# Patient Record
Sex: Female | Born: 1955 | Race: White | Hispanic: No | Marital: Married | Smoking: Never smoker
Health system: Southern US, Community
[De-identification: ages and names within clinical notes are randomized; demographics above are authoritative.]

## PROBLEM LIST (undated history)

## (undated) DIAGNOSIS — E049 Nontoxic goiter, unspecified: Secondary | ICD-10-CM

## (undated) DIAGNOSIS — K439 Ventral hernia without obstruction or gangrene: Secondary | ICD-10-CM

## (undated) DIAGNOSIS — F419 Anxiety disorder, unspecified: Secondary | ICD-10-CM

## (undated) DIAGNOSIS — M1990 Unspecified osteoarthritis, unspecified site: Secondary | ICD-10-CM

## (undated) DIAGNOSIS — I10 Essential (primary) hypertension: Secondary | ICD-10-CM

## (undated) DIAGNOSIS — C55 Malignant neoplasm of uterus, part unspecified: Secondary | ICD-10-CM

## (undated) DIAGNOSIS — J42 Unspecified chronic bronchitis: Secondary | ICD-10-CM

## (undated) DIAGNOSIS — J189 Pneumonia, unspecified organism: Secondary | ICD-10-CM

## (undated) DIAGNOSIS — Z8739 Personal history of other diseases of the musculoskeletal system and connective tissue: Secondary | ICD-10-CM

## (undated) DIAGNOSIS — Z87442 Personal history of urinary calculi: Secondary | ICD-10-CM

## (undated) DIAGNOSIS — Z862 Personal history of diseases of the blood and blood-forming organs and certain disorders involving the immune mechanism: Secondary | ICD-10-CM

## (undated) DIAGNOSIS — E119 Type 2 diabetes mellitus without complications: Secondary | ICD-10-CM

## (undated) DIAGNOSIS — J45909 Unspecified asthma, uncomplicated: Secondary | ICD-10-CM

## (undated) DIAGNOSIS — E039 Hypothyroidism, unspecified: Secondary | ICD-10-CM

## (undated) DIAGNOSIS — E785 Hyperlipidemia, unspecified: Secondary | ICD-10-CM

## (undated) DIAGNOSIS — I2699 Other pulmonary embolism without acute cor pulmonale: Secondary | ICD-10-CM

## (undated) HISTORY — DX: Anxiety disorder, unspecified: F41.9

## (undated) HISTORY — DX: Hyperlipidemia, unspecified: E78.5

## (undated) HISTORY — PX: TOTAL ABDOMINAL HYSTERECTOMY: SHX209

## (undated) HISTORY — PX: TONSILLECTOMY: SUR1361

## (undated) HISTORY — DX: Essential (primary) hypertension: I10

## (undated) HISTORY — PX: DILATION AND CURETTAGE OF UTERUS: SHX78

---

## 1998-04-02 DIAGNOSIS — C55 Malignant neoplasm of uterus, part unspecified: Secondary | ICD-10-CM

## 1998-04-02 HISTORY — DX: Malignant neoplasm of uterus, part unspecified: C55

## 1998-04-28 ENCOUNTER — Encounter: Admission: RE | Admit: 1998-04-28 | Discharge: 1998-04-28 | Payer: Self-pay | Admitting: Sports Medicine

## 1998-04-28 ENCOUNTER — Ambulatory Visit (HOSPITAL_COMMUNITY): Admission: RE | Admit: 1998-04-28 | Discharge: 1998-04-28 | Payer: Self-pay | Admitting: Sports Medicine

## 1998-05-03 ENCOUNTER — Encounter: Admission: RE | Admit: 1998-05-03 | Discharge: 1998-05-03 | Payer: Self-pay | Admitting: Family Medicine

## 1998-06-06 ENCOUNTER — Encounter: Admission: RE | Admit: 1998-06-06 | Discharge: 1998-09-04 | Payer: Self-pay | Admitting: Sports Medicine

## 1998-07-14 ENCOUNTER — Encounter: Admission: RE | Admit: 1998-07-14 | Discharge: 1998-07-14 | Payer: Self-pay | Admitting: Sports Medicine

## 1998-10-13 ENCOUNTER — Encounter: Admission: RE | Admit: 1998-10-13 | Discharge: 1998-10-13 | Payer: Self-pay | Admitting: Sports Medicine

## 1998-12-08 ENCOUNTER — Encounter: Admission: RE | Admit: 1998-12-08 | Discharge: 1998-12-08 | Payer: Self-pay | Admitting: Sports Medicine

## 2000-01-04 ENCOUNTER — Encounter: Admission: RE | Admit: 2000-01-04 | Discharge: 2000-01-04 | Payer: Self-pay | Admitting: Family Medicine

## 2000-03-02 ENCOUNTER — Encounter: Payer: Self-pay | Admitting: Emergency Medicine

## 2000-03-02 ENCOUNTER — Emergency Department (HOSPITAL_COMMUNITY): Admission: EM | Admit: 2000-03-02 | Discharge: 2000-03-02 | Payer: Self-pay | Admitting: Emergency Medicine

## 2000-06-05 ENCOUNTER — Encounter: Admission: RE | Admit: 2000-06-05 | Discharge: 2000-06-05 | Payer: Self-pay | Admitting: Family Medicine

## 2000-06-07 ENCOUNTER — Encounter: Admission: RE | Admit: 2000-06-07 | Discharge: 2000-06-07 | Payer: Self-pay | Admitting: Family Medicine

## 2000-06-12 ENCOUNTER — Emergency Department (HOSPITAL_COMMUNITY): Admission: EM | Admit: 2000-06-12 | Discharge: 2000-06-12 | Payer: Self-pay | Admitting: Emergency Medicine

## 2000-06-12 ENCOUNTER — Encounter: Payer: Self-pay | Admitting: Emergency Medicine

## 2000-06-14 ENCOUNTER — Encounter: Admission: RE | Admit: 2000-06-14 | Discharge: 2000-06-14 | Payer: Self-pay | Admitting: Family Medicine

## 2000-06-27 ENCOUNTER — Encounter: Admission: RE | Admit: 2000-06-27 | Discharge: 2000-06-27 | Payer: Self-pay | Admitting: Sports Medicine

## 2000-07-09 ENCOUNTER — Encounter: Admission: RE | Admit: 2000-07-09 | Discharge: 2000-07-09 | Payer: Self-pay | Admitting: Family Medicine

## 2000-07-16 ENCOUNTER — Encounter: Admission: RE | Admit: 2000-07-16 | Discharge: 2000-07-16 | Payer: Self-pay | Admitting: Family Medicine

## 2000-07-22 ENCOUNTER — Encounter: Admission: RE | Admit: 2000-07-22 | Discharge: 2000-07-22 | Payer: Self-pay | Admitting: Family Medicine

## 2000-07-31 ENCOUNTER — Encounter (INDEPENDENT_AMBULATORY_CARE_PROVIDER_SITE_OTHER): Payer: Self-pay | Admitting: *Deleted

## 2000-08-01 ENCOUNTER — Encounter: Admission: RE | Admit: 2000-08-01 | Discharge: 2000-08-01 | Payer: Self-pay | Admitting: Sports Medicine

## 2000-08-22 ENCOUNTER — Encounter: Payer: Self-pay | Admitting: Sports Medicine

## 2000-08-22 ENCOUNTER — Encounter: Admission: RE | Admit: 2000-08-22 | Discharge: 2000-08-22 | Payer: Self-pay | Admitting: Sports Medicine

## 2000-09-12 ENCOUNTER — Encounter: Admission: RE | Admit: 2000-09-12 | Discharge: 2000-09-12 | Payer: Self-pay | Admitting: Sports Medicine

## 2000-10-10 ENCOUNTER — Encounter: Admission: RE | Admit: 2000-10-10 | Discharge: 2000-10-10 | Payer: Self-pay | Admitting: Family Medicine

## 2000-11-27 ENCOUNTER — Encounter: Admission: RE | Admit: 2000-11-27 | Discharge: 2000-11-27 | Payer: Self-pay | Admitting: *Deleted

## 2000-12-05 ENCOUNTER — Encounter: Admission: RE | Admit: 2000-12-05 | Discharge: 2000-12-05 | Payer: Self-pay | Admitting: Sports Medicine

## 2001-03-28 ENCOUNTER — Encounter: Admission: RE | Admit: 2001-03-28 | Discharge: 2001-03-28 | Payer: Self-pay | Admitting: Family Medicine

## 2001-05-23 ENCOUNTER — Encounter: Admission: RE | Admit: 2001-05-23 | Discharge: 2001-05-23 | Payer: Self-pay | Admitting: Sports Medicine

## 2001-07-03 ENCOUNTER — Encounter: Admission: RE | Admit: 2001-07-03 | Discharge: 2001-07-03 | Payer: Self-pay | Admitting: Family Medicine

## 2001-07-04 ENCOUNTER — Encounter: Admission: RE | Admit: 2001-07-04 | Discharge: 2001-07-04 | Payer: Self-pay | Admitting: Family Medicine

## 2002-03-11 ENCOUNTER — Encounter: Admission: RE | Admit: 2002-03-11 | Discharge: 2002-03-11 | Payer: Self-pay | Admitting: Family Medicine

## 2002-10-16 ENCOUNTER — Encounter: Admission: RE | Admit: 2002-10-16 | Discharge: 2002-10-16 | Payer: Self-pay | Admitting: Family Medicine

## 2002-11-19 ENCOUNTER — Encounter: Admission: RE | Admit: 2002-11-19 | Discharge: 2002-11-19 | Payer: Self-pay | Admitting: Family Medicine

## 2002-12-03 ENCOUNTER — Encounter: Admission: RE | Admit: 2002-12-03 | Discharge: 2002-12-03 | Payer: Self-pay | Admitting: Sports Medicine

## 2003-01-14 ENCOUNTER — Encounter: Admission: RE | Admit: 2003-01-14 | Discharge: 2003-01-14 | Payer: Self-pay | Admitting: Sports Medicine

## 2003-02-18 ENCOUNTER — Encounter: Admission: RE | Admit: 2003-02-18 | Discharge: 2003-02-18 | Payer: Self-pay | Admitting: Sports Medicine

## 2003-03-18 ENCOUNTER — Encounter: Admission: RE | Admit: 2003-03-18 | Discharge: 2003-03-18 | Payer: Self-pay | Admitting: Sports Medicine

## 2003-04-15 ENCOUNTER — Encounter: Admission: RE | Admit: 2003-04-15 | Discharge: 2003-04-15 | Payer: Self-pay | Admitting: Sports Medicine

## 2003-09-10 ENCOUNTER — Encounter: Admission: RE | Admit: 2003-09-10 | Discharge: 2003-09-10 | Payer: Self-pay | Admitting: Sports Medicine

## 2003-09-30 ENCOUNTER — Encounter: Admission: RE | Admit: 2003-09-30 | Discharge: 2003-09-30 | Payer: Self-pay | Admitting: Sports Medicine

## 2003-11-04 ENCOUNTER — Encounter: Admission: RE | Admit: 2003-11-04 | Discharge: 2003-11-04 | Payer: Self-pay | Admitting: Sports Medicine

## 2004-01-06 ENCOUNTER — Ambulatory Visit: Payer: Self-pay | Admitting: Sports Medicine

## 2004-04-06 ENCOUNTER — Emergency Department (HOSPITAL_COMMUNITY): Admission: EM | Admit: 2004-04-06 | Discharge: 2004-04-06 | Payer: Self-pay | Admitting: Emergency Medicine

## 2004-04-13 ENCOUNTER — Ambulatory Visit: Payer: Self-pay | Admitting: Sports Medicine

## 2004-05-04 ENCOUNTER — Ambulatory Visit: Payer: Self-pay | Admitting: Sports Medicine

## 2005-02-15 ENCOUNTER — Ambulatory Visit: Payer: Self-pay | Admitting: Sports Medicine

## 2005-03-01 ENCOUNTER — Ambulatory Visit: Payer: Self-pay | Admitting: Sports Medicine

## 2005-06-27 ENCOUNTER — Emergency Department (HOSPITAL_COMMUNITY): Admission: EM | Admit: 2005-06-27 | Discharge: 2005-06-28 | Payer: Self-pay | Admitting: Emergency Medicine

## 2005-06-29 ENCOUNTER — Ambulatory Visit: Payer: Self-pay | Admitting: Family Medicine

## 2005-07-11 ENCOUNTER — Encounter (INDEPENDENT_AMBULATORY_CARE_PROVIDER_SITE_OTHER): Payer: Self-pay | Admitting: *Deleted

## 2006-01-17 ENCOUNTER — Encounter (INDEPENDENT_AMBULATORY_CARE_PROVIDER_SITE_OTHER): Payer: Self-pay | Admitting: *Deleted

## 2006-04-05 ENCOUNTER — Encounter (INDEPENDENT_AMBULATORY_CARE_PROVIDER_SITE_OTHER): Payer: Self-pay | Admitting: *Deleted

## 2006-04-05 DIAGNOSIS — M545 Low back pain, unspecified: Secondary | ICD-10-CM | POA: Insufficient documentation

## 2006-04-05 DIAGNOSIS — K219 Gastro-esophageal reflux disease without esophagitis: Secondary | ICD-10-CM | POA: Insufficient documentation

## 2006-04-11 ENCOUNTER — Ambulatory Visit: Payer: Self-pay | Admitting: Sports Medicine

## 2006-04-11 LAB — CONVERTED CEMR LAB
Albumin: 4.7 g/dL (ref 3.5–5.2)
CO2: 24 meq/L (ref 19–32)
Cholesterol: 163 mg/dL (ref 0–200)
Glucose, Bld: 224 mg/dL — ABNORMAL HIGH (ref 70–99)
Potassium: 4.3 meq/L (ref 3.5–5.3)
Sodium: 139 meq/L (ref 135–145)
Total Protein: 7.7 g/dL (ref 6.0–8.3)
Triglycerides: 195 mg/dL — ABNORMAL HIGH (ref <150)

## 2006-05-30 DIAGNOSIS — E049 Nontoxic goiter, unspecified: Secondary | ICD-10-CM | POA: Insufficient documentation

## 2006-05-30 DIAGNOSIS — I10 Essential (primary) hypertension: Secondary | ICD-10-CM

## 2006-05-30 DIAGNOSIS — E118 Type 2 diabetes mellitus with unspecified complications: Secondary | ICD-10-CM | POA: Insufficient documentation

## 2006-05-30 DIAGNOSIS — F329 Major depressive disorder, single episode, unspecified: Secondary | ICD-10-CM | POA: Insufficient documentation

## 2006-05-30 DIAGNOSIS — E1169 Type 2 diabetes mellitus with other specified complication: Secondary | ICD-10-CM | POA: Insufficient documentation

## 2006-05-30 DIAGNOSIS — F3289 Other specified depressive episodes: Secondary | ICD-10-CM | POA: Insufficient documentation

## 2006-05-30 DIAGNOSIS — E785 Hyperlipidemia, unspecified: Secondary | ICD-10-CM | POA: Insufficient documentation

## 2006-05-30 DIAGNOSIS — F411 Generalized anxiety disorder: Secondary | ICD-10-CM

## 2006-05-31 ENCOUNTER — Encounter (INDEPENDENT_AMBULATORY_CARE_PROVIDER_SITE_OTHER): Payer: Self-pay | Admitting: *Deleted

## 2006-06-01 ENCOUNTER — Emergency Department (HOSPITAL_COMMUNITY): Admission: EM | Admit: 2006-06-01 | Discharge: 2006-06-01 | Payer: Self-pay | Admitting: Family Medicine

## 2006-06-04 ENCOUNTER — Ambulatory Visit: Payer: Self-pay | Admitting: Family Medicine

## 2006-06-04 ENCOUNTER — Telehealth (INDEPENDENT_AMBULATORY_CARE_PROVIDER_SITE_OTHER): Payer: Self-pay | Admitting: *Deleted

## 2006-07-11 ENCOUNTER — Ambulatory Visit: Payer: Self-pay | Admitting: Sports Medicine

## 2006-08-22 ENCOUNTER — Ambulatory Visit: Payer: Self-pay | Admitting: Sports Medicine

## 2006-08-22 LAB — CONVERTED CEMR LAB: Hgb A1c MFr Bld: 8.7 %

## 2007-05-12 ENCOUNTER — Encounter: Payer: Self-pay | Admitting: *Deleted

## 2007-05-23 ENCOUNTER — Emergency Department (HOSPITAL_COMMUNITY): Admission: EM | Admit: 2007-05-23 | Discharge: 2007-05-23 | Payer: Self-pay | Admitting: Emergency Medicine

## 2007-05-29 ENCOUNTER — Ambulatory Visit: Payer: Self-pay | Admitting: Sports Medicine

## 2007-05-30 ENCOUNTER — Telehealth (INDEPENDENT_AMBULATORY_CARE_PROVIDER_SITE_OTHER): Payer: Self-pay | Admitting: *Deleted

## 2007-06-26 ENCOUNTER — Ambulatory Visit: Payer: Self-pay | Admitting: Sports Medicine

## 2007-06-26 LAB — CONVERTED CEMR LAB
ALT: 25 U/L (ref 0–35)
AST: 27 U/L (ref 0–37)
Albumin: 4.7 g/dL (ref 3.5–5.2)
Alkaline Phosphatase: 55 U/L (ref 39–117)
BUN: 13 mg/dL (ref 6–23)
CO2: 23 meq/L (ref 19–32)
Calcium: 9.3 mg/dL (ref 8.4–10.5)
Chloride: 98 meq/L (ref 96–112)
Cholesterol: 141 mg/dL (ref 0–200)
Creatinine, Ser: 0.91 mg/dL (ref 0.40–1.20)
Glucose, Bld: 231 mg/dL — ABNORMAL HIGH (ref 70–99)
HDL: 46 mg/dL (ref >39)
Hgb A1c MFr Bld: 9.2 %
LDL Cholesterol: 58 mg/dL (ref 0–99)
Potassium: 4.4 meq/L (ref 3.5–5.3)
Sodium: 137 meq/L (ref 135–145)
TSH: 0.894 u[IU]/mL (ref 0.350–5.50)
Total Bilirubin: 0.7 mg/dL (ref 0.3–1.2)
Total CHOL/HDL Ratio: 3.1
Total Protein: 7.3 g/dL (ref 6.0–8.3)
Triglycerides: 183 mg/dL — ABNORMAL HIGH (ref <150)
VLDL: 37 mg/dL (ref 0–40)

## 2007-08-12 ENCOUNTER — Encounter: Payer: Self-pay | Admitting: *Deleted

## 2007-10-06 ENCOUNTER — Telehealth: Payer: Self-pay | Admitting: *Deleted

## 2007-10-06 ENCOUNTER — Ambulatory Visit: Payer: Self-pay | Admitting: Family Medicine

## 2007-10-16 ENCOUNTER — Ambulatory Visit: Payer: Self-pay | Admitting: Sports Medicine

## 2007-10-16 LAB — CONVERTED CEMR LAB
Bilirubin Urine: NEGATIVE
Hgb A1c MFr Bld: 8.6 %
Urobilinogen, UA: 0.2
pH: 7

## 2007-10-17 ENCOUNTER — Telehealth: Payer: Self-pay | Admitting: *Deleted

## 2007-12-11 ENCOUNTER — Ambulatory Visit: Payer: Self-pay | Admitting: Sports Medicine

## 2008-03-09 ENCOUNTER — Ambulatory Visit: Payer: Self-pay | Admitting: Family Medicine

## 2008-06-10 ENCOUNTER — Ambulatory Visit: Payer: Self-pay | Admitting: Family Medicine

## 2008-06-10 LAB — CONVERTED CEMR LAB
Bilirubin Urine: NEGATIVE
Blood in Urine, dipstick: NEGATIVE
Glucose, Urine, Semiquant: NEGATIVE
Protein, U semiquant: NEGATIVE
Specific Gravity, Urine: 1.02
WBC, UA: >20 cells/hpf
pH: 5.5

## 2008-06-30 ENCOUNTER — Encounter: Payer: Self-pay | Admitting: Family Medicine

## 2008-07-16 ENCOUNTER — Ambulatory Visit: Payer: Self-pay | Admitting: Family Medicine

## 2008-07-16 ENCOUNTER — Encounter: Payer: Self-pay | Admitting: Family Medicine

## 2008-07-16 LAB — CONVERTED CEMR LAB
BUN: 23 mg/dL (ref 6–23)
Chloride: 100 meq/L (ref 96–112)
Creatinine, Ser: 1.12 mg/dL (ref 0.40–1.20)
Glucose, Bld: 67 mg/dL — ABNORMAL LOW (ref 70–99)
LDL Cholesterol: 90 mg/dL (ref 0–99)
TSH: 0.919 microintl units/mL (ref 0.350–4.500)
Triglycerides: 118 mg/dL (ref <150)

## 2008-07-20 ENCOUNTER — Encounter: Payer: Self-pay | Admitting: Family Medicine

## 2008-07-23 ENCOUNTER — Encounter: Payer: Self-pay | Admitting: Family Medicine

## 2008-07-23 ENCOUNTER — Ambulatory Visit: Payer: Self-pay | Admitting: Family Medicine

## 2008-07-23 ENCOUNTER — Telehealth: Payer: Self-pay | Admitting: *Deleted

## 2008-07-23 LAB — CONVERTED CEMR LAB
Bilirubin Urine: NEGATIVE
Glucose, Urine, Semiquant: NEGATIVE
Ketones, urine, test strip: NEGATIVE
Specific Gravity, Urine: 1.015
pH: 5.5

## 2008-08-06 ENCOUNTER — Encounter: Payer: Self-pay | Admitting: Family Medicine

## 2008-08-20 ENCOUNTER — Encounter: Payer: Self-pay | Admitting: Family Medicine

## 2008-10-01 ENCOUNTER — Ambulatory Visit: Payer: Self-pay | Admitting: Family Medicine

## 2008-10-01 ENCOUNTER — Telehealth: Payer: Self-pay | Admitting: Family Medicine

## 2008-10-02 ENCOUNTER — Ambulatory Visit: Payer: Self-pay | Admitting: Family Medicine

## 2008-10-02 ENCOUNTER — Inpatient Hospital Stay (HOSPITAL_COMMUNITY): Admission: EM | Admit: 2008-10-02 | Discharge: 2008-10-08 | Payer: Self-pay | Admitting: Emergency Medicine

## 2008-10-02 ENCOUNTER — Encounter: Payer: Self-pay | Admitting: Family Medicine

## 2008-10-02 DIAGNOSIS — I2782 Chronic pulmonary embolism: Secondary | ICD-10-CM | POA: Insufficient documentation

## 2008-10-08 ENCOUNTER — Encounter: Payer: Self-pay | Admitting: Family Medicine

## 2008-10-09 ENCOUNTER — Telehealth: Payer: Self-pay | Admitting: Sports Medicine

## 2008-10-10 ENCOUNTER — Emergency Department (HOSPITAL_COMMUNITY): Admission: EM | Admit: 2008-10-10 | Discharge: 2008-10-10 | Payer: Self-pay | Admitting: Emergency Medicine

## 2008-10-11 ENCOUNTER — Ambulatory Visit: Payer: Self-pay | Admitting: Family Medicine

## 2008-10-11 LAB — CONVERTED CEMR LAB

## 2008-10-14 ENCOUNTER — Ambulatory Visit: Payer: Self-pay | Admitting: Family Medicine

## 2008-10-14 ENCOUNTER — Telehealth: Payer: Self-pay | Admitting: Family Medicine

## 2008-10-14 ENCOUNTER — Encounter: Payer: Self-pay | Admitting: Family Medicine

## 2008-10-14 LAB — CONVERTED CEMR LAB
CO2: 25 meq/L (ref 19–32)
Chloride: 103 meq/L (ref 96–112)
INR: 3.1
Sodium: 139 meq/L (ref 135–145)

## 2008-10-18 ENCOUNTER — Ambulatory Visit: Payer: Self-pay | Admitting: Family Medicine

## 2008-10-25 ENCOUNTER — Ambulatory Visit: Payer: Self-pay | Admitting: Family Medicine

## 2008-10-25 LAB — CONVERTED CEMR LAB: INR: 2.6

## 2008-11-08 ENCOUNTER — Ambulatory Visit: Payer: Self-pay | Admitting: Family Medicine

## 2008-11-08 LAB — CONVERTED CEMR LAB: INR: 2.3

## 2008-11-15 ENCOUNTER — Encounter: Payer: Self-pay | Admitting: Family Medicine

## 2008-11-15 ENCOUNTER — Ambulatory Visit: Payer: Self-pay | Admitting: Family Medicine

## 2008-11-15 LAB — CONVERTED CEMR LAB: TSH: 0.043 microintl units/mL — ABNORMAL LOW (ref 0.350–4.500)

## 2008-11-19 ENCOUNTER — Encounter: Admission: RE | Admit: 2008-11-19 | Discharge: 2008-11-19 | Payer: Self-pay | Admitting: Family Medicine

## 2008-11-19 ENCOUNTER — Encounter: Payer: Self-pay | Admitting: Family Medicine

## 2008-11-19 ENCOUNTER — Telehealth: Payer: Self-pay | Admitting: Family Medicine

## 2008-11-22 ENCOUNTER — Telehealth: Payer: Self-pay | Admitting: Family Medicine

## 2008-11-23 ENCOUNTER — Telehealth (INDEPENDENT_AMBULATORY_CARE_PROVIDER_SITE_OTHER): Payer: Self-pay | Admitting: *Deleted

## 2008-11-26 ENCOUNTER — Encounter: Payer: Self-pay | Admitting: Family Medicine

## 2008-11-26 ENCOUNTER — Telehealth: Payer: Self-pay | Admitting: Family Medicine

## 2008-11-26 ENCOUNTER — Ambulatory Visit: Payer: Self-pay | Admitting: Family Medicine

## 2008-11-26 LAB — CONVERTED CEMR LAB
Free T4: 0.9 ng/dL (ref 0.80–1.80)
T3, Free: 2.7 pg/mL (ref 2.3–4.2)

## 2008-11-29 ENCOUNTER — Telehealth: Payer: Self-pay | Admitting: Family Medicine

## 2008-11-29 ENCOUNTER — Telehealth: Payer: Self-pay | Admitting: *Deleted

## 2008-12-07 ENCOUNTER — Encounter: Payer: Self-pay | Admitting: Family Medicine

## 2008-12-07 ENCOUNTER — Encounter (INDEPENDENT_AMBULATORY_CARE_PROVIDER_SITE_OTHER): Payer: Self-pay | Admitting: Interventional Radiology

## 2008-12-07 ENCOUNTER — Encounter: Admission: RE | Admit: 2008-12-07 | Discharge: 2008-12-07 | Payer: Self-pay | Admitting: Family Medicine

## 2008-12-07 ENCOUNTER — Other Ambulatory Visit: Admission: RE | Admit: 2008-12-07 | Discharge: 2008-12-07 | Payer: Self-pay | Admitting: Interventional Radiology

## 2008-12-10 ENCOUNTER — Ambulatory Visit: Payer: Self-pay | Admitting: Family Medicine

## 2008-12-13 ENCOUNTER — Encounter (HOSPITAL_COMMUNITY): Admission: RE | Admit: 2008-12-13 | Discharge: 2009-02-04 | Payer: Self-pay | Admitting: Family Medicine

## 2008-12-20 ENCOUNTER — Encounter: Payer: Self-pay | Admitting: Family Medicine

## 2008-12-24 ENCOUNTER — Encounter: Payer: Self-pay | Admitting: Family Medicine

## 2008-12-24 ENCOUNTER — Ambulatory Visit: Payer: Self-pay | Admitting: Family Medicine

## 2008-12-24 LAB — CONVERTED CEMR LAB
AST: 20 units/L (ref 0–37)
Albumin: 4.7 g/dL (ref 3.5–5.2)
Alkaline Phosphatase: 56 units/L (ref 39–117)
BUN: 16 mg/dL (ref 6–23)
Calcium: 9.7 mg/dL (ref 8.4–10.5)
Chloride: 102 meq/L (ref 96–112)
Creatinine, Ser: 1.11 mg/dL (ref 0.40–1.20)
Glucose, Bld: 102 mg/dL — ABNORMAL HIGH (ref 70–99)
Potassium: 4.4 meq/L (ref 3.5–5.3)

## 2008-12-25 ENCOUNTER — Encounter: Payer: Self-pay | Admitting: Family Medicine

## 2009-01-07 ENCOUNTER — Encounter: Payer: Self-pay | Admitting: Family Medicine

## 2009-01-07 ENCOUNTER — Encounter: Admission: RE | Admit: 2009-01-07 | Discharge: 2009-01-07 | Payer: Self-pay | Admitting: Family Medicine

## 2009-01-14 ENCOUNTER — Ambulatory Visit: Payer: Self-pay | Admitting: Family Medicine

## 2009-01-14 LAB — CONVERTED CEMR LAB: INR: 2.2

## 2009-02-04 ENCOUNTER — Ambulatory Visit: Payer: Self-pay | Admitting: Family Medicine

## 2009-02-04 LAB — CONVERTED CEMR LAB: INR: 2.1

## 2009-03-04 ENCOUNTER — Ambulatory Visit: Payer: Self-pay | Admitting: Family Medicine

## 2009-03-04 LAB — CONVERTED CEMR LAB: INR: 2.6

## 2009-03-15 ENCOUNTER — Encounter: Payer: Self-pay | Admitting: Family Medicine

## 2009-03-15 ENCOUNTER — Ambulatory Visit: Payer: Self-pay | Admitting: Family Medicine

## 2009-03-17 ENCOUNTER — Telehealth: Payer: Self-pay | Admitting: Family Medicine

## 2009-03-18 ENCOUNTER — Encounter: Payer: Self-pay | Admitting: Family Medicine

## 2009-03-24 ENCOUNTER — Ambulatory Visit: Payer: Self-pay | Admitting: Family Medicine

## 2009-03-31 ENCOUNTER — Ambulatory Visit: Payer: Self-pay

## 2009-04-14 ENCOUNTER — Ambulatory Visit: Payer: Self-pay | Admitting: Family Medicine

## 2009-04-14 DIAGNOSIS — M25569 Pain in unspecified knee: Secondary | ICD-10-CM | POA: Insufficient documentation

## 2009-04-14 LAB — CONVERTED CEMR LAB
BUN: 14 mg/dL (ref 6–23)
Chloride: 101 meq/L (ref 96–112)
Creatinine, Ser: 1.02 mg/dL (ref 0.40–1.20)
Glucose, Bld: 152 mg/dL — ABNORMAL HIGH (ref 70–99)
INR: 2
Potassium: 4.3 meq/L (ref 3.5–5.3)

## 2009-04-18 ENCOUNTER — Encounter: Payer: Self-pay | Admitting: Family Medicine

## 2009-04-28 ENCOUNTER — Ambulatory Visit: Payer: Self-pay | Admitting: Family Medicine

## 2009-04-28 LAB — CONVERTED CEMR LAB: INR: 2.7

## 2009-05-19 ENCOUNTER — Ambulatory Visit: Payer: Self-pay | Admitting: Family Medicine

## 2009-05-19 DIAGNOSIS — C55 Malignant neoplasm of uterus, part unspecified: Secondary | ICD-10-CM

## 2009-05-19 LAB — CONVERTED CEMR LAB
Glucose, Urine, Semiquant: NEGATIVE
Protein, U semiquant: NEGATIVE
Specific Gravity, Urine: 1.02
Urobilinogen, UA: 0.2
Whiff Test: NEGATIVE
pH: 5.5

## 2009-05-23 ENCOUNTER — Encounter: Payer: Self-pay | Admitting: Family Medicine

## 2009-05-23 ENCOUNTER — Telehealth: Payer: Self-pay | Admitting: Family Medicine

## 2009-06-16 ENCOUNTER — Ambulatory Visit: Payer: Self-pay | Admitting: Family Medicine

## 2009-06-16 ENCOUNTER — Encounter: Payer: Self-pay | Admitting: Family Medicine

## 2009-07-14 ENCOUNTER — Ambulatory Visit: Payer: Self-pay | Admitting: Family Medicine

## 2009-08-01 ENCOUNTER — Telehealth: Payer: Self-pay | Admitting: Family Medicine

## 2009-08-11 ENCOUNTER — Ambulatory Visit: Payer: Self-pay | Admitting: Family Medicine

## 2009-08-11 LAB — CONVERTED CEMR LAB: INR: 2.7

## 2009-08-25 ENCOUNTER — Ambulatory Visit: Payer: Self-pay | Admitting: Family Medicine

## 2009-08-25 ENCOUNTER — Encounter: Payer: Self-pay | Admitting: Family Medicine

## 2009-08-25 ENCOUNTER — Telehealth: Payer: Self-pay | Admitting: Family Medicine

## 2009-08-25 DIAGNOSIS — R809 Proteinuria, unspecified: Secondary | ICD-10-CM | POA: Insufficient documentation

## 2009-08-25 LAB — CONVERTED CEMR LAB
ALT: 15 units/L (ref 0–35)
Albumin: 4.5 g/dL (ref 3.5–5.2)
Alkaline Phosphatase: 51 units/L (ref 39–117)
CO2: 25 meq/L (ref 19–32)
MCHC: 32.8 g/dL (ref 30.0–36.0)
MCV: 88.8 fL (ref 78.0–100.0)
Nitrite: POSITIVE
Platelets: 234 10*3/uL (ref 150–400)
Potassium: 4.2 meq/L (ref 3.5–5.3)
Protein, U semiquant: >=300
Sodium: 137 meq/L (ref 135–145)
Specific Gravity, Urine: 1.02
Total Bilirubin: 0.5 mg/dL (ref 0.3–1.2)
Total Protein: 7.2 g/dL (ref 6.0–8.3)
WBC Urine, dipstick: NEGATIVE
WBC: 7.3 10*3/uL (ref 4.0–10.5)

## 2009-08-26 ENCOUNTER — Telehealth: Payer: Self-pay | Admitting: *Deleted

## 2009-08-26 ENCOUNTER — Encounter: Payer: Self-pay | Admitting: Family Medicine

## 2009-08-26 ENCOUNTER — Encounter: Admission: RE | Admit: 2009-08-26 | Discharge: 2009-08-26 | Payer: Self-pay | Admitting: Family Medicine

## 2009-08-28 ENCOUNTER — Telehealth: Payer: Self-pay | Admitting: Family Medicine

## 2009-09-22 ENCOUNTER — Ambulatory Visit: Payer: Self-pay | Admitting: Family Medicine

## 2009-09-22 ENCOUNTER — Encounter: Payer: Self-pay | Admitting: Family Medicine

## 2009-09-22 LAB — CONVERTED CEMR LAB
Nitrite: NEGATIVE
Protein, U semiquant: NEGATIVE

## 2009-10-14 ENCOUNTER — Ambulatory Visit: Payer: Self-pay | Admitting: Family Medicine

## 2009-10-14 ENCOUNTER — Encounter: Payer: Self-pay | Admitting: Family Medicine

## 2009-10-14 LAB — CONVERTED CEMR LAB
Glucose, Urine, Semiquant: 250
Specific Gravity, Urine: 1.02
pH: 5

## 2009-10-26 ENCOUNTER — Encounter: Payer: Self-pay | Admitting: Family Medicine

## 2009-10-26 ENCOUNTER — Telehealth: Payer: Self-pay | Admitting: Family Medicine

## 2009-10-26 ENCOUNTER — Ambulatory Visit: Payer: Self-pay | Admitting: Family Medicine

## 2009-10-26 DIAGNOSIS — D649 Anemia, unspecified: Secondary | ICD-10-CM

## 2009-10-31 ENCOUNTER — Ambulatory Visit (HOSPITAL_COMMUNITY): Admission: RE | Admit: 2009-10-31 | Discharge: 2009-10-31 | Payer: Self-pay | Admitting: Family Medicine

## 2009-10-31 ENCOUNTER — Telehealth: Payer: Self-pay | Admitting: Family Medicine

## 2009-11-03 ENCOUNTER — Ambulatory Visit: Payer: Self-pay | Admitting: Family Medicine

## 2009-11-03 DIAGNOSIS — R319 Hematuria, unspecified: Secondary | ICD-10-CM

## 2009-11-03 LAB — CONVERTED CEMR LAB
Bilirubin Urine: NEGATIVE
Hgb A1c MFr Bld: 5.8 %
Protein, U semiquant: 100
Urobilinogen, UA: 0.2
WBC, UA: >20 cells/hpf

## 2009-11-08 ENCOUNTER — Encounter: Payer: Self-pay | Admitting: Family Medicine

## 2009-11-10 ENCOUNTER — Encounter: Payer: Self-pay | Admitting: Family Medicine

## 2009-11-10 ENCOUNTER — Ambulatory Visit: Payer: Self-pay | Admitting: Family Medicine

## 2009-11-10 LAB — CONVERTED CEMR LAB
Cholesterol: 134 mg/dL (ref 0–200)
LDL Cholesterol: 65 mg/dL (ref 0–99)
VLDL: 27 mg/dL (ref 0–40)

## 2009-11-24 ENCOUNTER — Ambulatory Visit: Payer: Self-pay | Admitting: Family Medicine

## 2009-11-24 LAB — CONVERTED CEMR LAB: INR: 2.6

## 2009-12-08 ENCOUNTER — Ambulatory Visit: Payer: Self-pay | Admitting: Family Medicine

## 2009-12-08 LAB — CONVERTED CEMR LAB: INR: 2.4

## 2009-12-23 ENCOUNTER — Ambulatory Visit: Payer: Self-pay | Admitting: Family Medicine

## 2009-12-28 ENCOUNTER — Ambulatory Visit: Payer: Self-pay | Admitting: Obstetrics and Gynecology

## 2009-12-28 ENCOUNTER — Other Ambulatory Visit: Admission: RE | Admit: 2009-12-28 | Discharge: 2009-12-28 | Payer: Self-pay | Admitting: Obstetrics and Gynecology

## 2010-01-03 ENCOUNTER — Encounter: Payer: Self-pay | Admitting: Family Medicine

## 2010-01-03 DIAGNOSIS — J45909 Unspecified asthma, uncomplicated: Secondary | ICD-10-CM | POA: Insufficient documentation

## 2010-01-13 ENCOUNTER — Ambulatory Visit: Payer: Self-pay | Admitting: Family Medicine

## 2010-01-16 ENCOUNTER — Telehealth: Payer: Self-pay | Admitting: Family Medicine

## 2010-01-17 ENCOUNTER — Telehealth: Payer: Self-pay | Admitting: Family Medicine

## 2010-01-17 ENCOUNTER — Ambulatory Visit: Payer: Self-pay | Admitting: Family Medicine

## 2010-01-17 LAB — CONVERTED CEMR LAB
Bilirubin Urine: NEGATIVE
Nitrite: NEGATIVE
Urobilinogen, UA: 0.2
Whiff Test: NEGATIVE

## 2010-01-18 ENCOUNTER — Encounter: Payer: Self-pay | Admitting: Family Medicine

## 2010-01-20 ENCOUNTER — Ambulatory Visit: Payer: Self-pay | Admitting: Family Medicine

## 2010-01-20 LAB — CONVERTED CEMR LAB: INR: 3.4

## 2010-01-21 ENCOUNTER — Emergency Department (HOSPITAL_COMMUNITY): Admission: EM | Admit: 2010-01-21 | Discharge: 2010-01-22 | Payer: Self-pay | Admitting: Emergency Medicine

## 2010-01-26 ENCOUNTER — Ambulatory Visit: Admission: RE | Admit: 2010-01-26 | Discharge: 2010-01-26 | Payer: Self-pay | Admitting: Gynecologic Oncology

## 2010-01-26 ENCOUNTER — Ambulatory Visit: Payer: Self-pay | Admitting: Family Medicine

## 2010-01-26 LAB — CONVERTED CEMR LAB: INR: 2.8

## 2010-01-30 ENCOUNTER — Telehealth: Payer: Self-pay | Admitting: *Deleted

## 2010-02-14 ENCOUNTER — Encounter: Payer: Self-pay | Admitting: Family Medicine

## 2010-02-14 ENCOUNTER — Ambulatory Visit: Payer: Self-pay | Admitting: Family Medicine

## 2010-02-14 ENCOUNTER — Ambulatory Visit (HOSPITAL_COMMUNITY)
Admission: RE | Admit: 2010-02-14 | Discharge: 2010-02-14 | Payer: Self-pay | Source: Home / Self Care | Admitting: Gynecologic Oncology

## 2010-02-14 LAB — CONVERTED CEMR LAB: INR: 1

## 2010-02-15 ENCOUNTER — Encounter: Payer: Self-pay | Admitting: Family Medicine

## 2010-02-21 ENCOUNTER — Ambulatory Visit: Payer: Self-pay | Admitting: Family Medicine

## 2010-03-07 ENCOUNTER — Ambulatory Visit: Payer: Self-pay | Admitting: Family Medicine

## 2010-03-13 ENCOUNTER — Encounter: Payer: Self-pay | Admitting: Family Medicine

## 2010-03-22 ENCOUNTER — Ambulatory Visit
Admission: RE | Admit: 2010-03-22 | Discharge: 2010-03-22 | Payer: Self-pay | Source: Home / Self Care | Attending: Gynecologic Oncology | Admitting: Gynecologic Oncology

## 2010-03-28 ENCOUNTER — Ambulatory Visit: Payer: Self-pay | Admitting: Family Medicine

## 2010-04-02 HISTORY — PX: TOTAL THYROIDECTOMY: SHX2547

## 2010-04-27 ENCOUNTER — Ambulatory Visit
Admission: RE | Admit: 2010-04-27 | Discharge: 2010-04-27 | Payer: Self-pay | Source: Home / Self Care | Attending: Family Medicine | Admitting: Family Medicine

## 2010-04-27 DIAGNOSIS — N3000 Acute cystitis without hematuria: Secondary | ICD-10-CM | POA: Insufficient documentation

## 2010-04-27 LAB — CBC
HCT: 39.6 % (ref 36.0–46.0)
Hemoglobin: 12.7 g/dL (ref 12.0–15.0)
MCV: 88 fL (ref 78.0–100.0)
Platelets: 256 10*3/uL (ref 150–400)
RBC: 4.5 MIL/uL (ref 3.87–5.11)
WBC: 8.8 10*3/uL (ref 4.0–10.5)

## 2010-04-27 LAB — URINALYSIS, ROUTINE W REFLEX MICROSCOPIC
Protein, ur: NEGATIVE mg/dL
Specific Gravity, Urine: 1.016 (ref 1.005–1.030)
Urine Glucose, Fasting: NEGATIVE mg/dL

## 2010-04-27 LAB — DIFFERENTIAL
Eosinophils Absolute: 0.2 10*3/uL (ref 0.0–0.7)
Lymphocytes Relative: 35 % (ref 12–46)
Lymphs Abs: 3 10*3/uL (ref 0.7–4.0)
Monocytes Relative: 6 % (ref 3–12)
Neutro Abs: 5 10*3/uL (ref 1.7–7.7)
Neutrophils Relative %: 57 % (ref 43–77)

## 2010-04-27 LAB — URINE MICROSCOPIC-ADD ON

## 2010-04-27 LAB — COMPREHENSIVE METABOLIC PANEL
ALT: 62 U/L — ABNORMAL HIGH (ref 0–35)
BUN: 11 mg/dL (ref 6–23)
Calcium: 9.4 mg/dL (ref 8.4–10.5)
Glucose, Bld: 83 mg/dL (ref 70–99)
Sodium: 139 mEq/L (ref 135–145)
Total Protein: 6.9 g/dL (ref 6.0–8.3)

## 2010-04-27 LAB — SURGICAL PCR SCREEN
MRSA, PCR: NEGATIVE
Staphylococcus aureus: NEGATIVE

## 2010-04-28 LAB — URINE CULTURE: Culture  Setup Time: 201201261420

## 2010-05-04 ENCOUNTER — Other Ambulatory Visit: Payer: Self-pay
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
## 2010-05-04 NOTE — Miscellaneous (Signed)
Summary: glyburide 2.5  Clinical Lists Changes  Medications: Added new medication of GLYBURIDE 2.5 MG TABS (GLYBURIDE) Take 1 by mouth two times a day for diabetes - Signed Rx of GLYBURIDE 2.5 MG TABS (GLYBURIDE) Take 1 by mouth two times a day for diabetes;  #120 x 3;  Signed;  Entered by: Donna Snooks [place]aziland MD;  Authorized by: Leyton Brownlee [place]iland MD;  Method used: Electronically to [facility]  [phone], [place], [place], [place]  [address], Ph: [phone], Fax: [phone]    Prescriptions: GLYBURIDE 2.5 MG TABS (GLYBURIDE) Take 1 by mouth two times a day for diabetes  #120 x 3   Entered and Authorized by:   Analei Whinery [place] MD   Signed by:   Hang Ammon [place] MD on 06/16/2009   Method used:   Electronically to        [facility] (retail)       [address], [address]       [place], [place]  [address]       Ph: [phone]       Fax: [phone]   RxID:   [id number]

## 2010-05-04 NOTE — Miscellaneous (Signed)
Summary: uti? vag bleeding  Clinical Lists Changes burning & itching x 2 weeks.  hx of bladder infections. (here for labs) Has been taking tylenol for cramps spotting blood. last period years ago. still has uterus. last pap was normal per pt. work in with colpo clinic md this am..[facility] RN  May 19, 2009 9:15 AM

## 2010-05-04 NOTE — Miscellaneous (Signed)
Summary: enoxaparin rx  Clinical Lists Changes  Medications: Added new medication of LOVENOX 120 MG/0.8ML SOLN (ENOXAPARIN SODIUM) 120 mg subq two times a day for anticoagulation - Signed Rx of LOVENOX 120 MG/0.8ML SOLN (ENOXAPARIN SODIUM) 120 mg subq two times a day for anticoagulation;  #60 x 1;  Signed;  Entered by: Sarah Swaziland MD;  Authorized by: Sarah [place]iland MD;  Method used: Electronically to [facility]  [phone], [place], [place], [place]  [address], Ph: [phone], Fax: [phone]    Prescriptions: LOVENOX 120 MG/0.8ML SOLN (ENOXAPARIN SODIUM) 120 mg subq two times a day for anticoagulation  #60 x 1   Entered and Authorized by:   Sarah [place]and MD   Signed by:   Sarah Swaziland MD on 02/14/2010   Method used:   Electronically to        [facility]  [address] (retail)       [address], [address]       [place], [place]  [address]       Ph: [phone]       Fax: [phone]   RxID:   [id number]

## 2010-05-04 NOTE — Progress Notes (Signed)
Summary: triage  Phone Note Call from Patient Call back at Home Phone [phone]   Caller: Patient Summary of Call: pt passing clots & on blood thinner Initial call taken by: De Nurse,  October 26, 2009 3:04 PM  Follow-up for Phone Call        started bleeding when she started taking the macrobid. concerned about the clots. spoke with Dr. Jennette Kettle. she will come in now for INR Follow-up by: Golden Circle RN,  October 26, 2009 3:12 PM

## 2010-05-04 NOTE — Progress Notes (Signed)
Summary: wants to discuss diagnosis  Phone Note Call from Patient   Caller: Patient Call For: [phone] Summary of Call: Ms. Allison [name] just found out she has cancer.  She is quite distraught and need to see you this week. She is [facility] th[facility], but want to see you before then.  Please her know when to come in on your schedule this week.  Initial call taken by: Abundio Miu,  January 16, 2010 9:06 AM  Follow-up for Phone Call        Attempted to call pt.  No answer- left message for pt to call me. I don't have a clinic until Thursday - same day as she has cancer center appt.  I would be happy to talk on the phone about things.  If she would rather meet face to face, we could try to get her in on Wednesday while I am precepting.  If she does not call back while I am still her today, I can try to reach her tomorrow. Follow-up by: Sarah [place]and MD,  January 16, 2010 12:09 PM  Additional Follow-up for Phone Call Additional follow up Details #1::        Pt seen when here for INR Additional Follow-up by: Sarah [place] MD,  January 26, 2010 12:54 PM    New/Updated Medications: CYCLOBENZAPRINE HCL 5 MG TABS (CYCLOBENZAPRINE HCL) Take 1-2 three times a day for muscle spasm.  Will make you sleepy Prescriptions: CYCLOBENZAPRINE HCL 5 MG TABS (CYCLOBENZAPRINE HCL) Take 1-2 three times a day for muscle spasm.  Will make you sleepy  #90 x 1   Entered and Authorized by:   Sarah [place]and MD   Signed by:   Sarah Swaziland MD on 01/26/2010   Method used:   Electronically to        [facility] (retail)       [address]       [place], [place]  [address]       Ph: [phone]       Fax: [phone]   RxID:   [id number]

## 2010-05-04 NOTE — Miscellaneous (Signed)
Summary: asthma dx updated  Clinical Lists Changes  Problems: Removed problem of ASTHMA, UNSPECIFIED (ICD-493.90) Added new problem of ASTHMA, PERSISTENT (ICD-493.90)

## 2010-05-04 NOTE — Progress Notes (Signed)
 Summary: Emergency Line call  Phone Note Outgoing Call Call back at [facility] Phone [phone]   Summary of Call: Daughter of pt called emergency line re: pt, 21F recently DC'ed from hospital with bilateral PE's and on coumadin .  Pt bumped her head on towel rack in bathroom and has a small red spot on head.  No bleeding, no bruising.  No headache.  States she is acting normally, talking/conversive, ambulatory and moving all 4 equally.  No change in mental status.  No LOC, no fall.  I recommended that she check on her every hour for the next 4 hours and make sure she is still arousable, acting normally, and moving all 4 extremeties.  After the first 4 hours she can check on her again in the morning and make sure she is arousable, talking, and moving all 4.  Daughter agrees to do this.  Pt has a lab appt monday and an appt with Dr. Chandra on Thursday.  Daughter will bring pt to ER if unarousable or unable to talk or walk, weak on any sides, or headache or bruising/enlargement of red spot on head. Initial call taken by: Debby Petties MD,  October 09, 2008 8:50 PM

## 2010-05-04 NOTE — Progress Notes (Signed)
Summary: needs pelvic u/s - can't afford right now  ---- Converted from flag ---- ---- 05/23/2009 9:21 AM, Gladstone Pih wrote: Dr Jennette Kettle you saw this pt in colpo clinic and referred her for this Transvag U/S, Pt lost ins do to husband being layed off, and is in the middle loop hole, does not qualify for [facility] or Medicaid.  pt is looking into other options but unable to self pay at this time. advised Pt to call us when she is able to continue with testing.  Voiced understanding.Marland KitchenMarland KitchenEbbie Ridge ------------------------------       Additional Follow-up for Phone Call Additional follow up Details #2::    will notify her CP Dr Swaziland and they can do follow up US when she gets her insurance back  Denny Levy MD  May 23, 2009 9:44 AM

## 2010-05-04 NOTE — Miscellaneous (Signed)
Summary: ok to use generic?  Clinical Lists Changes [facility] [place] asked if ok to use generic pyridium [phone]. resnd with the generic spelled out.Golden Circle RN  January 18, 2010 9:44 AM  Medications: Changed medication from PYRIDIUM 100 MG TABS (PHENAZOPYRIDINE HCL) one tablet three times a day for painful urination to PYRIDIUM 100 MG TABS (PHENAZOPYRIDINE HCL) one tablet three times a day for painful urination - Signed Rx of PYRIDIUM 100 MG TABS (PHENAZOPYRIDINE HCL) one tablet three times a day for painful urination;  #20 x 0;  Signed;  Entered by: Frederik Standley [place] MD;  Authorized by: Dana Dorner [place] MD;  Method used: Electronically to [facility]  [phone], [place], [place], [place]  [address], Ph: [phone], Fax: [phone]    Prescriptions: PYRIDIUM 100 MG TABS (PHENAZOPYRIDINE HCL) one tablet three times a day for painful urination  #20 x 0   Entered and Authorized by:   Kanaya Gunnarson [place] MD   Signed by:   Raquel Sayres [place] MD on 01/18/2010   Method used:   [facility]ronically to        [facility]  [address] (retail)       [address], [address]       [place], [place]  [address]       Ph: [phone]       Fax: [phone]   RxID:   [id number]

## 2010-05-04 NOTE — Progress Notes (Signed)
Summary: triage  Phone Note Call from Patient Call back at Home Phone [phone]   Caller: Patient Summary of Call: thinks she has a UTI Initial call taken by: De Nurse,  January 17, 2010 8:47 AM  Follow-up for Phone Call        states she has been cramping since endometrial BX. now has pain & burning with urination. to come now for work in appt Follow-up by: Golden Circle RN,  January 17, 2010 8:51 AM

## 2010-05-04 NOTE — Miscellaneous (Signed)
Summary: referral for gyn  Clinical Lists Changes  Orders: Added new Referral order of Gynecologic Referral (Gyn) - Signed

## 2010-05-04 NOTE — Progress Notes (Signed)
Summary: Test Results and Pain Meds  Phone Note Call from Patient Call back at Home Phone [phone]   Summary of Call: Pt call for results of imaging she had done yesterday. She also states that she is having a significant amount of pain in her lower abdomen, back and left side and would like for some pian meds to be called into [facility]. Message to MD Initial call taken by: Garen Grams LPN,  Aug 26, 2009 10:23 AM  Follow-up for Phone Call        labs all normal. xray without evidence of stone. will send rx for ultram; however, if she is that bad off, may need CT scan to look for stone, because she shouldnt have that much pain just from UTI.  Follow-up by: Lequita Asal  MD,  Aug 26, 2009 10:51 AM  Additional Follow-up for Phone Call Additional follow up Details #1::        Patient informed of above, expressed understanding. Additional Follow-up by: Garen Grams LPN,  Aug 26, 2009 11:38 AM    New/Updated Medications: ULTRAM 50 MG TABS (TRAMADOL HCL) one tab by mouth q8 hours as needed for pain. Prescriptions: ULTRAM 50 MG TABS (TRAMADOL HCL) one tab by mouth q8 hours as needed for pain.  #20 x 0   Entered and Authorized by:   Lequita Asal  MD   Signed by:   Lequita Asal  MD on 08/26/2009   Method used:   [facility]        [facility] ([facility]ail)       [address]       [place], [place]  [address]       Ph: [phone] or [phone]       Fax: [phone]   RxID:   [id number]

## 2010-05-04 NOTE — Assessment & Plan Note (Signed)
Summary: bleeding-see notes/Bunkie/jordan   Vital Signs:  Patient profile:   55 year old female Height:      64.5 inches Weight:      272 pounds BMI:     46.13 Temp:     98.8 degrees F oral Pulse rate:   152 / minute BP sitting:   131 / 77  (left arm) Cuff size:   large  Vitals Entered By: Tessie Fass CMA (October 26, 2009 4:02 PM)  Serial Vital Signs/Assessments:  Time      Position  BP       Pulse  Resp  Temp     By                              85                    Doralee Albino MD  CC: see triage notes   Primary Care Provider:  Sarah [place] MD  CC:  see triage notes.  History of Present Illness: Patient is 35 and approximately 10 years post menopausal.  Today, heavy vaginal bleeding.  She was thought to have some vag bleeding last Feb but it was unclear if vag or urinary.  Recently treated with UTI. Had PE one year ago and on coumadin.  Today INR in normal range.   No fever or flank pain.  Does have some lower abd cramping.  Current Medications (verified): 1)  Advair Diskus 250-50 Mcg/dose Misc (Fluticasone-Salmeterol) .Marland Kitchen.. 1 Puff By Mouth  Twice A Day 2)  Albuterol 90 Mcg/act Aers (Albuterol) .... 2 Puffs Every 6 Hours As Needed 3)  Zyrtec 10 Mg Tabs (Cetirizine Hcl) .... Take 1 Tablet By Mouth Once A Day 4)  Singulair 10 Mg Tabs (Montelukast Sodium) .... Take One Tablet Daily 5)  Lisinopril-Hydrochlorothiazide 20-12.5 Mg Tabs (Lisinopril-Hydrochlorothiazide) .... Take 1 Tablet By Mouth Once A Day 6)  Verapamil Hcl Cr 120 Mg  Cp24 (Verapamil Hcl) .... Take One Tablet Daily 7)  Metformin Hcl 1000 Mg Tabs (Metformin Hcl) .Marland Kitchen.. 1 Tablet By Mouth Twice A Day 8)  Alprazolam 0.25 Mg  Tabs (Alprazolam) .Marland Kitchen.. 1 By Mouth Three Times A Day Prn 9)  Warfarin Sodium 5 Mg Tabs (Warfarin Sodium) .... Take As Directed 10)  Pravastatin Sodium 40 Mg Tabs (Pravastatin Sodium) .... Take 1 Tablet By Mouth At Bedtime 11)  Glyburide 2.5 Mg Tabs (Glyburide) .... Take 1 By Mouth Two Times A Day  For Diabetes 12)  Ultram 50 Mg Tabs (Tramadol Hcl) .... One Tab By Mouth Q8 Hours As Needed For Pain. 13)  Macrobid 100 Mg Caps (Nitrofurantoin Monohyd Macro) .... Take 1 Pill Every 12 Hours For 10 Days  Allergies (verified): 1)  ! Simvastatin  Physical Exam  General:  Well-developed,well-nourished,in no acute distress; alert,appropriate and cooperative throughout examination.  Once she calmed down and sat for a while pulse returned to normal Lungs:  Normal respiratory effort, chest expands symmetrically. Lungs are clear to auscultation, no crackles or wheezes. Heart:  Normal rate and regular rhythm. S1 and S2 normal without gallop, murmur, click, rub or other extra sounds. Abdomen:  Some lower abd tenderness Genitalia:  Blood in vag vault clearly coming from cervical os.  Informed consent obtained and endometrial biopsy attempted.  I worry that I was only in the cervical canal in that pipelle only inserted about 4 cm.  Specimen obtained and sent to lab.  Patient tolerated  procedure well.   Impression & Recommendations:  Problem # 1:  MENORRHAGIA, POSTMENOPAUSAL (ICD-627.1) I would pursue even if neg biopsy (I am uncertain if biopsy was sufficient.)  Hold coumadin till bleeding stops.  Check endometrial stripe.  Concern is for endometrial cancer.  Orders: Ultrasound (Ultrasound) [facility]- Est Level  3 (99213) Endometrial/Endocerv BX- [facility] (58100)  Problem # 2:  COUMADIN THERAPY (ICD-V58.61) therapeutic.  Hold coumadin till bleeding stops. Orders: INR/PT-[facility] (16109) [facility]- Est Level  3 (60454)  Complete Medication List: 1)  Advair Diskus 250-50 Mcg/dose Misc (Fluticasone-salmeterol) .Marland Kitchen.. 1 puff by mouth  twice a day 2)  Albuterol 90 Mcg/act Aers (Albuterol) .... 2 puffs every 6 hours as needed 3)  Zyrtec 10 Mg Tabs (Cetirizine hcl) .... Take 1 tablet by mouth once a day 4)  Singulair 10 Mg Tabs (Montelukast sodium) .... Take one tablet daily 5)  Lisinopril-hydrochlorothiazide 20-12.5 Mg  Tabs (Lisinopril-hydrochlorothiazide) .... Take 1 tablet by mouth once a day 6)  Verapamil Hcl Cr 120 Mg Cp24 (Verapamil hcl) .... Take one tablet daily 7)  Metformin Hcl 1000 Mg Tabs (Metformin hcl) .Marland Kitchen.. 1 tablet by mouth twice a day 8)  Alprazolam 0.25 Mg Tabs (Alprazolam) .Marland Kitchen.. 1 by mouth three times a day prn 9)  Warfarin Sodium 5 Mg Tabs (Warfarin sodium) .... Take as directed 10)  Pravastatin Sodium 40 Mg Tabs (Pravastatin sodium) .... Take 1 tablet by mouth at bedtime 11)  Glyburide 2.5 Mg Tabs (Glyburide) .... Take 1 by mouth two times a day for diabetes 12)  Ultram 50 Mg Tabs (Tramadol hcl) .... One tab by mouth q8 hours as needed for pain. 13)  Macrobid 100 Mg Caps (Nitrofurantoin monohyd macro) .... Take 1 pill every 12 hours for 10 days  Other Orders: Hemoglobin-[facility] (09811)  Laboratory Results   Blood Tests   Date/Time Received: October 26, 2009 4:03 PM  Date/Time Reported: October 26, 2009 4:54 PM    INR: 2.6   (Normal Range: 0.88-1.12   Therap INR: 2.0-3.5)  CBC   HGB:  13.6 g/dL   (Normal Range: 91.4-78.2 in Males, 12.0-15.0 in Females) Comments: capillary sample ...............test performed by......Marland KitchenBonnie A. [place], MLS (ASCP)cm       ANTICOAGULATION RECORD PREVIOUS REGIMEN & LAB RESULTS Anticoagulation Diagnosis:  PE 415.19 on  10/11/2008 Previous INR Goal Range:  2-3 on  10/11/2008 Previous INR:  2.1 on  10/14/2009 Previous Coumadin Dose(mg):  5MG  TABLET on  10/11/2008 Previous Regimen:  continue 7.5mg  Mon & Fri; 5mg  other days on  10/14/2009 Previous Coagulation Comments:  pt had cyst on back removed;  off Warfarin Fri 17th , Sat 18th, Sun 19th;  restarted on Mon 20th;  also on Keflex on  03/24/2009  NEW REGIMEN & LAB RESULTS Current INR: 2.6 Regimen: hold until furthur notice  Provider: [place] Repeat testing in: ----until furthur notice Other Comments: ...............test performed by......Marland KitchenBonnie A. [place], MLS (ASCP)cm   Dose has been reviewed  with patient or caretaker during this visit. Reviewed by: Dr. Leveda Anna  Anticoagulation Visit Questionnaire Coumadin dose missed/changed:  No Abnormal Bleeding Symptoms:  Yes    Bruising or bleeding from nose or gums, in urine or stool since the last visit:  having heavy vag bleeding and passing clots,  being seen by Dr. Leveda Anna - doing Endo Bx Any diet changes including alcohol intake, vegetables or greens since the last visit:  No Any illnesses or hospitalizations since the last visit:  No Any signs of clotting since the last visit (including chest discomfort,  dizziness, shortness of breath, arm tingling, slurred speech, swelling or redness in leg):  No  MEDICATIONS ADVAIR DISKUS 250-50 MCG/DOSE MISC (FLUTICASONE-SALMETEROL) 1 puff by mouth  twice a day ALBUTEROL 90 MCG/ACT AERS (ALBUTEROL) 2 puffs every 6 hours as needed ZYRTEC 10 MG TABS (CETIRIZINE HCL) Take 1 tablet by mouth once a day SINGULAIR 10 MG TABS (MONTELUKAST SODIUM) take one tablet daily LISINOPRIL-HYDROCHLOROTHIAZIDE 20-12.5 MG TABS (LISINOPRIL-HYDROCHLOROTHIAZIDE) Take 1 tablet by mouth once a day VERAPAMIL HCL CR 120 MG  CP24 (VERAPAMIL HCL) take one tablet daily METFORMIN HCL 1000 MG TABS (METFORMIN HCL) 1 tablet by mouth twice a day ALPRAZOLAM 0.25 MG  TABS (ALPRAZOLAM) 1 by mouth three times a day prn WARFARIN SODIUM 5 MG TABS (WARFARIN SODIUM) take as directed PRAVASTATIN SODIUM 40 MG TABS (PRAVASTATIN SODIUM) Take 1 tablet by mouth at bedtime GLYBURIDE 2.5 MG TABS (GLYBURIDE) Take 1 by mouth two times a day for diabetes ULTRAM 50 MG TABS (TRAMADOL HCL) one tab by mouth q8 hours as needed for pain. MACROBID 100 MG CAPS (NITROFURANTOIN MONOHYD MACRO) take 1 pill every 12 hours for 10 days

## 2010-05-04 NOTE — Letter (Signed)
Summary: Generic Letter  [facility]  [address]   [place], [place] [address]   Phone: [phone]  Fax: [phone]    04/18/2009  Allison [name] [address] [place], [place]  [address]  Dear [name], I have reviewed your labs from last week, and things look fine except your sugar was a bit high.  I know we cut back on your diabetes medicines, and I think that was fine, but we should keep an eye on this.  If you check your sugars at home, let us know if they are running high and we can adjust your medicines again.  Also, paying attention to nutrition and trying to exercise help a lot.  Your thyroid test was fine.  We will continue to follow this.  Let us know if you have any questions.  It was nice to see you last week.   Sincerely,   Ashar Lewinski [place] MD  Appended Document: Generic Letter mailed letter to pt

## 2010-05-04 NOTE — Miscellaneous (Signed)
Summary: unable to do surgery due to goiter compromising airway  Clinical Lists Changes Spoke with Dr. Laurette Schimke, gyn onc regarding Allison [name]' surgery.  They attempted her surgery this morning, but her airway was so compromised by her goiter that they were unable to procede.  They need to have the goiter removed before surgery will be safe.  They asked her to resume her coumadin and placed her on Megace 180 two times a day until surgery can be performed.  Dr. Nelly Rout felt it would be safe from an oncology standpoint to wait months on the hysterectomy if necessary.

## 2010-05-04 NOTE — Progress Notes (Signed)
Summary: phn msg  Phone Note Call from Patient Call back at Home Phone [phone]   Caller: Patient Summary of Call: wants to know when she is to stop taking Warferin - she is having hysterectomy on Nov 15th and wants to know when to stop it Initial call taken by: De Nurse,  January 30, 2010 10:42 AM  Follow-up for Phone Call        Would be fine to stop warfarin prior to surgery.  Bridging would a good idea, but I would defer to surgeons about when to start LMWH based on their assessment of bleeding risk.   Follow-up by: Sarah [place] MD,  January 31, 2010 9:37 AM  Additional Follow-up for Phone Call Additional follow up Details #1::        Called Dagmar Hait NP at [facility] and left the message. Additional Follow-up by: Dennison Nancy RN,  January 31, 2010 9:51 AM
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
## 2010-05-04 NOTE — Progress Notes (Signed)
Summary: discussed path results.  Phone Note Outgoing Call   Call placed by: Marifer Hurd Swaz[place]and MD,  October 31, 2009 11:38 AM Call placed to: Patient Summary of Call: Left message to call.  Initial call taken by: Keirra Zeimet [place] MD,  October 31, 2009 11:39 AM  Follow-up for Phone Call        PT returned call.  She is having minimal pink mucous when she wipes after urination, otherwise no bleeding.  Has ultrasound done this morning.  Legs feel achy and tired.   Plan is to restart warfarin tonight at 5 mg daily.  Pt has follow up with me THursday.  If develops bleeding again, will stop warfarin.  Will call if develops heavy bleeding. Follow-up by: Song Myre Sw[place]and MD,  October 31, 2009 12:15 PM

## 2010-05-04 NOTE — Assessment & Plan Note (Signed)
Summary: bood in urine & pain//Allison [name]   Primary Care Provider:  Sarah Swaziland MD  CC:  hematuria.  History of Present Illness: hematuria- started this a.m. dark blood. earlier this week, had L sided flank/back pain. no fever, chills, N/V. +urinary frequency, urgency, feeling of incomplete voiding, suprapubic pain.   Current Medications (verified): 1)  Advair Diskus 250-50 Mcg/dose Misc (Fluticasone-Salmeterol) .Marland Kitchen.. 1 Puff By Mouth  Twice A Day 2)  Albuterol 90 Mcg/act Aers (Albuterol) .... 2 Puffs Every 6 Hours As Needed 3)  Zyrtec 10 Mg Tabs (Cetirizine Hcl) .... Take 1 Tablet By Mouth Once A Day 4)  Singulair 10 Mg Tabs (Montelukast Sodium) .... Take One Tablet Daily 5)  Lisinopril-Hydrochlorothiazide 20-12.5 Mg Tabs (Lisinopril-Hydrochlorothiazide) .... Take 1 Tablet By Mouth Once A Day 6)  Verapamil Hcl Cr 120 Mg  Cp24 (Verapamil Hcl) .... Take One Tablet Daily 7)  Metformin Hcl 1000 Mg Tabs (Metformin Hcl) .Marland Kitchen.. 1 Tablet By Mouth Twice A Day 8)  Alprazolam 0.25 Mg  Tabs (Alprazolam) .Marland Kitchen.. 1 By Mouth Three Times A Day Prn 9)  Warfarin Sodium 5 Mg Tabs (Warfarin Sodium) .... Take As Directed 10)  Pravastatin Sodium 40 Mg Tabs (Pravastatin Sodium) .... Take 1 Tablet By Mouth At Bedtime 11)  Keflex 500 Mg Caps (Cephalexin) .... Take 1 Tab By Mouth Two Times A Day For 14 Days 12)  Glyburide 2.5 Mg Tabs (Glyburide) .... Take 1 By Mouth Two Times A Day For Diabetes  Allergies: 1)  ! Simvastatin  Physical Exam  General:  Vitals reviewed. Morbidly Obese. No distress Abdomen:  no CVA TTP. mild suprapubic TTP. no rebound or guarding.    Impression & Recommendations:  Problem # 1:  GROSS HEMATURIA (ICD-599.71) Assessment New  h/o microscopic hematuria in the past. could be 2/2 hemorrhagic cystitis vs. nephrolithiasis vs. more concening bladder or upper urinary system pathology. U/A with WBCs, RBCs, and bacteria with nitrite. will send for culture. treat with keflex x2 weeks. repeat U/A  in 4-6 weeks. INR not supratherapeutic (2.4 today). no changes. will check KUB for stone.   Her updated medication list for this problem includes:    Keflex 500 Mg Caps (Cephalexin) .Marland Kitchen... Take 1 tab by mouth two times a day for 7 days  Orders: [facility]- Est  Level 4 (18299) Radiology other (Radiology Other)  Problem # 2:  PROTEINURIA (ICD-791.0) Assessment: New would f/u after resolution of acute issue.  Other Orders: Urinalysis-[facility] (00000) Urine Culture-[facility] (37169-67893) CBC-[facility] (81017) Comp Met-[facility] (51025-85277) INR/PT-[facility] (82423)  Patient Instructions: 1)  Follow up with Dr. Swaziland in 4-6 weeks for repeat urinalysis 2)  Take [facility] for two weeks for bladder infection 3)  IF your symptoms get worse (fever, Nausea, vomiting, worsening back pain) you need to be seen sooner Prescriptions: KEFLEX 500 MG CAPS (CEPHALEXIN) Take 1 tab by mouth two times a day for 7 days  #14 x 0   Entered and Authorized by:   Lequita Asal  MD   Signed by:   Lequita Asal  MD on 08/25/2009   Method used:   Electronically to        [facility]  [address] (retail)       [address]       [place], [place]  [address]       Ph: [phone] or [phone]       Fax: [phone]   RxID:   [id number] KEFLEX 500 MG CAPS (CEPHALEXIN) Take 1 tab by mouth two times a day for  14 days  #28 x 0   Entered and Authorized by:   Lequita Asal  MD   Signed by:   Lequita Asal  MD on 08/25/2009   Method used:   [facility]        [facility] (retail)       [address]       [place], [place]  [address]       Ph: [phone] or [phone]       Fax: [phone]   RxID:   [id number]   Laboratory Results   Urine Tests  Date/Time Received: Aug 25, 2009 1:48 PM  Date/Time Reported: Aug 25, 2009 2:19 PM   Routine Urinalysis   Color: red Appearance: Cloudy Glucose: negative   (Normal Range: Negative) Bilirubin: moderate;   reflex ictotest = negative    (Normal Range: Negative) Ketone: trace (5)   (Normal Range: Negative) Spec. Gravity: 1.020   (Normal Range: 1.003-1.035) Blood: large   (Normal Range: Negative) pH: 5.5   (Normal Range: 5.0-8.0) Protein: >=300   (Normal Range: Negative) Urobilinogen: 1.0   (Normal Range: 0-1) Nitrite: positive   (Normal Range: Negative) Leukocyte Esterace: negative   (Normal Range: Negative)  Urine Microscopic WBC/HPF: 15-25 RBC/HPF: LOADED Bacteria/HPF: 3+ Epithelial/HPF: 1-5    Comments: difficult to see through RBC's ...............test performed by......Marland KitchenBonnie A. Swaziland, MLS (ASCP)cm   Blood Tests      INR: 2.4   (Normal Range: 0.88-1.12   Therap INR: 2.0-3.5)     ANTICOAGULATION RECORD PREVIOUS REGIMEN & LAB RESULTS Anticoagulation Diagnosis:  PE 415.19 on  10/11/2008 Previous INR Goal Range:  2-3 on  10/11/2008 Previous INR:  2.7 on  08/11/2009 Previous Coumadin Dose(mg):  5MG  TABLET on  10/11/2008 Previous Regimen:  continue same:  7.5 mg - M & F;  5 mg - other days on  08/11/2009 Previous Coagulation Comments:  pt had cyst on back removed;  off Warfarin Fri 17th , Sat 18th, Sun 19th;  restarted on Mon 20th;  also on Keflex on  03/24/2009  NEW REGIMEN & LAB RESULTS Current INR: 2.4 Regimen: continue same:  7.5 mg - Mon & Fri;   5 mg - other days  Provider: Lanier Prude Repeat testing in: 4 weeks Other Comments: ...............test performed by......Marland KitchenBonnie A. Swaziland, MLS (ASCP)cm   Dose has been reviewed with patient or caretaker during this visit. Reviewed by: Dr. Lanier Prude  Anticoagulation Visit Questionnaire Coumadin dose missed/changed:  No Abnormal Bleeding Symptoms:  Yes    Bruising or bleeding from nose or gums, in urine or stool since the last visit:  blood in urine, saw MD today Any diet changes including alcohol intake, vegetables or greens since the last visit:  No Any illnesses or hospitalizations since the last visit:  No Any signs of clotting since the last visit  (including chest discomfort, dizziness, shortness of breath, arm tingling, slurred speech, swelling or redness in leg):  No  MEDICATIONS ADVAIR DISKUS 250-50 MCG/DOSE MISC (FLUTICASONE-SALMETEROL) 1 puff by mouth  twice a day ALBUTEROL 90 MCG/ACT AERS (ALBUTEROL) 2 puffs every 6 hours as needed ZYRTEC 10 MG TABS (CETIRIZINE HCL) Take 1 tablet by mouth once a day SINGULAIR 10 MG TABS (MONTELUKAST SODIUM) take one tablet daily LISINOPRIL-HYDROCHLOROTHIAZIDE 20-12.5 MG TABS (LISINOPRIL-HYDROCHLOROTHIAZIDE) Take 1 tablet by mouth once a day VERAPAMIL HCL CR 120 MG  CP24 (VERAPAMIL HCL) take one tablet daily METFORMIN HCL 1000 MG TABS (METFORMIN HCL) 1 tablet by mouth twice a day ALPRAZOLAM 0.25 MG  TABS (ALPRAZOLAM) 1 by mouth three times a day prn WARFARIN SODIUM 5 MG TABS (WARFARIN SODIUM) take as directed PRAVASTATIN SODIUM 40 MG TABS (PRAVASTATIN SODIUM) Take 1 tablet by mouth at bedtime KEFLEX 500 MG CAPS (CEPHALEXIN) Take 1 tab by mouth two times a day for 7 days GLYBURIDE 2.5 MG TABS (GLYBURIDE) Take 1 by mouth two times a day for diabetes

## 2010-05-04 NOTE — Progress Notes (Signed)
Summary: pt cancelled eye appt  ---- Converted from flag ---- ---- 07/06/2009 2:51 PM, Gladstone Pih wrote: Pt cancelled apt and never resched.Marland KitchenMarland KitchenLisa  ---- 07/06/2009 11:57 AM, Niara Bunker Swaziland MD wrote: Could we check with Dr. Marlis Edelson office and see if we can get the report for her eye appt on 12/27/08? Thanks! SJ ------------------------------

## 2010-05-04 NOTE — Miscellaneous (Signed)
Summary: walk in  Clinical Lists Changes c/o req urination & pain. states she had this last month. same symptoms. 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

## 2010-05-04 NOTE — Assessment & Plan Note (Signed)
Summary: uti per pt/[place]/jordan   FLU SHOT GIVEN TODAY.Jimmy Footman, CMA  January 17, 2010 3:42 PM   Vital Signs:  Patient profile:   55 year old female Height:      64.5 inches Weight:      275 pounds BMI:     46.64 Temp:     99.0 degrees F oral Pulse rate:   112 / minute BP sitting:   124 / 84  (left arm) Cuff size:   large  Vitals Entered By: Jimmy Footman, CMA (January 17, 2010 9:40 AM) CC: uti sxs x1 week, burning & itching Is Patient Diabetic? Yes Did you bring your meter with you today? No Pain Assessment Patient in pain? yes      Type: burning   Primary Care Provider:  Sarah Swaziland MD  CC:  uti sxs x1 week and burning & itching.  History of Present Illness: 55 yo here for workin appt for dysuria  had endometrial biopsy 2 weeks ago and cramping after that, now with 1 week history of burning with urniation, urinary frequency above baseline, and some urinary hesitation.  no abdominal pain but continued pelvic cramping, no fever or flank pain.  She thinks she has a yeast infection- notes redness on labia, vaginal discharge.  Has been using monistat but no improvement.   Habits & Providers  Alcohol-Tobacco-Diet     Tobacco Status: never  Current Medications (verified): 1)  Advair Diskus 250-50 Mcg/dose Misc (Fluticasone-Salmeterol) .Marland Kitchen.. 1 Puff By Mouth  Twice A Day 2)  Albuterol 90 Mcg/act Aers (Albuterol) .... 2 Puffs Every 6 Hours As Needed 3)  Zyrtec 10 Mg Tabs (Cetirizine Hcl) .... Take 1 Tablet By Mouth Once A Day 4)  Lisinopril-Hydrochlorothiazide 20-12.5 Mg Tabs (Lisinopril-Hydrochlorothiazide) .... Take 1 Tablet By Mouth Once A Day 5)  Verapamil Hcl Cr 120 Mg  Cp24 (Verapamil Hcl) .... Take One Tablet Daily 6)  Metformin Hcl 1000 Mg Tabs (Metformin Hcl) .Marland Kitchen.. 1 Tablet By Mouth Twice A Day 7)  Alprazolam 0.25 Mg  Tabs (Alprazolam) .Marland Kitchen.. 1 By Mouth Three Times A Day Prn 8)  Warfarin Sodium 5 Mg Tabs (Warfarin Sodium) .... Take As Directed 9)  Pravastatin Sodium  40 Mg Tabs (Pravastatin Sodium) .... Take 1 Tablet By Mouth At Bedtime 10)  Glyburide 2.5 Mg Tabs (Glyburide) .... Take 1 By Mouth Two Times A Day For Diabetes 11)  Keflex 500 Mg Caps (Cephalexin) .... One Tablet Twice A Day For 7 Days 12)  Pyridium 100 Mg Tabs (Phenazopyridine Hcl) .... One Tablet Three Times A Day For Painful Urination  Allergies: 1)  ! Simvastatin 2)  ! Tramadol Hcl (Tramadol Hcl) PMH-FH-SH reviewed for relevance  Review of Systems      See HPI  Physical Exam  General:  Obese,in no acute distress; alert,appropriate and cooperative throughout examination Lungs:  Normal respiratory effort, chest expands symmetrically. Lungs are clear to auscultation, no crackles or wheezes. Heart:  Normal rate and regular rhythm. S1 and S2 normal without gallop, murmur, click, rub or other extra sounds. Abdomen:  Bowel sounds positive,abdomen soft and non-tender without masses, organomegaly or hernias noted.  no flank pa in Genitalia:  speculum  Exam:        External: normal female genitalia without lesions or masses.  erythematous labia, dry        Vagina: normal without lesions or masses        Cervix: normal without lesions or masses    Impression & Recommendations:  Problem #  1:  DYSURIA (ICD-788.1) history of u/a consistant with urinary tract infection.  Will start keflex and culture.  Patient on coumdain- last check was therpaeutic range, next check due i 3 days, no changes ot coumadine for short course abx.  No yeast on wet prep.  Discussed with patient if vaginal pain/irritation has been longstanding, may not be yeast but instead atrophic vaginitis.  Once she is complete with tx for uti and gets gyn-onc issues settled, may discuss with pcp for tx.  Her updated medication list for this problem includes:    Keflex 500 Mg Caps (Cephalexin) ..... One tablet twice a day for 7 days    Pyridium 100 Mg Tabs (Phenazopyridine hcl) ..... 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  Patient worried about undergoing hysterectomy with her chornic medical problems.  Gave reassurance of careful monitoring and that we will make plans to reduce risks with diabetes and coumadin therapy.  explained process of meeting with gyn-onc.  Patient has appointment with Dr. Laurette Schimke.    Orders: [facility]- Est Level  3 (29562)  Complete Medication List: 1)  Advair Diskus 250-50 Mcg/dose Misc (Fluticasone-salmeterol) .Marland Kitchen.. 1 puff by mouth  twice a day 2)  Albuterol 90 Mcg/act Aers (Albuterol) .... 2 puffs every 6 hours as needed 3)  Zyrtec 10 Mg Tabs (Cetirizine hcl) .... Take 1 tablet by mouth once a day 4)  Lisinopril-hydrochlorothiazide 20-12.5 Mg Tabs (Lisinopril-hydrochlorothiazide) .... Take 1 tablet by mouth once a day 5)  Verapamil Hcl Cr 120 Mg Cp24 (Verapamil hcl) .... Take one tablet daily 6)  Metformin Hcl 1000 Mg Tabs (Metformin hcl) .Marland Kitchen.. 1 tablet by mouth twice a day 7)  Alprazolam 0.25 Mg Tabs (Alprazolam) .Marland Kitchen.. 1 by mouth three times a day prn 8)  Warfarin Sodium 5 Mg Tabs (Warfarin sodium) .... Take as directed 9)  Pravastatin Sodium 40 Mg Tabs (Pravastatin sodium) .... Take 1 tablet by mouth at bedtime 10)  Glyburide 2.5 Mg Tabs (Glyburide) .... Take 1 by mouth two times a day for diabetes 11)  Keflex 500 Mg Caps (Cephalexin) .... One tablet twice a day for 7 days 12)  Pyridium 100 Mg Tabs (Phenazopyridine hcl) .... One tablet three times a day for painful urination  Other Orders: Flu Vaccine 87yrs + (13086) Admin 1st Vaccine (57846)  Patient Instructions: 1)  Medicine for UTI Keflex twice a day for 7 days 2)  Pyriudium for pain with urination 3)  drinks lots of water Prescriptions: PYRIDIUM 100 MG TABS (PHENAZOPYRIDINE HCL)  one tablet three times a day for painful urination  #20 x 0   Entered and Authorized by:   Delbert Harness MD   Signed by:   Delbert Harness MD on 01/18/2010   Method used:   Electronically to        [facility]  [address] (retail)       [address], [address]       [place], [place]  [address]       Ph: [phone]       Fax: [phone]   RxID:   [id number] KEFLEX 500 MG CAPS (CEPHALEXIN) one tablet twice a day for 7 days  #14 x 0   Entered and Authorized by:  Delbert Harness MD   Signed by:   Delbert Harness MD on 01/18/2010   Method used:   Electronically to        [facility]  [address] (retail)       [address]       [place], [place]  [address]       Ph: [phone]       Fax: [phone]   RxID:   [id number]    Orders Added: 1)  Urinalysis-[facility] [00000] 2)  Wet Prep- [facility] [87210] 3)  Flu Vaccine 39yrs + [90658] 4)  Admin 1st Vaccine [90471] 5)  Urine Culture-[facility] [[id number]] 6)  [facility]- Est Level  3 [34742]   Immunizations Administered:  Influenza Vaccine:    Vaccine Type: Fluvax 3+    Site: left deltoid    Mfr: GlaxoSmithKline    Dose: 0.5 ml    Route: IM    Given by: Jimmy Footman, CMA    Exp. Date: 09/27/2010    Lot #: [id number]    VIS given: 10/24/06 version given January 17, 2010.  Flu Vaccine Consent Questions:    Do you have a history of severe allergic reactions to this vaccine? no    Any prior history of allergic reactions to egg and/or gelatin? no    Do you have a sensitivity to the preservative Thimersol? no    Do you have a past history of Guillan-Barre Syndrome? no    Do you currently have an acute febrile illness? no    Have you ever had a severe reaction to latex? no    Vaccine information given and explained to patient? yes    Are you currently pregnant? no   Immunizations Administered:  Influenza Vaccine:    Vaccine Type: Fluvax 3+    Site: left deltoid    Mfr:  GlaxoSmithKline    Dose: 0.5 ml    Route: IM    Given by: Jimmy Footman, CMA    Exp. Date: 09/27/2010    Lot #: [id number]    VIS given: 10/24/06 version given January 17, 2010.  Flu Vaccine Consent Questions:    Do you have a history of severe allergic reactions to this vaccine? no    Any prior history of allergic reactions to egg and/or gelatin? no    Do you have a sensitivity to the preservative Thimersol? no    Do you have a past history of Guillan-Barre Syndrome? no    Do you currently have an acute febrile illness? no    Have you ever had a severe reaction to latex? no    Vaccine information given and explained to patient? yes    Are you currently pregnant? no  Laboratory Results   Urine Tests  Date/Time Received: January 17, 2010 9:52 AM  Date/Time Reported: January 17, 2010 11:07 AM   Routine Urinalysis   Color: yellow Appearance: Clear Glucose: negative   (Normal Range: Negative) Bilirubin: negative   (Normal Range: Negative) Ketone: negative   (Normal Range: Negative) Spec. Gravity: 1.020   (Normal Range: 1.003-1.035) Blood: small   (Normal Range: Negative) pH: 5.5   (Normal Range: 5.0-8.0) Protein: negative   (Normal Range: Negative) Urobilinogen: 0.2   (Normal Range: 0-1) Nitrite: negative   (Normal Range: Negative) Leukocyte Esterace: moderate   (Normal Range: Negative)  Urine Microscopic WBC/HPF: <20 RBC/HPF: occ Bacteria/HPF: trace Epithelial/HPF: 1-3 Other: 3+ amorphous    Comments: ...............test performed by......Marland KitchenBonnie A.  Swaziland, MLS (ASCP)cm  Date/Time Received: January 17, 2010 10:38 AM  Date/Time Reported: January 17, 2010 11:07 AM   Wet [place] Source: vag WBC/hpf: >20 Bacteria/hpf: 2+  Cocci Clue cells/hpf: none  Negative whiff Yeast/hpf: none Trichomonas/hpf: none Comments: ...............test performed by......Marland KitchenBonnie A. [place]aziland, MLS (ASCP)cm
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
## 2010-05-04 NOTE — Miscellaneous (Signed)
Summary: Consent Endometrial Bx  Consent Endometrial Bx   Imported By: Clydell Hakim 10/27/2009 16:38:22  _____________________________________________________________________  External Attachment:    Type:   Image     Comment:   External Document

## 2010-05-04 NOTE — Progress Notes (Signed)
  Phone Note Outgoing Call   Call placed by: Lequita Asal  MD,  Aug 28, 2009 3:23 PM Summary of Call: discussed urine culture results. patient with minimal improvement in symptoms. will d/c keflex and start macrobid.  Initial call taken by: Lequita Asal  MD,  Aug 28, 2009 3:25 PM    New/Updated Medications: MACROBID 100 MG CAPS (NITROFURANTOIN MONOHYD MACRO) one tab by mouth two times a day x7 days. Prescriptions: MACROBID 100 MG CAPS (NITROFURANTOIN MONOHYD MACRO) one tab by mouth two times a day x7 days.  #14 x 0   Entered and Authorized by:   Lequita Asal  MD   Signed by:   Lequita Asal  MD on 08/28/2009   Method used:   Electronically to        [facility]  [address] (retail)       [address]       [place], [place]  [address]       Ph: [phone] or [phone]       Fax: [phone]   RxID:   [id number]

## 2010-05-04 NOTE — Miscellaneous (Signed)
Summary: inr order  Clinical Lists Changes  Orders: Added new Test order of INR/PT-[facility] (91478) - Signed

## 2010-05-04 NOTE — Letter (Signed)
Summary: [facility]  [address]   [place], [place] [address]   Phone: [phone]  Fax: [phone]    05/23/2009  KRISTILYN COLTRANE [address] [place], [place]  [address]  Dear Ms. Lotts,   We cultured your urine and it did not grow anything out. Good news!        Sincerely,   Denny Levy MD  Appended Document: LABLetter mailed.

## 2010-05-04 NOTE — Progress Notes (Signed)
Summary: triage  Phone Note Call from Patient Call back at Home Phone [phone]   Caller: Patient Summary of Call: Pt has blood in her urine.  Can she be seen today? Initial call taken by: Clydell Hakim,  Aug 25, 2009 10:57 AM  Follow-up for Phone Call        started this am. also has low abd pain & low back pain x 1 wk. work in at 1:30 today. aware she will not be seeing her md. she has been drinking lots of water & taking OTC pain reliever Follow-up by: Golden Circle RN,  Aug 25, 2009 11:04 AM

## 2010-05-04 NOTE — Assessment & Plan Note (Signed)
Summary: uti, glycosuria  [facility] [place]  Vital Signs:  Patient profile:   55 year old female Height:      64.5 inches Weight:      274 pounds BMI:     46.47 Temp:     98.9 degrees F Pulse rate:   111 / minute BP sitting:   116 / 75  Vitals Entered By: Golden Circle RN (October 14, 2009 9:04 AM) CC: UTI   Primary Care Provider:  Sarah Swaziland MD  CC:  UTI.  History of Present Illness: Pt has been having a few chills and sweats lately. She has been urinating frequently the last 3 days. No dysuria, no burning, no smell or discharge but wants to be checked for a UTI. She is a diabetic. She says that she has not been feeling good all week so she has not tested her CBg's this week. Mentions that she has had a little back pain on the Rt.   Habits & Providers  Alcohol-Tobacco-Diet     Tobacco Status: never  Current Medications (verified): 1)  Advair Diskus 250-50 Mcg/dose Misc (Fluticasone-Salmeterol) .Marland Kitchen.. 1 Puff By Mouth  Twice A Day 2)  Albuterol 90 Mcg/act Aers (Albuterol) .... 2 Puffs Every 6 Hours As Needed 3)  Zyrtec 10 Mg Tabs (Cetirizine Hcl) .... Take 1 Tablet By Mouth Once A Day 4)  Singulair 10 Mg Tabs (Montelukast Sodium) .... Take One Tablet Daily 5)  Lisinopril-Hydrochlorothiazide 20-12.5 Mg Tabs (Lisinopril-Hydrochlorothiazide) .... Take 1 Tablet By Mouth Once A Day 6)  Verapamil Hcl Cr 120 Mg  Cp24 (Verapamil Hcl) .... Take One Tablet Daily 7)  Metformin Hcl 1000 Mg Tabs (Metformin Hcl) .Marland Kitchen.. 1 Tablet By Mouth Twice A Day 8)  Alprazolam 0.25 Mg  Tabs (Alprazolam) .Marland Kitchen.. 1 By Mouth Three Times A Day Prn 9)  Warfarin Sodium 5 Mg Tabs (Warfarin Sodium) .... Take As Directed 10)  Pravastatin Sodium 40 Mg Tabs (Pravastatin Sodium) .... Take 1 Tablet By Mouth At Bedtime 11)  Glyburide 2.5 Mg Tabs (Glyburide) .... Take 1 By Mouth Two Times A Day For Diabetes 12)  Ultram 50 Mg Tabs (Tramadol Hcl) .... One Tab By Mouth Q8 Hours As Needed For Pain. 13)  Macrobid 100 Mg Caps  (Nitrofurantoin Monohyd Macro) .... Take 1 Pill Every 12 Hours For 10 Days  Allergies (verified): 1)  ! Simvastatin  Review of Systems        vitals reviewed and pertinent negatives and positives seen in HPI   Physical Exam  General:  Well-developed,well-nourished,in no acute distress; alert,appropriate and cooperative throughout examination Abdomen:  Bowel sounds positive,abdomen soft and non-tender without masses, organomegaly or hernias noted. Msk:  no CVA tenderness   Impression & Recommendations:  Problem # 1:  UTI (ICD-599.0) Assessment New Pt was found to have a UTI. She had one recently and was treated with both Keflex and then Nitrofurantoin. She thinks the Macrobid is the one that finally worked. Told pt that she has glucose in her urine. Tested her glucose while in office. It was normal for having eaten shortly before.   Her updated medication list for this problem includes:    Macrobid 100 Mg Caps (Nitrofurantoin monohyd macro) .Marland Kitchen... Take 1 pill every 12 hours for 10 days  Orders: Urinalysis-[facility] (00000) Urine Culture-[facility] ([id number]) [facility]- Est Level  3 (42595)  Problem # 2:  GLYCOSURIA (ICD-791.5) Assessment: New Discussed with patient. Not sure why she is spilling glucose in her urine. Encouraged her to come back and  get it rechecked in about 3 weeks once her infection has cleared.   Orders: Glucose Cap-[facility] (14782) [facility]- Est Level  3 (95621)  Complete Medication List: 1)  Advair Diskus 250-50 Mcg/dose Misc (Fluticasone-salmeterol) .Marland Kitchen.. 1 puff by mouth  twice a day 2)  Albuterol 90 Mcg/act Aers (Albuterol) .... 2 puffs every 6 hours as needed 3)  Zyrtec 10 Mg Tabs (Cetirizine hcl) .... Take 1 tablet by mouth once a day 4)  Singulair 10 Mg Tabs (Montelukast sodium) .... Take one tablet daily 5)  Lisinopril-hydrochlorothiazide 20-12.5 Mg Tabs (Lisinopril-hydrochlorothiazide) .... Take 1 tablet by mouth once a day 6)  Verapamil Hcl Cr 120 Mg Cp24 (Verapamil hcl)  .... Take one tablet daily 7)  Metformin Hcl 1000 Mg Tabs (Metformin hcl) .Marland Kitchen.. 1 tablet by mouth twice a day 8)  Alprazolam 0.25 Mg Tabs (Alprazolam) .Marland Kitchen.. 1 by mouth three times a day prn 9)  Warfarin Sodium 5 Mg Tabs (Warfarin sodium) .... Take as directed 10)  Pravastatin Sodium 40 Mg Tabs (Pravastatin sodium) .... Take 1 tablet by mouth at bedtime 11)  Glyburide 2.5 Mg Tabs (Glyburide) .... Take 1 by mouth two times a day for diabetes 12)  Ultram 50 Mg Tabs (Tramadol hcl) .... One tab by mouth q8 hours as needed for pain. 13)  Macrobid 100 Mg Caps (Nitrofurantoin monohyd macro) .... Take 1 pill every 12 hours for 10 days Prescriptions: MACROBID 100 MG CAPS (NITROFURANTOIN MONOHYD MACRO) take 1 pill every 12 hours for 10 days  #20 x 1   Entered and Authorized by:   Jamie Brookes MD   Signed by:   Jamie Brookes MD on 10/14/2009   Method used:   Electronically to        [facility]  [address] (retail)       [address]       [place], [place]  [address]       Ph: [phone]       Fax: [phone]   RxID:   [id number]   Laboratory Results   Urine Tests  Date/Time Received: October 14, 2009 9:36 AM  Date/Time Reported: October 14, 2009 10:16 AM   Routine Urinalysis   Color: yellow Appearance: Clear Glucose: 250   (Normal Range: Negative) Bilirubin: negative   (Normal Range: Negative) Ketone: negative   (Normal Range: Negative) Spec. Gravity: 1.020   (Normal Range: 1.003-1.035) Blood: small   (Normal Range: Negative) pH: 5.0   (Normal Range: 5.0-8.0) Protein: trace   (Normal Range: Negative) Urobilinogen: 0.2   (Normal Range: 0-1) Nitrite: negative   (Normal Range: Negative) Leukocyte Esterace: moderate   (Normal Range: Negative)  Urine Microscopic WBC/HPF: 5-8 RBC/HPF: 0-2 Bacteria/HPF: trace Mucous/HPF: trace Epithelial/HPF: rare    Comments: urine sent for culture. 6cc spun for micro ...........test performed  by...........Marland KitchenTerese Door, CMA

## 2010-05-04 NOTE — Miscellaneous (Signed)
Summary: referral to [facility]  Clinical Lists Changes Referral for [facility] Surgery for evaluation of goiter. Sarah Swaziland MD  February 15, 2010 9:45 AM  Problems: Removed problem of DYSURIA (ICD-788.1) Removed problem of VAGINITIS, CANDIDAL (ICD-112.1) Removed problem of CERVICAL RADICULOPATHY, LEFT (ICD-723.4) Removed problem of SINUSITIS, CHRONIC, NOS (ICD-473.9) Removed problem of URINARY INCONTINENCE (ICD-788.30) Removed problem of DIASTASIS RECTI (ICD-728.84) Removed problem of ALLERGIC RHINITIS (ICD-477.9) Orders: Added new Referral order of Surgical Referral (Surgery) - Signed

## 2010-05-04 NOTE — Consult Note (Signed)
Summary: [name] MD  [name] MD   Imported By: Bradly Bienenstock 03/31/2010 12:13:57  _____________________________________________________________________  External Attachment:    Type:   Image     Comment:   External Document

## 2010-05-04 NOTE — Assessment & Plan Note (Signed)
Summary: UTI   Vital Signs:  Patient profile:   55 year old female Height:      64.5 inches Weight:      277 pounds Temp:     99.1 degrees F oral Pulse rate:   108 / minute BP sitting:   114 / 77  (left arm) Cuff size:   large  Vitals Entered By: Loralee Pacas CMA (April 27, 2010 3:31 PM) CC: abdominal pain Comments surgery cancelled due to pt having an UTI and fever   Primary Care Provider:  Feleshia Zundel [place] MD  CC:  abdominal pain.  History of Present Illness: I received a call from Dr.Rosenbaum because Ms. Shan' surgery was cancelled due to a fever and urinary signs wtih a concerning UA when she was checking in for surgery. She was told to restart her warfarin, and her surgery is being reschedlued.    Pt here because her surgery was cancelled due to fever.  Reports she is feeling anxious about surgeries anyway.  Reports pelvic pressure when she urinates since last night, no active dysuria.  + frequency.  No URI signs.  No nausea, vomiting.  +tactile fever last night?    She is wondering if it is ok to take to 2 of her Xanax at a time.  Pt feeling very anxious about upcoming surgeries, wants to have the endometrial cancer treated.     Allergies: 1)  ! Simvastatin 2)  ! Tramadol Hcl (Tramadol Hcl)  Review of Systems       see HPI  Physical Exam  General:  Obese,in no acute distress; alert,appropriate and cooperative throughout examination. Vitals noted   Impression & Recommendations:  Problem # 1:  ACUTE CYSTITIS (ICD-595.0)  Reviewed results from earlier today.  UA reveals neg nitrite and and small leuk esterase.  Will rx with cephalexin.  Return precautions reviewed.  Follow up prn Her updated medication list for this problem includes:    Keflex 500 Mg Caps (Cephalexin) ..... One tablet twice a day for 7 days    Pyridium 100 Mg Tabs (Phenazopyridine hcl) ..... One tablet three times a day for painful urination    Cephalexin 500 Mg Caps (Cephalexin) .Marland Kitchen... 1 by  mouth two times a day for 3 days.  Orders: [facility]- Est Level  2 (91478)  Problem # 2:  COUMADIN THERAPY (ICD-V58.61) Pt to restart warfarin.  We have attempted to bridge her in the past, and the lovenox cost several thousand dollars for her co-pay on her insurance plan.  So, pt is to restart her warfarin tonight at her previous dose and follow up 1 week for an INR check.  Pt will again need to hold her warfarin prior to surgery when it can be rescheduled.    Complete Medication List: 1)  Advair Diskus 250-50 Mcg/dose Misc (Fluticasone-salmeterol) .Marland Kitchen.. 1 puff by mouth  twice a day 2)  Albuterol 90 Mcg/act Aers (Albuterol) .... 2 puffs every 6 hours as needed 3)  Zyrtec 10 Mg Tabs (Cetirizine hcl) .... Take 1 tablet by mouth once a day 4)  Lisinopril-hydrochlorothiazide 20-12.5 Mg Tabs (Lisinopril-hydrochlorothiazide) .... Take 1 tablet by mouth once a day 5)  Verapamil Hcl Cr 120 Mg Cp24 (Verapamil hcl) .... Take one tablet daily 6)  Metformin Hcl 1000 Mg Tabs (Metformin hcl) .Marland Kitchen.. 1 tablet by mouth twice a day 7)  Warfarin Sodium 5 Mg Tabs (Warfarin sodium) .... Take as directed 8)  Pravastatin Sodium 40 Mg Tabs (Pravastatin sodium) .... Take 1 tablet by  mouth at bedtime 9)  Glyburide 2.5 Mg Tabs (Glyburide) .... Take 1 by mouth two times a day for diabetes 10)  Keflex 500 Mg Caps (Cephalexin) .... One tablet twice a day for 7 days 11)  Pyridium 100 Mg Tabs (Phenazopyridine hcl) .... One tablet three times a day for painful urination 12)  Cyclobenzaprine Hcl 5 Mg Tabs (Cyclobenzaprine hcl) .... Take 1-2 three times a day for muscle spasm.  will make you sleepy 13)  Lovenox 120 Mg/0.16ml Soln (Enoxaparin sodium) .Marland Kitchen.. 120 mg subq two times a day for anticoagulation 14)  Alprazolam 0.5 Mg Tabs (Alprazolam) .Marland Kitchen.. 1 by mouth three times a day as needed for anxiety 15)  Cephalexin 500 Mg Caps (Cephalexin) .Marland Kitchen.. 1 by mouth two times a day for 3 days. Prescriptions: CEPHALEXIN 500 MG CAPS (CEPHALEXIN) 1 by  mouth two times a day for 3 days.  #6 x 0   Entered and Authorized by:   Reeve Mallo [place] MD   Signed by:   Fermina Mishkin [place] MD on 04/27/2010   Method used:   Electronically to        [facility] [address] (retail)       [address], [facility] [address]       [place], [place]  [address]       Ph: [phone]       Fax: [phone]   RxID:   [id number] CEPHALEXIN 500 MG CAPS (CEPHALEXIN) 1 by mouth two times a day for 3 days.  #6 x 0   Entered and Authorized by:   Jashaun Penrose [place] MD   Signed by:   Quinton Voth [place] MD on 04/27/2010   Method used:   Print then Give to Patient   RxID:   [id number] ALPRAZOLAM 0.5 MG TABS (ALPRAZOLAM) 1 by mouth three times a day as needed for anxiety  #90 x 0   Entered and Authorized by:   Ligia Duguay [place] MD   Signed by:   Kirstie Larsen [place] MD on 04/27/2010   Method used:   Print then Give to Patient   RxID:   [id number]    Orders Added: 1)  [facility]- Est Level  2 [56433]

## 2010-05-08 ENCOUNTER — Other Ambulatory Visit: Payer: Self-pay | Admitting: Family Medicine

## 2010-05-08 ENCOUNTER — Encounter: Payer: Self-pay | Admitting: Family Medicine

## 2010-05-08 ENCOUNTER — Ambulatory Visit (INDEPENDENT_AMBULATORY_CARE_PROVIDER_SITE_OTHER): Payer: No Typology Code available for payment source | Admitting: Family Medicine

## 2010-05-08 DIAGNOSIS — R300 Dysuria: Secondary | ICD-10-CM

## 2010-05-08 DIAGNOSIS — R82998 Other abnormal findings in urine: Secondary | ICD-10-CM

## 2010-05-08 HISTORY — DX: Dysuria: R30.0

## 2010-05-08 LAB — CONVERTED CEMR LAB
Bilirubin Urine: NEGATIVE
Blood in Urine, dipstick: NEGATIVE
Glucose, Urine, Semiquant: 100
Ketones, urine, test strip: NEGATIVE
Nitrite: NEGATIVE
Protein, U semiquant: NEGATIVE
Specific Gravity, Urine: 1.01
Urobilinogen, UA: 0.2
pH: 6

## 2010-05-10 LAB — URINE CULTURE: Organism ID, Bacteria: NO GROWTH

## 2010-05-11 ENCOUNTER — Encounter: Payer: Self-pay | Admitting: Family Medicine

## 2010-05-15 ENCOUNTER — Telehealth: Payer: Self-pay | Admitting: Family Medicine

## 2010-05-15 NOTE — Telephone Encounter (Signed)
Allison [name] has the medication and will have it ready tomorrow.  Patient notified.

## 2010-05-17 ENCOUNTER — Encounter: Payer: Self-pay | Admitting: Family Medicine

## 2010-05-17 DIAGNOSIS — Z7901 Long term (current) use of anticoagulants: Secondary | ICD-10-CM | POA: Insufficient documentation

## 2010-05-17 DIAGNOSIS — I2699 Other pulmonary embolism without acute cor pulmonale: Secondary | ICD-10-CM

## 2010-05-18 NOTE — Assessment & Plan Note (Signed)
Summary: ?uti/bmc   Vital Signs:  Patient profile:   55 year old female Weight:      281 pounds Temp:     99 degrees F oral Pulse rate:   90 / minute BP sitting:   110 / 67  (right arm)  Vitals Entered By: Arlyss Repress CMA, (May 08, 2010 8:45 AM) CC: f/up dysuria Is Patient Diabetic? Yes Pain Assessment Patient in pain? no        Primary Care Provider:  Sarah [place] MD  CC:  f/up dysuria.  History of Present Illness: Awaiting for surgery first goiter and then total hysterectomy for uterine cancer.  Was on coumadin but now on Lovenox injections.  ENT surgery canceled last week because of a temp of 99, and abnormal UA, treated with Keflex for 3 days.  She does not feel that her urine is back to normal.  Urine felt hot and malodorus., with pain in stomach. She does not drink a lot of hydrating fluids.  Current Medications (verified): 1)  Advair Diskus 250-50 Mcg/dose Misc (Fluticasone-Salmeterol) .Marland Kitchen.. 1 Puff By Mouth  Twice A Day 2)  Albuterol 90 Mcg/act Aers (Albuterol) .... 2 Puffs Every 6 Hours As Needed 3)  Zyrtec 10 Mg Tabs (Cetirizine Hcl) .... Take 1 Tablet By Mouth Once A Day 4)  Lisinopril-Hydrochlorothiazide 20-12.5 Mg Tabs (Lisinopril-Hydrochlorothiazide) .... Take 1 Tablet By Mouth Once A Day 5)  Verapamil Hcl Cr 120 Mg  Cp24 (Verapamil Hcl) .... Take One Tablet Daily 6)  Metformin Hcl 1000 Mg Tabs (Metformin Hcl) .Marland Kitchen.. 1 Tablet By Mouth Twice A Day 7)  Pravastatin Sodium 40 Mg Tabs (Pravastatin Sodium) .... Take 1 Tablet By Mouth At Bedtime 8)  Glyburide 2.5 Mg Tabs (Glyburide) .... Take 1 By Mouth Two Times A Day For Diabetes 9)  Cyclobenzaprine Hcl 5 Mg Tabs (Cyclobenzaprine Hcl) .... Take 1-2 Three Times A Day For Muscle Spasm.  Will Make You Sleepy 10)  Lovenox 120 Mg/0.87ml Soln (Enoxaparin Sodium) .Marland Kitchen.. 120 Mg Subq Two Times A Day For Anticoagulation 11)  Alprazolam 0.5 Mg Tabs (Alprazolam) .Marland Kitchen.. 1 By Mouth Three Times A Day As Needed For Anxiety 12)   Cephalexin 500 Mg Caps (Cephalexin) .Marland Kitchen.. 1 By Mouth Three Times A Day For 7 Days.  Allergies: 1)  ! Simvastatin 2)  ! Tramadol Hcl (Tramadol Hcl)  Review of Systems  The patient denies anorexia, fever, weight loss, chest pain, peripheral edema, and abdominal pain.    Physical Exam  General:  Well-developed,well-nourished,in no acute distress; alert,appropriate and cooperative throughout examination Abdomen:  no CVA tenderness   Impression & Recommendations:  Problem # 1:  DYSURIA (ICD-788.1) Suspect asymptomatic bacturia without infection, surgery cancelend on 1/26 for a temp of 99 and bacteria in urine, the culture came back NO GROWTH; will go ahead and reculture urine today and would not be suprised if she has no growth again but will treat only because of upcoming surgery with Keflex at three times a day dosing for 7 days to achieve better drug levels. The following medications were removed from the medication list:    Keflex 500 Mg Caps (Cephalexin) ..... One tablet twice a day for 7 days    Pyridium 100 Mg Tabs (Phenazopyridine hcl) ..... One tablet three times a day for painful urination Her updated medication list for this problem includes:    Cephalexin 500 Mg Caps (Cephalexin) .Marland Kitchen... 1 by mouth three times a day for 7 days.  Orders: Urinalysis-[facility] (00000) Urine Culture-[facility] ([id number])  Complete Medication List: 1)  Advair Diskus 250-50 Mcg/dose Misc (Fluticasone-salmeterol) .Marland Kitchen.. 1 puff by mouth  twice a day 2)  Albuterol 90 Mcg/act Aers (Albuterol) .... 2 puffs every 6 hours as needed 3)  Zyrtec 10 Mg Tabs (Cetirizine hcl) .... Take 1 tablet by mouth once a day 4)  Lisinopril-hydrochlorothiazide 20-12.5 Mg Tabs (Lisinopril-hydrochlorothiazide) .... Take 1 tablet by mouth once a day 5)  Verapamil Hcl Cr 120 Mg Cp24 (Verapamil hcl) .... Take one tablet daily 6)  Metformin Hcl 1000 Mg Tabs (Metformin hcl) .Marland Kitchen.. 1 tablet by mouth twice a day 7)  Pravastatin Sodium 40 Mg Tabs  (Pravastatin sodium) .... Take 1 tablet by mouth at bedtime 8)  Glyburide 2.5 Mg Tabs (Glyburide) .... Take 1 by mouth two times a day for diabetes 9)  Cyclobenzaprine Hcl 5 Mg Tabs (Cyclobenzaprine hcl) .... Take 1-2 three times a day for muscle spasm.  will make you sleepy 10)  Lovenox 120 Mg/0.61ml Soln (Enoxaparin sodium) .Marland Kitchen.. 120 mg subq two times a day for anticoagulation 11)  Alprazolam 0.5 Mg Tabs (Alprazolam) .Marland Kitchen.. 1 by mouth three times a day as needed for anxiety 12)  Cephalexin 500 Mg Caps (Cephalexin) .Marland Kitchen.. 1 by mouth three times a day for 7 days.  Patient Instructions: 1)  Your last urine culture did not grow any organisms 2)  This time we could be dealing with bacturia as well and not infection, but will treat with a more frequent dosing scheldule and longer. 3)  I will call you with the culture report Prescriptions: CEPHALEXIN 500 MG CAPS (CEPHALEXIN) 1 by mouth three times a day for 7 days.  #21 x 0   Entered and Authorized by:   Luretha Murphy NP   Signed by:   Luretha Murphy NP on 05/08/2010   Method used:   Electronically to        [facility] (retail)       [address], [address]       [place], [place]  [address]       Ph: [phone]       Fax: [phone]   RxID:   [id number]    Orders Added: 1)  Urinalysis-[facility] [00000] 2)  Urine Culture-[facility] [[id number]]    Laboratory Results   Urine Tests  Date/Time Received: May 08, 2010 8:48 AM  Date/Time Reported: May 08, 2010 12:55 PM   Routine Urinalysis   Color: yellow Appearance: Clear Glucose: 100   (Normal Range: Negative) Bilirubin: negative   (Normal Range: Negative) Ketone: negative   (Normal Range: Negative) Spec. Gravity: 1.010   (Normal Range: 1.003-1.035) Blood: negative   (Normal Range: Negative) pH: 6.0   (Normal Range: 5.0-8.0) Protein: negative   (Normal Range: Negative) Urobilinogen: 0.2   (Normal Range: 0-1) Nitrite: negative   (Normal  Range: Negative) Leukocyte Esterace: moderate   (Normal Range: Negative)  Urine Microscopic WBC/HPF: 5-15 Bacteria/HPF: 3+ Epithelial/HPF: 1-5    Comments: urine sent for culture ...............test performed by......Marland KitchenBonnie A. Sw[place]il[place], MLS (ASCP)cm

## 2010-05-19 ENCOUNTER — Encounter: Payer: Self-pay | Admitting: Family Medicine

## 2010-05-22 ENCOUNTER — Encounter (HOSPITAL_COMMUNITY)
Admission: RE | Admit: 2010-05-22 | Discharge: 2010-05-22 | Disposition: A | Discharge status: Home / Self Care | Payer: No Typology Code available for payment source | Source: Ambulatory Visit | Attending: General Surgery | Admitting: General Surgery

## 2010-05-22 DIAGNOSIS — Z01812 Encounter for preprocedural laboratory examination: Secondary | ICD-10-CM | POA: Insufficient documentation

## 2010-05-22 LAB — CBC
MCHC: 32.5 g/dL (ref 30.0–36.0)
Platelets: 237 10*3/uL (ref 150–400)
RDW: 14.9 % (ref 11.5–15.5)
WBC: 6.9 10*3/uL (ref 4.0–10.5)

## 2010-05-22 LAB — COMPREHENSIVE METABOLIC PANEL
ALT: 37 U/L — ABNORMAL HIGH (ref 0–35)
Alkaline Phosphatase: 39 U/L (ref 39–117)
CO2: 25 mEq/L (ref 19–32)
GFR calc non Af Amer: 46 mL/min — ABNORMAL LOW (ref >60)
Glucose, Bld: 161 mg/dL — ABNORMAL HIGH (ref 70–99)
Potassium: 4.7 mEq/L (ref 3.5–5.1)
Sodium: 140 mEq/L (ref 135–145)
Total Bilirubin: 0.6 mg/dL (ref 0.3–1.2)

## 2010-05-22 LAB — PROTIME-INR: INR: 1.03 (ref 0.00–1.49)

## 2010-05-22 LAB — DIFFERENTIAL
Basophils Absolute: 0 10*3/uL (ref 0.0–0.1)
Eosinophils Absolute: 0.2 10*3/uL (ref 0.0–0.7)
Eosinophils Relative: 3 % (ref 0–5)

## 2010-05-24 NOTE — Miscellaneous (Signed)
  Clinical Lists Changes  Orders: Added new Test order of [facility]- Est Level  3 (99213) - Signed 

## 2010-05-26 ENCOUNTER — Other Ambulatory Visit: Payer: Self-pay | Admitting: General Surgery

## 2010-05-26 ENCOUNTER — Observation Stay (HOSPITAL_COMMUNITY)
Admission: RE | Admit: 2010-05-26 | Discharge: 2010-05-27 | Disposition: A | Discharge status: Home / Self Care | Payer: No Typology Code available for payment source | Source: Ambulatory Visit | Attending: General Surgery | Admitting: General Surgery

## 2010-05-26 DIAGNOSIS — Z86718 Personal history of other venous thrombosis and embolism: Secondary | ICD-10-CM | POA: Insufficient documentation

## 2010-05-26 DIAGNOSIS — I10 Essential (primary) hypertension: Secondary | ICD-10-CM | POA: Insufficient documentation

## 2010-05-26 DIAGNOSIS — Z7901 Long term (current) use of anticoagulants: Secondary | ICD-10-CM | POA: Insufficient documentation

## 2010-05-26 DIAGNOSIS — E042 Nontoxic multinodular goiter: Principal | ICD-10-CM | POA: Insufficient documentation

## 2010-05-26 DIAGNOSIS — C549 Malignant neoplasm of corpus uteri, unspecified: Secondary | ICD-10-CM | POA: Insufficient documentation

## 2010-05-26 DIAGNOSIS — Z01812 Encounter for preprocedural laboratory examination: Secondary | ICD-10-CM | POA: Insufficient documentation

## 2010-05-26 DIAGNOSIS — E119 Type 2 diabetes mellitus without complications: Secondary | ICD-10-CM | POA: Insufficient documentation

## 2010-05-26 LAB — GLUCOSE, CAPILLARY: Glucose-Capillary: 145 mg/dL — ABNORMAL HIGH (ref 70–99)

## 2010-05-27 LAB — GLUCOSE, CAPILLARY

## 2010-05-27 LAB — CALCIUM: Calcium: 8.6 mg/dL (ref 8.4–10.5)

## 2010-06-02 ENCOUNTER — Ambulatory Visit (INDEPENDENT_AMBULATORY_CARE_PROVIDER_SITE_OTHER): Payer: No Typology Code available for payment source | Admitting: Family Medicine

## 2010-06-02 VITALS — BP 128/86 | HR 125 | Temp 98.7°F | Wt 280.9 lb

## 2010-06-02 DIAGNOSIS — Z5181 Encounter for therapeutic drug level monitoring: Secondary | ICD-10-CM

## 2010-06-02 DIAGNOSIS — Z7901 Long term (current) use of anticoagulants: Secondary | ICD-10-CM

## 2010-06-02 NOTE — Progress Notes (Signed)
  Subjective:    Patient ID: Allison [name], female    DOB: 05-13-1955, 55 y.o.   MRN: [id number]  HPI 1. S/p thyroidectomy, hypothyroidism Pt. Had a thyroidectomy and is post op. She has no surgical complications. The wound is healing well with CDI steri strips in place. The patient had no malignancy on pathology. She is taking synthroid, she will have her TSH checked the next time she comes to the clinic in one month or so.  2. Endometrial cancer Planned for robotic TAH in April - she will need to be transitioned back to lovenox and off warfarin at that time.  3. Need for anticoagulation - Hx of PE Today she comes in on lovenox bridge. We will restart her regular coumadin dose and check an INR on Monday.   Review of Systems  Constitutional: Negative.   HENT: Negative.   Respiratory: Negative.   Gastrointestinal: Negative.        Objective:   Physical Exam        Assessment & Plan:  Pt. Is a 55 y/o c/f with hx of PE on coumadin currently lovenox bridge, endometrial cancer for surgery in April and s/p total thyroidectomy. See HPI.

## 2010-06-05 ENCOUNTER — Ambulatory Visit (INDEPENDENT_AMBULATORY_CARE_PROVIDER_SITE_OTHER): Payer: No Typology Code available for payment source | Admitting: *Deleted

## 2010-06-05 DIAGNOSIS — Z7901 Long term (current) use of anticoagulants: Secondary | ICD-10-CM

## 2010-06-05 DIAGNOSIS — I2699 Other pulmonary embolism without acute cor pulmonale: Secondary | ICD-10-CM

## 2010-06-05 NOTE — Op Note (Signed)
Allison [name], Allison [name]              ACCOUNT NO.:  [id number]  MEDICAL RECORD NO.:  [id number]           PATIENT TYPE:  I  LOCATION:  5149                         FACILITY:  [facility]  PHYSICIAN:  Adolph Pollack, M.D.DATE OF BIRTH:  28-Oct-1955  DATE OF PROCEDURE:  05/26/2010 DATE OF DISCHARGE:                              OPERATIVE REPORT   PREOPERATIVE DIAGNOSIS:  Multinodular thyroid goiter.  POSTOPERATIVE DIAGNOSIS:  Multinodular thyroid goiter.  PROCEDURE:  Total thyroidectomy.  SURGEON:  Adolph Pollack, MD  ASSISTANT:  Lennie Muckle, MD  ANESTHESIA:  General.  INDICATION:  Allison [name] is a 55 year old female who has a long history of a thyroid goiter.  She was discovered to have endometrial cancer in November 2011 and was scheduled for hysterectomy but because of the compression of the trachea, she could not be intubated.  She subsequently presented to me, and she does have compressive-type symptoms.  She now presents for total thyroidectomy, and we went over the procedure and the risks preoperatively.  TECHNIQUE:  She is brought to the operating room and was given an awake nasotracheal intubation by Dr. Kipp Brood, which she tolerated.  A roll was placed under her shoulders, and her neck was placed in slight extension.  The neck and upper chest were sterilely prepped and draped.  A transverse lower neck incision was made dividing the skin and subcutaneous tissue and the platysma muscle.  Subplatysmal flaps were then raised superiorly to the thyroid cartilage and inferiorly to the suprasternal notch.  The enveloping fascia between the strap muscles was divided in the midline.  I exposed the left lobe of the thyroid which was significantly enlarged and hypervascular.  There were two nodules superiorly.  Using careful blunt dissection, I began to mobilize left lobe and rolled it medially up into the wound.  Beginning at the superior pole and staying on the  thyroid gland, I divided the superior pole vessels between clips and ties.  I was able to mobilize the superior pole of the left lobe of the thyroid gland.  I marked the superior pole with a silk suture.  I then approached the inferior pole of the left lobe of the thyroid gland and staying on the thyroid divided small vessels between clips.  The inferior parathyroid gland was identified and preserved.  After this, I identified the recurrent laryngeal nerve and  traced it up into its insertion.  I then carried my plane of dissection anterior to this and divided the middle thyroidal vessels close to the thyroid gland between clips and using the harmonic scalpel.  Once I was up to the anterior trachea, I then dissected the left lobe of thyroid gland and isthmus freed from the trachea using electrocautery.  I then packed this side of the neck with gauze and approached the right lobe of thyroid gland.  I mobilized the strap muscles off the right lobe of thyroid gland using electrocautery and blunt dissection and was able to medialized part of the gland. Beginning superiorly, I identified the superior pole vessels close to the gland and divided between ties and clips, mobilizing the superior pole  of the gland.  I found what I thought was the superior parathyroid gland and preserved this.  I then went inferiorly and staying on the gland, I divided the inferior thyroidal vessels close to the gland mobilizing the inferior aspect of the right thyroid lobe.  I then identified the recurrent laryngeal nerve and traced it up into its insertion.  I carried my of plane of dissection anterior to this, in fact, left a little thyroidal tissue behind as it was fairly close to the nerve.  I removed all but approximately 1 g of tissue preserving the nerve.  I then dissected this free from the trachea using electrocautery.  The pyramidal lobe was dissected free using harmonic scalpel.  The multinodular thyroid  gland was then handed off the field and sent to pathology.  I inspected the right-sided area and noted some bleeding from the superior pole vessels which I controlled with 2-0 silk ties.  I inspected the right recurrent laryngeal nerve which was intact.  I then packed this with dry gauze.  I looked back in the left side and there was a little bit of oozing coming from the area near the recurrent laryngeal nerve, and I put some Surgicel on this followed by direct pressure.  I also found a little bit bleeding from one a branch that had been avulsed of the internal jugular vein on the left side and controlled this with a hemoclip.  I then packed a dry gauze into the left side of the neck and re-approached the right side of the neck which was not hemostatic.  Surgicel was placed in the right side of the neck.  Further inspection of the left- sided neck demonstrated hemostasis with a Surgicel was now adequate. Left recurrent laryngeal nerve was intact.  Next, I reapproximated the strap muscles with interrupted 3-0 Vicryl sutures.  The subcutaneous tissue was irrigated and was hemostatic.  The platysma was reapproximated with interrupted 3-0 Vicryl sutures.  The skin was closed with 4-0 Monocryl subcuticular stitch.  Steri-Strips and sterile dressings were applied.  She tolerated the procedure well without any apparent complications and was taken to recovery room in satisfactory condition.     Adolph Pollack, M.D.     Kari Baars  D:  05/26/2010  T:  05/27/2010  Job:  [id number]  cc:   Laurette Schimke, MD Sarah [place], MD  Electronically Signed by Avel Peace M.D. on 06/05/2010 09:56:54 AM

## 2010-06-12 ENCOUNTER — Other Ambulatory Visit: Payer: Self-pay | Admitting: Family Medicine

## 2010-06-12 ENCOUNTER — Ambulatory Visit: Payer: No Typology Code available for payment source | Admitting: Family Medicine

## 2010-06-12 ENCOUNTER — Ambulatory Visit: Payer: No Typology Code available for payment source

## 2010-06-12 ENCOUNTER — Telehealth: Payer: Self-pay | Admitting: Family Medicine

## 2010-06-12 VITALS — BP 157/86 | HR 126 | Temp 97.1°F

## 2010-06-12 DIAGNOSIS — Z79899 Other long term (current) drug therapy: Secondary | ICD-10-CM

## 2010-06-12 LAB — CONVERTED CEMR LAB
INR: 1.93 — ABNORMAL HIGH (ref <1.50)
Prothrombin Time: 21.7 s — ABNORMAL HIGH (ref 11.6–15.2)

## 2010-06-12 MED ORDER — GUAIFENESIN-CODEINE 100-10 MG/5ML PO SYRP: 5.0000 mL | ORAL_SOLUTION | Freq: Three times a day (TID) | ORAL | Status: AC | PRN

## 2010-06-12 NOTE — Progress Notes (Signed)
8 you female 2.5 weeks post op from thyroidectomy done with nasal intubation presents for INR check but also having some some nasal drainage and cough for 1 week.  Cough keeps her up at night, teeth hurt, throat is red.  Denies fevers, appetite is normal.  No sick contacts.  Has tried saline rinses without relief, Zyrtec without relief.  No OTC meds for this because she is worried about drug interactions with her warfarin.    Does note bruising to abdomen with shots.    PE: Obese. No distress.  Breathing comfortably. HEENT: TMs clear B without erythema/ bulging or retraction.  Canals patent B.  Conjunctiva clear without discharge or erythema.  OP clear with mild post cobblestoning. Steristrips in place over neck incision.  Teeth in fair repair. Lungs: CTAB without wheezes or rales. CV: Distant sounds, but not tachy as in initial vitals.  No M/G/R.   Abd: Obese.  B ecchymoses with induration but no warmth at injection sites.  A/P: Viral URI - Careful precautions given recent surgery, but pt without fever.  Symptomatic relief, avoiding decongestants given her increased BP.  Rx for guiafenisen/codeine given.  F/u based on her INR today.   Anticoagulation monitoring: INR pending today.  If therapeutic, will stop injections of lovonox.  Pt give 3 day supply of 60 mg injections today.  If she is currently therapeutic, she will hold those for bridging at her next surgery, which is April 17th.   Addendum: INR was 1.93.  Discussed with pt.  She will hold the lovenox injections and continue with her warfarin at her current dose.  She will call for a lab appt to have the INR rechecked in 2 days.

## 2010-06-12 NOTE — Telephone Encounter (Signed)
Needs to know lab results so she will know if she can stop the Lovenox.

## 2010-06-12 NOTE — Telephone Encounter (Signed)
Consulted Dr. Leveda Anna and he called patient and she stated that Dr. [place]il[place] had already called her.

## 2010-06-13 LAB — DIFFERENTIAL
Basophils Absolute: 0 10*3/uL (ref 0.0–0.1)
Basophils Relative: 0 % (ref 0–1)
Eosinophils Absolute: 0.1 10*3/uL (ref 0.0–0.7)
Eosinophils Relative: 2 % (ref 0–5)
Lymphocytes Relative: 28 % (ref 12–46)

## 2010-06-13 LAB — GLUCOSE, CAPILLARY: Glucose-Capillary: 125 mg/dL — ABNORMAL HIGH (ref 70–99)

## 2010-06-13 LAB — COMPREHENSIVE METABOLIC PANEL
AST: 22 U/L (ref 0–37)
Alkaline Phosphatase: 44 U/L (ref 39–117)
BUN: 15 mg/dL (ref 6–23)
CO2: 28 mEq/L (ref 19–32)
Chloride: 104 mEq/L (ref 96–112)
Creatinine, Ser: 1.19 mg/dL (ref 0.4–1.2)
GFR calc Af Amer: 57 mL/min — ABNORMAL LOW (ref >60)
GFR calc non Af Amer: 47 mL/min — ABNORMAL LOW (ref >60)
Potassium: 4.6 mEq/L (ref 3.5–5.1)
Total Bilirubin: 0.4 mg/dL (ref 0.3–1.2)

## 2010-06-13 LAB — PROTIME-INR
INR: 1.05 (ref 0.00–1.49)
Prothrombin Time: 29.6 seconds — ABNORMAL HIGH (ref 11.6–15.2)

## 2010-06-13 LAB — ABO/RH: ABO/RH(D): A NEG

## 2010-06-13 LAB — CBC
Hemoglobin: 12.4 g/dL (ref 12.0–15.0)
MCH: 29.9 pg (ref 26.0–34.0)
MCV: 89.1 fL (ref 78.0–100.0)
RBC: 4.15 MIL/uL (ref 3.87–5.11)

## 2010-06-13 LAB — TYPE AND SCREEN

## 2010-06-14 ENCOUNTER — Other Ambulatory Visit: Payer: No Typology Code available for payment source

## 2010-06-14 ENCOUNTER — Ambulatory Visit (INDEPENDENT_AMBULATORY_CARE_PROVIDER_SITE_OTHER): Payer: No Typology Code available for payment source | Admitting: *Deleted

## 2010-06-14 DIAGNOSIS — Z7901 Long term (current) use of anticoagulants: Secondary | ICD-10-CM

## 2010-06-14 DIAGNOSIS — I2699 Other pulmonary embolism without acute cor pulmonale: Secondary | ICD-10-CM

## 2010-06-14 LAB — BASIC METABOLIC PANEL
GFR calc non Af Amer: 42 mL/min — ABNORMAL LOW (ref >60)
Glucose, Bld: 79 mg/dL (ref 70–99)
Potassium: 4.1 mEq/L (ref 3.5–5.1)
Sodium: 135 mEq/L (ref 135–145)

## 2010-06-14 LAB — CBC
HCT: 37.8 % (ref 36.0–46.0)
Hemoglobin: 12.1 g/dL (ref 12.0–15.0)
MCHC: 32 g/dL (ref 30.0–36.0)
RBC: 4.17 MIL/uL (ref 3.87–5.11)

## 2010-06-14 LAB — URINALYSIS, ROUTINE W REFLEX MICROSCOPIC
Ketones, ur: NEGATIVE mg/dL
Nitrite: NEGATIVE
Protein, ur: NEGATIVE mg/dL
Urobilinogen, UA: 0.2 mg/dL (ref 0.0–1.0)

## 2010-06-14 LAB — URINE CULTURE: Culture: NO GROWTH

## 2010-06-14 LAB — DIFFERENTIAL
Basophils Relative: 0 % (ref 0–1)
Lymphocytes Relative: 31 % (ref 12–46)
Lymphs Abs: 2.1 10*3/uL (ref 0.7–4.0)
Monocytes Absolute: 0.6 10*3/uL (ref 0.1–1.0)
Monocytes Relative: 9 % (ref 3–12)
Neutro Abs: 3.9 10*3/uL (ref 1.7–7.7)

## 2010-06-14 LAB — POCT INR: INR: 2.5

## 2010-06-14 LAB — URINE MICROSCOPIC-ADD ON

## 2010-06-14 NOTE — Progress Notes (Signed)
Pt not on enoxaparin now.  INR at goal.

## 2010-06-15 LAB — POCT PREGNANCY, URINE: Preg Test, Ur: NEGATIVE

## 2010-06-17 LAB — GLUCOSE, CAPILLARY: Glucose-Capillary: 143 mg/dL — ABNORMAL HIGH (ref 70–99)

## 2010-06-19 NOTE — Discharge Summary (Signed)
  NAMEJUSTINA, Allison [name]              ACCOUNT NO.:  [id number]  MEDICAL RECORD NO.:  [id number]           PATIENT TYPE:  I  LOCATION:  5149                         FACILITY:  [facility]  PHYSICIAN:  Adolph Pollack, M.D.DATE OF BIRTH:  11/05/1955  DATE OF ADMISSION:  05/26/2010 DATE OF DISCHARGE:  05/27/2010                              DISCHARGE SUMMARY   FINAL DISCHARGE DIAGNOSIS:  Multinodular thyroid goiter.  SECONDARY DIAGNOSES: 1. Endometrial cancer. 2. Hypertension. 3. Diabetes mellitus. 4. Venous thromboembolism and chronic anticoagulation. 5. Asthma. 6. Anxiety.  PROCEDURES:  Total thyroidectomy, May 27, 2010.  REASON FOR ADMISSION:  [name] is a 55 year old female with a long history of a thyroid goiter.  She was planned on having a hysterectomy, but she was not able to be intubated because of compression of the trachea by the goiter and thus presents for thyroidectomy prior to having hysterectomy at a later date.  She does have compressive-type symptoms.  HOSPITAL COURSE:  She underwent a total thyroidectomy on May 26, 2010, which she tolerated well.  She was started on supplemental calcium and vitamin D, and her serum calcium the next day was 8.6.  She was swallowing well.  The wound was clean, voice was good, and she was able to be discharged.  DISPOSITION:  Discharged to home in satisfactory condition on May 27, 2010.  She will restart all of her medications including her Coumadin and have her prothrombin time checked in 4-5 days.  She will take Tylenol for pain.  She will stay on a full-liquid and soft diet and follow up with me in the office in 2-3 weeks.     Adolph Pollack, M.D.     Kari Baars  D:  06/10/2010  T:  06/11/2010  Job:  [id number]  cc:   Sarah [place], MD Laurette Schimke, MD  Electronically Signed by Avel Peace M.D. on 06/19/2010 09:17:25 AM

## 2010-06-28 ENCOUNTER — Encounter: Payer: Self-pay | Admitting: Family Medicine

## 2010-06-28 ENCOUNTER — Ambulatory Visit (INDEPENDENT_AMBULATORY_CARE_PROVIDER_SITE_OTHER): Payer: No Typology Code available for payment source | Admitting: *Deleted

## 2010-06-28 DIAGNOSIS — Z7901 Long term (current) use of anticoagulants: Secondary | ICD-10-CM

## 2010-06-28 DIAGNOSIS — I2699 Other pulmonary embolism without acute cor pulmonale: Secondary | ICD-10-CM

## 2010-06-28 LAB — POCT INR: INR: 3

## 2010-06-28 NOTE — Progress Notes (Signed)
Agree with plan.  Letter written with anticoagulation recs perioperatively and sent to admin team to mail to pt.

## 2010-07-06 ENCOUNTER — Ambulatory Visit: Payer: No Typology Code available for payment source | Attending: Gynecologic Oncology | Admitting: Gynecologic Oncology

## 2010-07-06 DIAGNOSIS — E785 Hyperlipidemia, unspecified: Secondary | ICD-10-CM | POA: Insufficient documentation

## 2010-07-06 DIAGNOSIS — J45909 Unspecified asthma, uncomplicated: Secondary | ICD-10-CM | POA: Insufficient documentation

## 2010-07-06 DIAGNOSIS — E119 Type 2 diabetes mellitus without complications: Secondary | ICD-10-CM | POA: Insufficient documentation

## 2010-07-06 DIAGNOSIS — I10 Essential (primary) hypertension: Secondary | ICD-10-CM | POA: Insufficient documentation

## 2010-07-06 DIAGNOSIS — Z86711 Personal history of pulmonary embolism: Secondary | ICD-10-CM | POA: Insufficient documentation

## 2010-07-06 DIAGNOSIS — C549 Malignant neoplasm of corpus uteri, unspecified: Secondary | ICD-10-CM | POA: Insufficient documentation

## 2010-07-06 DIAGNOSIS — Z7901 Long term (current) use of anticoagulants: Secondary | ICD-10-CM | POA: Insufficient documentation

## 2010-07-09 LAB — BASIC METABOLIC PANEL
BUN: 16 mg/dL (ref 6–23)
BUN: 21 mg/dL (ref 6–23)
BUN: 23 mg/dL (ref 6–23)
CO2: 28 mEq/L (ref 19–32)
CO2: 29 mEq/L (ref 19–32)
CO2: 29 mEq/L (ref 19–32)
CO2: 31 mEq/L (ref 19–32)
Calcium: 8.4 mg/dL (ref 8.4–10.5)
Calcium: 8.5 mg/dL (ref 8.4–10.5)
Chloride: 102 mEq/L (ref 96–112)
Chloride: 103 mEq/L (ref 96–112)
Chloride: 104 mEq/L (ref 96–112)
Chloride: 106 mEq/L (ref 96–112)
Creatinine, Ser: 1.11 mg/dL (ref 0.4–1.2)
Creatinine, Ser: 1.22 mg/dL — ABNORMAL HIGH (ref 0.4–1.2)
Creatinine, Ser: 1.23 mg/dL — ABNORMAL HIGH (ref 0.4–1.2)
GFR calc Af Amer: 50 mL/min — ABNORMAL LOW (ref >60)
GFR calc Af Amer: 55 mL/min — ABNORMAL LOW (ref >60)
GFR calc Af Amer: >60 mL/min (ref >60)
GFR calc non Af Amer: 45 mL/min — ABNORMAL LOW (ref >60)
GFR calc non Af Amer: 46 mL/min — ABNORMAL LOW (ref >60)
GFR calc non Af Amer: 52 mL/min — ABNORMAL LOW (ref >60)
Glucose, Bld: 124 mg/dL — ABNORMAL HIGH (ref 70–99)
Glucose, Bld: 53 mg/dL — ABNORMAL LOW (ref 70–99)
Glucose, Bld: 83 mg/dL (ref 70–99)
Potassium: 3.6 mEq/L (ref 3.5–5.1)
Potassium: 3.9 mEq/L (ref 3.5–5.1)
Sodium: 137 mEq/L (ref 135–145)
Sodium: 138 mEq/L (ref 135–145)
Sodium: 140 mEq/L (ref 135–145)

## 2010-07-09 LAB — CBC
HCT: 27.8 % — ABNORMAL LOW (ref 36.0–46.0)
HCT: 27.9 % — ABNORMAL LOW (ref 36.0–46.0)
HCT: 31.3 % — ABNORMAL LOW (ref 36.0–46.0)
Hemoglobin: 10.6 g/dL — ABNORMAL LOW (ref 12.0–15.0)
Hemoglobin: 9.5 g/dL — ABNORMAL LOW (ref 12.0–15.0)
Hemoglobin: 9.6 g/dL — ABNORMAL LOW (ref 12.0–15.0)
MCHC: 33.6 g/dL (ref 30.0–36.0)
MCHC: 33.8 g/dL (ref 30.0–36.0)
MCHC: 33.9 g/dL (ref 30.0–36.0)
MCV: 87.5 fL (ref 78.0–100.0)
MCV: 89 fL (ref 78.0–100.0)
MCV: 89 fL (ref 78.0–100.0)
MCV: 89.7 fL (ref 78.0–100.0)
MCV: 90.4 fL (ref 78.0–100.0)
Platelets: 194 10*3/uL (ref 150–400)
Platelets: 213 10*3/uL (ref 150–400)
Platelets: 233 10*3/uL (ref 150–400)
Platelets: 251 10*3/uL (ref 150–400)
RBC: 3.05 MIL/uL — ABNORMAL LOW (ref 3.87–5.11)
RBC: 3.11 MIL/uL — ABNORMAL LOW (ref 3.87–5.11)
RBC: 3.14 MIL/uL — ABNORMAL LOW (ref 3.87–5.11)
RDW: 12.5 % (ref 11.5–15.5)
RDW: 12.9 % (ref 11.5–15.5)
RDW: 13 % (ref 11.5–15.5)
RDW: 13.1 % (ref 11.5–15.5)
WBC: 4.9 10*3/uL (ref 4.0–10.5)
WBC: 5 10*3/uL (ref 4.0–10.5)
WBC: 5.5 10*3/uL (ref 4.0–10.5)
WBC: 9.9 10*3/uL (ref 4.0–10.5)

## 2010-07-09 LAB — DIFFERENTIAL
Basophils Absolute: 0 10*3/uL (ref 0.0–0.1)
Basophils Absolute: 0 10*3/uL (ref 0.0–0.1)
Eosinophils Relative: 1 % (ref 0–5)
Eosinophils Relative: 2 % (ref 0–5)
Lymphocytes Relative: 16 % (ref 12–46)
Lymphocytes Relative: 29 % (ref 12–46)
Monocytes Absolute: 0.6 10*3/uL (ref 0.1–1.0)
Monocytes Absolute: 0.6 10*3/uL (ref 0.1–1.0)
Monocytes Relative: 6 % (ref 3–12)
Monocytes Relative: 8 % (ref 3–12)
Neutro Abs: 3.9 10*3/uL (ref 1.7–7.7)
Neutro Abs: 7.6 10*3/uL (ref 1.7–7.7)

## 2010-07-09 LAB — PROTIME-INR
INR: 1.2 (ref 0.00–1.49)
INR: 1.7 — ABNORMAL HIGH (ref 0.00–1.49)
INR: 2.2 — ABNORMAL HIGH (ref 0.00–1.49)
INR: 2.8 — ABNORMAL HIGH (ref 0.00–1.49)
Prothrombin Time: 15.6 seconds — ABNORMAL HIGH (ref 11.6–15.2)
Prothrombin Time: 16.7 seconds — ABNORMAL HIGH (ref 11.6–15.2)
Prothrombin Time: 20.9 seconds — ABNORMAL HIGH (ref 11.6–15.2)
Prothrombin Time: 24.3 seconds — ABNORMAL HIGH (ref 11.6–15.2)
Prothrombin Time: 26.2 seconds — ABNORMAL HIGH (ref 11.6–15.2)
Prothrombin Time: 31.2 seconds — ABNORMAL HIGH (ref 11.6–15.2)

## 2010-07-09 LAB — POCT I-STAT 3, ART BLOOD GAS (G3+)
O2 Saturation: 97 %
Patient temperature: 98.2

## 2010-07-09 LAB — COMPREHENSIVE METABOLIC PANEL
AST: 23 U/L (ref 0–37)
Albumin: 3.8 g/dL (ref 3.5–5.2)
Alkaline Phosphatase: 59 U/L (ref 39–117)
Chloride: 101 mEq/L (ref 96–112)
GFR calc Af Amer: 55 mL/min — ABNORMAL LOW (ref >60)
Potassium: 4.2 mEq/L (ref 3.5–5.1)
Total Bilirubin: 1.1 mg/dL (ref 0.3–1.2)
Total Protein: 7.8 g/dL (ref 6.0–8.3)

## 2010-07-09 LAB — POCT CARDIAC MARKERS
CKMB, poc: <1 ng/mL — ABNORMAL LOW (ref 1.0–8.0)
Myoglobin, poc: 160 ng/mL (ref 12–200)
Myoglobin, poc: 79.8 ng/mL (ref 12–200)

## 2010-07-09 LAB — GLUCOSE, CAPILLARY
Glucose-Capillary: 107 mg/dL — ABNORMAL HIGH (ref 70–99)
Glucose-Capillary: 110 mg/dL — ABNORMAL HIGH (ref 70–99)
Glucose-Capillary: 116 mg/dL — ABNORMAL HIGH (ref 70–99)
Glucose-Capillary: 120 mg/dL — ABNORMAL HIGH (ref 70–99)
Glucose-Capillary: 121 mg/dL — ABNORMAL HIGH (ref 70–99)
Glucose-Capillary: 123 mg/dL — ABNORMAL HIGH (ref 70–99)
Glucose-Capillary: 135 mg/dL — ABNORMAL HIGH (ref 70–99)
Glucose-Capillary: 137 mg/dL — ABNORMAL HIGH (ref 70–99)
Glucose-Capillary: 158 mg/dL — ABNORMAL HIGH (ref 70–99)
Glucose-Capillary: 182 mg/dL — ABNORMAL HIGH (ref 70–99)
Glucose-Capillary: 61 mg/dL — ABNORMAL LOW (ref 70–99)
Glucose-Capillary: 66 mg/dL — ABNORMAL LOW (ref 70–99)
Glucose-Capillary: 71 mg/dL (ref 70–99)
Glucose-Capillary: 86 mg/dL (ref 70–99)
Glucose-Capillary: 92 mg/dL (ref 70–99)

## 2010-07-09 LAB — CULTURE, BLOOD (ROUTINE X 2)
Culture: NO GROWTH
Culture: NO GROWTH

## 2010-07-09 LAB — APTT: aPTT: 38 seconds — ABNORMAL HIGH (ref 24–37)

## 2010-07-11 ENCOUNTER — Other Ambulatory Visit: Payer: Self-pay | Admitting: Family Medicine

## 2010-07-11 NOTE — Telephone Encounter (Signed)
Called in rx

## 2010-07-11 NOTE — Consult Note (Signed)
Allison [name], Allison [name]              ACCOUNT NO.:  [id number]  MEDICAL RECORD NO.:  [id number]           PATIENT TYPE:  LOCATION:                                 FACILITY:  PHYSICIAN:  Allison Schimke, MD     DATE OF BIRTH:  [birth year]  DATE OF CONSULTATION:  07/06/2010 DATE OF DISCHARGE:                                CONSULTATION   Visit #[id number].  REASON FOR VISIT:  Preop for endometrial cancer staging.  HISTORY OF PRESENT ILLNESS:  This is a 55 year old gravida 2, para 2, last normal menstrual period 10 years ago who has had persistent intermittent vaginal bleeding for more than 10-1/2 years.  Her history is notable for pulmonary embolism that was diagnosed in July 2011 for which she is receiving warfarin.  In August 2011, she underwent evaluation of uterine bleeding.  Endometrial biopsies were consistent with a grade 1 endometrioid adenocarcinoma.  At the time of presentation, CT scan of chest was notable for a large 8-cm thyroid mass, without any evidence of tracheal compression.  She was taken to the operating room in March 2012, however, intubation was not possible because of the mass.  She was referred to Allison [name] and underwent total thyroidectomy on May 26, 2010, for multinodular goiter with some edematous changes.  Allison [name] was prescribed Provera after knowledge of definitive surgical staging for her uterine cancer would be delayed.  She, however, is not compliant with this dosing.  PAST MEDICAL HISTORY: 1. Pulmonary embolism in July 2010. 2. Insulin-dependent diabetes mellitus for 5 years. 3. Hypertension. 4. Hyperlipidemia. 5. Asthma. 6. Pneumonia.  PAST SURGICAL HISTORY:  Total thyroidectomy in February 2012, cesarean C- section, uterine curettage.  SOCIAL HISTORY:  The patient is married.  Denies tobacco or alcohol use.  MEDICATIONS: 1. Warfarin 5 mg daily. 2. Alprazolam 0.25 mg daily. 3. Pravastatin 40 mg daily. 4. Metformin 1000  mg daily. 5. Glyburide 5 mg daily. 6. Verapamil 120 mg daily. 7. Lisinopril with hydrochlorothiazide 20/12.5 mg daily. 8. Zyrtec 10 mg daily. 9. Albuterol and Advair p.r.n.  REVIEW OF SYSTEMS:  No nausea, vomiting, fever, chills.  No abdominal pain.  No difficulty swallowing.  No changes in her voice.  Reports some vaginal discharge.  Denies hematochezia or hematuria  FAMILY HISTORY:  No history of malignancies.  ALLERGIES:  No known drug allergies.  PHYSICAL EXAMINATION:  GENERAL:  Well-developed female, in no acute distress. VITAL SIGNS:  Weight 295 pounds, blood pressure 134/70, pulse 68. CHEST:  Clear to auscultation. HEART:  Regular rate and rhythm. NECK:  A 5-cm mass noted, healing well. LYMPH NODE SURVEY:  No cervical, supraclavicular, or inguinal adenopathy. BACK:  No CVA tenderness. ABDOMEN:  Morbidly obese.  Site of Lovenox injections visible and palpable with minimal tenderness. PELVIC:  Normal external genitalia, Bartholin, urethra, and Skene.  Some vaginal discharge.  Cervix small without any lesions.  No parametrial infiltration.  No cervical fullness. RECTAL:  Good anal sphincter tone without any masses.  IMPRESSION:  Grade 1 endometrial adenocarcinoma.  I appreciate the recommendations of Allison [name] regarding transition from Coumadin to Lovenox pre and postoperatively they are  much appreciated.  Uterine surgical staging has been rescheduled for April 17th, will be performed by Dr. Cleda [name].  The risks and benefits of the surgery were again restated to the patient, inclusive of infection, bleeding, damage to surrounding structures, prolonged hospitalization, reoperation.  All of her questions were answered to her satisfaction.  She is aware that it would be a robotic-assisted total abdominal hysterectomy, bilateral salpingo-oophorectomy with possible pelvic and periaortic lymph node dissection.     Allison Schimke, MD     WB/MEDQ  D:  07/06/2010   T:  07/07/2010  Job:  [id number]  cc:   Allison [name], M.D. [address], [address] [place] [address]  Allison [name], R.N. [phone] [address] [place], [place] [address]  Allison Swaziland, MD  Electronically Signed by Allison Schimke MD on 07/11/2010 07:00:59 AM

## 2010-07-11 NOTE — Telephone Encounter (Signed)
Refill request

## 2010-07-12 ENCOUNTER — Ambulatory Visit (INDEPENDENT_AMBULATORY_CARE_PROVIDER_SITE_OTHER): Payer: No Typology Code available for payment source | Admitting: *Deleted

## 2010-07-12 ENCOUNTER — Encounter: Payer: Self-pay | Admitting: Family Medicine

## 2010-07-12 DIAGNOSIS — Z7901 Long term (current) use of anticoagulants: Secondary | ICD-10-CM

## 2010-07-12 DIAGNOSIS — I2699 Other pulmonary embolism without acute cor pulmonale: Secondary | ICD-10-CM

## 2010-07-12 LAB — POCT INR: INR: 2.3

## 2010-07-12 NOTE — Progress Notes (Signed)
  Subjective:    Patient ID: Allison [name], female    DOB: 03-Jul-1955, 55 y.o.   MRN: [id number]  HPI    Review of Systems     Objective:   Physical Exam        Assessment & Plan:  Pt presented for INR check, and we discussed plan for anticoagulation after surgery.  See letter to pt for plan prior to surgery.  Pt has enough Lovenox at home to bridge before surgery.  She has discussed with Dr. Nelly Rout, her surgeon.  Allison [name] states that she can have a Lovenox shot post op day 1 before she leaves the hospital.  Review of her anticoagulation after her thyroidectomy shows that she became therapeutic on her Warfarin approx 10 days after resumption of Warfarin.  If her surgery goes well and the bleeding risk is minimal, then she will restart the warfarin the day of surgery (April 17) or the next night (April 18), starting at a dose of 7.5 mg. She will come to the [facility] on Friday, April 20 for an INR check.  I anticipate that we can then put her on her previous dose (7.5 mg M and Th and 5 mg other days) and continue her bridging with the Lovenox. She could then come to the [facility] early the following week for another INR check.   We dispensed enough Lovenox today to last for 6 days at 120 mg BID with an additional 80 mg.

## 2010-07-13 ENCOUNTER — Encounter (HOSPITAL_COMMUNITY): Payer: No Typology Code available for payment source

## 2010-07-13 ENCOUNTER — Other Ambulatory Visit: Payer: Self-pay | Admitting: Gynecologic Oncology

## 2010-07-13 ENCOUNTER — Ambulatory Visit (HOSPITAL_COMMUNITY)
Admission: RE | Admit: 2010-07-13 | Discharge: 2010-07-13 | Disposition: A | Discharge status: Home / Self Care | Payer: No Typology Code available for payment source | Source: Ambulatory Visit | Attending: Obstetrics & Gynecology | Admitting: Obstetrics & Gynecology

## 2010-07-13 ENCOUNTER — Other Ambulatory Visit: Payer: Self-pay | Admitting: Obstetrics & Gynecology

## 2010-07-13 DIAGNOSIS — C541 Malignant neoplasm of endometrium: Secondary | ICD-10-CM

## 2010-07-13 DIAGNOSIS — Z01818 Encounter for other preprocedural examination: Secondary | ICD-10-CM | POA: Insufficient documentation

## 2010-07-13 DIAGNOSIS — C549 Malignant neoplasm of corpus uteri, unspecified: Secondary | ICD-10-CM | POA: Insufficient documentation

## 2010-07-13 DIAGNOSIS — Z01812 Encounter for preprocedural laboratory examination: Secondary | ICD-10-CM | POA: Insufficient documentation

## 2010-07-13 DIAGNOSIS — I10 Essential (primary) hypertension: Secondary | ICD-10-CM | POA: Insufficient documentation

## 2010-07-13 DIAGNOSIS — E119 Type 2 diabetes mellitus without complications: Secondary | ICD-10-CM | POA: Insufficient documentation

## 2010-07-13 LAB — COMPREHENSIVE METABOLIC PANEL
Albumin: 3.8 g/dL (ref 3.5–5.2)
Alkaline Phosphatase: 47 U/L (ref 39–117)
BUN: 10 mg/dL (ref 6–23)
CO2: 26 mEq/L (ref 19–32)
Chloride: 104 mEq/L (ref 96–112)
Creatinine, Ser: 1.17 mg/dL (ref 0.4–1.2)
GFR calc non Af Amer: 48 mL/min — ABNORMAL LOW (ref >60)
Glucose, Bld: 163 mg/dL — ABNORMAL HIGH (ref 70–99)
Potassium: 4 mEq/L (ref 3.5–5.1)
Total Bilirubin: 0.6 mg/dL (ref 0.3–1.2)

## 2010-07-13 LAB — CBC
HCT: 37.6 % (ref 36.0–46.0)
Hemoglobin: 11.9 g/dL — ABNORMAL LOW (ref 12.0–15.0)
MCV: 90.2 fL (ref 78.0–100.0)
RDW: 14 % (ref 11.5–15.5)
WBC: 5.7 10*3/uL (ref 4.0–10.5)

## 2010-07-13 LAB — DIFFERENTIAL
Eosinophils Relative: 3 % (ref 0–5)
Lymphocytes Relative: 30 % (ref 12–46)
Lymphs Abs: 1.7 10*3/uL (ref 0.7–4.0)
Monocytes Absolute: 0.5 10*3/uL (ref 0.1–1.0)
Monocytes Relative: 8 % (ref 3–12)
Neutro Abs: 3.3 10*3/uL (ref 1.7–7.7)

## 2010-07-13 LAB — TYPE AND SCREEN
ABO/RH(D): A NEG
Antibody Screen: NEGATIVE

## 2010-07-16 ENCOUNTER — Other Ambulatory Visit: Payer: Self-pay | Admitting: Sports Medicine

## 2010-07-16 ENCOUNTER — Other Ambulatory Visit: Payer: Self-pay | Admitting: Family Medicine

## 2010-07-17 NOTE — Telephone Encounter (Signed)
Refill request

## 2010-07-18 ENCOUNTER — Other Ambulatory Visit: Payer: Self-pay | Admitting: Gynecologic Oncology

## 2010-07-18 ENCOUNTER — Inpatient Hospital Stay (HOSPITAL_COMMUNITY)
Admission: RE | Admit: 2010-07-18 | Discharge: 2010-07-25 | DRG: 741 | Disposition: A | Discharge status: Home / Self Care | Payer: No Typology Code available for payment source | Source: Ambulatory Visit | Attending: Obstetrics & Gynecology | Admitting: Obstetrics & Gynecology

## 2010-07-18 DIAGNOSIS — E1169 Type 2 diabetes mellitus with other specified complication: Secondary | ICD-10-CM | POA: Diagnosis present

## 2010-07-18 DIAGNOSIS — I10 Essential (primary) hypertension: Secondary | ICD-10-CM | POA: Diagnosis present

## 2010-07-18 DIAGNOSIS — C549 Malignant neoplasm of corpus uteri, unspecified: Principal | ICD-10-CM | POA: Diagnosis present

## 2010-07-18 LAB — PROTIME-INR: INR: 1.06 (ref 0.00–1.49)

## 2010-07-18 LAB — APTT: aPTT: 24 seconds (ref 24–37)

## 2010-07-18 LAB — GLUCOSE, CAPILLARY: Glucose-Capillary: 147 mg/dL — ABNORMAL HIGH (ref 70–99)

## 2010-07-19 LAB — BASIC METABOLIC PANEL
CO2: 26 mEq/L (ref 19–32)
GFR calc Af Amer: 59 mL/min — ABNORMAL LOW (ref >60)
GFR calc non Af Amer: 49 mL/min — ABNORMAL LOW (ref >60)
Glucose, Bld: 220 mg/dL — ABNORMAL HIGH (ref 70–99)
Potassium: 4.6 mEq/L (ref 3.5–5.1)
Sodium: 136 mEq/L (ref 135–145)

## 2010-07-19 LAB — GLUCOSE, CAPILLARY
Glucose-Capillary: 108 mg/dL — ABNORMAL HIGH (ref 70–99)
Glucose-Capillary: 148 mg/dL — ABNORMAL HIGH (ref 70–99)
Glucose-Capillary: 209 mg/dL — ABNORMAL HIGH (ref 70–99)
Glucose-Capillary: 222 mg/dL — ABNORMAL HIGH (ref 70–99)

## 2010-07-19 LAB — CBC
HCT: 30.4 % — ABNORMAL LOW (ref 36.0–46.0)
Hemoglobin: 9.8 g/dL — ABNORMAL LOW (ref 12.0–15.0)
WBC: 7.1 10*3/uL (ref 4.0–10.5)

## 2010-07-20 LAB — GLUCOSE, CAPILLARY
Glucose-Capillary: 117 mg/dL — ABNORMAL HIGH (ref 70–99)
Glucose-Capillary: 129 mg/dL — ABNORMAL HIGH (ref 70–99)
Glucose-Capillary: 156 mg/dL — ABNORMAL HIGH (ref 70–99)
Glucose-Capillary: 163 mg/dL — ABNORMAL HIGH (ref 70–99)

## 2010-07-21 ENCOUNTER — Ambulatory Visit: Payer: No Typology Code available for payment source

## 2010-07-21 ENCOUNTER — Telehealth: Payer: Self-pay | Admitting: Family Medicine

## 2010-07-21 DIAGNOSIS — E785 Hyperlipidemia, unspecified: Secondary | ICD-10-CM

## 2010-07-21 LAB — GLUCOSE, CAPILLARY
Glucose-Capillary: 109 mg/dL — ABNORMAL HIGH (ref 70–99)
Glucose-Capillary: 62 mg/dL — ABNORMAL LOW (ref 70–99)
Glucose-Capillary: 153 mg/dL — ABNORMAL HIGH (ref 70–99)
Glucose-Capillary: 156 mg/dL — ABNORMAL HIGH (ref 70–99)

## 2010-07-21 LAB — PROTIME-INR: Prothrombin Time: 15.4 seconds — ABNORMAL HIGH (ref 11.6–15.2)

## 2010-07-21 MED ORDER — PRAVASTATIN SODIUM 40 MG PO TABS: 40.0000 mg | ORAL_TABLET | Freq: Every day | ORAL | Status: DC

## 2010-07-21 NOTE — Telephone Encounter (Signed)
Daughter called back to say she had contacted pharmacy regarding moms pravastatin refill.  Would like to have called in to pharmacy before mom leave hospital.  Due to come home this weekend.

## 2010-07-21 NOTE — Op Note (Signed)
Allison [name], Allison [name]              ACCOUNT NO.:  [id number]  MEDICAL RECORD NO.:  [id number]           PATIENT TYPE:  I  LOCATION:  1523                         FACILITY:  [facility]  PHYSICIAN:  Damara Klunder A. Duard Brady, MD    DATE OF BIRTH:  [birth year]  DATE OF PROCEDURE:  07/18/2010 DATE OF DISCHARGE:                              OPERATIVE REPORT   PREOPERATIVE DIAGNOSIS:  Grade 1 endometrial carcinoma.  POSTOPERATIVE DIAGNOSIS:  Grade 1 endometrial carcinoma.  PROCEDURE:  Diagnostic laparoscopy, exploratory laparotomy, TAH-BSO.  SURGEONS:  Favio Moder A. Duard Brady, MD, and Roseanna Rainbow, MD  ASSISTANT:  Telford Nab, RN  ANESTHESIA:  General.  ANESTHESIOLOGIST:  Jill Side, MD  ESTIMATED BLOOD LOSS:  50 mL.  URINE OUTPUT:  250 mL.  IV FLUIDS:  2000 mL.  SPECIMEN:  Pelvic washings, cervix, uterus, bilateral tubes and ovaries sent to Pathology.  COMPLICATIONS:  None.  OPERATIVE FINDINGS:  Significant intra-abdominal adiposity and difficulty visualizing the uterus.  Limited Trendelenburg capability secondary to increased peak inspiratory pressures and low tidal volumes due to the same.  Frozen section revealed a grade 1 endometrioid adenocarcinoma with at most 50% myometrial invasion noted, it could just be adenomyosis.  The patient was seen in the preop holding area and identified as herself.  Informed consent was signed on the chart.  Risks and benefits of the procedure were discussed with the patient.  Her questions were elicited and answered to her satisfaction and she wished to proceed. Postoperative thromboprophylaxis was discussed with the patient as per her primary care physician with regard to starting Coumadin and Lovenox due to her history of pulmonary embolism.  The patient was taken to the operating room, placed in supine position. Her arms were tucked to her side with appropriate precautions and sleds. Shoulder blocks were then placed after the  general anesthesia was induced.  The patient was then placed in the dorsal lithotomy position and with appropriate precautions.  Time-out was performed to confirm the patient, the procedure, antibiotic, and allergy status.  The perineum and vagina were prepped in the usual fashion.  Foley catheter was inserted into the bladder under sterile conditions.  Sterile speculum was placed into the vagina and cervix was grasped with single-tooth tenaculum.  The cervix was dilated without difficulty.  The ZUMI with a medium KOH was then placed without difficulty.  The abdomen was prepped with 2 ChloraPrep sticks.  After awaiting for 3 minutes dry time, the patient was draped.  After confirming OG tube placement in the midclavicular line 2 cm below the costal margin on the left side, after anesthetizing the skin with 1 cc of 0.25% Marcaine, a 5-mm incision was made.  Under direct visualization with Optiview port, we entered the abdominal cavity.  The abdomen was insufflated with CO2 gas to a peak pressure of 13 mmHg.  Trendelenburg was attempted.  The patient did not tolerate the Trendelenburg secondary to significant increase in her peak inspiratory pressures and low tidal volumes.  The Trendelenburg was somewhat reversed.  A second 5 mm port was placed at the level of the umbilicus to allow improved visualization over  the area of the adiposity.  At this point, the uterus was noted to be quite large.  With a limited Trendelenburg, we were not able to have access to the abdomen and pelvis.  Therefore, decision was made to proceed with a laparotomy. The trocars removed and the CO2 gas was removed from the patient's abdomen.  A vertical midline incision was made superior to her pannus with a knife and carried down to the underlying fascia using Bovie cautery.  The fascia was identified and the fascial incision was extended superiorly and inferiorly.  The peritoneum was tented, entered sharply.  The  peritoneal incision was extended without difficulty.  The patient was then placed in mild Trendelenburg position.  The Bookwalter self-retaining retractor was placed on the bed.  Sidewall retractors were placed.  At this point, all points during the case sidewall retractors and I placed pressure on the psoas bellies.  The large and small bowel were packed away with a total of 5 laparotomy sponges. Abdominopelvic washings were obtained.  The cornu bilaterally were grasped with large Kelly clamps.  The round ligament on the left side was transected and the anterior and posterior leaves of the broad ligament were opened.  The ureter was identified on her left, a window was made between the IP and the ureter.  The IP was clamped with Anderson x2, transected, and suture ligated.  The bladder flap was then created.  It was somewhat difficult secondary to adhesive disease secondary to her cesarean sections as well as anatomic difficulties due to a very deep pelvis and morbid obesity.  Once we were satisfied with the bladder flap anteriorly, the uterine vessels on the left side were clamped with a curved mattress.  The uterine vessels were then transected and suture ligated.  Similar procedure was performed on the patient's right side.  We continued down the cardinal ligament using the curved mattress.  Each pedicle was clamped, transected, and suture ligated.  This was done after confirming at each point that the bladder was well inferior to the area of dissection.  Once we got to the cervicovaginal junction, curved mattress and clamps were used to come across just below the cervix and meet in the midline.  The cervix was amputated from the vagina and sent for frozen section.  The vaginal cuff was closed using sutures of 0 Vicryl in a figure-of-eight fashion. There was a small amount of bleeding anteriorly on the bladder.  When pinpoint hemostasis did not control this, a single  figure-of-eight suture of 2-0 Vicryl was placed.  There was adequate hemostasis.  The abdomen and pelvis were copiously irrigated.  Frozen section returned as a grade 1 lesion with adenomyosis.  It was anywhere from 30% to 50% myometrial invasion, but no outer half involvement.  Secondary to her significant adiposity and the anatomic limitations, we decided that we would not be able to proceed with adequate lymphadenectomy and based on the frozen section it was not necessarily indicated.  Therefore, the retractor blades were removed.  The laparotomy sponges were removed. After confirming appropriate lap count, the fascia was grasped with Kocher clamps.  The fascia was closed using a running suture mass closure of #1 PDS.  There were 2 sutures total used.  The subcutaneous tissues were irrigated, hemostasis obtained.  The skin was closed using skin clips.  All instrument, needle, Ray-Tec, and laparotomy counts were correct x2.  The pneumo-occluder balloon and the KOH ring of note had been removed prior to laparotomy.  The patient was taken to recovery room in stable condition.     Cotton Beckley A. Duard Brady, MD     PAG/MEDQ  D:  07/18/2010  T:  07/19/2010  Job:  [id number]  cc:   Telford Nab, R.N. [address] [place], [place] [address]  Roseanna Rainbow, M.D. Fax: [phone]  Sarah Swaziland, MD  Adolph Pollack, M.D. [address], [address] [place] [address]  Electronically Signed by Cleda Mccreedy MD on 07/21/2010 09:00:40 AM

## 2010-07-22 LAB — CBC
HCT: 30.4 % — ABNORMAL LOW (ref 36.0–46.0)
Hemoglobin: 9.9 g/dL — ABNORMAL LOW (ref 12.0–15.0)
MCHC: 32.6 g/dL (ref 30.0–36.0)
RDW: 13.9 % (ref 11.5–15.5)
WBC: 6 10*3/uL (ref 4.0–10.5)

## 2010-07-22 LAB — GLUCOSE, CAPILLARY
Glucose-Capillary: 83 mg/dL (ref 70–99)
Glucose-Capillary: 128 mg/dL — ABNORMAL HIGH (ref 70–99)

## 2010-07-22 LAB — PROTIME-INR: INR: 1.3 (ref 0.00–1.49)

## 2010-07-23 LAB — CBC
HCT: 30.9 % — ABNORMAL LOW (ref 36.0–46.0)
MCV: 89.8 fL (ref 78.0–100.0)
Platelets: 217 10*3/uL (ref 150–400)
RBC: 3.44 MIL/uL — ABNORMAL LOW (ref 3.87–5.11)
RDW: 14.1 % (ref 11.5–15.5)
WBC: 5.5 10*3/uL (ref 4.0–10.5)

## 2010-07-23 LAB — GLUCOSE, CAPILLARY
Glucose-Capillary: 111 mg/dL — ABNORMAL HIGH (ref 70–99)
Glucose-Capillary: 77 mg/dL (ref 70–99)
Glucose-Capillary: 116 mg/dL — ABNORMAL HIGH (ref 70–99)

## 2010-07-23 LAB — PROTIME-INR: INR: 1.39 (ref 0.00–1.49)

## 2010-07-24 LAB — GLUCOSE, CAPILLARY
Glucose-Capillary: 65 mg/dL — ABNORMAL LOW (ref 70–99)
Glucose-Capillary: 88 mg/dL (ref 70–99)
Glucose-Capillary: 92 mg/dL (ref 70–99)

## 2010-07-28 ENCOUNTER — Ambulatory Visit (INDEPENDENT_AMBULATORY_CARE_PROVIDER_SITE_OTHER): Payer: No Typology Code available for payment source | Admitting: *Deleted

## 2010-07-28 ENCOUNTER — Ambulatory Visit (INDEPENDENT_AMBULATORY_CARE_PROVIDER_SITE_OTHER): Payer: No Typology Code available for payment source | Admitting: Family Medicine

## 2010-07-28 VITALS — BP 148/78 | HR 121 | Temp 98.6°F | Ht 64.5 in | Wt 291.0 lb

## 2010-07-28 DIAGNOSIS — I2699 Other pulmonary embolism without acute cor pulmonale: Secondary | ICD-10-CM

## 2010-07-28 DIAGNOSIS — R300 Dysuria: Secondary | ICD-10-CM

## 2010-07-28 DIAGNOSIS — Z7901 Long term (current) use of anticoagulants: Secondary | ICD-10-CM

## 2010-07-28 DIAGNOSIS — R110 Nausea: Secondary | ICD-10-CM

## 2010-07-28 DIAGNOSIS — N390 Urinary tract infection, site not specified: Secondary | ICD-10-CM

## 2010-07-28 LAB — POCT UA - MICROSCOPIC ONLY

## 2010-07-28 LAB — POCT URINALYSIS DIPSTICK
Ketones, UA: NEGATIVE
Nitrite, UA: NEGATIVE
Protein, UA: NEGATIVE
Urobilinogen, UA: 0.2
pH, UA: 5.5

## 2010-07-28 MED ORDER — FOSFOMYCIN TROMETHAMINE 3 G PO PACK: PACK | ORAL | Status: DC

## 2010-07-28 MED ORDER — ONDANSETRON 4 MG PO TBDP: 4.0000 mg | ORAL_TABLET | Freq: Three times a day (TID) | ORAL | Status: AC | PRN

## 2010-07-28 NOTE — Assessment & Plan Note (Signed)
Pt with 2 days pf "bladder pressure" s/p TAH on 07/18/10 for endometrial cancer.  UA showed lysed RBC, +leuk, -nits, Umicro showed +cocci, 10-20 epith.  I am concerned with enteroccocus since she had catheter in during surgery and it was removed 1-2 days after surgery.  Will treat with Fosomycin x3 doses, which covers enterococus and Ecoli.  If this medicine is too expensive, I have given pt a hand written Rx for nitrofurantoin x 7 days.  Gave her red flags to go to ER.

## 2010-07-28 NOTE — Patient Instructions (Signed)
We will treat your UTI with Fosomycin. Take 1 packet every other days for 3 doses. If Fosomycin is too expensive, I have given you a Rx for Nitrofurantoin.  Take that for 7 days. If you develop fever, worse nausea and vomiting, back pain, please go to ER.  Please try Zofran for nausea.

## 2010-07-28 NOTE — Progress Notes (Signed)
  Subjective:    Patient ID: Allison [name], female    DOB: Jun 19, 1955, 55 y.o.   MRN: [id number]  HPI Pt had TAH for endometrial cancer 07/18/10.  Catheter was removed on 4/19.  She was discharged on 07/25/10.   She has been voiding ok until the 2 nights ago.  She complains of pelvic pain ,but  no dysuria, no frequency.  She feels like there is pressure when she has to void.  No fever/chills.  +Nausea (since surgery), vomiting, +abd pain with getting up (from surgery), +diarrhea since surgery.  Last UTI was 05/2010. No history of pyelonephritis.   Pain is 6 out of 10.  She is having pain s/p TAH but has not been able to take pain medicine due to nausea.  She has tried po antiemetics but has not tried odt or suppository anitemetic.    Review of Systems Per hpi     Objective:   Physical Exam  Constitutional: She appears well-developed and well-nourished.  Cardiovascular: Intact distal pulses.   Abdominal:       Medial vertical incision that is intact and clean. Multiple sterile strips present. Nonbloody.           Assessment & Plan:

## 2010-07-28 NOTE — Assessment & Plan Note (Signed)
Pt is in pain from surgery. She has Rx for oxycodone but has not taken it due to nausea.  After discussing options of phenergan suppository vs zofran odt, pt opted for zofran.  Will Rx this for nausea and hopefully pt will be able to tolerate and will have better pain control.

## 2010-08-01 NOTE — Discharge Summary (Signed)
  Allison [name], Allison [name]              ACCOUNT NO.:  [id number]  MEDICAL RECORD NO.:  [id number]           PATIENT TYPE:  I  LOCATION:  1523                         FACILITY:  [facility]  PHYSICIAN:  Roseanna Rainbow, M.D.DATE OF BIRTH:  1955-08-23  DATE OF ADMISSION:  07/18/2010 DATE OF DISCHARGE:  07/25/2010                              DISCHARGE SUMMARY   CHIEF COMPLAINT:  The patient is a 55 year old with an endometrial cancer, who presents for an operative staging procedure.  Please see the dictated history and physical for further details.  HOSPITAL COURSE:  The patient was admitted and underwent a diagnostic laparoscopy, exploratory laparotomy, total abdominal hysterectomy, and bilateral salpingo-oophorectomy.  Please see the dictated operative summary for findings.  On postoperative day #1, a hemoglobin was 9.8.  Her CBGs were in the low 200 range.  She had been started on Lantus at bedtime and the dose was increased.  The patient was continued on Lovenox initially.  She was given a prophylactic dose on postoperative day #1.  Her glycemic control improved.  She was started on therapeutic Lovenox on postoperative day #2 as well as Coumadin.  The patient's diet was gradually advanced and she was tolerating a regular diet.  The Lantus insulin was discontinued.  She was started on her preoperative oral hypoglycemic agents.  She had several borderline hypoglycemic events with CBGs in the 60 range and she was asymptomatic.  On postoperative day #7, an INR was 2.08 and the Lovenox was discontinued. She was then discharged to home.  DISCHARGE DIAGNOSIS:  Endometrioid carcinoma of FIGO grade 1 and stage 1- A.  PROCEDURES:  Diagnostic laparoscopy and exploratory laparotomy, total abdominal hysterectomy, bilateral salpingo-oophorectomy.  CONDITION:  Stable.  DIET:  ADA diet.  ACTIVITY:  Progressive activity, pelvic rest.  MEDICATIONS:  Percocet 5/325 1-2 tablets every 6 hours as  needed, Coumadin 5 mg q.h.s. and 7.5 mg on Mondays and Thursdays, glyburide 5 mg, Zyrtec 10 mg, verapamil SR 120 mg q.a.m., metformin 1000 mg b.i.d., Synthroid 125 mcg q.a.m., Advair Diskus twice daily as needed, lisinopril/hydrochlorothiazide 20/12.5 mg 1 tablet q.a.m., pravastatin 40 mg tablets 1 tablet p.o. q.h.s., alprazolam 0.5 mg 1 tablet p.o. t.i.d. p.r.n., albuterol inhaler 1 puff q.4h. as needed.  Please note that the Lovenox had been discontinued.     Roseanna Rainbow, M.D.     Judee Clara  D:  07/25/2010  T:  07/25/2010  Job:  [id number]  Electronically Signed by Antionette Char M.D. on 08/01/2010 03:29:59 PM

## 2010-08-03 ENCOUNTER — Ambulatory Visit (INDEPENDENT_AMBULATORY_CARE_PROVIDER_SITE_OTHER): Payer: No Typology Code available for payment source | Admitting: *Deleted

## 2010-08-03 DIAGNOSIS — I2699 Other pulmonary embolism without acute cor pulmonale: Secondary | ICD-10-CM

## 2010-08-03 DIAGNOSIS — Z7901 Long term (current) use of anticoagulants: Secondary | ICD-10-CM

## 2010-08-03 LAB — POCT INR: INR: 2.4

## 2010-08-11 ENCOUNTER — Ambulatory Visit (INDEPENDENT_AMBULATORY_CARE_PROVIDER_SITE_OTHER): Payer: No Typology Code available for payment source | Admitting: *Deleted

## 2010-08-11 DIAGNOSIS — Z7901 Long term (current) use of anticoagulants: Secondary | ICD-10-CM

## 2010-08-11 DIAGNOSIS — I2699 Other pulmonary embolism without acute cor pulmonale: Secondary | ICD-10-CM

## 2010-08-14 ENCOUNTER — Ambulatory Visit: Payer: No Typology Code available for payment source | Attending: Gynecology | Admitting: Gynecology

## 2010-08-14 ENCOUNTER — Other Ambulatory Visit: Payer: Self-pay | Admitting: Gynecology

## 2010-08-14 DIAGNOSIS — F341 Dysthymic disorder: Secondary | ICD-10-CM | POA: Insufficient documentation

## 2010-08-14 DIAGNOSIS — E119 Type 2 diabetes mellitus without complications: Secondary | ICD-10-CM | POA: Insufficient documentation

## 2010-08-14 DIAGNOSIS — J45909 Unspecified asthma, uncomplicated: Secondary | ICD-10-CM | POA: Insufficient documentation

## 2010-08-14 DIAGNOSIS — Z7901 Long term (current) use of anticoagulants: Secondary | ICD-10-CM | POA: Insufficient documentation

## 2010-08-14 DIAGNOSIS — Z79899 Other long term (current) drug therapy: Secondary | ICD-10-CM | POA: Insufficient documentation

## 2010-08-14 DIAGNOSIS — Z86718 Personal history of other venous thrombosis and embolism: Secondary | ICD-10-CM | POA: Insufficient documentation

## 2010-08-14 DIAGNOSIS — C549 Malignant neoplasm of corpus uteri, unspecified: Secondary | ICD-10-CM | POA: Insufficient documentation

## 2010-08-14 DIAGNOSIS — E079 Disorder of thyroid, unspecified: Secondary | ICD-10-CM | POA: Insufficient documentation

## 2010-08-14 DIAGNOSIS — I10 Essential (primary) hypertension: Secondary | ICD-10-CM | POA: Insufficient documentation

## 2010-08-14 LAB — URINALYSIS, ROUTINE W REFLEX MICROSCOPIC
Hgb urine dipstick: NEGATIVE
Nitrite: NEGATIVE
Protein, ur: NEGATIVE mg/dL
Specific Gravity, Urine: 1.01 (ref 1.005–1.030)
Urobilinogen, UA: 0.2 mg/dL (ref 0.0–1.0)

## 2010-08-14 LAB — URINE MICROSCOPIC-ADD ON

## 2010-08-15 LAB — URINE CULTURE
Colony Count: NO GROWTH
Culture: NO GROWTH
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
## 2010-08-15 NOTE — Discharge Summary (Signed)
NAMEKAREEMA, KEITT NO.:  [id number]   MEDICAL RECORD NO.:  [id number]           PATIENT TYPE:   LOCATION:                                 FACILITY:   PHYSICIAN:  Nestor Ramp, MD             DATE OF BIRTH:   DATE OF ADMISSION:  10/02/2008  DATE OF DISCHARGE:  10/08/2008                               DISCHARGE SUMMARY   Patient: Allison [name] MRN# [id number]   PCP:  Denny Levy, MD.   ADMITTING DIAGNOSIS:  Shortness of breath.   DISCHARGE DIAGNOSES:  1. Pulmonary embolus.  2. Pneumonia.   CONSULTS:  None.   PROCEDURES:  None.   LABS AND RADIOGRAPHS ON ADMISSION:  Hemoglobin 11.1, platelets 252,  white blood count 9.9.  Creatinine 1.23.  D-dimer 2.86.  ABG - pH 7.45,  CO2  of 33.1, O2  of 85, bicarbonate 23.1.   Chest x-ray - Minimal left basilar air space disease may be due to  atelectasis versus pneumonia.   CT angiogram - Bilateral pulmonary emboli and probable left lower lobe  infarction, lower lobe pneumonia cannot be excluded, enlarged thyroid  with possible dominant nodule on left.  Non-emergent ultrasound will be  helpful in further evaluation if clinically indicated.   LABS AND RADIOGRAPHS ON DISCHARGE:  White blood count 4.9, hemoglobin  9.5, platelets 235.  Creatinine 1.34.  INR 2.2.   HOSPITAL COURSE:  Briefly, this is a 55 year old female with a history  of hypertension, diabetes, asthma who presented to the ED with  progressive shortness of breath.  The patient was evaluated in the  [facility] clinic 1 day prior and diagnosed with a likely bacterial  sinusitis progressing to bronchitis and given a prescription for  doxycycline.  Oxygen saturations were greater than 95% in clinic and the  peak airflow was at 75% of predicted and the patient was also afebrile.  Prior to the patient status post clinic visit shortness of breath  worsened over night as well as bilateral rib pain caused her to seek  medical attention to which nebulizers  were not relieving the pain.  The  patient also complained of pleuritic chest pain.  Upon further  examination, CT angiogram showed bilateral pulmonary embolus with  questionable left lower lobe pneumonia in conjunction with a D-dimer of  2.86 upon further evaluation in the ED.  For further information, please  refer to the H and P.   PROBLEM:  1. Pulmonary embolus.  The patient was placed on Coumadin with Lovenox      per bridging protocol x5 days.  Lovenox was discontinued on      hospital day 5 when the patient's INR was greater than 2 for      greater than 24 hours.  There were no complications to      anticoagulation during the hospitalization.  Upon discharge, the      patient had an INR of 2.2 for greater than 24 hours.  The patient's      initial complaint of chest pain and shortness of breath  symptomatically improved progressively throughout the      hospitalization with no increased need in O2 requirements.  2. Pneumonia.  The patient was placed on a 10-day course of      doxycycline upon hospitalization, to which the course was finished      post discharge.  There was symptomatic improvement in the patient's      pulmonary status throughout the hospitalization without any need in      increased O2 requirements.   DISCHARGE CONDITION:  Stable.   DISPOSITION:  The patient is stable to go home.   MEDICATIONS:  1. Advair Diskus 250/50 one puff by mouth twice a day.  2. Albuterol 90 mcg per actuation 2 puffs every 6 hours as needed.  3. Zyrtec 10 mg 1 tab once a day.  4. Metformin 1000 mg 1 tab twice a day.  5. Pravastatin sodium 40 mg 1 tab once a day.  6. Singulair 10 mg 1 tab once a day.  7. Glyburide 5 mg 1 tab twice a day.  8. Alprazolam 0.25 mg 1 tab by mouth 3 times a day.  9. Verapamil 120 mg 1 tab once a day.  10.Tessalon Perles 100 mg 1 capsule 3 times a day as needed.  11.Coumadin 7.5 mg 1 tab daily at 6 p.m.   INSTRUCTIONS:  1. The patient is to follow  up at the [facility] on October 11, 2008 for an INR check.  2. The patient is to follow up with the [facility] with Dr. Stephens November on October 14, 2008, at 11:25 a.m.   FOLLOWUP:  In light of patient's finding of  an enlarged thyroid with  possible dominant nodule on CT, the patient will need to follow up for a  non-emergent ultrasound upon discharge for further evaluation of that  nodule.      Doree Albee, MD  Electronically Signed      Nestor Ramp, MD  Electronically Signed    SN/MEDQ  D:  10/17/2008  T:  10/17/2008  Job:  [id number]

## 2010-08-22 ENCOUNTER — Other Ambulatory Visit: Payer: Self-pay | Admitting: Family Medicine

## 2010-08-23 NOTE — Telephone Encounter (Signed)
Refill request

## 2010-08-29 ENCOUNTER — Ambulatory Visit (INDEPENDENT_AMBULATORY_CARE_PROVIDER_SITE_OTHER): Payer: No Typology Code available for payment source | Admitting: *Deleted

## 2010-08-29 DIAGNOSIS — I2699 Other pulmonary embolism without acute cor pulmonale: Secondary | ICD-10-CM

## 2010-08-29 DIAGNOSIS — Z7901 Long term (current) use of anticoagulants: Secondary | ICD-10-CM

## 2010-08-29 LAB — POCT INR: INR: 2.2

## 2010-08-29 MED ORDER — FLUCONAZOLE 150 MG PO TABS: 150.0000 mg | ORAL_TABLET | Freq: Once | ORAL | Status: AC

## 2010-08-29 NOTE — Patient Instructions (Signed)
Please decrease your warfarin for the next few days as we discussed.  Paper calendar was prepared and given to pt.

## 2010-08-30 ENCOUNTER — Ambulatory Visit: Payer: No Typology Code available for payment source | Admitting: Gynecologic Oncology

## 2010-08-30 ENCOUNTER — Ambulatory Visit: Payer: No Typology Code available for payment source | Attending: Gynecologic Oncology | Admitting: Gynecologic Oncology

## 2010-08-30 DIAGNOSIS — C549 Malignant neoplasm of corpus uteri, unspecified: Secondary | ICD-10-CM | POA: Insufficient documentation

## 2010-08-30 DIAGNOSIS — Z9079 Acquired absence of other genital organ(s): Secondary | ICD-10-CM | POA: Insufficient documentation

## 2010-08-30 DIAGNOSIS — Z90710 Acquired absence of both cervix and uterus: Secondary | ICD-10-CM | POA: Insufficient documentation

## 2010-08-31 NOTE — Consult Note (Signed)
Allison [name], Allison [name]              ACCOUNT NO.:  [id number]  MEDICAL RECORD NO.:  [id number]           PATIENT TYPE:  O  LOCATION:  GYN                          FACILITY:  [facility]  PHYSICIAN:  Thurmond Hildebran A. Duard Brady, MD    DATE OF BIRTH:  15-Jul-1955  DATE OF CONSULTATION:  08/30/2010 DATE OF DISCHARGE:                                CONSULTATION   HISTORY:  Allison [name] is a very pleasant 55 year old who had an endometrial biopsy that revealed endometrial carcinoma.  On July 18, 2010, she underwent diagnostic laparoscopy and conversion to laparotomy TAH-BSO.  Operative findings included significant intra-abdominal adiposity and difficulty visualizing the uterus.  There was limited Trendelenburg capability secondary to increased peak inspiratory pressures and low tidal volumes due to the above.  She underwent an uncomplicated hysterectomy.  Pathology revealed a grade 1 endometrial carcinoma with less than 50% myometrial invasion.  Final pathology revealed a grade 1 endometrioid adenocarcinoma with 1.5 out of 3.2 cm of thickness, so less than 50%.  The maximum tumor size was 7 cm in gross measurement.  There was no lymphovascular space involvement.  The adnexa were negative bilaterally.  Postoperatively she had a bit of a superficial contact dermatitis or superficial skin separation and irritation.  I saw her on May 16, at which time there was evidence of contact dermatitis at the level of the umbilicus.  She was given a prescription for Keflex.  She subsequently did develop a bit of a yeast infection and was treated with Diflucan.  She is otherwise doing quite well.  She states that her activity and energy levels are increasing. She denies any vaginal bleeding.  She has had normal resumption of bowel and bladder function.  PHYSICAL EXAMINATION:  VITAL SIGNS:  Weight 302 pounds, which is up from 295 pounds preoperatively.  Blood pressure 132/78, pulse 70, temperature 98.5. GENERAL:  A  well-nourished, well-developed female in no acute distress. ABDOMEN:  Shows a vertical midline incision.  She has some dry skin surrounding the incision.  There is no gross visible lesions.  Her skin is intact.  She has some granulomas from her Lovenox injections. PELVIC:  External genitalia is within normal limits.  The vagina is well- epithelialized.  The vaginal cuff is visualized.  It is healing well.  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  PLAN: 1. She will get some cocoa butter to rub on the areas of dry skin on     the abdomen. 2. She can be released to her usual activities and increase her     activity level. 3. We discussed lifestyle modifications and weight loss.  We set a     goal of a 10-pounds weight loss when she returns to see [place] in 6     months.  She will call us if there are any issues or problems     before her next visit with [place].     Allison [name] A. Duard Brady, MD     PAG/MEDQ  D:  08/30/2010  T:  08/30/2010  Job:  [id number]  cc:  Adolph Pollack, M.D. [address] [place] [address]  Telford Nab, R.N. [phone] [address] [place], [place] [address]  Sarah Swaziland, MD  Catalina Antigua, MD  Electronically Signed by Cleda Mccreedy MD on 08/31/2010 09:09:27 AM

## 2010-09-01 ENCOUNTER — Ambulatory Visit (INDEPENDENT_AMBULATORY_CARE_PROVIDER_SITE_OTHER): Payer: No Typology Code available for payment source | Admitting: *Deleted

## 2010-09-01 DIAGNOSIS — I2699 Other pulmonary embolism without acute cor pulmonale: Secondary | ICD-10-CM

## 2010-09-01 DIAGNOSIS — Z7901 Long term (current) use of anticoagulants: Secondary | ICD-10-CM

## 2010-09-08 ENCOUNTER — Ambulatory Visit (INDEPENDENT_AMBULATORY_CARE_PROVIDER_SITE_OTHER): Payer: No Typology Code available for payment source | Admitting: *Deleted

## 2010-09-08 DIAGNOSIS — Z7901 Long term (current) use of anticoagulants: Secondary | ICD-10-CM

## 2010-09-08 DIAGNOSIS — I2699 Other pulmonary embolism without acute cor pulmonale: Secondary | ICD-10-CM

## 2010-09-08 LAB — POCT INR: INR: 3.1

## 2010-09-12 ENCOUNTER — Ambulatory Visit (INDEPENDENT_AMBULATORY_CARE_PROVIDER_SITE_OTHER): Payer: No Typology Code available for payment source | Admitting: *Deleted

## 2010-09-12 DIAGNOSIS — Z7901 Long term (current) use of anticoagulants: Secondary | ICD-10-CM

## 2010-09-12 DIAGNOSIS — I2699 Other pulmonary embolism without acute cor pulmonale: Secondary | ICD-10-CM

## 2010-09-12 LAB — POCT INR: INR: 2.8

## 2010-09-22 ENCOUNTER — Ambulatory Visit (INDEPENDENT_AMBULATORY_CARE_PROVIDER_SITE_OTHER): Payer: No Typology Code available for payment source | Admitting: Family Medicine

## 2010-09-22 ENCOUNTER — Telehealth: Payer: Self-pay | Admitting: Family Medicine

## 2010-09-22 VITALS — BP 142/77 | HR 110 | Temp 99.3°F

## 2010-09-22 DIAGNOSIS — J45909 Unspecified asthma, uncomplicated: Secondary | ICD-10-CM

## 2010-09-22 DIAGNOSIS — L0291 Cutaneous abscess, unspecified: Secondary | ICD-10-CM

## 2010-09-22 DIAGNOSIS — L0390 Cellulitis, unspecified: Secondary | ICD-10-CM

## 2010-09-22 MED ORDER — DOXYCYCLINE HYCLATE 100 MG PO TABS: 100.0000 mg | ORAL_TABLET | Freq: Two times a day (BID) | ORAL | Status: AC

## 2010-09-22 MED ORDER — FLUTICASONE PROPIONATE 50 MCG/ACT NA SUSP: 1.0000 | Freq: Every day | NASAL | Status: DC

## 2010-09-22 NOTE — Progress Notes (Signed)
Subjective:    Patient ID: Allison [name], female    DOB: 1955/06/02, 55 y.o.   MRN: [id number]  HPI 55 year old woman presents with two weeks of progressive swelling and "tight"feeling in her right lower leg, and two days of a red pruritic rash on the posterior aspect of right lower leg.  No pain in area of rash, but +occasional throbbing and burning at night when pt goes to bed.  No pain with ambulation (other than arthritis pain in knees).  No pallor or cyanosis noted.  Denies fevers, night sweats, chills, + baseline fatigue (recent partial thyroidectomy)- no increase, no appetite change, no unintended weight changes.  +Headache yesterday, + arthralgias of BLLE joints- hx osteoarthritis (no increase from baseline).  Does not recall any tick or insect bites- does not spend much time outdoors, has an outdoor dog that she is not primary caregiver for- dog is using flea & tick prevention.  She noticed a very small red area on medial-anterior aspect of mid right calf about 1 month ago that she did not know etiology of- not raised, itching, and did not develop rash.  No change in self-care products.  No recent accidents, injuries, slips/falls.  No recent cuts or tears in skin.    Last hospitalization in April for TAH 2/2 endometrial cancer (had been on megace prior to surgery).  Developed a pulmonary embolism in 09/2008 for which she has been on anticoagulation with warfarin (next PT/INR check due on Tuesday 6/26).  Ambulates regularly in her home, but uses scooters when shopping.   Pt also reports increased coughing and nasal drainage over last 2 months: coughing worse at night and early morning- now coughing at times during the day. No hemoptysis. Cough productive of mostly clear, thick, stringy sputum, occasional green tinge.  PMH signigicant for asthma.    Review of Systems  Constitutional: Positive for fatigue. Negative for fever, chills, diaphoresis, activity change, appetite change and unexpected  weight change.  HENT: Positive for rhinorrhea and postnasal drip. Negative for sore throat and trouble swallowing.   Eyes: Negative for redness and itching.  Respiratory: Positive for cough and chest tightness. Negative for shortness of breath.        Chest can occasionally feel tight during coughing spell at night  Cardiovascular: Positive for leg swelling. Negative for chest pain and palpitations.  Gastrointestinal: Negative for nausea, vomiting and blood in stool.  Genitourinary: Negative for hematuria.  Musculoskeletal: Positive for arthralgias and gait problem. Negative for myalgias and joint swelling.       Not generalized arthralgias- in bilat knees- has arthritis in knees.  Patient's gait is affected by her knee pain- cannot walk for longer than from car to storefront.  Skin: Positive for rash. Negative for pallor.       Right calf rash X 2 days  Neurological: Positive for headaches. Negative for weakness and numbness.       Yesterday 6/21  Hematological: Negative for adenopathy. Does not bruise/bleed easily.       Objective:   Physical Exam  Constitutional: She is oriented to person, place, and time. She appears well-developed and well-nourished. She is cooperative. She appears ill. No distress.       T=99.3, skin feels warm and moist/damp- slightly diaphoretic  HENT:  Head: Normocephalic and atraumatic.  Nose: Rhinorrhea present. No mucosal edema or sinus tenderness. No epistaxis.  Mouth/Throat: Uvula is midline and mucous membranes are normal. Posterior oropharyngeal erythema present. No oropharyngeal exudate or posterior  oropharyngeal edema.       Hair on head is generally very thin   Eyes: Conjunctivae are normal. Pupils are equal, round, and reactive to light.  Neck: Neck supple.  Cardiovascular: Regular rhythm, S1 normal, S2 normal and normal heart sounds.  Tachycardia present.  Exam reveals no gallop and no friction rub.   No murmur heard. Pulses:      Radial pulses  are 2+ on the right side, and 2+ on the left side.       Dorsalis pedis pulses are 1+ on the right side, and 1+ on the left side.       HR=112 apically  Pulmonary/Chest: Effort normal and breath sounds normal.  Musculoskeletal: She exhibits edema.       Right lower leg: She exhibits swelling and edema.       Right mid-calf circumference (48.5cm) is 4.5 cm larger than left mid-calf circumference (44cm).  1+ pretibial edema on right LE (tender to deep palpation), none on left.  Right calf with rash, feels full, tight, and firm- tissue not compliant.   Lymphadenopathy:    She has no cervical adenopathy.  Neurological: She is alert and oriented to person, place, and time.  Skin: Rash noted. Rash is macular. She is diaphoretic. There is erythema.          Confluent macular rash on right calf, partially blancing, areas of central clearing. Does not resemble typical "bulls-eye" rash.  Not a perfectly symmetrical shape, but at its largest, it measures 11cm X 10.5cm.  Scattered, faint petechiae on BLLE.  Right LE, below knee, slightly more red than left.          Assessment & Plan:

## 2010-09-22 NOTE — Patient Instructions (Addendum)
1. Rash/swelling of right calf- we are treating you for a possible tick-borne illness or a cellulitis.  A. Take doxycycline 100mg  by mouth every 12 hours for 2 weeks.    B. Be sure to wear sunscreen when you go outdoors because doxycycline can increase your risk for sun burn.   C. Doxycycline can increase the effects of your warfarin, making your more prone to bleeding.  You are scheduled to have your bleeding time checked on Tuesday.  Be sure to tell Kendal Hymen that you started a new antibiotic on Friday that can interact with your warfarin and increase bleeding risk.  If you have any signs of spontaneous bleeding (very easy bruising, spontaneous nose bleeds, gum bleeding, or blood in your urine or stool), please call and notify your provider.  DDoreatha Martin the entire course of antibiotics.  E. Be sure to call and notify provider if you develop a high fever, your rash gets worse, or your right leg feels worse.  2. Cough and nasal drainage: suspect due to allergies, and post nasal drip could be making asthma symptoms worse.  A. Take the flonase nasal spray: one spray in each nostril every night at bedtime.  B. It may take about a week for this to start working, but call if symptoms are worse or not improved after a week or so.

## 2010-09-22 NOTE — Assessment & Plan Note (Signed)
1. Rash and swelling of posterior RLE (calf): acute, suspect cellulitis or tick-borne illness.  A. Prescribed doxycycline 100mg  tablets- take 1 tablet every 12 hours for 2 weeks. Disp#28.    B. Pt instructed to wear sunscreen when outside- as doxycycline can make skin more photosensitive/prone to sunburn.  C. Discussed interaction between doxycycline and warfarin- can increase clotting time, and put pt at risk for bleeding.  Pt is scheduled for PT/INR check on Tuesday 09/26/10.  Instructed to monitor for s/s of spontaneous bleeding (nose bleeds, bleeding gums, easy bruising, blood in urine or stool)- and to notify provider if she experiences these.  Also instructed pt to notify Kendal Hymen, when she is measuring PT/INR on Tuesday, that she has added this medication and it can increase INR level.  D. Instructed pt to call provider if rash worsens, leg feels worse/looks worse, or if she gets a high fever and /or concerning symptoms.  E. Instructed pt to ask for Luretha Murphy, FNP, on Tuesday, so she can assess her leg.  Also instructed pt to schedule follow-up appt in 2 weeks for now with PCP Sarah Swaziland, MD-- this was suggested to improve continuity of care.

## 2010-09-22 NOTE — Assessment & Plan Note (Addendum)
1. Cough with nasal secretions: persistent, suspect related to post-nasal drip.  A. Prescribed flonase nasal spray- 1 spray in each nostril every night at bedtime.   B. Demonstrated how to perform this, and suggested if pt has more questions, to ask pharmacist to demonstrate how to use nasal spray.  C. Instructed pt that it may take about one week for onset of clinical effects, and she should call if her symptoms have not improved or have worsened after about 1 week.

## 2010-09-22 NOTE — Telephone Encounter (Signed)
Returned Dr. Laurie Panda call and is scheduling an appt with Dr. Swaziland 7/9 at 8:45.

## 2010-09-26 ENCOUNTER — Ambulatory Visit (INDEPENDENT_AMBULATORY_CARE_PROVIDER_SITE_OTHER): Payer: No Typology Code available for payment source | Admitting: *Deleted

## 2010-09-26 ENCOUNTER — Other Ambulatory Visit: Payer: Self-pay | Admitting: Family Medicine

## 2010-09-26 DIAGNOSIS — Z7901 Long term (current) use of anticoagulants: Secondary | ICD-10-CM

## 2010-09-26 DIAGNOSIS — I2699 Other pulmonary embolism without acute cor pulmonale: Secondary | ICD-10-CM

## 2010-09-26 DIAGNOSIS — I872 Venous insufficiency (chronic) (peripheral): Secondary | ICD-10-CM | POA: Insufficient documentation

## 2010-09-26 MED ORDER — TRIAMCINOLONE ACETONIDE 0.1 % EX CREA: TOPICAL_CREAM | Freq: Two times a day (BID) | CUTANEOUS | Status: DC

## 2010-09-26 NOTE — Progress Notes (Signed)
Rash still present on back of right leg, increasing in diameter, she states that it itches.  Edema 3+ hard.  Add compression stockings during the day and topical cortisone cream bid.

## 2010-09-27 ENCOUNTER — Ambulatory Visit: Payer: No Typology Code available for payment source

## 2010-09-27 ENCOUNTER — Telehealth: Payer: Self-pay | Admitting: Family Medicine

## 2010-09-27 NOTE — Telephone Encounter (Signed)
states that she is peeing blood and she is on warferin.  She needs to talk to nurse

## 2010-09-27 NOTE — Telephone Encounter (Signed)
Reports that her urine is dark brown in color and she concerned b/c she takes Coumadin.  Also reports dysuria.  Advised her that she needs to be seen.  Gave her a [place] appointment for tomorrow am.

## 2010-09-28 ENCOUNTER — Encounter: Payer: Self-pay | Admitting: Family Medicine

## 2010-09-28 ENCOUNTER — Ambulatory Visit (INDEPENDENT_AMBULATORY_CARE_PROVIDER_SITE_OTHER): Payer: No Typology Code available for payment source | Admitting: Family Medicine

## 2010-09-28 DIAGNOSIS — L0291 Cutaneous abscess, unspecified: Secondary | ICD-10-CM

## 2010-09-28 DIAGNOSIS — I8310 Varicose veins of unspecified lower extremity with inflammation: Secondary | ICD-10-CM

## 2010-09-28 DIAGNOSIS — N390 Urinary tract infection, site not specified: Secondary | ICD-10-CM

## 2010-09-28 DIAGNOSIS — L0390 Cellulitis, unspecified: Secondary | ICD-10-CM

## 2010-09-28 DIAGNOSIS — R319 Hematuria, unspecified: Secondary | ICD-10-CM

## 2010-09-28 LAB — POCT URINALYSIS DIPSTICK
Bilirubin, UA: NEGATIVE
Ketones, UA: NEGATIVE
Leukocytes, UA: NEGATIVE
Spec Grav, UA: <=1.005
pH, UA: 5

## 2010-09-28 LAB — POCT UA - MICROSCOPIC ONLY

## 2010-09-28 MED ORDER — CEPHALEXIN 500 MG PO CAPS: 500.0000 mg | ORAL_CAPSULE | Freq: Three times a day (TID) | ORAL | Status: DC

## 2010-09-28 NOTE — Assessment & Plan Note (Signed)
See above for cellulitis Will also use ACE wrap to help with fluid, pt has compression stockings ordered

## 2010-09-28 NOTE — Patient Instructions (Signed)
Continue the doxycycline  Start the Keflex three times a day for 7 days Use the ACE wrap until your stockings come in You can use Aveeno or any anti-itch lotion If you have urine with large amounts of blood again call back for instructions on your Warfarin If you get fever, severe pain go to ER if needed

## 2010-09-28 NOTE — Assessment & Plan Note (Signed)
There is a concern that this may be a cellulitis on LE in setting of chronic venous statis and dermatitis. Will add Keflex to current doxycyline, considered bactrim but pt will likley have too many problems with Coumadin Precepted with previous provider who saw pt.

## 2010-09-28 NOTE — Assessment & Plan Note (Signed)
Will treat based on symptoms, Dipstick read by machine as negative, however strip was positive for mod Leukocyte esterase

## 2010-09-28 NOTE — Progress Notes (Signed)
  Subjective:    Patient ID: Allison [name], female    DOB: 1955-10-22, 55 y.o.   MRN: [id number]  HPI Yesterday noticed blood in urine, no burning sensation, but lower abdominal pain, also on doxycycline for cellulitis of lower ext - has 3 days left . Temp 31F, no nausea/Vomiting, denies any bleeding from vagina  This AM had speckles of blood in urine no gross bleeding Approx 1 year had a similar had problem 1 year ago and had UTI- reviewed chart has had many UTI in past year approx 4-5  Hysterectomy 2 months ago  No change in bowels  Rash on leg spreading since last week, despite the doxycyline, painful to touch and feels like it has fever in it, +pruritis  Review of Systems per above, no fever, or chills     Objective:   Physical Exam    GEN- obese, NAD, alert and oriented    ABD- Soft, NABS, mild TTP in lower quadrants and suprapubic region, no rebound, no gaurding, no CVA tenderness   EXT- RLE 2+ edema, large 11x18cm area of induration, mild TTP, +warmth         Assessment & Plan:

## 2010-09-28 NOTE — Assessment & Plan Note (Signed)
Hematuria present, pt has had in the past and has seen urology with negative work-up per report. I only saw a 2 urology notes. Will treat infection, if bleeding reoccurs as now resolved, pt should hold coumadin, consider CT scan for renal stones as well. On her return visit needs a repeat UA. Will discuss with PCP repeat urology referral. Her INR was recently 3.2

## 2010-10-09 ENCOUNTER — Encounter: Payer: Self-pay | Admitting: Family Medicine

## 2010-10-09 ENCOUNTER — Ambulatory Visit (INDEPENDENT_AMBULATORY_CARE_PROVIDER_SITE_OTHER): Payer: BC Managed Care – PPO | Admitting: Family Medicine

## 2010-10-09 ENCOUNTER — Ambulatory Visit (INDEPENDENT_AMBULATORY_CARE_PROVIDER_SITE_OTHER): Payer: BC Managed Care – PPO | Admitting: *Deleted

## 2010-10-09 VITALS — BP 137/80 | HR 107 | Temp 98.5°F | Ht 64.5 in | Wt 300.8 lb

## 2010-10-09 DIAGNOSIS — E039 Hypothyroidism, unspecified: Secondary | ICD-10-CM

## 2010-10-09 DIAGNOSIS — E118 Type 2 diabetes mellitus with unspecified complications: Secondary | ICD-10-CM

## 2010-10-09 DIAGNOSIS — R0600 Dyspnea, unspecified: Secondary | ICD-10-CM

## 2010-10-09 DIAGNOSIS — E119 Type 2 diabetes mellitus without complications: Secondary | ICD-10-CM

## 2010-10-09 DIAGNOSIS — R300 Dysuria: Secondary | ICD-10-CM

## 2010-10-09 DIAGNOSIS — I2699 Other pulmonary embolism without acute cor pulmonale: Secondary | ICD-10-CM

## 2010-10-09 DIAGNOSIS — Z7901 Long term (current) use of anticoagulants: Secondary | ICD-10-CM

## 2010-10-09 DIAGNOSIS — N390 Urinary tract infection, site not specified: Secondary | ICD-10-CM

## 2010-10-09 DIAGNOSIS — R0989 Other specified symptoms and signs involving the circulatory and respiratory systems: Secondary | ICD-10-CM

## 2010-10-09 LAB — POCT URINALYSIS DIPSTICK
Nitrite, UA: NEGATIVE
Protein, UA: NEGATIVE
Spec Grav, UA: 1.025
Urobilinogen, UA: 0.2

## 2010-10-09 MED ORDER — ALPRAZOLAM 0.5 MG PO TABS: 0.5000 mg | ORAL_TABLET | Freq: Three times a day (TID) | ORAL | Status: DC | PRN

## 2010-10-09 NOTE — Patient Instructions (Signed)
We will check your thyroid, diabetes, urine and heart today. If the breathing gets worse, or if you have chest pain with it, please call us or go to the ER. You might try to slowly increase your walking. If the itching does not get better, call me and let me know.  I will call in the other medicine.

## 2010-10-10 DIAGNOSIS — E039 Hypothyroidism, unspecified: Secondary | ICD-10-CM | POA: Insufficient documentation

## 2010-10-10 DIAGNOSIS — R0600 Dyspnea, unspecified: Secondary | ICD-10-CM | POA: Insufficient documentation

## 2010-10-10 LAB — BRAIN NATRIURETIC PEPTIDE: Brain Natriuretic Peptide: 5.5 pg/mL (ref 0.0–100.0)

## 2010-10-10 NOTE — Assessment & Plan Note (Signed)
UA not indicative if current infection.  Will send for cx as pt still symptomatic.

## 2010-10-10 NOTE — Assessment & Plan Note (Signed)
Check TSH today

## 2010-10-10 NOTE — Assessment & Plan Note (Signed)
Likely due to increasing weight and deconditioning.  Will check BNP today for possible CHF, but no other s/sx on hx or exam.  Encouraged to slowly increase exercise.

## 2010-10-10 NOTE — Assessment & Plan Note (Signed)
Pt with glucose in urine.  Due for HgbA1c.  Check today.

## 2010-10-10 NOTE — Assessment & Plan Note (Signed)
Pt interested in losing weight, but cost is an issue, and she declined seeing nutritionist.  She feels she knows what to do.  Advised to increase exercise slowly.

## 2010-10-10 NOTE — Progress Notes (Signed)
  Subjective:    Patient ID: Allison [name], female    DOB: 11-24-1955, 55 y.o.   MRN: [id number]  HPI 55 yo with multiple medical problems presents with several concerns:  Seen recently for leg pain and swelling.  Feels better now.  Still on warfarin.  Recent UTI - still with some pain before and after urinating, but much better.  + frequency and small amounts of urine.  Denies hematuria, back pain and fevers.  Feel symptoms improved after antibiotics.  Exercise intolerance.  When exercising (walking from parking lot into [place]), she feels short of breath, sweaty, maybe wheezes.  Denies chest pain. Denies any change in orthopnea.  No PND.   Does have pain in knees that is so bad she has to hold on to a cart to keep from having her knees give out.  Did have URI recently, but this is much better with resolution of her cough, but still with some some sinus congestion and drainage down her throat.  Weight gain.  Would like to exercise more, but having trouble.  Feels she has a poor diet.  Daughter has also gained weight and is motivated to lose weight as well.    Wondering if she should have thyroid checked.    Review of Systems  See HPI     Objective:   Physical Exam  Constitutional: No distress.       Overweight  HENT:       Diffusely thinning hair.  Eyes: Conjunctivae are normal. Right eye exhibits no discharge. Left eye exhibits no discharge. No scleral icterus.  Cardiovascular: Normal rate, regular rhythm and normal heart sounds.  Exam reveals no gallop and no friction rub.   No murmur heard. Pulmonary/Chest: Effort normal and breath sounds normal. No respiratory distress. She has no wheezes. She has no rales. She exhibits no tenderness.  Musculoskeletal:       BLE with trace edema.  No erythema or warmth.  Skin: She is not diaphoretic.          Assessment & Plan:

## 2010-10-11 ENCOUNTER — Encounter: Payer: Self-pay | Admitting: Family Medicine

## 2010-10-11 ENCOUNTER — Telehealth: Payer: Self-pay | Admitting: Family Medicine

## 2010-10-11 MED ORDER — GLYBURIDE 5 MG PO TABS: 5.0000 mg | ORAL_TABLET | Freq: Two times a day (BID) | ORAL | Status: DC

## 2010-10-11 NOTE — Telephone Encounter (Signed)
Left message for pt to increase her glyburide to 5 mg twice a day.  All other labs were fine.  Will send rx to her pharmacy for the higher dose, but she may take 2 of the 2.5 mg tablets that she has at home until she runs out.

## 2010-10-22 ENCOUNTER — Other Ambulatory Visit: Payer: Self-pay | Admitting: Family Medicine

## 2010-10-22 NOTE — Telephone Encounter (Signed)
Refill request

## 2010-10-23 ENCOUNTER — Other Ambulatory Visit: Payer: Self-pay | Admitting: Family Medicine

## 2010-10-23 ENCOUNTER — Ambulatory Visit (INDEPENDENT_AMBULATORY_CARE_PROVIDER_SITE_OTHER): Payer: BC Managed Care – PPO | Admitting: *Deleted

## 2010-10-23 DIAGNOSIS — Z7901 Long term (current) use of anticoagulants: Secondary | ICD-10-CM

## 2010-10-23 DIAGNOSIS — I2699 Other pulmonary embolism without acute cor pulmonale: Secondary | ICD-10-CM

## 2010-10-23 MED ORDER — FLUCONAZOLE 150 MG PO TABS: 150.0000 mg | ORAL_TABLET | Freq: Once | ORAL | Status: AC

## 2010-10-23 MED ORDER — ALBUTEROL 90 MCG/ACT IN AERS: 2.0000 | INHALATION_SPRAY | Freq: Four times a day (QID) | RESPIRATORY_TRACT | Status: DC | PRN

## 2010-10-23 NOTE — Progress Notes (Signed)
Pt needs Rx for diflucan and refill of albuterol

## 2010-10-25 NOTE — Progress Notes (Signed)
Spoke with pt today.  She is feeling better after first dose of fluconazole, but feels she may need the second dose as well.  We have decreased her warfarin, and she is coming in on Friday for a recheck.  If she does take the second fluconazole dose and her INR is at goal on Friday, I would suggest that she take 5 mg on Friday, 2.5 on Saturday, 5 on Sunday and come for a recheck on Monday.

## 2010-10-27 ENCOUNTER — Ambulatory Visit (INDEPENDENT_AMBULATORY_CARE_PROVIDER_SITE_OTHER): Payer: BC Managed Care – PPO | Admitting: *Deleted

## 2010-10-27 DIAGNOSIS — I2699 Other pulmonary embolism without acute cor pulmonale: Secondary | ICD-10-CM

## 2010-10-27 DIAGNOSIS — Z7901 Long term (current) use of anticoagulants: Secondary | ICD-10-CM

## 2010-10-30 ENCOUNTER — Ambulatory Visit (INDEPENDENT_AMBULATORY_CARE_PROVIDER_SITE_OTHER): Payer: BC Managed Care – PPO | Admitting: *Deleted

## 2010-10-30 DIAGNOSIS — I2699 Other pulmonary embolism without acute cor pulmonale: Secondary | ICD-10-CM

## 2010-10-30 DIAGNOSIS — Z7901 Long term (current) use of anticoagulants: Secondary | ICD-10-CM

## 2010-10-30 LAB — POCT INR: INR: 2.2

## 2010-11-06 ENCOUNTER — Other Ambulatory Visit: Payer: Self-pay | Admitting: Family Medicine

## 2010-11-07 NOTE — Telephone Encounter (Signed)
Refill request

## 2010-11-13 ENCOUNTER — Ambulatory Visit (INDEPENDENT_AMBULATORY_CARE_PROVIDER_SITE_OTHER): Payer: BC Managed Care – PPO | Admitting: *Deleted

## 2010-11-13 ENCOUNTER — Ambulatory Visit (INDEPENDENT_AMBULATORY_CARE_PROVIDER_SITE_OTHER): Payer: BC Managed Care – PPO | Admitting: Family Medicine

## 2010-11-13 ENCOUNTER — Encounter: Payer: Self-pay | Admitting: Family Medicine

## 2010-11-13 VITALS — BP 100/66 | HR 101 | Temp 97.5°F | Wt 297.0 lb

## 2010-11-13 DIAGNOSIS — R21 Rash and other nonspecific skin eruption: Secondary | ICD-10-CM | POA: Insufficient documentation

## 2010-11-13 DIAGNOSIS — I2699 Other pulmonary embolism without acute cor pulmonale: Secondary | ICD-10-CM

## 2010-11-13 DIAGNOSIS — Z7901 Long term (current) use of anticoagulants: Secondary | ICD-10-CM

## 2010-11-13 LAB — POCT INR: INR: 3

## 2010-11-13 LAB — POCT SKIN KOH: Skin KOH, POC: NEGATIVE

## 2010-11-13 MED ORDER — TRIAMCINOLONE ACETONIDE 0.5 % EX OINT: TOPICAL_OINTMENT | Freq: Two times a day (BID) | CUTANEOUS | Status: DC

## 2010-11-13 NOTE — Patient Instructions (Signed)
Put the ointment on your foot 2 times per day until the area clears, then continue using it for another 3-4 days. Keep your foot and leg elevated. Once the rash clears and you can tolerate it, please start wearing the compression stockings as this pain is likely partially from the chronic swelling in that right foot.  Come back and see Dr. Swaziland if your foot does not get any better in the next 2 weeks or if your pain worsens.

## 2010-11-13 NOTE — Progress Notes (Signed)
S: Pt comes in today for rash on her right foot.  Has been occuring for ~1 week but got acutely worse over the weekend.  Cannot think of any injury or causative event. Anything touching the top of her foot makes the pain worse, to the point where she is needing to wear her bedroom slippers b/c her normal canvas shoes hurt too much to pull over.  Has not been wearing any new shoes, does not wear flip flops or shoes with metal buckles or clips on them.  Sheets brushing over the foot also make the pain worse, which is a sharp pain, achy at times when walking, with an occasionally feeling pf pins and needles and cramping in the ends of her toes.  Tried tylenol which did not help.  No itching. No rashes anywhere else.  Only some what similar rash was on her right lower leg when she had cellulitis a few weeks ago, but that felt entirely different than this, this is significantly more painful.  No fevers, chills, nausea, vomiting, or other constitutional symptoms.    ROS: Per HPI  History  Smoking status  . Never Smoker   Smokeless tobacco  . Never Used    O:  Filed Vitals:   11/13/10 0923  BP: 100/66  Pulse: 101  Temp: 97.5 F (36.4 C)    Gen: NAD, obvious pain in right foot with walking and movement of foot Ext: mild 1+ pitting edema RLE up to mid calf, including foot, no edema of LLE; 3cm erythematous area with multiple 1-78mm papules on dorsum of foot just proximal to 2nd and 3rd toes; significant TTP over dorsum of food; chronic venous stasis changes including hemosiderin deposits over more proximal dorsal portion of right foot and on lateral aspect, all of which were non-tender   A/P: 55 y.o. female p/w likely eczematous rash over dorsal right foot -See problem list -f/u with Dr. Swaziland as already scheduled.

## 2010-11-13 NOTE — Assessment & Plan Note (Signed)
Rash itself is most c/w eczema/atopic dermatitis.  Will try triamcinolone ointment BID until it clears.  KOH prep was negative.  Pain may be partially associated with the dermatitis but may also have a venous-stasis component.  Will have PCP follow foot pain closely. No pin-point TTP or obvious deformity or area of injury.

## 2010-11-16 ENCOUNTER — Other Ambulatory Visit: Payer: Self-pay | Admitting: Family Medicine

## 2010-11-16 DIAGNOSIS — E7800 Pure hypercholesterolemia, unspecified: Secondary | ICD-10-CM

## 2010-11-16 DIAGNOSIS — I10 Essential (primary) hypertension: Secondary | ICD-10-CM

## 2010-11-20 ENCOUNTER — Ambulatory Visit (INDEPENDENT_AMBULATORY_CARE_PROVIDER_SITE_OTHER): Payer: BC Managed Care – PPO | Admitting: Family Medicine

## 2010-11-20 ENCOUNTER — Ambulatory Visit (HOSPITAL_COMMUNITY)
Admission: RE | Admit: 2010-11-20 | Discharge: 2010-11-20 | Disposition: A | Discharge status: Home / Self Care | Payer: BC Managed Care – PPO | Source: Ambulatory Visit | Attending: Family Medicine | Admitting: Family Medicine

## 2010-11-20 ENCOUNTER — Encounter: Payer: Self-pay | Admitting: Family Medicine

## 2010-11-20 DIAGNOSIS — E119 Type 2 diabetes mellitus without complications: Secondary | ICD-10-CM

## 2010-11-20 DIAGNOSIS — M25579 Pain in unspecified ankle and joints of unspecified foot: Secondary | ICD-10-CM | POA: Insufficient documentation

## 2010-11-20 DIAGNOSIS — Z7901 Long term (current) use of anticoagulants: Secondary | ICD-10-CM

## 2010-11-20 DIAGNOSIS — I2699 Other pulmonary embolism without acute cor pulmonale: Secondary | ICD-10-CM

## 2010-11-20 DIAGNOSIS — M79609 Pain in unspecified limb: Secondary | ICD-10-CM

## 2010-11-20 DIAGNOSIS — D649 Anemia, unspecified: Secondary | ICD-10-CM

## 2010-11-20 DIAGNOSIS — M79671 Pain in right foot: Secondary | ICD-10-CM

## 2010-11-20 DIAGNOSIS — M7730 Calcaneal spur, unspecified foot: Secondary | ICD-10-CM | POA: Insufficient documentation

## 2010-11-20 NOTE — Patient Instructions (Signed)
We will check the x ray of your foot.  You can get this done at the hospital in radiology.  We will check your glucose today.

## 2010-11-21 ENCOUNTER — Encounter: Payer: Self-pay | Admitting: Family Medicine

## 2010-11-22 NOTE — Progress Notes (Signed)
  Subjective:    Patient ID: Allison [name], female    DOB: [birth year], 55 y.o.   MRN: [id number]  HPI 55 yo female seen recently for foot pain with a rash.  Treated for eczema.  Rash improving, but pain continues.  Pain in dorsum of foot and along lateral border.  Pt unable to tolerate regular shoes.  Pain with walking, pain same for 2 weeks.  Denies fevers and chills.  Takes tylenol for pain with minimal relief.  Has stronger meds, but they make her sick.  Has increased her glyburide as requested, but unable to check sugars at home as meter is broken.  Will check today with random glucose.     Review of Systems See HPI    Objective:   Physical Exam  Obese, pleasant.  NO distress, but uncomfortable with foot pain. EXT: Mild erythema, + edema of foot, no warmth.  Very TTP, especially dorsum of foot. 2 cm erythematous plaque on dorsum of foot at base of 2nd and 3rd toes.        Assessment & Plan:  1) Foot pain - DDx includes LisFranc Fx, though no known trauma.  Also consider cellulitis vs gout, though atypical presentation of either of these conditions.  Will check plain films.  F/u when results are available. 2) h/o anemia - check hgb today. 3) DM2 - check random glucose today.  Too early for HbgA1c.

## 2010-11-23 ENCOUNTER — Telehealth: Payer: Self-pay | Admitting: Family Medicine

## 2010-11-23 MED ORDER — COLCHICINE 0.6 MG PO TABS: ORAL_TABLET | ORAL | Status: DC

## 2010-11-23 NOTE — Telephone Encounter (Signed)
Spoke with pt.  She continues to have pain-same as before.  No change in skin.  No fevers/chills. Will try course of colchicine for possible gout.  Pt on warfarin and many other gout meds contraindicated.  Pt to contact us if colchicine is too expensive.  If no better by Monday, pt to contact us.

## 2010-11-23 NOTE — Telephone Encounter (Signed)
Was here on the 20th and wants to know results of her tests.

## 2010-11-27 ENCOUNTER — Ambulatory Visit (INDEPENDENT_AMBULATORY_CARE_PROVIDER_SITE_OTHER): Payer: BC Managed Care – PPO | Admitting: *Deleted

## 2010-11-27 DIAGNOSIS — I2699 Other pulmonary embolism without acute cor pulmonale: Secondary | ICD-10-CM

## 2010-11-27 DIAGNOSIS — Z7901 Long term (current) use of anticoagulants: Secondary | ICD-10-CM

## 2010-12-25 ENCOUNTER — Ambulatory Visit (INDEPENDENT_AMBULATORY_CARE_PROVIDER_SITE_OTHER): Payer: BC Managed Care – PPO | Admitting: *Deleted

## 2010-12-25 ENCOUNTER — Other Ambulatory Visit: Payer: Self-pay | Admitting: Family Medicine

## 2010-12-25 DIAGNOSIS — Z7901 Long term (current) use of anticoagulants: Secondary | ICD-10-CM

## 2010-12-25 DIAGNOSIS — Z23 Encounter for immunization: Secondary | ICD-10-CM

## 2010-12-25 DIAGNOSIS — I2699 Other pulmonary embolism without acute cor pulmonale: Secondary | ICD-10-CM

## 2010-12-25 LAB — POCT INR: INR: 2.4

## 2010-12-25 LAB — GLUCOSE, CAPILLARY: Glucose-Capillary: 132 mg/dL — ABNORMAL HIGH (ref 70–99)

## 2011-01-10 ENCOUNTER — Other Ambulatory Visit: Payer: Self-pay | Admitting: Family Medicine

## 2011-01-11 NOTE — Telephone Encounter (Signed)
Refill request

## 2011-01-12 ENCOUNTER — Other Ambulatory Visit: Payer: Self-pay | Admitting: Family Medicine

## 2011-01-12 NOTE — Telephone Encounter (Signed)
Refill request

## 2011-01-15 NOTE — Telephone Encounter (Signed)
Refill for Xanax 0.5mg  called in to [facility] on [address] in [place]. [phone]. Ileana Ladd

## 2011-01-18 ENCOUNTER — Other Ambulatory Visit: Payer: Self-pay | Admitting: Family Medicine

## 2011-01-18 NOTE — Telephone Encounter (Signed)
Refill request

## 2011-01-22 ENCOUNTER — Ambulatory Visit (INDEPENDENT_AMBULATORY_CARE_PROVIDER_SITE_OTHER): Payer: BC Managed Care – PPO | Admitting: *Deleted

## 2011-01-22 ENCOUNTER — Other Ambulatory Visit: Payer: Self-pay | Admitting: Family Medicine

## 2011-01-22 DIAGNOSIS — I2699 Other pulmonary embolism without acute cor pulmonale: Secondary | ICD-10-CM

## 2011-01-22 DIAGNOSIS — I10 Essential (primary) hypertension: Secondary | ICD-10-CM

## 2011-01-22 DIAGNOSIS — Z7901 Long term (current) use of anticoagulants: Secondary | ICD-10-CM

## 2011-01-22 MED ORDER — GLYBURIDE 5 MG PO TABS: 5.0000 mg | ORAL_TABLET | Freq: Two times a day (BID) | ORAL | Status: DC

## 2011-01-22 MED ORDER — VERAPAMIL HCL ER 120 MG PO TBCR: 120.0000 mg | EXTENDED_RELEASE_TABLET | Freq: Every day | ORAL | Status: DC

## 2011-01-22 MED ORDER — LEVOTHYROXINE SODIUM 125 MCG PO TABS: 125.0000 ug | ORAL_TABLET | Freq: Every day | ORAL | Status: DC

## 2011-01-30 ENCOUNTER — Other Ambulatory Visit: Payer: Self-pay | Admitting: Family Medicine

## 2011-01-30 NOTE — Telephone Encounter (Signed)
Refill request

## 2011-02-01 ENCOUNTER — Ambulatory Visit (INDEPENDENT_AMBULATORY_CARE_PROVIDER_SITE_OTHER): Payer: BC Managed Care – PPO | Admitting: Family Medicine

## 2011-02-01 ENCOUNTER — Encounter: Payer: Self-pay | Admitting: Family Medicine

## 2011-02-01 VITALS — BP 124/82 | HR 121 | Temp 98.3°F | Ht 64.6 in | Wt 291.0 lb

## 2011-02-01 DIAGNOSIS — E118 Type 2 diabetes mellitus with unspecified complications: Secondary | ICD-10-CM

## 2011-02-01 DIAGNOSIS — E1165 Type 2 diabetes mellitus with hyperglycemia: Secondary | ICD-10-CM

## 2011-02-01 DIAGNOSIS — R300 Dysuria: Secondary | ICD-10-CM

## 2011-02-01 DIAGNOSIS — N390 Urinary tract infection, site not specified: Secondary | ICD-10-CM

## 2011-02-01 LAB — POCT URINALYSIS DIPSTICK
Nitrite, UA: NEGATIVE
Protein, UA: 30
Urobilinogen, UA: 0.2
pH, UA: 5.5

## 2011-02-01 LAB — POCT UA - MICROSCOPIC ONLY

## 2011-02-01 LAB — POCT GLYCOSYLATED HEMOGLOBIN (HGB A1C): Hemoglobin A1C: 6.8

## 2011-02-01 MED ORDER — CEPHALEXIN 500 MG PO CAPS: 500.0000 mg | ORAL_CAPSULE | Freq: Three times a day (TID) | ORAL | Status: AC

## 2011-02-01 NOTE — Patient Instructions (Signed)
We will culture the urine sample and call or send letter with results. Take Keflex three times a day x 10 days. Please schedule follow up appointment in 2 weeks to repeat urinalysis. If you develop high fevers, nausea/vomiting, worsening back/pelvic pain, please return to clinic or go to ED. It was good to meet you. Feel better soon.  Urinary Tract Infection Infections of the urinary tract can start in several places. A bladder infection (cystitis), a kidney infection (pyelonephritis), and a prostate infection (prostatitis) are different types of urinary tract infections (UTIs). They usually get better if treated with medicines (antibiotics) that kill germs. Take all the medicine until it is gone. You or your child may feel better in a few days, but TAKE ALL MEDICINE or the infection may not respond and may become more difficult to treat. HOME CARE INSTRUCTIONS   Drink enough water and fluids to keep the urine clear or pale yellow. Cranberry juice is especially recommended, in addition to large amounts of water.   Avoid caffeine, tea, and carbonated beverages. They tend to irritate the bladder.   Alcohol may irritate the prostate.   Only take over-the-counter or prescription medicines for pain, discomfort, or fever as directed by your caregiver.  To prevent further infections:  Empty the bladder often. Avoid holding urine for long periods of time.   After a bowel movement, women should cleanse from front to back. Use each tissue only once.   Empty the bladder before and after sexual intercourse.  FINDING OUT THE RESULTS OF YOUR TEST Not all test results are available during your visit. If your or your child's test results are not back during the visit, make an appointment with your caregiver to find out the results. Do not assume everything is normal if you have not heard from your caregiver or the medical facility. It is important for you to follow up on all test results. SEEK MEDICAL  CARE IF:   There is back pain.   Your baby is older than 3 months with a rectal temperature of 100.5 F (38.1 C) or higher for more than 1 day.   Your or your child's problems (symptoms) are no better in 3 days. Return sooner if you or your child is getting worse.  SEEK IMMEDIATE MEDICAL CARE IF:   There is severe back pain or lower abdominal pain.   You or your child develops chills.   You have a fever.   Your baby is older than 3 months with a rectal temperature of 102 F (38.9 C) or higher.   Your baby is 59 months old or younger with a rectal temperature of 100.4 F (38 C) or higher.   There is nausea or vomiting.   There is continued burning or discomfort with urination.  MAKE SURE YOU:   Understand these instructions.   Will watch your condition.   Will get help right away if you are not doing well or get worse.  Document Released: 12/27/2004 Document Revised: 11/29/2010 Document Reviewed: 08/01/2006 [facility] Patient Information 2012 [place], [place].

## 2011-02-01 NOTE — Progress Notes (Signed)
  Subjective:    Patient ID: Allison [name], female    DOB: 07/22/1955, 55 y.o.   MRN: [id number]  HPI  Patient presents to clinic with 2-3 day history of lower abdominal pain.  Described as dull and aching.  One day ago, patient's urine was dark brown and had a strong, foul odor.  Complains of dysuria and burning with urination.  She says this is her typical presentation of a bladder infection.  She is on chronic coumadin but denied any gross blood in urine or blood clots.    Denies any low back of flank pain.  Denies any vaginal itching or discharge.  Denies fever, chills, night sweats, nausea/vomiting.  Lower abdominal pain is intermittent and patient can tolerate pain without any medication.  Normal appetite and urine output.   Review of Systems  Per HPI   Objective:   Physical Exam  General: in no acute distress Abdomen: soft, obese, non-tender, ND, + BS, no guarding or rebound MSK: lumbar spine non-tender on palpation, negative Lloyd's sign     Assessment & Plan:

## 2011-02-01 NOTE — Assessment & Plan Note (Signed)
A1c due today - 6.8.  Significantly lower than recent value in August - 11.1.  Encouraged patient to continue current regimen, continue to exercise and eat a CHO-modified diet.  Follow up with PCP in 2 weeks.

## 2011-02-01 NOTE — Assessment & Plan Note (Addendum)
UA positive for large blood and small leukocytes.  Patient symptomatic - will treat with Keflex 500 TID x 10 day course.  Advised patient to return to PCP in 2 weeks for repeat UA/culture and follow-up.  Large blood concerning for kidney stones vs. Adverse effect of warfarin.  However, patient does not endorse gross blood in urine and denies any low back or flank pain.  Red flags reviewed - return to clinic if develops high fever, chills, nausea/vomiting, large amount of blood in urine, or worsening pain.  Follow up with PCP in 2 weeks.

## 2011-02-15 ENCOUNTER — Encounter: Payer: Self-pay | Admitting: Family Medicine

## 2011-02-15 ENCOUNTER — Ambulatory Visit (INDEPENDENT_AMBULATORY_CARE_PROVIDER_SITE_OTHER): Payer: BC Managed Care – PPO | Admitting: Family Medicine

## 2011-02-15 VITALS — BP 159/85 | HR 108 | Temp 97.9°F | Ht 64.5 in | Wt 289.0 lb

## 2011-02-15 DIAGNOSIS — B373 Candidiasis of vulva and vagina: Secondary | ICD-10-CM

## 2011-02-15 DIAGNOSIS — I2699 Other pulmonary embolism without acute cor pulmonale: Secondary | ICD-10-CM

## 2011-02-15 DIAGNOSIS — N390 Urinary tract infection, site not specified: Secondary | ICD-10-CM

## 2011-02-15 DIAGNOSIS — R300 Dysuria: Secondary | ICD-10-CM

## 2011-02-15 DIAGNOSIS — E1165 Type 2 diabetes mellitus with hyperglycemia: Secondary | ICD-10-CM

## 2011-02-15 DIAGNOSIS — E118 Type 2 diabetes mellitus with unspecified complications: Secondary | ICD-10-CM

## 2011-02-15 LAB — POCT URINALYSIS DIPSTICK
Glucose, UA: NEGATIVE
Protein, UA: NEGATIVE
Spec Grav, UA: 1.01
Urobilinogen, UA: 0.2
pH, UA: 5.5

## 2011-02-15 LAB — POCT UA - MICROSCOPIC ONLY

## 2011-02-15 MED ORDER — FLUCONAZOLE 150 MG PO TABS: 150.0000 mg | ORAL_TABLET | Freq: Once | ORAL | Status: AC

## 2011-02-15 NOTE — Patient Instructions (Signed)
I will send the prescription for the fluconazole. You should adjust your warfarin.  Take 5 mg tonight, 2.5 on Friday, Saturday and Sunday , then 5 on Monday, and 2.5 on Wednesday and come for a recheck on Thursday to make sure everything is going okay. Since your pain has improved a bit, I would like to wait on other antibiotics for the urine until we get the culture back.  This should happen soon, and I will call you with the results and we can make a plan. If you develop back pain, more bleeding in your urine, fevers or chills, please call us.

## 2011-02-15 NOTE — Progress Notes (Signed)
  Subjective:    Patient ID: TAYTE CHILDERS, female    DOB: [birth year], 55 y.o.   MRN: [id number]  HPI 55 year old female with diabetes currently on warfarin for a history of PE here for followup of UTI. 2 weeks ago patient noted hematuria painful urination, burning abdominal pain, and frequency. She was seen at the family practice center and treated with 10 days of cephalexin. Her pain has improved. She continues to have frequency. She urinates every hour. She denies fevers or chills. No further hematuria. She drinks a lot of water. She also drinks occasional sugar-free Kool-Aid. Ms. Loudon is also noticed vaginal itching consistent with previous yeast infections and treated with fluconazole with relief. Diabetes. Ms. Deskins is not checking her sugars at home. She does not have a meter but her hemoglobin A1c at last visit was bullous up in.    Review of Systems she denies chest pain or shortness of breath she has no back pain     Objective:   Physical Exam  Constitutional: She appears well-developed and well-nourished. No distress.       Obese   Musculoskeletal:       Back:  No CVAT  Skin: She is diaphoretic.          Assessment & Plan:

## 2011-02-15 NOTE — Assessment & Plan Note (Signed)
Patient with symptoms consistent of previous infections we will treat with fluconazole x2 if needed. Warfarin therapy was altered to accommodate her fluconazole.

## 2011-02-15 NOTE — Assessment & Plan Note (Signed)
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 She is now status post hysterectomy. She has followup on December 5 with her gyn oncologist. She will discuss at that time if it's possible to stop her anticoagulation.

## 2011-02-15 NOTE — Assessment & Plan Note (Signed)
Patient with good control per her hemoglobin A1c. She is not checking her sugars at home as she has been meter. Suggested diabetic education. She is unable to attend due to finances and time constraints.

## 2011-02-15 NOTE — Assessment & Plan Note (Signed)
Allison [name] is still feeling symptomatic from her UTI. However she has improved. UA today revealsmoderate blood moderate leukocytes but negative nitrite. We will wait on prescribing further antibiotics until culture is back. Red flags were reviewed with patient.

## 2011-02-15 NOTE — Assessment & Plan Note (Signed)
Patient has lost 12 pounds since last visit. She is excited about this. Encouraged in her efforts.

## 2011-02-17 LAB — URINE CULTURE

## 2011-02-19 ENCOUNTER — Ambulatory Visit (INDEPENDENT_AMBULATORY_CARE_PROVIDER_SITE_OTHER): Payer: BC Managed Care – PPO | Admitting: *Deleted

## 2011-02-19 VITALS — BP 122/80 | HR 104

## 2011-02-19 DIAGNOSIS — Z7901 Long term (current) use of anticoagulants: Secondary | ICD-10-CM

## 2011-02-19 DIAGNOSIS — I10 Essential (primary) hypertension: Secondary | ICD-10-CM

## 2011-02-19 DIAGNOSIS — I2699 Other pulmonary embolism without acute cor pulmonale: Secondary | ICD-10-CM

## 2011-02-19 LAB — POCT INR: INR: 2.5

## 2011-02-19 NOTE — Progress Notes (Signed)
Patient in for labs. Dr. Swaziland requested BP be checked. BP checked manually using large adult cuff. BP LA 122/80 and RA 130/80. Pulse 104. Will forward  To Dr. Swaziland

## 2011-02-26 ENCOUNTER — Ambulatory Visit (INDEPENDENT_AMBULATORY_CARE_PROVIDER_SITE_OTHER): Payer: BC Managed Care – PPO | Admitting: *Deleted

## 2011-02-26 DIAGNOSIS — I2699 Other pulmonary embolism without acute cor pulmonale: Secondary | ICD-10-CM

## 2011-02-26 DIAGNOSIS — Z7901 Long term (current) use of anticoagulants: Secondary | ICD-10-CM

## 2011-02-27 ENCOUNTER — Emergency Department (HOSPITAL_COMMUNITY)
Admission: EM | Admit: 2011-02-27 | Discharge: 2011-02-27 | Disposition: A | Discharge status: Home / Self Care | Payer: BC Managed Care – PPO | Attending: Emergency Medicine | Admitting: Emergency Medicine

## 2011-02-27 ENCOUNTER — Encounter (HOSPITAL_COMMUNITY): Payer: Self-pay | Admitting: *Deleted

## 2011-02-27 DIAGNOSIS — S61409A Unspecified open wound of unspecified hand, initial encounter: Secondary | ICD-10-CM | POA: Insufficient documentation

## 2011-02-27 DIAGNOSIS — IMO0002 Reserved for concepts with insufficient information to code with codable children: Secondary | ICD-10-CM

## 2011-02-27 DIAGNOSIS — Z86718 Personal history of other venous thrombosis and embolism: Secondary | ICD-10-CM | POA: Insufficient documentation

## 2011-02-27 DIAGNOSIS — Z7901 Long term (current) use of anticoagulants: Secondary | ICD-10-CM | POA: Insufficient documentation

## 2011-02-27 DIAGNOSIS — Z8542 Personal history of malignant neoplasm of other parts of uterus: Secondary | ICD-10-CM | POA: Insufficient documentation

## 2011-02-27 DIAGNOSIS — Z79899 Other long term (current) drug therapy: Secondary | ICD-10-CM | POA: Insufficient documentation

## 2011-02-27 DIAGNOSIS — W268XXA Contact with other sharp object(s), not elsewhere classified, initial encounter: Secondary | ICD-10-CM | POA: Insufficient documentation

## 2011-02-27 DIAGNOSIS — S61209A Unspecified open wound of unspecified finger without damage to nail, initial encounter: Secondary | ICD-10-CM | POA: Insufficient documentation

## 2011-02-27 HISTORY — DX: Malignant neoplasm of uterus, part unspecified: C55

## 2011-02-27 MED ORDER — TETANUS-DIPHTH-ACELL PERTUSSIS 5-2.5-18.5 LF-MCG/0.5 IM SUSP
0.5000 mL | Freq: Once | INTRAMUSCULAR | Status: AC
  Administered 2011-02-27: 0.5 mL via INTRAMUSCULAR
  Filled 2011-02-27: qty 0.5

## 2011-02-27 MED ORDER — TETANUS-DIPHTHERIA TOXOIDS TD 5-2 LFU IM INJ: 0.5000 mL | INJECTION | Freq: Once | INTRAMUSCULAR | Status: DC

## 2011-02-27 MED ORDER — CEPHALEXIN 500 MG PO CAPS: 500.0000 mg | ORAL_CAPSULE | Freq: Two times a day (BID) | ORAL | Status: AC

## 2011-02-27 NOTE — ED Notes (Signed)
Pt was cleaning and accidentally cut left hand b/w fingers and has small lacs and bleeding controlled.  Sent here for stitches.  Pt is on coumadin

## 2011-02-27 NOTE — ED Notes (Signed)
Pt presents to department for evaluation of laceration to L hand. Pt states she was cleaning glass globe with knife when she accidentally cut L first finger. (2) small lacerations noted to bottom of L first finger. Bleeding controlled. Pt concerned because she takes coumadin. Sensation intact. Able to wiggle digits. Tetanus unknown. No signs of distress at the time.

## 2011-02-27 NOTE — ED Provider Notes (Signed)
History     CSN: [id number] Arrival date & time: 02/27/2011 12:13 PM   First MD Initiated Contact with Patient 02/27/11 1635      Chief Complaint  Patient presents with  . Laceration    left side-superficial--on coumadin    (Consider location/radiation/quality/duration/timing/severity/associated sxs/prior treatment) HPI Comments: Patient reports she was decorating for Christmas and cut herself with a butter knife and broken glass, sustained 2 small lacerations on right hand and 3rd finger.  Bleeding now controlled, though patient is on coumadin with last INR over 3 and states the bleeding lasted for quite awhile.  States she has plan for coumadin dosage and recheck this week.  Denies any weakness, numbness in hand or finger, or difficulty with movement.    Patient is a 55 y.o. female presenting with skin laceration. The history is provided by the patient.  Laceration     Past Medical History  Diagnosis Date  . Pulmonary embolus   . Uterine cancer   . Thyroid disease     Past Surgical History  Procedure Date  . Abdominal hysterectomy   . Thyroid surgery     No family history on file.  History  Substance Use Topics  . Smoking status: Never Smoker   . Smokeless tobacco: Never Used  . Alcohol Use: No    OB History    Grav Para Term Preterm Abortions TAB SAB Ect Mult Living                  Review of Systems  All other systems reviewed and are negative.    Allergies  Simvastatin and Tramadol hcl  Home Medications   Current Outpatient Rx  Name Route Sig Dispense Refill  . ALBUTEROL 90 MCG/ACT IN AERS Inhalation Inhale 2 puffs into the lungs every 6 (six) hours as needed. 17 g 3  . ALPRAZOLAM 0.5 MG PO TABS Oral Take 1 tablet (0.5 mg total) by mouth 3 (three) times daily as needed for anxiety. 90 tablet 0  . CETIRIZINE HCL 10 MG PO TABS Oral Take 10 mg by mouth daily.      Marland Kitchen FLUTICASONE-SALMETEROL 250-50 MCG/DOSE IN AEPB Inhalation Inhale 1 puff into the  lungs 2 (two) times daily.      . GLYBURIDE 5 MG PO TABS Oral Take 1 tablet (5 mg total) by mouth 2 (two) times daily with a meal. 270 tablet 0  . LEVOTHYROXINE SODIUM 125 MCG PO TABS Oral Take 1 tablet (125 mcg total) by mouth daily. 90 tablet 0  . LISINOPRIL-HYDROCHLOROTHIAZIDE 20-12.5 MG PO TABS  TAKE ONE TABLET BY MOUTH EVERY DAY 30 tablet 3  . METFORMIN HCL 1000 MG PO TABS  TAKE ONE TABLET BY MOUTH TWICE DAILY 180 tablet 2  . PRAVASTATIN SODIUM 40 MG PO TABS  TAKE ONE TABLET BY MOUTH AT BEDTIME 30 tablet 3  . VERAPAMIL HCL ER 120 MG PO TBCR Oral Take 120 mg by mouth every morning.      . WARFARIN SODIUM 5 MG PO TABS Oral Take 5 mg by mouth daily. Takes 1 tab every day except on mon and thur takes 1 & 1/2 tabs     . FLUCONAZOLE 150 MG PO TABS Oral Take 1 tablet by mouth Daily.      BP 128/70  Pulse 116  Temp(Src) 99 F (37.2 C) (Oral)  Resp 20  SpO2 97%  Physical Exam  Constitutional: She is oriented to person, place, and time. She appears well-developed and well-nourished.  HENT:  Head: Normocephalic and atraumatic.  Neck: Neck supple.  Pulmonary/Chest: Effort normal.  Neurological: She is alert and oriented to person, place, and time.  Skin:       Right hand, palmar aspect with 2 small (1cm length) superficial lacerations at 3rd mcp and proximal phalanx of 3rd finger.  Hemostatic.  No FB seen or palpated.  Wound thoroughly soaked and cleansed.  Full AROM of fingers, strength 5/5, sensation intact, capillary refill < 2 seconds.      ED Course  Procedures (including critical care time)  5:35 PM As computer system is not functioning to allow orders at this time, verbal orders given for VS recheck and wound care with bacitracin and bandage.    Wound soaked in normal saline with small amount of hydrogen peroxide.  Wound thoroughly examined.  Wound is very superficial.  No evidence of FB.  Discussed slight possibility of retained FB with patient.  Given how small and superficial  the laceration is, I have decided not to order an xray at this time.  As patient is diabetic, will give her a few days of keflex to help prevent infection.  Discussed wound care with patient.  Nurse dressed wound.  Tetanus given.     Labs Reviewed - No data to display No results found.   1. Laceration       MDM  Patient with very small superficial laceration.  Thoroughly cleaned, explored, and dressed.  Tetanus given.  Patient has close follow up with PCP.          Dillard Cannon [place], [place] 02/27/11 [phone]

## 2011-02-27 NOTE — ED Provider Notes (Signed)
Medical screening examination/treatment/procedure(s) were performed by non-physician practitioner and as supervising physician I was immediately available for consultation/collaboration.  Cyndra Numbers, MD 02/27/11 2249

## 2011-03-02 ENCOUNTER — Ambulatory Visit (INDEPENDENT_AMBULATORY_CARE_PROVIDER_SITE_OTHER): Payer: BC Managed Care – PPO | Admitting: *Deleted

## 2011-03-02 DIAGNOSIS — Z7901 Long term (current) use of anticoagulants: Secondary | ICD-10-CM

## 2011-03-02 DIAGNOSIS — I2699 Other pulmonary embolism without acute cor pulmonale: Secondary | ICD-10-CM

## 2011-03-05 ENCOUNTER — Encounter: Payer: Self-pay | Admitting: *Deleted

## 2011-03-05 NOTE — Progress Notes (Signed)
BP improved.  NO change at this time.

## 2011-03-06 ENCOUNTER — Ambulatory Visit (INDEPENDENT_AMBULATORY_CARE_PROVIDER_SITE_OTHER): Payer: BC Managed Care – PPO | Admitting: *Deleted

## 2011-03-06 DIAGNOSIS — Z7901 Long term (current) use of anticoagulants: Secondary | ICD-10-CM

## 2011-03-06 DIAGNOSIS — I2699 Other pulmonary embolism without acute cor pulmonale: Secondary | ICD-10-CM

## 2011-03-07 ENCOUNTER — Ambulatory Visit: Payer: BC Managed Care – PPO | Attending: Gynecologic Oncology | Admitting: Gynecologic Oncology

## 2011-03-07 ENCOUNTER — Encounter: Payer: Self-pay | Admitting: Gynecologic Oncology

## 2011-03-07 VITALS — BP 116/64 | HR 88 | Temp 99.2°F | Resp 18 | Ht 65.67 in | Wt 288.9 lb

## 2011-03-07 DIAGNOSIS — Z86718 Personal history of other venous thrombosis and embolism: Secondary | ICD-10-CM | POA: Insufficient documentation

## 2011-03-07 DIAGNOSIS — E079 Disorder of thyroid, unspecified: Secondary | ICD-10-CM | POA: Insufficient documentation

## 2011-03-07 DIAGNOSIS — C549 Malignant neoplasm of corpus uteri, unspecified: Secondary | ICD-10-CM

## 2011-03-07 DIAGNOSIS — Z79899 Other long term (current) drug therapy: Secondary | ICD-10-CM | POA: Insufficient documentation

## 2011-03-07 NOTE — Progress Notes (Signed)
Consult Note: Gyn-Onc  Allison [name] 55 y.o. female  CC:  Chief Complaint  Patient presents with  . Follow-up    Endo ca    HPI: Allison [name] is a very pleasant 55 year old endometrial biopsy revealed endometrial carcinoma. April 2012 show a diagnostic laparoscopy exploratory laparotomy TAH/BSO. There was limited Trendelenburg capability secondary to Allison pulmonary issues. Final pathology revealed a grade 1 endometrial carcinoma with 1.5 cm out of 2.2 cm of thickness, so less than 50%. The tumor size was 7 cm. There was no lymphovascular space involvement. The adnexa were negative bilaterally. She comes in today for Allison first of November evaluation after surgery. She is overall doing quite well.   Review of systems: She denies any abdominal pain any change in Allison bowel or bladder habits. She has no nausea vomiting fevers chills chest pain shortness of breath. She occasionally has some tenderness at the level of the incision.  She has lost approximately 13 pounds which is been intentional. She is very pleased with how this is going and feels much better with the weight loss. Allison last Accu-Chek was 6.5. She has had a few urinary tract infections. When she's treated with antibiotics she gets a yeast infection.  Interval History: As above. Allison [name] is going to be having surgery later this month for a 13 cm pelvic mass. 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  Review of Systems: As above  Current Meds:  Outpatient Encounter Prescriptions as of 03/07/2011  Medication Sig Dispense Refill  . albuterol (PROVENTIL,VENTOLIN) 90 MCG/ACT inhaler Inhale 2 puffs into the lungs every 6 (six) hours as needed.  17 g  3  . ALPRAZolam (XANAX) 0.5 MG tablet Take 1 tablet (0.5 mg total) by mouth 3 (three) times daily as needed for anxiety.  90 tablet  0  . cetirizine (ZYRTEC) 10 MG tablet Take 10 mg by mouth daily.        . Fluticasone-Salmeterol (ADVAIR DISKUS) 250-50 MCG/DOSE AEPB Inhale 1 puff into the  lungs 2 (two) times daily.        Marland Kitchen glyBURIDE (DIABETA) 5 MG tablet Take 1 tablet (5 mg total) by mouth 2 (two) times daily with a meal.  270 tablet  0  . levothyroxine (SYNTHROID, LEVOTHROID) 125 MCG tablet Take 1 tablet (125 mcg total) by mouth daily.  90 tablet  0  . lisinopril-hydrochlorothiazide (PRINZIDE,ZESTORETIC) 20-12.5 MG per tablet TAKE ONE TABLET BY MOUTH EVERY DAY  30 tablet  3  . metFORMIN (GLUCOPHAGE) 1000 MG tablet TAKE ONE TABLET BY MOUTH TWICE DAILY  180 tablet  2  . pravastatin (PRAVACHOL) 40 MG tablet TAKE ONE TABLET BY MOUTH AT BEDTIME  30 tablet  3  . verapamil (CALAN-SR) 120 MG CR tablet Take 120 mg by mouth every morning.        . warfarin (COUMADIN) 5 MG tablet Take 5 mg by mouth daily. Takes 1 tab every day except on mon and thur takes 1 & 1/2 tabs       . fluconazole (DIFLUCAN) 150 MG tablet Take 1 tablet by mouth Daily.        Allergy:  Allergies  Allergen Reactions  . Oxycodone Nausea And Vomiting  . Simvastatin     REACTION: Severe leg pain  . Tramadol Hcl     REACTION: Felt sick    Social Hx:   History   Social History  . Marital Status: Married    Spouse Name: N/A    Number of Children: N/A  .  Years of Education: N/A   Occupational History  . Not on file.   Social History Main Topics  . Smoking status: Never Smoker   . Smokeless tobacco: Never Used  . Alcohol Use: No  . Drug Use: No  . Sexually Active: Not on file   Other Topics Concern  . Not on file   Social History Narrative   Patient cares for 2 grand children while Allison daughter is at work. This limits Allison ability to have appointments.    Past Surgical Hx:  Past Surgical History  Procedure Date  . Thyroid surgery   . Abdominal hysterectomy     TAHBSO    Past Medical Hx:  Past Medical History  Diagnosis Date  . Pulmonary embolus   . Uterine cancer   . Thyroid disease     Family Hx: No family history on file.  Vitals:  Blood pressure 116/64, pulse 88, temperature 99.2  F (37.3 C), temperature source Oral, resp. rate 18, height 5' 5.67" (1.668 m), weight 288 lb 14.4 oz (131.044 kg).  Physical Exam: Well-nourished well-developed female in no acute distress.  Neck: No lymphadenopathy no thyromegaly.  Lungs: Clear to auscultation.  Cardiovascular exam: Regular rate and rhythm.  Abdomen: Obese soft nontender nondistended there are no palpable masses or hepatosplenomegaly but exam is limited by habitus. Incison well healed.  Extremeties: 1-2+ nonpitting edema equal bilaterally.  Pelvic: External genitalia within normal limits. The vagina is well epithelialized. The vaginal cuff is visualized. There are no visible lesions. Bimanual examination is limited by habitus. However, there are no masses or nodularity. Rectovaginal exam confirms.   Assessment/Plan: 55 year old with stage IA grade 1 endometrial carcinoma diagnosed and treated in April of this year he clinically has no evidence of recurrent disease. She was congratulated on Allison weight loss efforts and access. We will set him up to 10 pounds by Allison next visit. We'll also perform a Pap smear at Allison next visit. She is up-to-date on all of Allison other surveillance followup including mammograms.  She has asked me to call Allison [name] which I will do regarding the Coumadin issue. After 2 and was wondering if for pulmonary embolism from July 2010 arose in the setting of endometrial carcinoma and as such if she is free of disease that the Coumadin be discontinued. It is difficult to ascertain whether or not she had Allison endometrial cancer at that time. I will be somewhat palpable she had an early endometrial lesion that was actually present 2 years prior to the time of diagnosis but that is entirely possible. The patient at this point Is very nervous about stopping Allison Coumadin and in many ways would like to continue it.   Allison [name] A., MD 03/07/2011, 9:02 AM

## 2011-03-12 NOTE — Progress Notes (Signed)
Note reviewed.  Will discuss further with pt. Appreciate the input and care from  Dr. Duard Brady.

## 2011-03-20 ENCOUNTER — Ambulatory Visit (INDEPENDENT_AMBULATORY_CARE_PROVIDER_SITE_OTHER): Payer: BC Managed Care – PPO | Admitting: *Deleted

## 2011-03-20 DIAGNOSIS — I2699 Other pulmonary embolism without acute cor pulmonale: Secondary | ICD-10-CM

## 2011-03-20 DIAGNOSIS — Z7901 Long term (current) use of anticoagulants: Secondary | ICD-10-CM

## 2011-03-20 LAB — POCT INR: INR: 3.5

## 2011-03-22 ENCOUNTER — Other Ambulatory Visit: Payer: Self-pay | Admitting: Family Medicine

## 2011-03-22 NOTE — Telephone Encounter (Signed)
Refill request

## 2011-03-30 ENCOUNTER — Ambulatory Visit (INDEPENDENT_AMBULATORY_CARE_PROVIDER_SITE_OTHER): Payer: BC Managed Care – PPO | Admitting: *Deleted

## 2011-03-30 DIAGNOSIS — Z7901 Long term (current) use of anticoagulants: Secondary | ICD-10-CM

## 2011-03-30 DIAGNOSIS — I2699 Other pulmonary embolism without acute cor pulmonale: Secondary | ICD-10-CM

## 2011-04-11 ENCOUNTER — Other Ambulatory Visit: Payer: Self-pay | Admitting: Family Medicine

## 2011-04-11 NOTE — Telephone Encounter (Signed)
Refill request

## 2011-04-13 ENCOUNTER — Ambulatory Visit (INDEPENDENT_AMBULATORY_CARE_PROVIDER_SITE_OTHER): Payer: BC Managed Care – PPO | Admitting: *Deleted

## 2011-04-13 DIAGNOSIS — I2699 Other pulmonary embolism without acute cor pulmonale: Secondary | ICD-10-CM

## 2011-04-13 DIAGNOSIS — Z7901 Long term (current) use of anticoagulants: Secondary | ICD-10-CM

## 2011-04-25 ENCOUNTER — Other Ambulatory Visit: Payer: Self-pay | Admitting: Family Medicine

## 2011-04-25 NOTE — Telephone Encounter (Signed)
Refill request

## 2011-04-26 ENCOUNTER — Other Ambulatory Visit: Payer: Self-pay | Admitting: Family Medicine

## 2011-04-26 DIAGNOSIS — E039 Hypothyroidism, unspecified: Secondary | ICD-10-CM

## 2011-04-26 NOTE — Telephone Encounter (Signed)
Will need TSH checked.  Put in a future order.  Could you let pt know that next time she comes in for an INR, we should check TSH, too?

## 2011-04-26 NOTE — Telephone Encounter (Signed)
Patient has an appt on 05/11/11 for next INR.  Will check TSH at that time.  Informed Neta Mends of future orders in Epic.   Gaylene Brooks, RN

## 2011-05-11 ENCOUNTER — Ambulatory Visit (INDEPENDENT_AMBULATORY_CARE_PROVIDER_SITE_OTHER): Payer: BC Managed Care – PPO | Admitting: *Deleted

## 2011-05-11 DIAGNOSIS — E039 Hypothyroidism, unspecified: Secondary | ICD-10-CM

## 2011-05-11 DIAGNOSIS — Z7901 Long term (current) use of anticoagulants: Secondary | ICD-10-CM

## 2011-05-11 DIAGNOSIS — I2699 Other pulmonary embolism without acute cor pulmonale: Secondary | ICD-10-CM

## 2011-05-11 LAB — POCT INR: INR: 2.9

## 2011-05-12 LAB — TSH: TSH: 0.536 u[IU]/mL (ref 0.350–4.500)

## 2011-05-16 ENCOUNTER — Encounter: Payer: Self-pay | Admitting: Family Medicine

## 2011-05-22 ENCOUNTER — Other Ambulatory Visit: Payer: Self-pay | Admitting: Family Medicine

## 2011-05-22 NOTE — Telephone Encounter (Signed)
Refill request

## 2011-05-23 ENCOUNTER — Encounter: Payer: Self-pay | Admitting: Family Medicine

## 2011-05-23 ENCOUNTER — Ambulatory Visit (INDEPENDENT_AMBULATORY_CARE_PROVIDER_SITE_OTHER): Payer: BC Managed Care – PPO | Admitting: Family Medicine

## 2011-05-23 DIAGNOSIS — J069 Acute upper respiratory infection, unspecified: Secondary | ICD-10-CM | POA: Insufficient documentation

## 2011-05-23 MED ORDER — HYDROCODONE-HOMATROPINE 5-1.5 MG/5ML PO SYRP: 5.0000 mL | ORAL_SOLUTION | Freq: Three times a day (TID) | ORAL | Status: DC | PRN

## 2011-05-23 MED ORDER — AZITHROMYCIN 500 MG PO TABS: 500.0000 mg | ORAL_TABLET | Freq: Every day | ORAL | Status: AC

## 2011-05-23 NOTE — Assessment & Plan Note (Signed)
Also with some lower tract disease, concerning for possible PNA in patient with prolonged cough, fever, some crackles on exam. Treat with 5 days Azithro 500 mg. Hycodan for cough suppressant.

## 2011-05-23 NOTE — Progress Notes (Signed)
  Subjective:    Patient ID: Allison [name], female    DOB: [birth year], 56 y.o.   MRN: [id number]  HPI  1.  Cough:  Present for about 12 days.  Initially with sore throat, developed productive cough 1 day later.  Has persisted, worsening at night such that she is waking at night with cough.  Fever to 102 for past several days, alternating with chills.  Now with sinus congestion and drainage for past 5-6 days.  Not much appetite. No nausea or vomiting.  Took Tylenol about 3 hours prior to visit today.    Review of Systems See HPI above for review of systems.       Objective:   Physical Exam BP 118/74  Pulse 76  Temp(Src) 98.9 F (37.2 C) (Oral)  Ht 5\' 6"  (1.676 m)  Wt 287 lb (130.182 kg)  BMI 46.32 kg/m2  SpO2 97% Gen:  Patient sitting on exam table, appears stated age in no acute distress, but does look like she feels ill. Head: Normocephalic atraumatic Eyes: EOMI, PERRL, sclera and conjunctiva non-erythematous Nose:  Nasal turbinates grossly enlarged bilaterally. Some exudates noted. Tender to palpation of maxillary sinus  Mouth: Mucosa membranes moist. Tonsils +2, nonenlarged, non-erythematous. Neck: No cervical lymphadenopathy noted Heart:  RRR, no murmurs auscultated. Pulm: Some mild wheezing BL bases.  Some crackles LLL Abd:  Soft/nondistended/nontender.  Good bowel sounds throughout all four quadrants.  No masses noted.  Ext:  No clubbing/cyanosis/erythema.  No edema noted bilateral lower extremities.             Assessment & Plan:

## 2011-05-23 NOTE — Patient Instructions (Signed)
Take the Azithromycin antibiotic once daily x 5 days. Use the cough syrup especially before sleep and up to every 6 hours if you need to.  Know that it can make you sleepy so don't drive with it.   If you're still not feeling better next week come back and see [place].

## 2011-05-24 ENCOUNTER — Telehealth: Payer: Self-pay | Admitting: Family Medicine

## 2011-05-24 MED ORDER — GUAIFENESIN-CODEINE 100-10 MG/5ML PO SYRP: 5.0000 mL | ORAL_SOLUTION | Freq: Every evening | ORAL | Status: AC | PRN

## 2011-05-24 NOTE — Telephone Encounter (Signed)
Filled Guaf-Codeine and placed in to be faxed box.

## 2011-05-24 NOTE — Telephone Encounter (Signed)
Took one dose yesterday afternoon and fel tnauseated afterwards. The  took again at 3:00 AM and had vomiting.  Has had no vomiting since then. Has been able to drink OK. Will forward to MD.

## 2011-05-24 NOTE — Telephone Encounter (Signed)
  HYDROcodone-homatropine (HYCODAN) 5-1.5 MG/5ML    Is making her throw up and needs something different [facility]

## 2011-05-24 NOTE — Telephone Encounter (Signed)
Would recommend she take Robitussin.  If this is not strong enough, we could consider Robitussin with codeine, but my concern is that the codeine might make her nauseous as well.

## 2011-05-24 NOTE — Telephone Encounter (Signed)
Patient states she had told MD that Robitussin is what she had been taking but it had not helped. States she would like to try  the med he suggested to call in .  Will forward back to MD..

## 2011-05-28 ENCOUNTER — Telehealth: Payer: Self-pay | Admitting: Family Medicine

## 2011-05-28 NOTE — Telephone Encounter (Signed)
Ms. Heckstall' brother reported that she was not well, so I called to check on her. She reports she finished the Zpack, but she is still coughing a lot.  She is taking Cherrytussin at night with a little bit of relief.  She has no fever.  She denies dyspnea.  Overall, she feels she is a little better.  She plans to come in a few weeks to have INR check.  If she worsens in the meantime, she will call us.

## 2011-06-08 ENCOUNTER — Encounter: Payer: Self-pay | Admitting: Family Medicine

## 2011-06-08 ENCOUNTER — Ambulatory Visit (INDEPENDENT_AMBULATORY_CARE_PROVIDER_SITE_OTHER): Payer: BC Managed Care – PPO | Admitting: *Deleted

## 2011-06-08 ENCOUNTER — Ambulatory Visit
Admission: RE | Admit: 2011-06-08 | Discharge: 2011-06-08 | Disposition: A | Discharge status: Home / Self Care | Payer: BC Managed Care – PPO | Source: Ambulatory Visit | Attending: Family Medicine | Admitting: Family Medicine

## 2011-06-08 ENCOUNTER — Ambulatory Visit (INDEPENDENT_AMBULATORY_CARE_PROVIDER_SITE_OTHER): Payer: BC Managed Care – PPO | Admitting: Family Medicine

## 2011-06-08 VITALS — BP 118/70 | HR 100 | Temp 98.7°F | Ht 66.0 in | Wt 295.7 lb

## 2011-06-08 DIAGNOSIS — R05 Cough: Secondary | ICD-10-CM

## 2011-06-08 DIAGNOSIS — K219 Gastro-esophageal reflux disease without esophagitis: Secondary | ICD-10-CM

## 2011-06-08 DIAGNOSIS — B373 Candidiasis of vulva and vagina: Secondary | ICD-10-CM

## 2011-06-08 DIAGNOSIS — J069 Acute upper respiratory infection, unspecified: Secondary | ICD-10-CM

## 2011-06-08 DIAGNOSIS — I10 Essential (primary) hypertension: Secondary | ICD-10-CM

## 2011-06-08 DIAGNOSIS — I2699 Other pulmonary embolism without acute cor pulmonale: Secondary | ICD-10-CM

## 2011-06-08 DIAGNOSIS — Z7901 Long term (current) use of anticoagulants: Secondary | ICD-10-CM

## 2011-06-08 LAB — POCT INR: INR: 4.9

## 2011-06-08 MED ORDER — FLUCONAZOLE 150 MG PO TABS: 150.0000 mg | ORAL_TABLET | Freq: Once | ORAL | Status: AC

## 2011-06-08 MED ORDER — FLUTICASONE PROPIONATE 50 MCG/ACT NA SUSP: 2.0000 | Freq: Every day | NASAL | Status: DC

## 2011-06-08 MED ORDER — OMEPRAZOLE 40 MG PO CPDR: 40.0000 mg | DELAYED_RELEASE_CAPSULE | Freq: Every day | ORAL | Status: DC

## 2011-06-08 NOTE — Assessment & Plan Note (Signed)
Pt reports symptoms s/p azithromycin treatment for CAP.  Will treat with diflucan 150 mg one time only.

## 2011-06-08 NOTE — Patient Instructions (Addendum)
Thank you for visiting [place] today! 1. Please use the Flonase nasal spray every morning (2 sprays in each nostril). 2. Go to [facility] to get your chest X-ray. 3. Start taking omeprazole 40 mg by mouth every day for reflux. 4. Do not take coumadin today or tomorrow.  Resume coumadin 5 mg every day beginning Sunday, March 10th.   5. Follow-up next Thursday in clinic.  We will recheck your INR. 6. Take diflucan 150 mg by mouth once for yeast infection.

## 2011-06-08 NOTE — Assessment & Plan Note (Signed)
BP 118/70 today.  Will continue lisinopril-HCTZ as ACEIs are renally protective.

## 2011-06-08 NOTE — Assessment & Plan Note (Addendum)
Pt reports persistent cough s/p pneumonia tt with antibiotics 3 weeks ago. Physical exam was benign today.  Ordered CXR to rule out any infiltrative process such as residual pneumonia or bronchiectasis.  Instructed patient to use Flonase to help with post-nasal drip.  Will start omeprazole 40 mg daily for reflux which could be contributing to cough.  Plan to re-evaluate symptoms next week.  Patient continues to have cough, worse at night.  Given negative lung exam and lack of symptoms that suggest lower respiratory tract infection (and lack of response to macrolide abx), will order CXR and consider other non-pulm causes.  The most likely culprits may be: GERD flare precipitating cough; post-nasal symptoms giving worsening nighttime cough; possibly related to her ACEI (although she has been on this med for many years without problems, much less likely consideration).  Finally, another PE is extremely unlikely given her supratherapeutic INR and lack of other symptoms that would suggest it. For close follow up if not improved with PPI, Flonase.

## 2011-06-08 NOTE — Progress Notes (Addendum)
  Subjective:    Patient ID: Allison [name], female    DOB: [birth year], 56 y.o.   MRN: [id number]  HPI Allison [name] is a 56 yo F with PMHx of htn and diabetes who presents to PCP for follow-up of pneumonia.  Pt finished antibiotic treatment on 2/25 and reports that her symptoms have not resolved.  She reports persistent cough and post-nasal drip for 3 weeks.  She reports that her cough is worse at night.  She takes hycodan once every evening which helps for a few hours then symptoms return.  She reports decreased energy, but denies sore throat, congestion, SOB, earache, HA, vision changes.  Denies fever, chills, n/v/d.  Pt reports that she feels better than she did when she had pneumonia, but still feels "bum." Patient seen and examined by me with medical student Gerlene Fee; I agree with the documentation above.  Patient reports that she no longer feels "ill', but that the persistent cough that is worse at night is the most bothersome symptom.  No fevers or chills.  Does report worsening of her GERD symptoms, which sometimes radiate to mid-sternum/back of throat. No hemoptysis.  Never been a smoker. Does note she has nasal drip, worse in fall with ragweed. 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  Has history uterine CA, going for one-year follow up in April.  She states that it is described to her that her doctors did not think the malignancy was the precipitating factor in her PE.    Review of Systems  All other systems reviewed and are negative.       Objective:   Physical Exam  Constitutional: She appears well-developed. No distress.  HENT:  Right Ear: Hearing and tympanic membrane normal.  Left Ear: Hearing and tympanic membrane normal.  Nose: Right sinus exhibits maxillary sinus tenderness. Right sinus exhibits no frontal sinus tenderness. Left sinus exhibits maxillary sinus tenderness. Left sinus exhibits no frontal sinus tenderness.    Mouth/Throat: Uvula is midline, oropharynx is clear and moist and mucous membranes are normal. No oropharyngeal exudate, posterior oropharyngeal edema or posterior oropharyngeal erythema.       Bilateral ear canal erythema  Eyes: Conjunctivae and EOM are normal. Pupils are equal, round, and reactive to light.  Neck: Normal range of motion. Neck supple. No thyromegaly present.  Cardiovascular: Regular rhythm, normal heart sounds and intact distal pulses.  Tachycardia present.  Exam reveals no gallop and no friction rub.   No murmur heard. Pulmonary/Chest: Effort normal. No stridor. No respiratory distress. She has no wheezes. She has no rales. She exhibits no tenderness.  Abdominal: Soft. There is no hepatomegaly. There is no tenderness. There is no rebound. A hernia is present. Hernia confirmed positive in the ventral area.  Lymphadenopathy:    She has no cervical adenopathy.  Skin: She is not diaphoretic.   Blood pressure 118/70, pulse 100, temperature 98.7 F (37.1 C), temperature source Oral, height 5\' 6"  (1.676 m), weight 295 lb 11.2 oz (134.129 kg), SpO2 97.00%.  Well appearing, no apparent distress HEENT Neck supple, no cervical adenopathy.  TMs clear. trace maxillary/frontal sinus tenderness. No purulent nasal discharge in nasal vault. Clear orpharynx.  PULM Clear bilaterally, no rales or wheezes CCor: S1S2 no extra sounds ABD Obese, soft, nontender.       Assessment & Plan:

## 2011-06-14 ENCOUNTER — Ambulatory Visit (INDEPENDENT_AMBULATORY_CARE_PROVIDER_SITE_OTHER): Payer: BC Managed Care – PPO | Admitting: *Deleted

## 2011-06-14 DIAGNOSIS — Z7901 Long term (current) use of anticoagulants: Secondary | ICD-10-CM

## 2011-06-14 DIAGNOSIS — I2699 Other pulmonary embolism without acute cor pulmonale: Secondary | ICD-10-CM

## 2011-06-17 ENCOUNTER — Other Ambulatory Visit: Payer: Self-pay | Admitting: Family Medicine

## 2011-06-17 DIAGNOSIS — E785 Hyperlipidemia, unspecified: Secondary | ICD-10-CM

## 2011-06-18 ENCOUNTER — Ambulatory Visit (INDEPENDENT_AMBULATORY_CARE_PROVIDER_SITE_OTHER): Payer: BC Managed Care – PPO | Admitting: *Deleted

## 2011-06-18 ENCOUNTER — Ambulatory Visit (INDEPENDENT_AMBULATORY_CARE_PROVIDER_SITE_OTHER): Payer: BC Managed Care – PPO | Admitting: Family Medicine

## 2011-06-18 ENCOUNTER — Encounter: Payer: Self-pay | Admitting: Family Medicine

## 2011-06-18 VITALS — BP 172/74 | HR 99 | Temp 99.3°F | Ht 66.0 in | Wt 291.1 lb

## 2011-06-18 DIAGNOSIS — E039 Hypothyroidism, unspecified: Secondary | ICD-10-CM

## 2011-06-18 DIAGNOSIS — I2699 Other pulmonary embolism without acute cor pulmonale: Secondary | ICD-10-CM

## 2011-06-18 DIAGNOSIS — Z7901 Long term (current) use of anticoagulants: Secondary | ICD-10-CM

## 2011-06-18 MED ORDER — FLUCONAZOLE 150 MG PO TABS: 150.0000 mg | ORAL_TABLET | Freq: Once | ORAL | Status: AC

## 2011-06-18 MED ORDER — AMOXICILLIN-POT CLAVULANATE 875-125 MG PO TABS: 1.0000 | ORAL_TABLET | Freq: Two times a day (BID) | ORAL | Status: DC

## 2011-06-18 MED ORDER — FLUCONAZOLE 150 MG PO TABS: 150.0000 mg | ORAL_TABLET | Freq: Once | ORAL | Status: DC

## 2011-06-18 NOTE — Patient Instructions (Signed)
Continue on your warfarin at one a day. Please start the antibiotic for the sinus infection.  Come back on Thursday or Friday for an INR check. After you finish the antibiotic, if you need to start the yeast infection medicine, that will be fine.  Continue to take one a day of your warfarin, unless we tell you something different at your INR check. Let us know what meter your insurance will cover.  I will send a prescription in for that.   We will check your cholesterol, hemoglobin A1C, and electrolytes when you come back for your INR check.

## 2011-06-18 NOTE — Progress Notes (Signed)
Addended by: [place], Timber Lucarelli T on: 06/18/2011 11:22 AM   Modules accepted: Orders

## 2011-06-18 NOTE — Progress Notes (Signed)
  Subjective:    Patient ID: Allison [name], female    DOB: [birth year], 56 y.o.   MRN: [id number]  HPI  Opened in error.  Review of Systems     Objective:   Physical Exam        Assessment & Plan:

## 2011-06-21 ENCOUNTER — Other Ambulatory Visit: Payer: Self-pay | Admitting: Family Medicine

## 2011-06-22 ENCOUNTER — Ambulatory Visit (INDEPENDENT_AMBULATORY_CARE_PROVIDER_SITE_OTHER): Payer: BC Managed Care – PPO | Admitting: *Deleted

## 2011-06-22 DIAGNOSIS — Z7901 Long term (current) use of anticoagulants: Secondary | ICD-10-CM

## 2011-06-22 DIAGNOSIS — E785 Hyperlipidemia, unspecified: Secondary | ICD-10-CM

## 2011-06-22 DIAGNOSIS — E119 Type 2 diabetes mellitus without complications: Secondary | ICD-10-CM

## 2011-06-22 DIAGNOSIS — I2699 Other pulmonary embolism without acute cor pulmonale: Secondary | ICD-10-CM

## 2011-06-22 LAB — COMPLETE METABOLIC PANEL WITH GFR
AST: 20 U/L (ref 0–37)
Albumin: 4.1 g/dL (ref 3.5–5.2)
Alkaline Phosphatase: 45 U/L (ref 39–117)
BUN: 16 mg/dL (ref 6–23)
Calcium: 7.1 mg/dL — ABNORMAL LOW (ref 8.4–10.5)
Creat: 1.2 mg/dL — ABNORMAL HIGH (ref 0.50–1.10)
GFR, Est Non African American: 51 mL/min — ABNORMAL LOW
Glucose, Bld: 117 mg/dL — ABNORMAL HIGH (ref 70–99)
Potassium: 4.4 mEq/L (ref 3.5–5.3)

## 2011-06-22 LAB — LIPID PANEL
Cholesterol: 151 mg/dL (ref 0–200)
Total CHOL/HDL Ratio: 3.4 Ratio

## 2011-06-22 LAB — POCT GLYCOSYLATED HEMOGLOBIN (HGB A1C): Hemoglobin A1C: 7.5

## 2011-06-22 LAB — POCT INR: INR: 3.5

## 2011-06-25 ENCOUNTER — Encounter: Payer: Self-pay | Admitting: Family Medicine

## 2011-06-25 ENCOUNTER — Ambulatory Visit (INDEPENDENT_AMBULATORY_CARE_PROVIDER_SITE_OTHER): Payer: BC Managed Care – PPO | Admitting: Family Medicine

## 2011-06-25 VITALS — BP 124/61 | HR 123 | Temp 99.2°F | Ht 66.0 in | Wt 294.0 lb

## 2011-06-25 DIAGNOSIS — R05 Cough: Secondary | ICD-10-CM | POA: Insufficient documentation

## 2011-06-25 LAB — CBC WITH DIFFERENTIAL/PLATELET
Basophils Absolute: 0 K/uL (ref 0.0–0.1)
Basophils Relative: 0 % (ref 0–1)
Eosinophils Absolute: 0.1 K/uL (ref 0.0–0.7)
Eosinophils Relative: 2 % (ref 0–5)
HCT: 40.2 % (ref 36.0–46.0)
Hemoglobin: 12.7 g/dL (ref 12.0–15.0)
Lymphocytes Relative: 24 % (ref 12–46)
Lymphs Abs: 1.7 K/uL (ref 0.7–4.0)
MCH: 28.9 pg (ref 26.0–34.0)
MCHC: 31.6 g/dL (ref 30.0–36.0)
MCV: 91.4 fL (ref 78.0–100.0)
Monocytes Absolute: 0.3 K/uL (ref 0.1–1.0)
Monocytes Relative: 5 % (ref 3–12)
Neutro Abs: 4.8 K/uL (ref 1.7–7.7)
Neutrophils Relative %: 70 % (ref 43–77)
Platelets: 234 K/uL (ref 150–400)
RBC: 4.4 MIL/uL (ref 3.87–5.11)
RDW: 14.5 % (ref 11.5–15.5)
WBC: 6.9 K/uL (ref 4.0–10.5)

## 2011-06-25 MED ORDER — ONDANSETRON 4 MG PO TBDP: 4.0000 mg | ORAL_TABLET | Freq: Three times a day (TID) | ORAL | Status: AC | PRN

## 2011-06-25 MED ORDER — GUAIFENESIN-CODEINE 100-10 MG/5ML PO SOLN: 5.0000 mL | Freq: Three times a day (TID) | ORAL | Status: AC | PRN

## 2011-06-25 MED ORDER — BENZONATATE 100 MG PO CAPS: 100.0000 mg | ORAL_CAPSULE | Freq: Two times a day (BID) | ORAL | Status: AC | PRN

## 2011-06-25 NOTE — Patient Instructions (Signed)

## 2011-06-25 NOTE — Progress Notes (Signed)
  Subjective:    Patient ID: Allison [name], female    DOB: 01/09/1956, 56 y.o.   MRN: [id number]  HPI 1. Cough Patient has been sick with a cough since 05/23/11.  Originally: Per Dr. Gwendolyn [name]: "Cough: Present for about 12 days. Initially with sore throat, developed productive cough 1 day later. Has persisted, worsening at night such that she is waking at night with cough. Fever to 102 for past several days, alternating with chills. Now with sinus congestion and drainage for past 5-6 days. Not much appetite. No nausea or vomiting. Took Tylenol about 3 hours prior to visit today. "  She was treated with cough suppressants and azithromycin for 5 days. This was for a pneumonia.   She was seen again by Dr. Mauricio [name] with continued cough. Chest Xray 3/8 was unremarkable.  " Patient continues to have cough, worse at night. Given negative lung exam and lack of symptoms that suggest lower respiratory tract infection (and lack of response to macrolide abx), will order CXR and consider other non-pulm causes. The most likely culprits may be: GERD flare precipitating cough; post-nasal symptoms giving worsening nighttime cough; possibly related to her ACEI (although she has been on this med for many years without problems, much less likely consideration). Finally, another PE is extremely unlikely given her supratherapeutic INR and lack of other symptoms that would suggest it. For close follow up if not improved with PPI, Flonase."  Pt. Then saw Dr. [place]: She was treated with Augmentin "Please start the antibiotic for the sinus infection. Come back on Thursday or Friday for an INR check."  Today: she returns saying she is tired of being sick. She has continued cough without production. No gross SOB or dyspnea. She reports two episodes of fever 101 sublingual and chills for the last couple of days. She has no chest pain.   Tobacco use: Patient is a non-smoker.  Review of Systems Pertinent items are noted in  HPI.     Objective:   Physical Exam Filed Vitals:   06/25/11 0935  BP: 124/61  Pulse: 123  Temp: 99.2 F (37.3 C)  TempSrc: Oral  Height: 5\' 6"  (1.676 m)  Weight: 294 lb (133.358 kg)  Lungs:  Normal respiratory effort, chest expands symmetrically. Lungs are clear to auscultation, no crackles or wheezes. Heart - Regular rate and rhythm.  No murmurs, gallops or rubs.    Extremities:   Non-tender, No cyanosis, edema, or deformity noted. Skin:  Intact without suspicious lesions or rashes Nose:  External nasal examination shows no deformity or inflammation. Nasal mucosa are pink and moist without lesions or exudates. No septal dislocation or dislocation.No obstruction to airflow. Throat: normal mucosa, no exudate, uvula midline, no redness    Assessment & Plan:

## 2011-06-25 NOTE — Assessment & Plan Note (Signed)
I believe this patient has a prolonged cough from either post-nasal drip or continued irritation as in post-viral cough syndrome.  I am slightly worried because of her recent temperatures, but her lung exam was very clear. Getting another chest xray does not seem necessary at this time.   I will obtain a CBC with diff to see if she has an immune response/acute phase reaction.   I will give her robitussin AC with zofran(she gets nauseated) and tessalon perles. She is to follow up if not better. I advised her that cough is sometimes a long drawn out process.   We may need to test her for pertussis - although azithromycin should have cleared it.

## 2011-07-02 ENCOUNTER — Other Ambulatory Visit: Payer: Self-pay | Admitting: Family Medicine

## 2011-07-02 ENCOUNTER — Encounter: Payer: Self-pay | Admitting: Family Medicine

## 2011-07-02 ENCOUNTER — Ambulatory Visit (INDEPENDENT_AMBULATORY_CARE_PROVIDER_SITE_OTHER): Payer: BC Managed Care – PPO | Admitting: *Deleted

## 2011-07-02 DIAGNOSIS — Z7901 Long term (current) use of anticoagulants: Secondary | ICD-10-CM

## 2011-07-02 DIAGNOSIS — I2699 Other pulmonary embolism without acute cor pulmonale: Secondary | ICD-10-CM

## 2011-07-02 MED ORDER — ALPRAZOLAM 0.5 MG PO TABS: 0.5000 mg | ORAL_TABLET | Freq: Three times a day (TID) | ORAL | Status: DC | PRN

## 2011-07-02 NOTE — Progress Notes (Signed)
Rx for alprazolam called in to [facility] pharmacy in [place]

## 2011-07-16 ENCOUNTER — Ambulatory Visit (INDEPENDENT_AMBULATORY_CARE_PROVIDER_SITE_OTHER): Payer: BC Managed Care – PPO | Admitting: *Deleted

## 2011-07-16 DIAGNOSIS — I2699 Other pulmonary embolism without acute cor pulmonale: Secondary | ICD-10-CM

## 2011-07-16 DIAGNOSIS — Z7901 Long term (current) use of anticoagulants: Secondary | ICD-10-CM

## 2011-07-16 LAB — POCT INR: INR: 2.9

## 2011-07-17 LAB — PTH, INTACT AND CALCIUM: Calcium, Total (PTH): 8.3 mg/dL — ABNORMAL LOW (ref 8.4–10.5)

## 2011-07-17 LAB — VITAMIN D 25 HYDROXY (VIT D DEFICIENCY, FRACTURES): Vit D, 25-Hydroxy: 14 ng/mL — ABNORMAL LOW (ref 30–89)

## 2011-07-20 ENCOUNTER — Other Ambulatory Visit: Payer: Self-pay | Admitting: Family Medicine

## 2011-07-23 ENCOUNTER — Telehealth: Payer: Self-pay | Admitting: Family Medicine

## 2011-07-23 MED ORDER — ERGOCALCIFEROL 1.25 MG (50000 UT) PO CAPS: 50000.0000 [IU] | ORAL_CAPSULE | ORAL | Status: DC

## 2011-07-23 NOTE — Telephone Encounter (Signed)
Discussed with pt on phone.

## 2011-07-24 ENCOUNTER — Other Ambulatory Visit: Payer: Self-pay | Admitting: Family Medicine

## 2011-07-30 ENCOUNTER — Ambulatory Visit (INDEPENDENT_AMBULATORY_CARE_PROVIDER_SITE_OTHER): Payer: BC Managed Care – PPO | Admitting: *Deleted

## 2011-07-30 DIAGNOSIS — Z7901 Long term (current) use of anticoagulants: Secondary | ICD-10-CM

## 2011-07-30 DIAGNOSIS — I2699 Other pulmonary embolism without acute cor pulmonale: Secondary | ICD-10-CM

## 2011-08-06 ENCOUNTER — Ambulatory Visit (INDEPENDENT_AMBULATORY_CARE_PROVIDER_SITE_OTHER): Payer: BC Managed Care – PPO | Admitting: *Deleted

## 2011-08-06 DIAGNOSIS — Z7901 Long term (current) use of anticoagulants: Secondary | ICD-10-CM

## 2011-08-06 DIAGNOSIS — I2699 Other pulmonary embolism without acute cor pulmonale: Secondary | ICD-10-CM

## 2011-08-20 ENCOUNTER — Telehealth: Payer: Self-pay | Admitting: Family Medicine

## 2011-08-20 ENCOUNTER — Ambulatory Visit (INDEPENDENT_AMBULATORY_CARE_PROVIDER_SITE_OTHER): Payer: BC Managed Care – PPO | Admitting: *Deleted

## 2011-08-20 DIAGNOSIS — I2699 Other pulmonary embolism without acute cor pulmonale: Secondary | ICD-10-CM

## 2011-08-20 DIAGNOSIS — N390 Urinary tract infection, site not specified: Secondary | ICD-10-CM

## 2011-08-20 DIAGNOSIS — Z7901 Long term (current) use of anticoagulants: Secondary | ICD-10-CM

## 2011-08-20 LAB — POCT URINALYSIS DIPSTICK
Bilirubin, UA: NEGATIVE
Glucose, UA: NEGATIVE
Nitrite, UA: NEGATIVE
Urobilinogen, UA: 0.2

## 2011-08-20 LAB — POCT INR: INR: 2.4

## 2011-08-20 LAB — POCT UA - MICROSCOPIC ONLY: WBC, Ur, HPF, POC: >20

## 2011-08-20 MED ORDER — CEPHALEXIN 500 MG PO TABS: 500.0000 mg | ORAL_TABLET | Freq: Two times a day (BID) | ORAL | Status: AC

## 2011-08-20 NOTE — Progress Notes (Signed)
See phone note from today for details.

## 2011-08-20 NOTE — Progress Notes (Signed)
Urine for analysis and culture:  Per Dr. Swaziland

## 2011-08-20 NOTE — Telephone Encounter (Signed)
Pt here for INR check, but reports UTI sx.  She has had low abdominal pain, pressure when urinating.  Feels like previous UTIs.  Mild back pain.  No fevers or chills.  No CVAT. UA suggestive of UTI.  Will do a course of Keflex, and will likely need course of fluconazole as pt is prone to yeast infections.  This will require an adjustment in her warfarin.  Reviewed plan with Bonnie Swaziland and patient.  Will send rx for keflex to pharmacy. Pt agrees with plan

## 2011-08-29 ENCOUNTER — Ambulatory Visit (INDEPENDENT_AMBULATORY_CARE_PROVIDER_SITE_OTHER): Payer: BC Managed Care – PPO | Admitting: *Deleted

## 2011-08-29 DIAGNOSIS — Z7901 Long term (current) use of anticoagulants: Secondary | ICD-10-CM

## 2011-08-29 DIAGNOSIS — I2699 Other pulmonary embolism without acute cor pulmonale: Secondary | ICD-10-CM

## 2011-08-29 LAB — POCT INR: INR: 1.7

## 2011-09-03 ENCOUNTER — Ambulatory Visit (INDEPENDENT_AMBULATORY_CARE_PROVIDER_SITE_OTHER): Payer: BC Managed Care – PPO | Admitting: *Deleted

## 2011-09-03 ENCOUNTER — Encounter: Payer: Self-pay | Admitting: Family Medicine

## 2011-09-03 ENCOUNTER — Ambulatory Visit (INDEPENDENT_AMBULATORY_CARE_PROVIDER_SITE_OTHER): Payer: BC Managed Care – PPO | Admitting: Family Medicine

## 2011-09-03 VITALS — BP 129/78 | HR 99 | Temp 99.0°F | Ht 66.0 in | Wt 295.0 lb

## 2011-09-03 DIAGNOSIS — Z7901 Long term (current) use of anticoagulants: Secondary | ICD-10-CM

## 2011-09-03 DIAGNOSIS — E1165 Type 2 diabetes mellitus with hyperglycemia: Secondary | ICD-10-CM

## 2011-09-03 DIAGNOSIS — B373 Candidiasis of vulva and vagina: Secondary | ICD-10-CM

## 2011-09-03 DIAGNOSIS — E785 Hyperlipidemia, unspecified: Secondary | ICD-10-CM

## 2011-09-03 DIAGNOSIS — M545 Low back pain: Secondary | ICD-10-CM

## 2011-09-03 DIAGNOSIS — E118 Type 2 diabetes mellitus with unspecified complications: Secondary | ICD-10-CM

## 2011-09-03 DIAGNOSIS — Z299 Encounter for prophylactic measures, unspecified: Secondary | ICD-10-CM

## 2011-09-03 DIAGNOSIS — R300 Dysuria: Secondary | ICD-10-CM

## 2011-09-03 DIAGNOSIS — Z86711 Personal history of pulmonary embolism: Secondary | ICD-10-CM

## 2011-09-03 DIAGNOSIS — Z1211 Encounter for screening for malignant neoplasm of colon: Secondary | ICD-10-CM

## 2011-09-03 DIAGNOSIS — I2699 Other pulmonary embolism without acute cor pulmonale: Secondary | ICD-10-CM

## 2011-09-03 DIAGNOSIS — E039 Hypothyroidism, unspecified: Secondary | ICD-10-CM

## 2011-09-03 LAB — POCT UA - MICROSCOPIC ONLY

## 2011-09-03 LAB — POCT URINALYSIS DIPSTICK
Glucose, UA: NEGATIVE
Nitrite, UA: NEGATIVE
Urobilinogen, UA: 0.2

## 2011-09-03 MED ORDER — FLUCONAZOLE 150 MG PO TABS: 150.0000 mg | ORAL_TABLET | Freq: Once | ORAL | Status: AC

## 2011-09-03 NOTE — Patient Instructions (Signed)
It was great to see you. We can check the diabetes test in a few weeks to see how we are doing. I have called in the fluconazole - I will call you this afternoon to talk about your warfarin dosing once we have today's result. Please do the stool cards when you can. Please schedule a mammogram when you can. Let us know if your abdominal pain is lasting for another week after you start drinking more water.

## 2011-09-03 NOTE — Progress Notes (Signed)
  Subjective:    Patient ID: Allison [name], female    DOB: 11-29-1955, 56 y.o.   MRN: [id number]  HPI 56 yo female s/p hysterectomy for endometrial ca here for follow up. She has persistent low abdominal pain with urination.  Pain radiates around to B flanks.  No hematuria.  No burning with urination.  No back injury.  Took a course of cephalexin for presumed UTI, though culture was negative.  Denies N/V/D/C, does feel bloated.  + vaginal itching, similar to previous yeast infections, which she frequently gets when she takes fluconazole.   Goes in 2 days for visit with gyn onc and will have pap then at cancer center. Not checking glucose at home (no meter).  Feels that diabetes is under control.  Prevention: Has an ophthalmologist that she can see in [place].  Due for mammogram.  Declines colonoscopy, but would be willing to do hemoccult cards.     Review of Systems See ROS     Objective:   Physical Exam  Nursing note and vitals reviewed. Constitutional:       Obese.  No distress.  Abdominal:       Exam limited by body habitus.    Musculoskeletal:       Diabetic foot exam- B feet with mildly thickened skin between toes.  No other lesions.  DP 2+ B.  Neurological testing intact to microfilament.          Assessment & Plan:

## 2011-09-04 ENCOUNTER — Encounter: Payer: Self-pay | Admitting: Family Medicine

## 2011-09-04 DIAGNOSIS — Z299 Encounter for prophylactic measures, unspecified: Secondary | ICD-10-CM | POA: Insufficient documentation

## 2011-09-04 NOTE — Assessment & Plan Note (Addendum)
Due for Hgb A1C after 09/22/11.  Could use a meter, but having trouble affording.  Taking meds as prescribed.  If HGBA1C remains elevated, diabetes ed would be a good next step for Ms. Allison [name].

## 2011-09-04 NOTE — Assessment & Plan Note (Signed)
Culture negative, repeat UA today negative.  Pt to have gyn exam done in 2 days, so deferred today.  Consider possible interstitial cystitis.  Pt to increase water.  She does not drink caffeinated or sugar beverages.  Follow up after she tries increasing water if still symptomatic.

## 2011-09-04 NOTE — Assessment & Plan Note (Signed)
Will treat with course of fluconazole.  Warfarin adjusted.

## 2011-09-04 NOTE — Assessment & Plan Note (Signed)
Continue with current management.  Most recent lipid panel at goal except for TG of 171.

## 2011-09-04 NOTE — Assessment & Plan Note (Signed)
Continue current management

## 2011-09-05 ENCOUNTER — Ambulatory Visit: Payer: BC Managed Care – PPO | Admitting: Lab

## 2011-09-05 ENCOUNTER — Ambulatory Visit: Payer: BC Managed Care – PPO | Attending: Gynecologic Oncology | Admitting: Gynecologic Oncology

## 2011-09-05 ENCOUNTER — Other Ambulatory Visit (HOSPITAL_COMMUNITY)
Admission: RE | Admit: 2011-09-05 | Discharge: 2011-09-05 | Disposition: A | Discharge status: Home / Self Care | Payer: BC Managed Care – PPO | Source: Ambulatory Visit | Attending: Gynecologic Oncology | Admitting: Gynecologic Oncology

## 2011-09-05 ENCOUNTER — Encounter: Payer: Self-pay | Admitting: Gynecologic Oncology

## 2011-09-05 VITALS — BP 128/72 | HR 64 | Temp 98.8°F | Resp 14 | Ht 66.0 in | Wt 292.7 lb

## 2011-09-05 DIAGNOSIS — I10 Essential (primary) hypertension: Secondary | ICD-10-CM | POA: Insufficient documentation

## 2011-09-05 DIAGNOSIS — E119 Type 2 diabetes mellitus without complications: Secondary | ICD-10-CM | POA: Insufficient documentation

## 2011-09-05 DIAGNOSIS — C55 Malignant neoplasm of uterus, part unspecified: Secondary | ICD-10-CM

## 2011-09-05 DIAGNOSIS — Z01419 Encounter for gynecological examination (general) (routine) without abnormal findings: Secondary | ICD-10-CM | POA: Insufficient documentation

## 2011-09-05 DIAGNOSIS — Z8542 Personal history of malignant neoplasm of other parts of uterus: Secondary | ICD-10-CM | POA: Insufficient documentation

## 2011-09-05 DIAGNOSIS — E785 Hyperlipidemia, unspecified: Secondary | ICD-10-CM | POA: Insufficient documentation

## 2011-09-05 DIAGNOSIS — Z7901 Long term (current) use of anticoagulants: Secondary | ICD-10-CM | POA: Insufficient documentation

## 2011-09-05 DIAGNOSIS — R05 Cough: Secondary | ICD-10-CM | POA: Insufficient documentation

## 2011-09-05 DIAGNOSIS — R059 Cough, unspecified: Secondary | ICD-10-CM | POA: Insufficient documentation

## 2011-09-05 DIAGNOSIS — Z87440 Personal history of urinary (tract) infections: Secondary | ICD-10-CM | POA: Insufficient documentation

## 2011-09-05 DIAGNOSIS — Z888 Allergy status to other drugs, medicaments and biological substances status: Secondary | ICD-10-CM | POA: Insufficient documentation

## 2011-09-05 DIAGNOSIS — Z90710 Acquired absence of both cervix and uterus: Secondary | ICD-10-CM | POA: Insufficient documentation

## 2011-09-05 LAB — BASIC METABOLIC PANEL
BUN: 18 mg/dL (ref 6–23)
Calcium: 7.8 mg/dL — ABNORMAL LOW (ref 8.4–10.5)
Glucose, Bld: 121 mg/dL — ABNORMAL HIGH (ref 70–99)
Potassium: 4.1 mEq/L (ref 3.5–5.3)

## 2011-09-05 NOTE — Progress Notes (Signed)
Consult Note: Gyn-Onc  Allison [name] 56 y.o. female  CC:  Chief Complaint  Patient presents with  . Endo ca    Follow up  . Abdominal Pain    HPI: Allison [name] is a very pleasant 56 year old endometrial biopsy revealed endometrial carcinoma. April 2012 show a diagnostic laparoscopy exploratory laparotomy TAH/BSO. There was limited Trendelenburg capability secondary to her pulmonary issues. Final pathology revealed a grade 1 endometrial carcinoma with 1.5 cm out of 2.2 cm of thickness, so less than 50%. The tumor size was 7 cm. There was no lymphovascular space involvement. The adnexa were negative bilaterally. She is overall doing quite well.   Interval History:   She denies any change in her bowel or bladder habits. She has no nausea vomiting fevers chills chest pain shortness of breath. Her last Accu-Chek was 6.5. She has had a few urinary tract infections. She had bronchitis for 3 months. She continues on her Coumadin is on vitamin ID at this time. Her granddaughter had benign disease from her surgery with Dr. Stefano Gaul.  Review of Systems: She denies any chest pain shortness of breath nausea vomiting fevers or chills. She does complain of abdominal pain that she's had for the past few weeks. It is on the front of the abdomen on the side that radiates to her back. The pain like a menstrual cycle. She was seen by her primary care physician Dr. Swaziland the urinalysis was negative. She'll be checking liver function tests. She did have pain to deep palpation when she saw her primary physician and she did discuss getting a CT scan. She has had a cough 3 months on that she has had bronchitis. She has not had her mammogram. Review of systems is otherwise negative.  Current Meds:  Outpatient Encounter Prescriptions as of 09/05/2011  Medication Sig Dispense Refill  . albuterol (PROVENTIL,VENTOLIN) 90 MCG/ACT inhaler Inhale 2 puffs into the lungs every 6 (six) hours as needed.  17 g  3  .  ALPRAZolam (XANAX) 0.5 MG tablet Take 1 tablet (0.5 mg total) by mouth 3 (three) times daily as needed for anxiety.  90 tablet  0  . cetirizine (ZYRTEC) 10 MG tablet Take 10 mg by mouth daily.        . ergocalciferol (VITAMIN D2) 50000 UNITS capsule Take 1 capsule (50,000 Units total) by mouth once a week.  8 capsule  0  . fluticasone (FLONASE) 50 MCG/ACT nasal spray Place 2 sprays into the nose daily.  16 g  6  . Fluticasone-Salmeterol (ADVAIR DISKUS) 250-50 MCG/DOSE AEPB Inhale 1 puff into the lungs 2 (two) times daily.        Marland Kitchen glyBURIDE (DIABETA) 5 MG tablet Take 1 tablet (5 mg total) by mouth 2 (two) times daily with a meal.  270 tablet  0  . levothyroxine (SYNTHROID, LEVOTHROID) 125 MCG tablet TAKE ONE TABLET BY MOUTH EVERY DAY  90 tablet  0  . lisinopril-hydrochlorothiazide (PRINZIDE,ZESTORETIC) 20-12.5 MG per tablet TAKE ONE TABLET BY MOUTH EVERY DAY  90 tablet  0  . metFORMIN (GLUCOPHAGE) 1000 MG tablet TAKE ONE TABLET BY MOUTH TWICE DAILY  180 tablet  3  . omeprazole (PRILOSEC) 40 MG capsule       . pravastatin (PRAVACHOL) 40 MG tablet TAKE ONE TABLET BY MOUTH EVERY DAY AT BEDTIME  90 tablet  0  . verapamil (CALAN-SR) 120 MG CR tablet Take 120 mg by mouth every morning.        . warfarin (COUMADIN) 5  MG tablet Take 5 mg by mouth daily. Takes 1 tab every day except on mon and thur takes 1 & 1/2 tabs       . fluconazole (DIFLUCAN) 150 MG tablet Take 1 tablet (150 mg total) by mouth once. May repeat in 72 hours if needed.  2 tablet  0    Allergy:  Allergies  Allergen Reactions  . Oxycodone Nausea And Vomiting  . Simvastatin     REACTION: Severe leg pain  . Tramadol Hcl     REACTION: Felt sick    Social Hx:   History   Social History  . Marital Status: Married    Spouse Name: N/A    Number of Children: N/A  . Years of Education: N/A   Occupational History  . Not on file.   Social History Main Topics  . Smoking status: Never Smoker   . Smokeless tobacco: Never Used  .  Alcohol Use: No  . Drug Use: No  . Sexually Active: Not on file   Other Topics Concern  . Not on file   Social History Narrative   Patient cares for 2 grand children while her daughter is at work. This limits her ability to have appointments.    Past Surgical Hx:  Past Surgical History  Procedure Date  . Thyroid surgery   . Abdominal hysterectomy     TAHBSO    Past Medical Hx:  Past Medical History  Diagnosis Date  . Pulmonary embolus   . Uterine cancer   . Thyroid disease   . Anxiety   . Diabetes mellitus   . Hyperlipidemia   . Hypertension     Family Hx:  Family History  Problem Relation Age of Onset  . Adopted: Yes    Vitals:  Blood pressure 128/72, pulse 64, temperature 98.8 F (37.1 C), temperature source Oral, resp. rate 14, height 5\' 6"  (1.676 m), weight 292 lb 11.2 oz (132.768 kg).  Physical Exam: Well-nourished well-developed female in no acute distress.   Neck: No lymphadenopathy no thyromegaly.   Lungs: Clear to auscultation.   Cardiovascular exam: Regular rate and rhythm.   Abdomen: Obese soft, tender to very deep palpation, no rebound, no guarding, nondistended there are no palpable masses or hepatosplenomegaly but exam is limited by habitus. Incison well healed. ? Small umbilical hernia.   Extremeties: 1-2+ nonpitting edema equal bilaterally.   Pelvic: External genitalia within normal limits. The vagina is well epithelialized. The vaginal cuff is visualized. There are no visible lesions. Bimanual examination is limited by habitus. However, there are no masses or nodularity. Rectovaginal exam confirms.   Assessment/Plan:  56 year old with stage IA grade 1 endometrial carcinoma diagnosed and treated in April 2010 who clinically has no evidence of recurrent disease. She is up-to-date on all of her other surveillance followup excluding mammograms.We will get her set up for a CT scan and call her with the results.  The pain could be from her chronic  cough and if she had developed a hernia.  She will return in 6 months and we will follow up on the results of her pap smear.     Nikhil Osei A., MD 09/05/2011, 9:33 AM

## 2011-09-05 NOTE — Progress Notes (Signed)
Addended by: Randel Pigg on: 09/05/2011 10:05 AM   Modules accepted: Orders

## 2011-09-05 NOTE — Progress Notes (Signed)
Addended by: Abram Sander on: 09/05/2011 09:51 AM   Modules accepted: Orders

## 2011-09-05 NOTE — Patient Instructions (Signed)
Follow up for CT scan. 

## 2011-09-10 ENCOUNTER — Ambulatory Visit (INDEPENDENT_AMBULATORY_CARE_PROVIDER_SITE_OTHER): Payer: BC Managed Care – PPO | Admitting: *Deleted

## 2011-09-10 ENCOUNTER — Ambulatory Visit (HOSPITAL_COMMUNITY)
Admission: RE | Admit: 2011-09-10 | Discharge: 2011-09-10 | Disposition: A | Discharge status: Home / Self Care | Payer: BC Managed Care – PPO | Source: Ambulatory Visit | Attending: Gynecologic Oncology | Admitting: Gynecologic Oncology

## 2011-09-10 DIAGNOSIS — K8020 Calculus of gallbladder without cholecystitis without obstruction: Secondary | ICD-10-CM | POA: Insufficient documentation

## 2011-09-10 DIAGNOSIS — R109 Unspecified abdominal pain: Secondary | ICD-10-CM

## 2011-09-10 DIAGNOSIS — K429 Umbilical hernia without obstruction or gangrene: Secondary | ICD-10-CM | POA: Insufficient documentation

## 2011-09-10 DIAGNOSIS — Z90710 Acquired absence of both cervix and uterus: Secondary | ICD-10-CM | POA: Insufficient documentation

## 2011-09-10 DIAGNOSIS — Q762 Congenital spondylolisthesis: Secondary | ICD-10-CM | POA: Insufficient documentation

## 2011-09-10 DIAGNOSIS — N200 Calculus of kidney: Secondary | ICD-10-CM | POA: Insufficient documentation

## 2011-09-10 DIAGNOSIS — Z7901 Long term (current) use of anticoagulants: Secondary | ICD-10-CM

## 2011-09-10 DIAGNOSIS — M549 Dorsalgia, unspecified: Secondary | ICD-10-CM | POA: Insufficient documentation

## 2011-09-10 DIAGNOSIS — I2699 Other pulmonary embolism without acute cor pulmonale: Secondary | ICD-10-CM

## 2011-09-10 DIAGNOSIS — C55 Malignant neoplasm of uterus, part unspecified: Secondary | ICD-10-CM

## 2011-09-10 DIAGNOSIS — C549 Malignant neoplasm of corpus uteri, unspecified: Secondary | ICD-10-CM | POA: Insufficient documentation

## 2011-09-10 DIAGNOSIS — K7689 Other specified diseases of liver: Secondary | ICD-10-CM | POA: Insufficient documentation

## 2011-09-10 LAB — HEPATIC FUNCTION PANEL
Albumin: 4.3 g/dL (ref 3.5–5.2)
Alkaline Phosphatase: 50 U/L (ref 39–117)
Total Bilirubin: 0.7 mg/dL (ref 0.3–1.2)
Total Protein: 6.8 g/dL (ref 6.0–8.3)

## 2011-09-10 LAB — POCT INR: INR: 1.8

## 2011-09-10 MED ORDER — IOHEXOL 300 MG/ML  SOLN
100.0000 mL | Freq: Once | INTRAMUSCULAR | Status: AC | PRN
  Administered 2011-09-10: 100 mL via INTRAVENOUS

## 2011-09-11 ENCOUNTER — Encounter: Payer: Self-pay | Admitting: Family Medicine

## 2011-09-13 ENCOUNTER — Ambulatory Visit: Payer: BC Managed Care – PPO

## 2011-09-14 ENCOUNTER — Telehealth: Payer: Self-pay | Admitting: *Deleted

## 2011-09-14 NOTE — Telephone Encounter (Signed)
Notified patient that the pap smear that was taken on 09/05/2011 by Dr. Duard Brady was negative

## 2011-09-17 ENCOUNTER — Encounter: Payer: Self-pay | Admitting: Family Medicine

## 2011-09-19 ENCOUNTER — Other Ambulatory Visit: Payer: Self-pay | Admitting: Family Medicine

## 2011-09-20 ENCOUNTER — Other Ambulatory Visit: Payer: Self-pay | Admitting: Family Medicine

## 2011-09-20 NOTE — Telephone Encounter (Signed)
This actually printed in office.  Will place in to be faxed box.

## 2011-09-20 NOTE — Telephone Encounter (Signed)
I tired to approve this last night and route to you to have it called in.  Not sure if I managed to do that correctly.  Do you know if it has been called in?  THanks! SJ

## 2011-09-20 NOTE — Telephone Encounter (Signed)
Tried to approve already, but needs to be called in (cannot do electronically). Could you call in?  Thanks!

## 2011-09-24 ENCOUNTER — Ambulatory Visit (INDEPENDENT_AMBULATORY_CARE_PROVIDER_SITE_OTHER): Payer: BC Managed Care – PPO | Admitting: *Deleted

## 2011-09-24 DIAGNOSIS — I2699 Other pulmonary embolism without acute cor pulmonale: Secondary | ICD-10-CM

## 2011-09-24 DIAGNOSIS — Z7901 Long term (current) use of anticoagulants: Secondary | ICD-10-CM

## 2011-09-24 LAB — HEMOCCULT GUIAC POC 1CARD (OFFICE)

## 2011-09-24 NOTE — Progress Notes (Signed)
Drew Vit D level and calcium along with INR check BAJORDAN, MLS

## 2011-09-24 NOTE — Addendum Note (Signed)
Addended by: Sw[place]iland, Meaghan Whistler on: 09/24/2011 04:42 PM   Modules accepted: Orders

## 2011-09-25 LAB — VITAMIN D 25 HYDROXY (VIT D DEFICIENCY, FRACTURES): Vit D, 25-Hydroxy: 25 ng/mL — ABNORMAL LOW (ref 30–89)

## 2011-09-27 ENCOUNTER — Encounter: Payer: Self-pay | Admitting: Family Medicine

## 2011-10-01 ENCOUNTER — Encounter: Payer: Self-pay | Admitting: Family Medicine

## 2011-10-08 ENCOUNTER — Ambulatory Visit (INDEPENDENT_AMBULATORY_CARE_PROVIDER_SITE_OTHER): Payer: BC Managed Care – PPO | Admitting: *Deleted

## 2011-10-08 DIAGNOSIS — I2699 Other pulmonary embolism without acute cor pulmonale: Secondary | ICD-10-CM

## 2011-10-08 DIAGNOSIS — Z7901 Long term (current) use of anticoagulants: Secondary | ICD-10-CM

## 2011-10-11 ENCOUNTER — Other Ambulatory Visit: Payer: Self-pay | Admitting: Family Medicine

## 2011-10-11 DIAGNOSIS — E1165 Type 2 diabetes mellitus with hyperglycemia: Secondary | ICD-10-CM

## 2011-10-11 DIAGNOSIS — E118 Type 2 diabetes mellitus with unspecified complications: Secondary | ICD-10-CM

## 2011-10-18 ENCOUNTER — Other Ambulatory Visit: Payer: Self-pay | Admitting: Family Medicine

## 2011-10-18 ENCOUNTER — Encounter: Payer: Self-pay | Admitting: Family Medicine

## 2011-10-18 ENCOUNTER — Ambulatory Visit (INDEPENDENT_AMBULATORY_CARE_PROVIDER_SITE_OTHER): Payer: BC Managed Care – PPO | Admitting: Family Medicine

## 2011-10-18 ENCOUNTER — Telehealth: Payer: Self-pay | Admitting: Family Medicine

## 2011-10-18 VITALS — BP 120/71 | HR 114 | Temp 98.5°F | Ht 66.0 in | Wt 290.0 lb

## 2011-10-18 DIAGNOSIS — N200 Calculus of kidney: Secondary | ICD-10-CM

## 2011-10-18 DIAGNOSIS — R300 Dysuria: Secondary | ICD-10-CM

## 2011-10-18 HISTORY — DX: Calculus of kidney: N20.0

## 2011-10-18 LAB — POCT URINALYSIS DIPSTICK
Protein, UA: >=300
Spec Grav, UA: 1.02
Urobilinogen, UA: 0.2
pH, UA: 5.5

## 2011-10-18 LAB — POCT UA - MICROSCOPIC ONLY: WBC, Ur, HPF, POC: >20

## 2011-10-18 LAB — BASIC METABOLIC PANEL
CO2: 27 mEq/L (ref 19–32)
Chloride: 97 mEq/L (ref 96–112)
Creat: 1.69 mg/dL — ABNORMAL HIGH (ref 0.50–1.10)
Glucose, Bld: 175 mg/dL — ABNORMAL HIGH (ref 70–99)
Sodium: 138 mEq/L (ref 135–145)

## 2011-10-18 NOTE — Assessment & Plan Note (Addendum)
Likely the cause for pain and RBCs on UA.  Will check Cr to r/o acute kidney injury, including glomerular nephritis (although no casts in urine).  Will also send for culture given frequent UTIs in the past and WBCs on micro (but can also be caused by stone). Red flags for return discussed. F/u next week.

## 2011-10-18 NOTE — Assessment & Plan Note (Signed)
C/o some frequency, although UA more c/w stone than infection. Send for culture, treat if positive.

## 2011-10-18 NOTE — Telephone Encounter (Signed)
Called pt to inform of mildly elevated Cr-- would not necessarily expect this from just a stone, but pt also using some NSAIDs for pain relief lately, so may also be contributing.  Encouraged pt to drink lots of water and avoid NSAIDs to help with kidney function.  Pt reports that urine has lightened up since earlier today and only having mild abdominal cramping now.  Encouraged tylenol for pain relief if needed.  Pt will f/u NEXT week-- will need repeat BMET to f/u Cr and consider repeat UA to ensure no casts.  Told pt I will be watching for urine cx and will call and inform her if she needs abx called in.  Patient was appreciative and had no further questions or concerns.

## 2011-10-18 NOTE — Patient Instructions (Addendum)
It was good to see you today.  I'm glad that your pain is better.  I think that you passed a kidney stone, but we will send your urine for culture to make sure you don't also have a UTI.  We are giving you a strainer to strain your urine to try to find the stone.  If you don't find it, that's ok.  If you do, save it.  We are also checking a lab to make sure that your kidney function is ok.  I will call you if it is not normal.  Please come back or go to the ER over the weekend if the blood persists for more than a few days or if it seem to worsen, becomes bright red, you have fever/chills, or the pain returns.    Kidney Stones Kidney stones (ureteral lithiasis) are deposits that form inside your kidneys. The intense pain is caused by the stone moving through the urinary tract. When the stone moves, the ureter goes into spasm around the stone. The stone is usually passed in the urine.  CAUSES   A disorder that makes certain neck glands produce too much parathyroid hormone (primary hyperparathyroidism).   A buildup of uric acid crystals.   Narrowing (stricture) of the ureter.   A kidney obstruction present at birth (congenital obstruction).   Previous surgery on the kidney or ureters.   Numerous kidney infections.  SYMPTOMS   Feeling sick to your stomach (nauseous).   Throwing up (vomiting).   Blood in the urine (hematuria).   Pain that usually spreads (radiates) to the groin.   Frequency or urgency of urination.  DIAGNOSIS   Taking a history and physical exam.   Blood or urine tests.   Computerized X-ray scan (CT scan).   Occasionally, an examination of the inside of the urinary bladder (cystoscopy) is performed.  TREATMENT   Observation.   Increasing your fluid intake.   Surgery may be needed if you have severe pain or persistent obstruction.  The size, location, and chemical composition are all important variables that will determine the proper choice of action  for you. Talk to your caregiver to better understand your situation so that you will minimize the risk of injury to yourself and your kidney.  HOME CARE INSTRUCTIONS   Drink enough water and fluids to keep your urine clear or pale yellow.   Strain all urine through the provided strainer. Keep all particulate matter and stones for your caregiver to see. The stone causing the pain may be as small as a grain of salt. It is very important to use the strainer each and every time you pass your urine. The collection of your stone will allow your caregiver to analyze it and verify that a stone has actually passed.   Only take over-the-counter or prescription medicines for pain, discomfort, or fever as directed by your caregiver.   Make a follow-up appointment with your caregiver as directed.   Get follow-up X-rays if required. The absence of pain does not always mean that the stone has passed. It may have only stopped moving. If the urine remains completely obstructed, it can cause loss of kidney function or even complete destruction of the kidney. It is your responsibility to make sure X-rays and follow-ups are completed. Ultrasounds of the kidney can show blockages and the status of the kidney. Ultrasounds are not associated with any radiation and can be performed easily in a matter of minutes.  SEEK IMMEDIATE MEDICAL CARE  IF:   Pain cannot be controlled with the prescribed medicine.   You have a fever.   The severity or intensity of pain increases over 18 hours and is not relieved by pain medicine.   You develop a new onset of abdominal pain.   You feel faint or pass out.  MAKE SURE YOU:   Understand these instructions.   Will watch your condition.   Will get help right away if you are not doing well or get worse.  Document Released: 03/19/2005 Document Revised: 03/08/2011 Document Reviewed: 07/15/2009 [facility] Patient Information 2012 [place], [place].

## 2011-10-18 NOTE — Progress Notes (Signed)
S: Pt comes in today for SDA for possible UTI.  Last UTI was a few months ago.  Left side and back pain 2 night ago, sharp pain, + nausea without vomiting.  Very uncomfortable, couldn't find a comfortable position.  Yesterday afternoon, pain went away in back and was just in stomach.  Noticed the dark urine (?blood) late yesterday afternoon.  This feels different than a typical UTI initially, now it's feeling more similar to a typical UTI.  Has never had kidney stones in the past.  No fevers/chills.  No pain with urination.    ROS: Per HPI  History  Smoking status  . Never Smoker   Smokeless tobacco  . Never Used    O:  Filed Vitals:   10/18/11 0841  BP: 120/71  Pulse: 114  Temp: 98.5 F (36.9 C)    Gen: NAD CV: RRR, no murmur Pulm: CTA bilat, no wheezes or crackles Abd: soft, nontender including in suprapubic area, nml bowel sounds, no CVA tenderness Ext: Warm, no chronic skin changes, no edema   A/P: 56 y.o. female p/w likely nephrolithiasis -See problem list -f/u in 1 week

## 2011-10-20 LAB — URINE CULTURE

## 2011-10-21 ENCOUNTER — Telehealth: Payer: Self-pay | Admitting: Family Medicine

## 2011-10-21 NOTE — Telephone Encounter (Signed)
Please call pt and let her know that there is a small amount of bacteria in her urine but NOT enough to be a true UTI.  We will recheck her urine this week and treat if needed.  Thanks!

## 2011-10-22 NOTE — Telephone Encounter (Signed)
Called pt. She agreed with plan. Lorenda Hatchet, Renato Battles

## 2011-10-24 ENCOUNTER — Ambulatory Visit (INDEPENDENT_AMBULATORY_CARE_PROVIDER_SITE_OTHER): Payer: BC Managed Care – PPO | Admitting: Family Medicine

## 2011-10-24 ENCOUNTER — Encounter: Payer: Self-pay | Admitting: Family Medicine

## 2011-10-24 VITALS — BP 117/77 | HR 109 | Temp 98.9°F | Ht 66.0 in | Wt 294.0 lb

## 2011-10-24 DIAGNOSIS — R809 Proteinuria, unspecified: Secondary | ICD-10-CM

## 2011-10-24 DIAGNOSIS — N390 Urinary tract infection, site not specified: Secondary | ICD-10-CM

## 2011-10-24 DIAGNOSIS — N200 Calculus of kidney: Secondary | ICD-10-CM

## 2011-10-24 DIAGNOSIS — R300 Dysuria: Secondary | ICD-10-CM

## 2011-10-24 LAB — POCT URINALYSIS DIPSTICK
Glucose, UA: 500
Nitrite, UA: NEGATIVE
Urobilinogen, UA: 0.2

## 2011-10-24 LAB — POCT UA - MICROSCOPIC ONLY
Epithelial cells, urine per micros: >20
RBC, urine, microscopic: >20
WBC, Ur, HPF, POC: >20

## 2011-10-24 LAB — BASIC METABOLIC PANEL
Potassium: 4.2 mEq/L (ref 3.5–5.3)
Sodium: 135 mEq/L (ref 135–145)

## 2011-10-24 MED ORDER — LEVOTHYROXINE SODIUM 125 MCG PO TABS: 125.0000 ug | ORAL_TABLET | Freq: Every day | ORAL | Status: DC

## 2011-10-24 MED ORDER — VERAPAMIL HCL ER 120 MG PO TBCR: 120.0000 mg | EXTENDED_RELEASE_TABLET | Freq: Every day | ORAL | Status: DC

## 2011-10-24 NOTE — Progress Notes (Signed)
S: Pt comes in today for follow up from last week for presumed kidney stone, possible UTI.  Urine cx from last week did not show UTI-- 15K multiple morphotypes of bacteria only.  Patient reports urine has cleared up.  Still having some dull pain/achiness in lower stomach, feels similar to UTIs in the past. Has been drinking a lot of water.  No NSAIDs since last visit.  No fevers or chills. No back pain.  No N/V. Did not find a stone with strainer. No frequency or dysuria, but some feelings of incomplete emptying after urinating.     ROS: Per HPI  History  Smoking status  . Never Smoker   Smokeless tobacco  . Never Used    O:  Filed Vitals:   10/24/11 0950  BP: 117/77  Pulse: 109  Temp: 98.9 F (37.2 C)    Gen: NAD CV: RRR, no murmur Pulm: CTA bilat, no wheezes or crackles Abd: soft, NT, +BS, no CVA tenderness   A/P: 56 y.o. female p/w dysyruam f/u hematuria -See problem list -f/u in PRN

## 2011-10-24 NOTE — Patient Instructions (Signed)
It was great to see you again.    I refilled your thyroid and one of your blood pressure medicines to [facility].  They should be waiting for you.  I'm sorry you're still having this discomfort.  I'm glad that the blood has cleared from your urine!  We will also recheck your kidney function today.  It looks like you could have another UTI.  We will send it for culture (which should only take 1-2 days to come back) so we can see what we are treating (espcially since you always get a yeast infection after antibiotics which is difficult to clear and influences your coumadin dosing).  Please call if you start having nausea, back pain, worsening pain with urination, or fever and I will call you in an antibiotic right away.  Otherwise, I will call tomorrow with your kidney function results and tell you if there is a true UTI tomorrow or the next day.

## 2011-10-24 NOTE — Assessment & Plan Note (Signed)
Pain resolved, still with some RBCs in urine.  Recheck Cr.  No casts seen on micro. No stone caught by pt, but likely it had passed prior to eval last week.

## 2011-10-24 NOTE — Assessment & Plan Note (Signed)
Still with some subprapubic discomfort all the time, not worsened with palpation on exam.  WBCs, RBCs, epis, and bacteria on micro of urine.  Will send to cx prior to Rx'ing medication as pt has long history of UTIs and treatment almost always causes yeast infection, which causes a need for Coumadin dose change.  Pt going away on vacation next week and concerned about INR being influenced by meds while she is away and cannot have it checked.  Already schedule with Kendal Hymen in ~10 days (Monday she is back after vacation).

## 2011-10-25 ENCOUNTER — Encounter: Payer: Self-pay | Admitting: Family Medicine

## 2011-10-26 ENCOUNTER — Telehealth: Payer: Self-pay | Admitting: Family Medicine

## 2011-10-26 ENCOUNTER — Encounter: Payer: Self-pay | Admitting: Family Medicine

## 2011-10-26 LAB — URINE CULTURE: Colony Count: 25000

## 2011-10-26 NOTE — Telephone Encounter (Signed)
Called pt and informed of Dr.McGill's message. Pt verbalized understanding. Lorenda Hatchet, Renato Battles

## 2011-10-26 NOTE — Telephone Encounter (Signed)
Please call pt and let her know that again, there is a small amount of bacteria in her urine, but not enough to call it a true infection.  Please ask if she is still having frequency/dysuria or if she has any new symptoms (fevers, nausea).  Also, please let her know that her kidney function has improved some, but is not quite 100% normal (nothing that I'm overly concerned about).  She should follow up with her PCP in the next few weeks and we will recheck her labs then.  Thanks!

## 2011-11-05 ENCOUNTER — Ambulatory Visit (INDEPENDENT_AMBULATORY_CARE_PROVIDER_SITE_OTHER): Payer: BC Managed Care – PPO | Admitting: *Deleted

## 2011-11-05 DIAGNOSIS — I2699 Other pulmonary embolism without acute cor pulmonale: Secondary | ICD-10-CM

## 2011-11-05 DIAGNOSIS — E1165 Type 2 diabetes mellitus with hyperglycemia: Secondary | ICD-10-CM

## 2011-11-05 DIAGNOSIS — Z7901 Long term (current) use of anticoagulants: Secondary | ICD-10-CM

## 2011-11-05 DIAGNOSIS — E118 Type 2 diabetes mellitus with unspecified complications: Secondary | ICD-10-CM

## 2011-11-05 LAB — POCT GLYCOSYLATED HEMOGLOBIN (HGB A1C): Hemoglobin A1C: 8.4

## 2011-11-05 MED ORDER — FLUCONAZOLE 150 MG PO TABS: 150.0000 mg | ORAL_TABLET | Freq: Once | ORAL | Status: AC

## 2011-11-05 NOTE — Progress Notes (Signed)
Pt reports increased vaginal itching.  Will consult with Dr. Sarah Swaziland about Rx for fluconazole and warfarin dosing since fluconazole increases INR. Also did A1c = 8.4% per previous order. 5:00 pm - called pt with warfarin doses for next week.  Gave day by day dose while pt wrote doses on calendar.  Pt expresses understanding.  F/u next Monday.  Per Dr. Swaziland

## 2011-11-05 NOTE — Progress Notes (Signed)
Pt  Reports symptoms of vaginal itching.  Requests another order of fluconazole.  Hgb a1c elevated at 8.4.  Needs improved diabetic control. Will send rx for fluconazole.  Pt to follow up with new pcp.

## 2011-11-06 ENCOUNTER — Encounter: Payer: Self-pay | Admitting: Family Medicine

## 2011-11-12 ENCOUNTER — Ambulatory Visit (INDEPENDENT_AMBULATORY_CARE_PROVIDER_SITE_OTHER): Payer: BC Managed Care – PPO | Admitting: *Deleted

## 2011-11-12 DIAGNOSIS — Z7901 Long term (current) use of anticoagulants: Secondary | ICD-10-CM

## 2011-11-12 DIAGNOSIS — I2699 Other pulmonary embolism without acute cor pulmonale: Secondary | ICD-10-CM

## 2011-11-12 LAB — POCT INR: INR: 2.7

## 2011-11-13 ENCOUNTER — Other Ambulatory Visit: Payer: Self-pay | Admitting: Family Medicine

## 2011-11-13 NOTE — Telephone Encounter (Signed)
Please make appointment with PCP

## 2011-11-16 ENCOUNTER — Ambulatory Visit (INDEPENDENT_AMBULATORY_CARE_PROVIDER_SITE_OTHER): Payer: BC Managed Care – PPO | Admitting: Family Medicine

## 2011-11-16 VITALS — BP 115/74 | HR 121 | Temp 98.4°F | Ht 66.0 in | Wt 291.0 lb

## 2011-11-16 DIAGNOSIS — E118 Type 2 diabetes mellitus with unspecified complications: Secondary | ICD-10-CM

## 2011-11-16 DIAGNOSIS — B3731 Acute candidiasis of vulva and vagina: Secondary | ICD-10-CM

## 2011-11-16 DIAGNOSIS — IMO0002 Reserved for concepts with insufficient information to code with codable children: Secondary | ICD-10-CM

## 2011-11-16 DIAGNOSIS — E1165 Type 2 diabetes mellitus with hyperglycemia: Secondary | ICD-10-CM

## 2011-11-16 DIAGNOSIS — R300 Dysuria: Secondary | ICD-10-CM

## 2011-11-16 DIAGNOSIS — B373 Candidiasis of vulva and vagina: Secondary | ICD-10-CM

## 2011-11-16 DIAGNOSIS — N200 Calculus of kidney: Secondary | ICD-10-CM

## 2011-11-16 LAB — BASIC METABOLIC PANEL
Calcium: 8.9 mg/dL (ref 8.4–10.5)
Chloride: 99 mEq/L (ref 96–112)
Creat: 1.57 mg/dL — ABNORMAL HIGH (ref 0.50–1.10)

## 2011-11-16 LAB — CBC WITH DIFFERENTIAL/PLATELET
Eosinophils Absolute: 0.2 10*3/uL (ref 0.0–0.7)
Eosinophils Relative: 3 % (ref 0–5)
HCT: 36.2 % (ref 36.0–46.0)
Hemoglobin: 12.3 g/dL (ref 12.0–15.0)
Lymphs Abs: 1.7 10*3/uL (ref 0.7–4.0)
MCH: 29.3 pg (ref 26.0–34.0)
MCV: 86.2 fL (ref 78.0–100.0)
Monocytes Absolute: 0.3 10*3/uL (ref 0.1–1.0)
Monocytes Relative: 6 % (ref 3–12)
RBC: 4.2 MIL/uL (ref 3.87–5.11)

## 2011-11-16 LAB — POCT URINALYSIS DIPSTICK
Bilirubin, UA: NEGATIVE
Protein, UA: NEGATIVE
Urobilinogen, UA: 0.2
pH, UA: 5.5

## 2011-11-16 MED ORDER — FLUCONAZOLE 150 MG PO TABS: 150.0000 mg | ORAL_TABLET | Freq: Once | ORAL | Status: DC

## 2011-11-16 NOTE — Assessment & Plan Note (Signed)
-   Probable yeast d/t diabetic condition - Prescribed Diflucan - Advised to call if Diflucan does not clear her symptoms. Explained I would want to investigate further by performing a wet mount slide and vaginal exam. Patient is use to being prescribed Diflucan without inspection, but seemed understanding.  - F/U: if symptoms worsen

## 2011-11-16 NOTE — Assessment & Plan Note (Signed)
-   Last HgBA1c was 8.4 on 11/05/2011 - No glucose in her urine today. - CBC and BMP ordered today. - Offered Social services for problems that have arisen with her financial situation. She is currently having problems with paying for her medications since her husband has had to take a job which required a pay cut. She declined this help. - Offered Nutritional/diabetic counseling help today, she declined.  - Will follow up in 3 months for diabetic control monitoring

## 2011-11-16 NOTE — Progress Notes (Signed)
Subjective:     Patient ID: Allison [name], female   DOB: 06/07/1955, 56 y.o.   MRN: [id number]  HPI Dysuria: Patient present today for a follow up visit of dysuria d/t probable kidney stone. She is having no pain with urination,  frequency, burning with urination, abdominal or flank pain today. She feels she passed kidney stones yesterday and has brought them with her in a baggie. She denies fevers, nausea, vomit or diarrhea. She did admit to some chills a few days ago prior to passing the kidney stones.  Vaginitis: Her only complaint is vaginal itching today. She denies burning, discharge or odor from her vagina. She reports she has frequent yeast infections d/t her diabetic condition. She is accustomed to having diflucan called in for these times without exam/inspection.   Review of Systems    See HPI Objective:   Physical Exam  Constitutional: She is oriented to person, place, and time. She appears well-developed and well-nourished. No distress.       Pleasant obese female. Looks sad and tired.  Eyes: Conjunctivae and EOM are normal. Right eye exhibits no discharge. Left eye exhibits no discharge. No scleral icterus.  Neck: Neck supple.  Cardiovascular: Normal rate and regular rhythm.  Exam reveals no gallop and no friction rub.   No murmur heard. Pulmonary/Chest: Effort normal and breath sounds normal. No respiratory distress. She has no wheezes. She has no rales.  Abdominal: Soft. Bowel sounds are normal. She exhibits no distension and no mass. There is no tenderness. There is no rebound and no guarding.  Lymphadenopathy:    She has no cervical adenopathy.  Neurological: She is alert and oriented to person, place, and time.  Skin: Skin is warm and dry. She is not diaphoretic.

## 2011-11-16 NOTE — Assessment & Plan Note (Signed)
-   Specimen from home: Kidney stone analysis - UA:   - No glucose today  - Moderate blood, Leuks 1+,   -Sent for culture (micro unavailable today) - Re-check kidney function today with BMP - Tachycardia: CBC today, encouraged fluids - F/U: PRN, or pending lab results

## 2011-11-16 NOTE — Patient Instructions (Addendum)
Urinary Frequency The number of times a normal person urinates depends upon how much liquid they take in and how much liquid they are losing. If the temperature is hot and there is high humidity then the person will sweat more and usually breathe a little more frequently. These factors decrease the amount of frequency of urination that would be considered normal. The amount you drink is easily determined, but the amount of fluid lost is sometimes more difficult to calculate.  Fluid is lost in two ways:  Sensible fluid loss is usually measured by the amount of urine that you get rid of. Losses of fluid can also occur with diarrhea.   Insensible fluid loss is more difficult to measure. It is caused by evaporation. Insensible loss of fluid occurs through breathing and sweating. It usually ranges from a little less than a quart to a little more than a quart of fluid a day.  In normal temperatures and activity levels the average person may urinate 4 to 7 times in a 24-hour period. Needing to urinate more often than that could indicate a problem. If one urinates 4 to 7 times in 24 hours and has large volumes each time, that could indicate a different problem from one who urinates 4 to 7 times a day and has small volumes. The time of urinating is also an important. Most urinating should be done during the waking hours. Getting up at night to urinate frequently can indicate some problems. CAUSES  The bladder is the organ in your lower abdomen that holds urine. Like a balloon, it swells some as it fills up. Your nerves sense this and tell you it is time to head for the bathroom. There are a number of reasons that you might feel the need to urinate more often than usual. They include:  Urinary tract infection. This is usually associated with other signs such as burning when you urinate.   In men, problems with the prostate (a walnut-size gland that is located near the tube that carries urine out of your body).  There are two reasons why the prostate can cause an increased frequency of urination:   An enlarged prostate that does not let the bladder empty well. If the bladder only half empties when you urinate then it only has half the capacity to fill before you have to urinate again.   The nerves in the bladder become more hypersensitive with an increased size of the prostate even if the bladder empties completely.   Pregnancy.   Obesity. Excess weight is more likely to cause a problem for women more than for men.   Bladder stones or other bladder problems.   Caffeine.   Alcohol.   Medications. For example, drugs that help the body get rid of extra fluid (diuretics) increase urine production. Some other medicines must be taken with lots of fluids.   Muscle or nerve weakness. This might be the result of a spinal cord injury, a stroke, multiple sclerosis or Parkinson's disease.   Long-standing diabetes can decrease the sensation of the bladder. This loss of sensation makes it harder to sense the bladder needs to be emptied. Over a period of years the bladder is stretched out by constant overfilling. This weakens the bladder muscles so that the bladder does not empty well and has less capacity to fill with new urine.   Interstitial cystitis (also called painful bladder syndrome). This condition develops because the tissues that line the insider of the bladder are inflamed (  inflammation is the body's way of reacting to injury or infection). It causes pain and frequent urination. It occurs in women more often than in men.  DIAGNOSIS   To decide what might be causing your urinary frequency, your healthcare provider will probably:   Ask about symptoms you have noticed.   Ask about your overall health. This will include questions about any medications you are taking.   Do a physical examination.   Order some tests. These might include:   A blood test to check for diabetes or other health issues  that could be contributing to the problem.   Urine testing. This could measure the flow of urine and the pressure on the bladder.   A test of your neurological system (the brain, spinal cord and nerves). This is the system that senses the need to urinate.   A bladder test to check whether it is emptying completely when you urinate.   Cytoscopy. This test uses a thin tube with a tiny camera on it. It offers a look inside your urethra and bladder to see if there are problems.   Imaging tests. You might be given a contrast dye and then asked to urinate. X-rays are taken to see how your bladder is working.  TREATMENT  It is important for you to be evaluated to determine if the amount or frequency that you have is unusual or abnormal. If it is found to be abnormal the cause should be determined and this can usually be found out easily. Depending upon the cause treatment could include medication, stimulation of the nerves, or surgery. There are not too many things that you can do as an individual to change your urinary frequency. It is important that you balance the amount of fluid intake needed to compensate for your activity and the temperature. Medical problems will be diagnosed and taken care of by your physician. There is no particular bladder training such as Kegel's exercises that you can do to help urinary frequency. This is an exercise this is usually done for people who have leaking of urine when they laugh cough or sneeze. HOME CARE INSTRUCTIONS   Take any medications your healthcare provider prescribed or suggested. Follow the directions carefully.   Practice any lifestyle changes that are recommended. These might include:   Drinking less fluid or drinking at different times of the day. If you need to urinate often during the night, for example, you may need to stop drinking fluids early in the evening.   Cutting down on caffeine or alcohol. They both can make you need to urinate more  often than normal. Caffeine is found in coffee, tea and sodas.   Losing weight, if that is recommended.   Keep a journal or a log. You might be asked to record how much you drink and when and when you feel the need to urinate. This will also help evaluate how well the treatment provided by your physician is working.  SEEK MEDICAL CARE IF:   Your need to urinate often gets worse.   You feel increased pain or irritation when you urinate.   You notice blood in your urine.   You have questions about any medications that your healthcare provider recommended.   You notice blood, pus or swelling at the site of any test or treatment procedure.   You develop a fever of more than 100.5 F (38.1 C).  SEEK IMMEDIATE MEDICAL CARE IF:  You develop a fever of more than 102.0   F (38.9 C). Document Released: 01/13/2009 Document Revised: 03/08/2011 Document Reviewed: 01/13/2009 Exit[employer] Patient Information 2012 [employer]. 

## 2011-11-19 ENCOUNTER — Ambulatory Visit (INDEPENDENT_AMBULATORY_CARE_PROVIDER_SITE_OTHER): Payer: BC Managed Care – PPO | Admitting: *Deleted

## 2011-11-19 DIAGNOSIS — Z7901 Long term (current) use of anticoagulants: Secondary | ICD-10-CM

## 2011-11-19 DIAGNOSIS — I2699 Other pulmonary embolism without acute cor pulmonale: Secondary | ICD-10-CM

## 2011-11-21 LAB — STONE ANALYSIS: Stone Weight KSTONE: 0.017 g

## 2011-11-26 ENCOUNTER — Ambulatory Visit (INDEPENDENT_AMBULATORY_CARE_PROVIDER_SITE_OTHER): Payer: BC Managed Care – PPO | Admitting: *Deleted

## 2011-11-26 DIAGNOSIS — I2699 Other pulmonary embolism without acute cor pulmonale: Secondary | ICD-10-CM

## 2011-11-26 DIAGNOSIS — Z7901 Long term (current) use of anticoagulants: Secondary | ICD-10-CM

## 2011-11-27 ENCOUNTER — Other Ambulatory Visit: Payer: Self-pay | Admitting: Family Medicine

## 2011-11-27 DIAGNOSIS — N200 Calculus of kidney: Secondary | ICD-10-CM

## 2011-11-28 ENCOUNTER — Telehealth: Payer: Self-pay | Admitting: *Deleted

## 2011-11-28 NOTE — Telephone Encounter (Signed)
Patient here in office today for lab visit.  Completed form to request PCP change.  Patient wants to change to faculty MD.  Has had Dr. Darrick Penna and Dr. Swaziland as PCP in past.  Patient has been on Albuterol "for years" and states that Dr. Claiborne Billings changed it to ProAir.  "ProAir" does not seem like it gives enough medication/air" and patient is questioning this med change.  Patient states she wants a doctor that "has enough experience to deal with my extensive medical history."  Past medical history includes endometrial cancer, hysterectomy, "blood clots in lungs" and has been on Coumadin for 4-5 years.  Patient had an office visit with Dr. Claiborne Billings on 11/16/11 and states she does not feel like the issues that she came for were addressed.  Appt was for "lower back and abdominal pain."  Patient states Dr. Claiborne Billings was more focused on "depression and weight loss."  While here for lab visit, patient brought in smaller pieces of kidney stone.  Patient again requesting faculty MD because Dr. Claiborne Billings "did not order any xrays to check on the kidney stone" that she passed the morning of the office visit.  I explained process for changing PCP and that Dr. Claiborne Billings would call to address any concerns patient had.  Patient "does not feel comfortable" with Dr. Claiborne Billings calling her.  I will discuss with Dr. Claiborne Billings and Spero Geralds and call patient back.  Patient agreeable with plan.  Gaylene Brooks, RN

## 2011-12-05 ENCOUNTER — Ambulatory Visit (INDEPENDENT_AMBULATORY_CARE_PROVIDER_SITE_OTHER): Payer: BC Managed Care – PPO | Admitting: *Deleted

## 2011-12-05 DIAGNOSIS — Z7901 Long term (current) use of anticoagulants: Secondary | ICD-10-CM

## 2011-12-05 DIAGNOSIS — I2699 Other pulmonary embolism without acute cor pulmonale: Secondary | ICD-10-CM

## 2011-12-05 LAB — POCT INR: INR: 2.8

## 2011-12-13 ENCOUNTER — Other Ambulatory Visit: Payer: Self-pay | Admitting: Family Medicine

## 2011-12-13 NOTE — Telephone Encounter (Signed)
Lupita Leash,  This is no longer a patient of mine. She has refused to follow with me and has been transferred to another PCP. She will have to get her meds from that person. Sorry I could not help.

## 2011-12-14 NOTE — Telephone Encounter (Signed)
I sent the message to Lupita Leash. Which is who sent it to me. Should I have also sent it to someone else? I am not certain if was pharmacy or patient requesting it. However, since she refused my care... Not sure of that date (last week of August)... I assumed after everything she was going to be switched to another provider in our system or another PCP. I never heard back on the matter.   I am certainly willing to give her medications for the 30 days if she went to someone else outside our system.

## 2011-12-17 ENCOUNTER — Other Ambulatory Visit: Payer: Self-pay | Admitting: Family Medicine

## 2011-12-18 ENCOUNTER — Encounter: Payer: Self-pay | Admitting: *Deleted

## 2011-12-18 ENCOUNTER — Other Ambulatory Visit: Payer: Self-pay | Admitting: Family Medicine

## 2011-12-18 NOTE — Telephone Encounter (Signed)
I have seen this patient once for f/u to UTI and Kidney stone. She has never actually "established" care with me. She refused to see me as her PCP and refused to speak with me about it. I will refill her prescription request this one time for xanax, when I am in office of Friday afternoon, for 1 month. She will need to establish with her new provider and request further prescriptions through them.   I am no longer her PCP.

## 2011-12-18 NOTE — Telephone Encounter (Signed)
I refilled, but I am not certain if this my patient any longer.

## 2011-12-18 NOTE — Telephone Encounter (Signed)
I spoke with Dr. Deirdre Priest and unable to change patient's PCP to faculty MD.  I called Ms. Allison [name] and discussed her options for PCP.  Patient states she is willing to continue having Dr. Claiborne Billings as her PCP and does not want to transfer to another office.  Will inform Dr. Claiborne Billings.  Pharmacy sent refill request to our office for aprazolam.  Patient has enough med to last her until tomorrow.  Will inform Dr. Claiborne Billings and call patient back  Tomorrow.  Gaylene Brooks, RN

## 2011-12-18 NOTE — Telephone Encounter (Signed)
This encounter was created in error - please disregard.

## 2011-12-18 NOTE — Telephone Encounter (Signed)
I am currently on nights inpatient service. I will be in office Friday afternoon and can take care of this then. I will write for one month prescription. She will need to follow up with her new PCP/provider to receive any other medication refills. This patient requested another provider and refused to speak with me.   I am no longer her provider.

## 2011-12-21 ENCOUNTER — Ambulatory Visit (INDEPENDENT_AMBULATORY_CARE_PROVIDER_SITE_OTHER): Payer: BC Managed Care – PPO | Admitting: *Deleted

## 2011-12-21 DIAGNOSIS — Z23 Encounter for immunization: Secondary | ICD-10-CM

## 2011-12-21 DIAGNOSIS — I2699 Other pulmonary embolism without acute cor pulmonale: Secondary | ICD-10-CM

## 2011-12-21 DIAGNOSIS — Z7901 Long term (current) use of anticoagulants: Secondary | ICD-10-CM

## 2011-12-21 LAB — POCT INR: INR: 2.7

## 2012-01-01 ENCOUNTER — Other Ambulatory Visit: Payer: Self-pay | Admitting: Family Medicine

## 2012-01-01 NOTE — Telephone Encounter (Signed)
I will approve this prescription. It will be the last I approve. She is starting with a new provider on 01/11/2012. Her request need to be routed to that person. Thank you.

## 2012-01-11 ENCOUNTER — Ambulatory Visit (INDEPENDENT_AMBULATORY_CARE_PROVIDER_SITE_OTHER): Payer: BC Managed Care – PPO | Admitting: *Deleted

## 2012-01-11 DIAGNOSIS — I2699 Other pulmonary embolism without acute cor pulmonale: Secondary | ICD-10-CM

## 2012-01-11 DIAGNOSIS — Z7901 Long term (current) use of anticoagulants: Secondary | ICD-10-CM

## 2012-02-01 ENCOUNTER — Ambulatory Visit: Payer: BC Managed Care – PPO | Admitting: Family Medicine

## 2012-02-08 ENCOUNTER — Ambulatory Visit (INDEPENDENT_AMBULATORY_CARE_PROVIDER_SITE_OTHER): Payer: BC Managed Care – PPO | Admitting: *Deleted

## 2012-02-08 DIAGNOSIS — Z7901 Long term (current) use of anticoagulants: Secondary | ICD-10-CM

## 2012-02-08 DIAGNOSIS — I2699 Other pulmonary embolism without acute cor pulmonale: Secondary | ICD-10-CM

## 2012-02-11 NOTE — Telephone Encounter (Signed)
Response from Dr. Claiborne Billings on 11/28/11:  I have reviewed Allison [name] request and I do not have a problem with her switching PCP if she would so desires. This particular patient was also one of my video precepted patients; Dr.Kane I reviewed this already and found no issues with the handling of the visit. It was also reviewed with a preceptor on the day of visit. As far as the large stone she passed last week, I was on vacation and she handed that to the lab technician and did not request to see a doctor. I did not learn of this until Monday late afternoon of 8/26.      It is in my opinion Allison [name] will not be happy with her care unless it is with a faculty member. She was not pleased to be switched from Dr. Swaziland.

## 2012-02-11 NOTE — Telephone Encounter (Signed)
Discussed with Dr. Pascal Lux due to Dr. Claiborne Billings not comfortable continuing to be patient's PCP based on .  Will change patient to a different PCP (possibly [facility]).  Called patient and left message to call our office back.  Gaylene Brooks, RN

## 2012-02-13 ENCOUNTER — Other Ambulatory Visit: Payer: Self-pay | Admitting: Family Medicine

## 2012-02-14 ENCOUNTER — Ambulatory Visit: Payer: BC Managed Care – PPO | Admitting: Family Medicine

## 2012-02-21 ENCOUNTER — Ambulatory Visit (INDEPENDENT_AMBULATORY_CARE_PROVIDER_SITE_OTHER): Payer: BC Managed Care – PPO | Admitting: Family Medicine

## 2012-02-21 ENCOUNTER — Encounter: Payer: Self-pay | Admitting: Family Medicine

## 2012-02-21 VITALS — BP 120/78 | HR 93 | Ht 66.0 in | Wt 289.0 lb

## 2012-02-21 DIAGNOSIS — L293 Anogenital pruritus, unspecified: Secondary | ICD-10-CM

## 2012-02-21 DIAGNOSIS — N898 Other specified noninflammatory disorders of vagina: Secondary | ICD-10-CM

## 2012-02-21 DIAGNOSIS — R300 Dysuria: Secondary | ICD-10-CM

## 2012-02-21 LAB — POCT WET PREP (WET MOUNT)

## 2012-02-21 LAB — POCT URINALYSIS DIPSTICK
Bilirubin, UA: NEGATIVE
Ketones, UA: NEGATIVE
Spec Grav, UA: 1.01
pH, UA: 5.5

## 2012-02-21 MED ORDER — CEPHALEXIN 500 MG PO CAPS: 500.0000 mg | ORAL_CAPSULE | Freq: Four times a day (QID) | ORAL | Status: DC

## 2012-02-21 MED ORDER — ESTROGENS, CONJUGATED 0.625 MG/GM VA CREA: TOPICAL_CREAM | Freq: Every day | VAGINAL | Status: DC

## 2012-02-21 NOTE — Assessment & Plan Note (Signed)
Wet prep negative for yeast.  On exam, some redness but story more c/w vaginal atrophy.  Will try topical estrogen and f/u in 2 weeks.

## 2012-02-21 NOTE — Patient Instructions (Signed)
It was good to see you again.  We will treat your urinary tract infection with 7 days of Keflex.  I think that your vaginal itching may be due to atrophy rather than an infection.  Let's try topical estrogen for a few weeks and see if it helps.  Use the suppository every day for 2 weeks, then we will decrease to 2 times per week.  Come back to see me in 2-3 weeks so we can check on your diabetes and see how the itching it responding to the new medicine.

## 2012-02-21 NOTE — Assessment & Plan Note (Addendum)
Will treat for UTI based on history and UA/micro results.  Treat with Keflex x7 days.

## 2012-02-21 NOTE — Progress Notes (Signed)
S: Pt comes in today for appt to meet new MD.  Was previously a patient of Dr. Elvis Coil, changed to [facility] and requested to be changed back to faculty.  Also with ?UTI today.  UTI Increased frequency, mild abdominal pain.  Feels like she usually does when she has a UTI.  Has been off and on for a few weeks.  No N/V, no back pain.  No fevers at home.     VAGINAL ITCHING Worst itching she has ever had.  Has previously used diflucan- which helps for a week or 2 but then it comes back.  Hasn't noticed any vaginal dryness.  No discharge.   Burning with urination.  Trying not to scratch at it.  Has tried "everything"- OTC yeast suppository and cream.    ROS: Per HPI  History  Smoking status  . Never Smoker   Smokeless tobacco  . Never Used    O:  Filed Vitals:   02/21/12 0915  BP: 120/78  Pulse: 93    Gen: NAD Abd: soft, nontender, +BS Pelvic: external genitalia normal without rash, moderately erythematous vaginal mucosa without obvious dryness    A/P: 56 y.o. female p/w UTI, vaginal itching  -See problem list -f/u in 2-3 weeks

## 2012-03-03 ENCOUNTER — Other Ambulatory Visit: Payer: Self-pay | Admitting: Family Medicine

## 2012-03-07 ENCOUNTER — Ambulatory Visit (INDEPENDENT_AMBULATORY_CARE_PROVIDER_SITE_OTHER): Payer: BC Managed Care – PPO | Admitting: *Deleted

## 2012-03-07 ENCOUNTER — Telehealth: Payer: Self-pay | Admitting: Family Medicine

## 2012-03-07 DIAGNOSIS — Z7901 Long term (current) use of anticoagulants: Secondary | ICD-10-CM

## 2012-03-07 DIAGNOSIS — I2699 Other pulmonary embolism without acute cor pulmonale: Secondary | ICD-10-CM

## 2012-03-07 LAB — POCT INR: INR: 3

## 2012-03-07 NOTE — Telephone Encounter (Signed)
Called pt. LMVM to call back. Please see Dr.McGill's message. Thank you. Lorenda Hatchet, Renato Battles

## 2012-03-07 NOTE — Telephone Encounter (Signed)
Called pt. We usually do not call in Alprazolam. Last Rx was filled by Dr.Kuneff (12-18-11). Pt said, that they always call in the Rx to her pharmacy.  She has upcoming appt with Dr.McGill (PCP) on 03/18/12. I will fwd. To request to her. Lorenda Hatchet, Renato Battles

## 2012-03-07 NOTE — Telephone Encounter (Signed)
Patient is calling because [facility] in [place] isn't doing a very good job of getting the refill request for Alprazolam sent in.  She is completely out of her medication and would appreciate the refill being sent in today.

## 2012-03-07 NOTE — Telephone Encounter (Signed)
I'll plan to fill it on 12/17 when I see her. Thanks!

## 2012-03-10 ENCOUNTER — Other Ambulatory Visit: Payer: Self-pay | Admitting: *Deleted

## 2012-03-10 NOTE — Telephone Encounter (Signed)
Message from Dr. Fara Boros was given and patient voiced understanding.

## 2012-03-12 ENCOUNTER — Other Ambulatory Visit: Payer: Self-pay | Admitting: Family Medicine

## 2012-03-12 ENCOUNTER — Ambulatory Visit: Payer: BC Managed Care – PPO

## 2012-03-12 ENCOUNTER — Ambulatory Visit: Payer: BC Managed Care – PPO | Admitting: Family Medicine

## 2012-03-18 ENCOUNTER — Ambulatory Visit (INDEPENDENT_AMBULATORY_CARE_PROVIDER_SITE_OTHER): Payer: BC Managed Care – PPO | Admitting: Family Medicine

## 2012-03-18 ENCOUNTER — Ambulatory Visit (INDEPENDENT_AMBULATORY_CARE_PROVIDER_SITE_OTHER): Payer: BC Managed Care – PPO | Admitting: *Deleted

## 2012-03-18 ENCOUNTER — Encounter: Payer: Self-pay | Admitting: Family Medicine

## 2012-03-18 VITALS — BP 150/80 | HR 88 | Ht 66.0 in | Wt 287.0 lb

## 2012-03-18 DIAGNOSIS — E039 Hypothyroidism, unspecified: Secondary | ICD-10-CM

## 2012-03-18 DIAGNOSIS — IMO0002 Reserved for concepts with insufficient information to code with codable children: Secondary | ICD-10-CM

## 2012-03-18 DIAGNOSIS — E1165 Type 2 diabetes mellitus with hyperglycemia: Secondary | ICD-10-CM

## 2012-03-18 DIAGNOSIS — L293 Anogenital pruritus, unspecified: Secondary | ICD-10-CM

## 2012-03-18 DIAGNOSIS — E118 Type 2 diabetes mellitus with unspecified complications: Secondary | ICD-10-CM

## 2012-03-18 DIAGNOSIS — I2699 Other pulmonary embolism without acute cor pulmonale: Secondary | ICD-10-CM

## 2012-03-18 DIAGNOSIS — Z7901 Long term (current) use of anticoagulants: Secondary | ICD-10-CM

## 2012-03-18 DIAGNOSIS — M7989 Other specified soft tissue disorders: Secondary | ICD-10-CM

## 2012-03-18 DIAGNOSIS — R300 Dysuria: Secondary | ICD-10-CM

## 2012-03-18 DIAGNOSIS — I10 Essential (primary) hypertension: Secondary | ICD-10-CM

## 2012-03-18 DIAGNOSIS — N898 Other specified noninflammatory disorders of vagina: Secondary | ICD-10-CM

## 2012-03-18 DIAGNOSIS — F411 Generalized anxiety disorder: Secondary | ICD-10-CM

## 2012-03-18 LAB — POCT URINALYSIS DIPSTICK
Bilirubin, UA: NEGATIVE
Glucose, UA: NEGATIVE
Nitrite, UA: NEGATIVE
Spec Grav, UA: 1.02

## 2012-03-18 LAB — POCT UA - MICROSCOPIC ONLY

## 2012-03-18 LAB — COMPREHENSIVE METABOLIC PANEL
Alkaline Phosphatase: 44 U/L (ref 39–117)
Glucose, Bld: 92 mg/dL (ref 70–99)
Sodium: 140 mEq/L (ref 135–145)
Total Bilirubin: 0.5 mg/dL (ref 0.3–1.2)
Total Protein: 6.9 g/dL (ref 6.0–8.3)

## 2012-03-18 LAB — POCT INR: INR: 2.9

## 2012-03-18 MED ORDER — ALPRAZOLAM 0.5 MG PO TABS: 0.5000 mg | ORAL_TABLET | Freq: Three times a day (TID) | ORAL | Status: DC | PRN

## 2012-03-18 MED ORDER — TERCONAZOLE 0.4 % VA CREA: 1.0000 | TOPICAL_CREAM | Freq: Every day | VAGINAL | Status: DC

## 2012-03-18 NOTE — Assessment & Plan Note (Signed)
Check TSH today

## 2012-03-18 NOTE — Assessment & Plan Note (Signed)
A1c improved to 7.6 from 8.5 on 11/2011; continue glyburide and metformin.

## 2012-03-18 NOTE — Assessment & Plan Note (Signed)
Elevated today but usually well controlled.  No changes to current regimen of lisino/HCTZ 20/12.5 and verapamil 120 qhs.

## 2012-03-18 NOTE — Progress Notes (Signed)
S: Pt comes in today for follow up.  XANAX Wants her xanax refilled.  Patient reports that this helps with her anxiety.  Usually needs it 1-2 times per day.    DYSURIA/Vaginal itching This is a frequent complaint of the patient.  She was last treated for UTI on 11/21 and also started on topical estrogen at that time.  Is having some burning with urination but is also having vaginal itchiness.  Doesn't think the cream helped at all.  No discharge.  No vaginal pain.  Has tried OTC monistat with minimal relief, but came right back.  Has had itching off and on since hysterectomy in 2011 (was done for cancer).  Diflucan has previously helped but last 2 times.     HYPERTENSION BP: 150/80 (usually well controlled)  Meds: lisinopril/HCTZ 20/12.5, verapamil 120 qhs  Taking meds: Yes     # of doses missed/week: 0 Symptoms: Headache: No Dizziness: No Vision changes: No SOB:  No Chest pain: No LE swelling: No Tobacco use: No   DIABETES Home CBGs: meter is broken  Meds: glyburide 5 BID, metformin 1000 BID Taking Meds: yes # of doses missed per week: 0 Hypoglycemic episodes?: no Symptoms: Polyuria: yes Polydipsia: no Parasthesias: no   Dizziness: no  Nausea:  no Vomiting:  no Last A1c: today-- 7.6   R LEG SWELLING Has been throbbing for "a while"- seems red and swollen in the back.  Swelling gets a little better at night with having her leg up.  Burning feeling sometimes.  Has been going on for a few months.  No fevers/chills.     ROS: Per HPI  History  Smoking status  . Never Smoker   Smokeless tobacco  . Never Used    O:  Filed Vitals:   03/18/12 1443  BP: 150/80  Pulse: 88    Gen: NAD, obese CV: RRR, no murmur Pulm: CTA bilat, no wheezes or crackles Ext: Warm, L leg large but soft and painless; R leg tight and swollen, mild TTP over the medial and lateral areas of her lower leg without posterior TTP with minimal diffuse erythema that is relatively insignificant, minimal  warmth at best when comparing R to L calf/shin; 2+ pedal pulses bilaterally   A/P: 56 y.o. female p/w vaginal itching, RLE edema, DM, HTN, anxiety -See problem list -f/u in 3-4 weeks

## 2012-03-18 NOTE — Assessment & Plan Note (Signed)
UA and micro not c/w infection.  No abx today.  Will treat vaginal itching first.  May have component of interstitial cystitis with her frequent "UTI" complaints but many negative urine cultures.

## 2012-03-18 NOTE — Assessment & Plan Note (Signed)
Precepted with Dr. Deirdre Priest who also examined the pt.  Unclear etiology-- does not appear to be infectious, although cannot full rule out phlebitis, although erythema ir very mild and diffuse as is the pain.  Does not appear to be cellulitis.  Will try conservative mgmt with compression hoes and elevation. Will get LE doppler to r/o DVT with pt's h/o PE although low suspicion for this given pt's therapeutic INR (2.9 today). Would need to consider pelvic imaging if swelling continues to worsen without clear cause since pt has h/o uterine adenocarcinoma s/p hysterectomy in 2011.  Most recent abd/pelvic CT 09/2011 did not show any masses, so would be unlikely to have a large mass causing a blockage, but would need to r/o.

## 2012-03-18 NOTE — Patient Instructions (Signed)
It was good to see you today.  For your leg, try to keep it elevated.  Go to the medical supply store and get some support hoes (not the tailored compression stockings, which are much more expensive). Try taking some motrin 400-600mg  every 6 hours to see if that helps any inflammation in your leg. Go to the hospital to have an ultrasound done of that leg to make sure it isn't a clot, but my suspicion for this is low. We may consider some imagining of your pelvis if it gets worse.  You can think about changing from Coumadin to Xarelto-- discuss with your new insurance company to see if it will be covered.  Your INR was good today. No changes.  For your itching, it may be an atypical yeast.  I am giving you a cream to use for the next 7 days to see if this helps.  We are checking some labs today, I will send you a letter with the results or call if there is anything abnormal.  Your diabetes is doing better-- your A1c was 7.6%! Try going to [facility] for a new glucose meter.  Come back to see me in about a month so we can see how your leg, itching, and blood pressure are doing.

## 2012-03-18 NOTE — Assessment & Plan Note (Signed)
Xanax 0.5mg  #90 refilled

## 2012-03-18 NOTE — Assessment & Plan Note (Signed)
Vaginal estrogen did not help.  Will try topical terazol to treat for possible candida glabrata which would not respond to diflucan.  If this doesn't work, could try intravaginal boric acid (600 mg capsule once daily at night for two weeks) or intravaginal flucytosine cream (5 g nightly for two weeks)

## 2012-03-19 ENCOUNTER — Encounter: Payer: Self-pay | Admitting: Family Medicine

## 2012-03-19 ENCOUNTER — Ambulatory Visit (HOSPITAL_COMMUNITY)
Admission: RE | Admit: 2012-03-19 | Discharge: 2012-03-19 | Disposition: A | Discharge status: Home / Self Care | Payer: BC Managed Care – PPO | Source: Ambulatory Visit | Attending: Family Medicine | Admitting: Family Medicine

## 2012-03-19 DIAGNOSIS — M79609 Pain in unspecified limb: Secondary | ICD-10-CM

## 2012-03-19 DIAGNOSIS — I2699 Other pulmonary embolism without acute cor pulmonale: Secondary | ICD-10-CM

## 2012-03-19 DIAGNOSIS — C55 Malignant neoplasm of uterus, part unspecified: Secondary | ICD-10-CM

## 2012-03-19 DIAGNOSIS — M7989 Other specified soft tissue disorders: Secondary | ICD-10-CM

## 2012-03-19 DIAGNOSIS — R0989 Other specified symptoms and signs involving the circulatory and respiratory systems: Secondary | ICD-10-CM | POA: Insufficient documentation

## 2012-03-19 DIAGNOSIS — M25569 Pain in unspecified knee: Secondary | ICD-10-CM

## 2012-03-19 DIAGNOSIS — R0609 Other forms of dyspnea: Secondary | ICD-10-CM | POA: Insufficient documentation

## 2012-03-19 DIAGNOSIS — R0600 Dyspnea, unspecified: Secondary | ICD-10-CM

## 2012-03-19 DIAGNOSIS — Z7901 Long term (current) use of anticoagulants: Secondary | ICD-10-CM

## 2012-03-19 NOTE — Progress Notes (Signed)
Right:  No evidence of DVT, superficial thrombosis, or Baker's cyst.  Left:  Negative for DVT in the common femoral vein.  

## 2012-03-20 ENCOUNTER — Telehealth: Payer: Self-pay | Admitting: Family Medicine

## 2012-03-20 NOTE — Telephone Encounter (Signed)
Called pt to inform her of negative LE dopplers.  Also discussed her  Normal TSH and relatively normal BMET (has mild CKD with Cr stable at 1.4).  Pt plans on buying compression stockings tomorrow after pay day.  Will f/u in 1 month.

## 2012-03-27 NOTE — Telephone Encounter (Signed)
Patient's PCP was changed to Dr. Fara Boros.  Gaylene Brooks, RN

## 2012-04-15 ENCOUNTER — Ambulatory Visit (INDEPENDENT_AMBULATORY_CARE_PROVIDER_SITE_OTHER): Payer: BC Managed Care – PPO | Admitting: *Deleted

## 2012-04-15 DIAGNOSIS — I2699 Other pulmonary embolism without acute cor pulmonale: Secondary | ICD-10-CM

## 2012-04-15 DIAGNOSIS — Z7901 Long term (current) use of anticoagulants: Secondary | ICD-10-CM

## 2012-04-15 LAB — POCT INR: INR: 3.3

## 2012-04-29 ENCOUNTER — Ambulatory Visit (INDEPENDENT_AMBULATORY_CARE_PROVIDER_SITE_OTHER): Payer: BC Managed Care – PPO | Admitting: *Deleted

## 2012-04-29 DIAGNOSIS — Z7901 Long term (current) use of anticoagulants: Secondary | ICD-10-CM

## 2012-04-29 DIAGNOSIS — I2699 Other pulmonary embolism without acute cor pulmonale: Secondary | ICD-10-CM

## 2012-04-29 LAB — POCT INR: INR: 2.7

## 2012-05-02 ENCOUNTER — Emergency Department (HOSPITAL_COMMUNITY)
Admission: EM | Admit: 2012-05-02 | Discharge: 2012-05-03 | Disposition: A | Discharge status: Home / Self Care | Payer: BC Managed Care – PPO | Attending: Emergency Medicine | Admitting: Emergency Medicine

## 2012-05-02 ENCOUNTER — Emergency Department (HOSPITAL_COMMUNITY): Payer: BC Managed Care – PPO

## 2012-05-02 ENCOUNTER — Encounter (HOSPITAL_COMMUNITY): Payer: Self-pay | Admitting: Emergency Medicine

## 2012-05-02 ENCOUNTER — Other Ambulatory Visit: Payer: Self-pay

## 2012-05-02 DIAGNOSIS — I10 Essential (primary) hypertension: Secondary | ICD-10-CM | POA: Insufficient documentation

## 2012-05-02 DIAGNOSIS — E079 Disorder of thyroid, unspecified: Secondary | ICD-10-CM | POA: Insufficient documentation

## 2012-05-02 DIAGNOSIS — Z86711 Personal history of pulmonary embolism: Secondary | ICD-10-CM | POA: Insufficient documentation

## 2012-05-02 DIAGNOSIS — Z8542 Personal history of malignant neoplasm of other parts of uterus: Secondary | ICD-10-CM | POA: Insufficient documentation

## 2012-05-02 DIAGNOSIS — F411 Generalized anxiety disorder: Secondary | ICD-10-CM | POA: Insufficient documentation

## 2012-05-02 DIAGNOSIS — R079 Chest pain, unspecified: Secondary | ICD-10-CM | POA: Insufficient documentation

## 2012-05-02 DIAGNOSIS — Z7901 Long term (current) use of anticoagulants: Secondary | ICD-10-CM | POA: Insufficient documentation

## 2012-05-02 DIAGNOSIS — E119 Type 2 diabetes mellitus without complications: Secondary | ICD-10-CM | POA: Insufficient documentation

## 2012-05-02 DIAGNOSIS — E785 Hyperlipidemia, unspecified: Secondary | ICD-10-CM | POA: Insufficient documentation

## 2012-05-02 DIAGNOSIS — Z79899 Other long term (current) drug therapy: Secondary | ICD-10-CM | POA: Insufficient documentation

## 2012-05-02 LAB — BASIC METABOLIC PANEL
BUN: 16 mg/dL (ref 6–23)
GFR calc Af Amer: 63 mL/min — ABNORMAL LOW (ref >90)
GFR calc non Af Amer: 54 mL/min — ABNORMAL LOW (ref >90)
Potassium: 3.8 mEq/L (ref 3.5–5.1)

## 2012-05-02 LAB — CBC
HCT: 39.7 % (ref 36.0–46.0)
MCHC: 33.5 g/dL (ref 30.0–36.0)
Platelets: 207 10*3/uL (ref 150–400)
RDW: 14.1 % (ref 11.5–15.5)

## 2012-05-02 LAB — PROTIME-INR: INR: 2.33 — ABNORMAL HIGH (ref 0.00–1.49)

## 2012-05-02 LAB — PRO B NATRIURETIC PEPTIDE: Pro B Natriuretic peptide (BNP): 97.9 pg/mL (ref 0–125)

## 2012-05-02 MED ORDER — IOHEXOL 350 MG/ML SOLN
80.0000 mL | Freq: Once | INTRAVENOUS | Status: AC | PRN
  Administered 2012-05-02: 80 mL via INTRAVENOUS

## 2012-05-02 MED ORDER — SODIUM CHLORIDE 0.9 % IV BOLUS (SEPSIS)
1000.0000 mL | Freq: Once | INTRAVENOUS | Status: AC
  Administered 2012-05-02: 1000 mL via INTRAVENOUS

## 2012-05-02 NOTE — ED Notes (Signed)
Patient transported to CT 

## 2012-05-02 NOTE — ED Provider Notes (Signed)
History     CSN: [id number]  Arrival date & time 05/02/12  2036   First MD Initiated Contact with Patient 05/02/12 2101      Chief Complaint  Patient presents with  . Chest Pain    (Consider location/radiation/quality/duration/timing/severity/associated sxs/prior treatment) Patient is a 57 y.o. female presenting with chest pain. The history is provided by the patient.  Chest Pain The chest pain began 12 - 24 hours ago. Chest pain occurs intermittently. The chest pain is unchanged. The pain is associated with breathing. The severity of the pain is mild. The quality of the pain is described as burning and pleuritic. The pain radiates to the upper back. Pertinent negatives for primary symptoms include no fever, no fatigue, no shortness of breath, no cough, no wheezing, no palpitations, no abdominal pain, no nausea and no vomiting.  Pertinent negatives for associated symptoms include no lower extremity edema and no weakness. She tried nothing for the symptoms. Risk factors include obesity and sedentary lifestyle.  Her past medical history is significant for PE.     Past Medical History  Diagnosis Date  . Pulmonary embolus   . Uterine cancer   . Thyroid disease   . Anxiety   . Diabetes mellitus   . Hyperlipidemia   . Hypertension     Past Surgical History  Procedure Date  . Thyroid surgery   . Abdominal hysterectomy     TAHBSO    Family History  Problem Relation Age of Onset  . Adopted: Yes    History  Substance Use Topics  . Smoking status: Never Smoker   . Smokeless tobacco: Never Used  . Alcohol Use: No    OB History    Grav Para Term Preterm Abortions TAB SAB Ect Mult Living                  Review of Systems  Constitutional: Negative for fever and fatigue.  HENT: Negative for congestion, rhinorrhea and postnasal drip.   Eyes: Negative for photophobia and visual disturbance.  Respiratory: Negative for cough, chest tightness, shortness of breath and  wheezing.   Cardiovascular: Positive for chest pain. Negative for palpitations and leg swelling.  Gastrointestinal: Negative for nausea, vomiting, abdominal pain and diarrhea.  Genitourinary: Negative for urgency, frequency and difficulty urinating.  Musculoskeletal: Negative for back pain and arthralgias.  Skin: Negative for rash and wound.  Neurological: Negative for weakness and headaches.  Psychiatric/Behavioral: Negative for confusion and agitation.    Allergies  Oxycodone; Simvastatin; and Tramadol hcl  Home Medications   Current Outpatient Rx  Name  Route  Sig  Dispense  Refill  . ALBUTEROL SULFATE HFA 108 (90 BASE) MCG/ACT IN AERS   Inhalation   Inhale 2 puffs into the lungs every 6 (six) hours as needed for wheezing.   18 g   2   . ALPRAZOLAM 0.5 MG PO TABS   Oral   Take 1 tablet (0.5 mg total) by mouth 3 (three) times daily as needed for sleep.   90 tablet   0   . CALCIUM CARBONATE-VITAMIN D 500-200 MG-UNIT PO TABS   Oral   Take 1 tablet by mouth 2 (two) times daily.         Marland Kitchen CETIRIZINE HCL 10 MG PO TABS   Oral   Take 10 mg by mouth daily.           . GLYBURIDE 5 MG PO TABS   Oral   Take 5 mg by mouth  2 (two) times daily with a meal.         . LEVOTHYROXINE SODIUM 125 MCG PO TABS   Oral   Take 1 tablet (125 mcg total) by mouth daily.   90 tablet   1   . LISINOPRIL-HYDROCHLOROTHIAZIDE 20-12.5 MG PO TABS   Oral   Take 1 tablet by mouth daily.         Marland Kitchen METFORMIN HCL 500 MG PO TABS   Oral   Take 1,000 mg by mouth 2 (two) times daily with a meal.         . PRAVASTATIN SODIUM 40 MG PO TABS      TAKE ONE TABLET BY MOUTH AT BEDTIME   90 tablet   3   . VERAPAMIL HCL ER 120 MG PO TBCR   Oral   Take 1 tablet (120 mg total) by mouth at bedtime.   90 tablet   1   . WARFARIN SODIUM 5 MG PO TABS   Oral   Take 5 mg by mouth daily.            BP 110/72  Pulse 105  Temp 97.6 F (36.4 C) (Oral)  Resp 22  SpO2 97%  Physical Exam   Nursing note and vitals reviewed. Constitutional: She is oriented to person, place, and time. She appears well-developed and well-nourished. No distress.  HENT:  Head: Normocephalic and atraumatic.  Mouth/Throat: Oropharynx is clear and moist.  Eyes: EOM are normal. Pupils are equal, round, and reactive to light.  Neck: Normal range of motion. Neck supple.  Cardiovascular: Regular rhythm, normal heart sounds and intact distal pulses.  Tachycardia present.   Pulmonary/Chest: Effort normal and breath sounds normal. She has no wheezes. She has no rales.  Abdominal: Soft. Bowel sounds are normal. She exhibits no distension. There is no tenderness. There is no rebound and no guarding.  Musculoskeletal: Normal range of motion. She exhibits edema and tenderness.       Swelling and erythema to right lower extremity.  Lymphadenopathy:    She has no cervical adenopathy.  Neurological: She is alert and oriented to person, place, and time. She displays normal reflexes. No cranial nerve deficit. She exhibits normal muscle tone. Coordination normal.  Skin: Skin is warm and dry. No rash noted.  Psychiatric: She has a normal mood and affect. Her behavior is normal.    ED Course  Procedures (including critical care time)   Date: 05/02/2012  Rate: 115  Rhythm: sinus tachycardia  QRS Axis: left  Intervals: normal  ST/T Wave abnormalities: nonspecific ST changes  Conduction Disutrbances:none  Narrative Interpretation:   Old EKG Reviewed: none available    Labs Reviewed  BASIC METABOLIC PANEL - Abnormal; Notable for the following:    Chloride 94 (*)     Glucose, Bld 284 (*)     Creatinine, Ser 1.11 (*)     GFR calc non Af Amer 54 (*)     GFR calc Af Amer 63 (*)     All other components within normal limits  PROTIME-INR - Abnormal; Notable for the following:    Prothrombin Time 24.5 (*)     INR 2.33 (*)     All other components within normal limits  CBC  PRO B NATRIURETIC PEPTIDE  POCT  I-STAT TROPONIN I   Ct Angio Chest Pe W/cm &/or Wo Cm  05/03/2012  *RADIOLOGY REPORT*  Clinical Data: Chest pain and shortness of breath.  Cough.  CT ANGIOGRAPHY CHEST  Technique:  Multidetector CT imaging of the chest using the standard protocol during bolus administration of intravenous contrast. Multiplanar reconstructed images including MIPs were obtained and reviewed to evaluate the vascular anatomy.  Contrast: 115 mL OMNIPAQUE IOHEXOL 350 MG/ML SOLN.  The patient required re-injection due to limitations of intravenous catheter initially.  Comparison: 01/21/2010  Findings: Technically adequate study with moderately good opacification of the central and segmental pulmonary arteries.  No focal filling defects are demonstrated.  No evidence of significant pulmonary embolus.  Peripheral vessels are not well demonstrated.  Normal heart size.  Normal caliber thoracic aorta.  No significant lymphadenopathy in the chest.  The esophagus is decompressed. Incidental note of mild diffuse fatty infiltration of the liver.  No pleural effusions.  Mild atelectasis or fibrosis in the lung bases. There is no focal airspace consolidation.  No interstitial or airspace disease diffusely.  Airways appear patent.  No pneumothorax.  Azygos lobe.  Degenerative changes in the thoracic spine.  IMPRESSION: No evidence of significant pulmonary embolus.  No evidence of active lung disease.   Original Report Authenticated By: Burman Nieves, M.D.    Dg Chest Port 1 View  05/02/2012  *RADIOLOGY REPORT*  Clinical Data: Chest pain.  Hemoptysis.  CHEST - 1 VIEW  Comparison:  None.  Findings: The heart size and mediastinal contours are within normal limits.  Both lungs are clear.  Azygos fissure incidentally noted. Surgical clips again seen at the level of thoracic inlet.  IMPRESSION:  Stable exam.  No active disease.   Original Report Authenticated By: Myles Rosenthal, M.D.      1. Chest pain       MDM  33F with pmhx of PE on  Coumadin, DM, HTN here with chest pain today. Feels similar to pain she felt when diagnosed with PE about 4 years ago. Does not report any recent subtherapeutic INR levels. Has had some nasal congestion and bleeding from nose, which she seems to be spitting up. Does not sound like true hemoptysis. Pt is concerned she has another PE. INR today is 2.3 Creatinine 1.11. Will obtain CTA chest to r/o PE. Doubt ACS or dissection. Does have right lower extremity swelling and pain but has been the same to 2 months and reports a negative ultrasound about 1 month ago. If CT negative, would be stable for d/c home.  CTA chest negative for PE. Trop negative. Doubt ACS or dissection. Felt stable for d/c home. Return precautions given. F/u with pcp in 2-3 days.        Johnnette Gourd, MD 05/03/12 [phone]

## 2012-05-02 NOTE — ED Notes (Signed)
C/o aching and burning pain across chest all day with sob and "spitting up blood."  Pt denies cough.  States bright red blood is "draining" out of mouth.  Pt takes Coumadin.

## 2012-05-03 NOTE — ED Provider Notes (Signed)
Medical screening examination/treatment/procedure(s) were conducted as a shared visit with non-physician practitioner(s) and myself.  I personally evaluated the patient during the encounter.  Patient workup with feelings of shortness of breath that she thought were similar to when she had PE. Initially she said there was some hemoptysis, patient has some upper respiratory type symptoms it is felt that she is having some bloody drainage from the sinuses. Her CT was negative. Patient was discharged to home.  Gilda Crease, MD 05/03/12 [phone]

## 2012-05-07 ENCOUNTER — Encounter: Payer: Self-pay | Admitting: Family Medicine

## 2012-05-07 ENCOUNTER — Ambulatory Visit (INDEPENDENT_AMBULATORY_CARE_PROVIDER_SITE_OTHER): Payer: BC Managed Care – PPO | Admitting: Family Medicine

## 2012-05-07 VITALS — BP 126/80 | HR 105 | Temp 98.8°F | Ht 66.0 in | Wt 289.0 lb

## 2012-05-07 DIAGNOSIS — J329 Chronic sinusitis, unspecified: Secondary | ICD-10-CM | POA: Insufficient documentation

## 2012-05-07 DIAGNOSIS — M7989 Other specified soft tissue disorders: Secondary | ICD-10-CM

## 2012-05-07 MED ORDER — AMOXICILLIN-POT CLAVULANATE 875-125 MG PO TABS: 1.0000 | ORAL_TABLET | Freq: Two times a day (BID) | ORAL | Status: DC

## 2012-05-07 MED ORDER — MONTELUKAST SODIUM 10 MG PO TABS: 10.0000 mg | ORAL_TABLET | Freq: Every day | ORAL | Status: DC

## 2012-05-07 NOTE — Patient Instructions (Signed)
It was good to see you today.  We will go ahead and treat you in case this is turning into a sinus infection. Other things you can do to try to help make your nose feel better are saline rinses to get some of the mucus out or saline spray to help keep the inside of your nose moisturized (it's probably hurting because it is dry). If you have a humidifier, use it at bed time, it will help keep the air moist as well.  I also refilled your singulair.  Try to get some over the counter compression stockings and keep your legs elevated!  Come back to see me in 3-4 weeks.

## 2012-05-07 NOTE — Progress Notes (Signed)
S: Pt comes in today for follow up ER visit- was seen on 05/02/12 for hemoptysis, PE was ruled out with negative CTA.  Still with some chest achiness and coughing.  + sinus drainage.  Gagging from the drainage.  Sinus drainage has been getting worse for the past week.  Some blood when she blows her nose.  + sinus pressure and frontal sinus pain.  Feels raw inside of her nose.  Has never had issues with nose bleeds before-- has had a little bleeding with colds before.  No fevers/chills.   Has not tried any OTC medicines.    INR was 2.33 on 05/02/12.     ROS: Per HPI  History  Smoking status  . Never Smoker   Smokeless tobacco  . Never Used    O:  Filed Vitals:   05/07/12 0956  BP: 126/80  Pulse: 105  Temp: 98.8 F (37.1 C)    Gen: NAD HEENT: MMM, no pharyngeal erythema or exudate, no cervical LAD, nasal mucosa appears normal- no blood or scabbing seen  CV: RRR, no murmur Pulm: CTA bilat, no wheezes or crackles Ext: Warm,  Legs appear tight but no pitting edema; small area of redness over lateral right shin- appears more c/w skin breakdown from chronic edema rather than acute cellulitis    A/P: 56 y.o. female p/w URI, hemoptysis 2/2 likely nasal irritation -See problem list -f/u in 3-4 weeks for HTN, DM, LE edema recheck

## 2012-05-07 NOTE — Assessment & Plan Note (Signed)
Nasal dryness + coumadin is likely reason for mild nasal bleeding.  Discussed with pt that sinus symptoms are likely viral, but she would really like abx rather than waiting until next Monday, so Augmentin Rx'ed.  Also advised humidifier as well as nasal saline to help with moisture.  PE was ruled out in ER last week.

## 2012-05-07 NOTE — Assessment & Plan Note (Signed)
Has not been using ACE wrap or compression stockings- advised her to get some OTC ones until she is able to fit into her custom ones (legs are too swollen at this time).  No obvious cellulitis but red flags for infection and return discussed with patient.    Still would need to consider pelvic imaging if swelling continues to worsen without clear cause since pt has h/o uterine adenocarcinoma s/p hysterectomy in 2011.

## 2012-05-18 ENCOUNTER — Other Ambulatory Visit: Payer: Self-pay | Admitting: Family Medicine

## 2012-05-21 ENCOUNTER — Other Ambulatory Visit: Payer: Self-pay | Admitting: Family Medicine

## 2012-05-27 ENCOUNTER — Ambulatory Visit (INDEPENDENT_AMBULATORY_CARE_PROVIDER_SITE_OTHER): Payer: BC Managed Care – PPO | Admitting: *Deleted

## 2012-05-27 DIAGNOSIS — Z7901 Long term (current) use of anticoagulants: Secondary | ICD-10-CM

## 2012-05-27 DIAGNOSIS — I2699 Other pulmonary embolism without acute cor pulmonale: Secondary | ICD-10-CM

## 2012-06-09 ENCOUNTER — Other Ambulatory Visit: Payer: Self-pay | Admitting: Family Medicine

## 2012-06-10 ENCOUNTER — Encounter: Payer: Self-pay | Admitting: Family Medicine

## 2012-06-10 ENCOUNTER — Other Ambulatory Visit: Payer: Self-pay | Admitting: Family Medicine

## 2012-06-10 DIAGNOSIS — F411 Generalized anxiety disorder: Secondary | ICD-10-CM

## 2012-06-10 MED ORDER — ALPRAZOLAM 0.5 MG PO TABS: 0.5000 mg | ORAL_TABLET | Freq: Three times a day (TID) | ORAL | Status: DC | PRN

## 2012-06-10 NOTE — Telephone Encounter (Signed)
Refill request from pharmacy. Refilled Xanax 0.5mg  #90. Last discussed w/ pt 03/18/12.

## 2012-06-24 ENCOUNTER — Ambulatory Visit (INDEPENDENT_AMBULATORY_CARE_PROVIDER_SITE_OTHER): Payer: BC Managed Care – PPO | Admitting: *Deleted

## 2012-06-24 DIAGNOSIS — I2699 Other pulmonary embolism without acute cor pulmonale: Secondary | ICD-10-CM

## 2012-06-24 DIAGNOSIS — Z7901 Long term (current) use of anticoagulants: Secondary | ICD-10-CM

## 2012-06-24 LAB — POCT INR: INR: 2.7

## 2012-07-07 ENCOUNTER — Encounter: Payer: Self-pay | Admitting: Home Health Services

## 2012-07-07 DIAGNOSIS — E1165 Type 2 diabetes mellitus with hyperglycemia: Secondary | ICD-10-CM

## 2012-07-07 NOTE — Assessment & Plan Note (Deleted)
Results were negative for dm retinopathy.  Follow up photographs in 12 months.  

## 2012-07-07 NOTE — Progress Notes (Signed)
Spoke with Allison [name].   Informed her that her results were negative for dm retinopathy.  Follow up photographs in 12 months. Pt expressed understanding.

## 2012-07-22 ENCOUNTER — Ambulatory Visit (INDEPENDENT_AMBULATORY_CARE_PROVIDER_SITE_OTHER): Payer: BC Managed Care – PPO | Admitting: *Deleted

## 2012-07-22 DIAGNOSIS — Z7901 Long term (current) use of anticoagulants: Secondary | ICD-10-CM

## 2012-07-22 DIAGNOSIS — I2699 Other pulmonary embolism without acute cor pulmonale: Secondary | ICD-10-CM

## 2012-07-29 ENCOUNTER — Ambulatory Visit: Payer: BC Managed Care – PPO

## 2012-08-26 ENCOUNTER — Ambulatory Visit (INDEPENDENT_AMBULATORY_CARE_PROVIDER_SITE_OTHER): Payer: BC Managed Care – PPO | Admitting: *Deleted

## 2012-08-26 DIAGNOSIS — I2699 Other pulmonary embolism without acute cor pulmonale: Secondary | ICD-10-CM

## 2012-08-26 DIAGNOSIS — Z7901 Long term (current) use of anticoagulants: Secondary | ICD-10-CM

## 2012-09-01 ENCOUNTER — Other Ambulatory Visit: Payer: Self-pay | Admitting: Family Medicine

## 2012-09-01 DIAGNOSIS — F411 Generalized anxiety disorder: Secondary | ICD-10-CM

## 2012-09-01 MED ORDER — ALPRAZOLAM 0.5 MG PO TABS: 0.5000 mg | ORAL_TABLET | Freq: Three times a day (TID) | ORAL | Status: DC | PRN

## 2012-09-02 ENCOUNTER — Encounter: Payer: Self-pay | Admitting: Family Medicine

## 2012-09-02 ENCOUNTER — Ambulatory Visit (INDEPENDENT_AMBULATORY_CARE_PROVIDER_SITE_OTHER): Payer: BC Managed Care – PPO | Admitting: Family Medicine

## 2012-09-02 VITALS — BP 114/74 | HR 102 | Temp 100.3°F | Ht 66.0 in | Wt 283.0 lb

## 2012-09-02 DIAGNOSIS — J329 Chronic sinusitis, unspecified: Secondary | ICD-10-CM

## 2012-09-02 MED ORDER — BENZONATATE 200 MG PO CAPS: 200.0000 mg | ORAL_CAPSULE | Freq: Two times a day (BID) | ORAL | Status: DC | PRN

## 2012-09-02 MED ORDER — FLUTICASONE PROPIONATE 50 MCG/ACT NA SUSP: 2.0000 | Freq: Every day | NASAL | Status: DC

## 2012-09-02 MED ORDER — AMOXICILLIN-POT CLAVULANATE 875-125 MG PO TABS: 1.0000 | ORAL_TABLET | Freq: Two times a day (BID) | ORAL | Status: DC

## 2012-09-02 NOTE — Progress Notes (Signed)
S: Pt comes in today for SDA for cough.  Has been having sinus drainage and pain around her right eye for the past few weeks.  Has had a sore throat for 2 weeks and then cough started also about 2 weeks.  Has been running a temp of 101 at night.  Still feels congestion, no rhinorrhea but can feel drainage down her throat.  Ribs and sides are sore from coughing.  Taking Zyrtec for her allergies, no nose spray or eye drops.  Hasn't been able to sleep because coughing is worse at night.  +sputum production but no blood.  Right eye has been getting matted for the last few days and turned red; feels like it's a little swollen-- is not itchy or painful, no foreign body sensation or scratchiness.  No SOB.  No N/V/D.     ROS: Per HPI  History  Smoking status  . Never Smoker   Smokeless tobacco  . Never Used    O:  Filed Vitals:   09/02/12 1403  BP: 114/74  Pulse: 102  Temp: 100.3 F (37.9 C)    Gen: NAD HEENT: MMM, EOMI, PERRLA, no scleral injection on left, mild injection on right, mild pharyngeal erythema w/ ?cobblestoning, no exudate, nasal mucosa normal w/o rhinorrhea, no cervical LAD, TTP over right frontal and maxillary sinus with no TTP over left CV: RRR, no murmur Pulm: CTA bilat, no increased WOB, no wheezes or crackles, good breath sounds throughout   A/P: 57 y.o. female p/w URI +/- allergies --> sinus infection -See problem list -f/u in PRN

## 2012-09-02 NOTE — Assessment & Plan Note (Signed)
Likely started as combination of allergies and URI, now with 2 weeks of sinus pressure and fevers, likely from bacterial sinusitis.  Will treat with augmentin.  Tessalon for cough and flonase for any allergic component to take with her Zyrtec.  F/u if not improving within 5-7 days.

## 2012-09-02 NOTE — Patient Instructions (Signed)
It was good to see you today.  I'm sorry you're feeling so badly!  I think your fever and sinus pain is from a sinus infection-- I sent in augmentin to help clear that up.  I also sent in a medicine to help with the cough as well as a nose spray in case some of this is being caused by allergies.  I think most of this started from a virus but now you have a bacterial sinus infection.   Come back if you aren't starting to feel better by early next week.

## 2012-09-14 ENCOUNTER — Other Ambulatory Visit: Payer: Self-pay | Admitting: Family Medicine

## 2012-09-22 ENCOUNTER — Other Ambulatory Visit: Payer: Self-pay | Admitting: Family Medicine

## 2012-09-22 ENCOUNTER — Other Ambulatory Visit: Payer: Self-pay | Admitting: *Deleted

## 2012-09-22 MED ORDER — WARFARIN SODIUM 5 MG PO TABS: 5.0000 mg | ORAL_TABLET | Freq: Every day | ORAL | Status: DC

## 2012-09-30 ENCOUNTER — Other Ambulatory Visit: Payer: Self-pay | Admitting: Family Medicine

## 2012-09-30 ENCOUNTER — Ambulatory Visit (INDEPENDENT_AMBULATORY_CARE_PROVIDER_SITE_OTHER): Payer: BC Managed Care – PPO | Admitting: *Deleted

## 2012-09-30 DIAGNOSIS — Z7901 Long term (current) use of anticoagulants: Secondary | ICD-10-CM

## 2012-09-30 DIAGNOSIS — I2699 Other pulmonary embolism without acute cor pulmonale: Secondary | ICD-10-CM

## 2012-09-30 LAB — POCT INR: INR: 3

## 2012-09-30 MED ORDER — FLUCONAZOLE 150 MG PO TABS: ORAL_TABLET | ORAL | Status: DC

## 2012-09-30 NOTE — Telephone Encounter (Signed)
Will also adjust coumadin dose temporarily.

## 2012-09-30 NOTE — Progress Notes (Signed)
Pt was on Augmentin for 10 days; now has yeast infection.  This is normally what happens to her following antibiotics.  Discussed with Dr. Leveda Anna that pt request diflucan 2 doses.  He wrote prescription and dosed warfarin based on previous times pt has experienced this issue.  Pt to reduce warfarin to 2.5 mg for 6 days, then resume 5 mg daily and recheck at the end of next week. Dewitt Hoes, MLS

## 2012-10-09 ENCOUNTER — Other Ambulatory Visit: Payer: Self-pay

## 2012-10-10 ENCOUNTER — Ambulatory Visit (INDEPENDENT_AMBULATORY_CARE_PROVIDER_SITE_OTHER): Payer: BC Managed Care – PPO | Admitting: Family Medicine

## 2012-10-10 ENCOUNTER — Ambulatory Visit (INDEPENDENT_AMBULATORY_CARE_PROVIDER_SITE_OTHER): Payer: BC Managed Care – PPO | Admitting: *Deleted

## 2012-10-10 ENCOUNTER — Encounter: Payer: Self-pay | Admitting: Family Medicine

## 2012-10-10 VITALS — BP 160/96 | HR 113 | Temp 98.2°F | Ht 66.0 in | Wt 277.0 lb

## 2012-10-10 DIAGNOSIS — I2699 Other pulmonary embolism without acute cor pulmonale: Secondary | ICD-10-CM

## 2012-10-10 DIAGNOSIS — R319 Hematuria, unspecified: Secondary | ICD-10-CM

## 2012-10-10 DIAGNOSIS — Z7901 Long term (current) use of anticoagulants: Secondary | ICD-10-CM

## 2012-10-10 LAB — BASIC METABOLIC PANEL
BUN: 17 mg/dL (ref 6–23)
CO2: 27 mEq/L (ref 19–32)
Chloride: 98 mEq/L (ref 96–112)
Creat: 1.32 mg/dL — ABNORMAL HIGH (ref 0.50–1.10)
Glucose, Bld: 161 mg/dL — ABNORMAL HIGH (ref 70–99)
Potassium: 4.2 mEq/L (ref 3.5–5.3)

## 2012-10-10 LAB — POCT URINALYSIS DIPSTICK
Bilirubin, UA: NEGATIVE
Nitrite, UA: NEGATIVE
Spec Grav, UA: >=1.03
pH, UA: 5.5

## 2012-10-10 NOTE — Assessment & Plan Note (Signed)
Gross hematuria confirmed on UA in office today; no other findings that are indicative of UTI.   She was only able to give a small volume of urine (too small for UCx).  Given painless nature, I am most suspicious of nonobstructive renal calculus that is causing the bleeding (on warfarin with therapeutic INR).  She has had some chronic renal insufficiency in the past (prior Cr in the 1.4-range, with most recent 1.1 in Jan 2014).  WIll recheck BMet today; also to check non-contrast helical CT to evaluate for stones.  I am asking her to follow up with her new primary physician for followup after the CT.  Will need referral for cystoscopy by urology for evaluation of possible lower GU tract sources of bleeding.

## 2012-10-10 NOTE — Assessment & Plan Note (Signed)
Patient reports that she has been diagnosed one time with PE, has never had a recurrence of either PE or DVT.  If this is indeed the case (and if no identified coagulopathy), there may not be an indication for indefinite anticoagulation.  I began to discuss this with the patient in today's SDA visit; can be addressed with more detail at future visit with primary physician.  For now, we are continuing her warfarin.

## 2012-10-10 NOTE — Progress Notes (Signed)
  Subjective:    Patient ID: Allison [name], female    DOB: 11-Oct-1955, 57 y.o.   MRN: [id number]  HPI Patient seen as a SDA for complaint of painless gross hematuria that she began noticing a couple of days ago.  Denies fevers/chills, no polyuria or dysuria. No abdominal pain, has had longstanding loose stools which she attributes to medications.  She has had a history of renal stones in the past, has a sandwich baggie in her pocketbook with a collection of small brown stones that she's passed.  Says they are usually painful when they pass.  She says she's been evaluated by urology in the past but is unsure if she's undergone cystoscopy (does recall prior imaging for stones).   She reports being evaluated multiple times for UTIs; in the EMR I see several urine cultures which report 'no growth'.  She was recently treated with Diflucan for vaginal candidiasis which she reports has resolved.  She is s/p total hysterectomy for endometrial cancer (april 2012).   Patient is on warfarin for pulmonary emboli in 2010; she is only aware of one episode of VTE (not recurrent episodes).   Review of SystemsSee HPI.      Objective:   Physical Exam Well appearing, no apparent distress.  HEENT Neck supple. Moist mucus membranes. No cervical adenopathy.  COR Regular S1S2, no extra sounds.  PULM Clear bilaterally, no rales or wheezes.  ABD Obese, soft, hypoactive bowel sounds. No masses or megaly.  Nontender to deep palpation throughout.  No CVA tenderness or suprapubic tenderness.        Assessment & Plan:

## 2012-10-10 NOTE — Patient Instructions (Addendum)
It was a pleasure to see you today. I am ordering a CT to look for sources of the bleeding.  Your urinalysis does not indicate an infection today.   I would also like for you to come back to our office after the CT is done.  We will refer you to a urologist when the CT is back, for evaluation of the lower urinary tract (with cystoscopy).  If you develop pain, fevers/chills, nausea/vomiting, then I would ask that you seek medical attention while on vacation and begin to strain your urine.

## 2012-10-11 LAB — URINE CULTURE

## 2012-10-14 ENCOUNTER — Ambulatory Visit: Payer: BC Managed Care – PPO | Admitting: Family Medicine

## 2012-10-16 ENCOUNTER — Encounter: Payer: Self-pay | Admitting: Family Medicine

## 2012-10-19 ENCOUNTER — Other Ambulatory Visit: Payer: Self-pay | Admitting: Family Medicine

## 2012-10-20 ENCOUNTER — Ambulatory Visit (HOSPITAL_COMMUNITY)
Admission: RE | Admit: 2012-10-20 | Discharge: 2012-10-20 | Disposition: A | Discharge status: Home / Self Care | Payer: BC Managed Care – PPO | Source: Ambulatory Visit | Attending: Family Medicine | Admitting: Family Medicine

## 2012-10-20 ENCOUNTER — Telehealth: Payer: Self-pay | Admitting: Family Medicine

## 2012-10-20 DIAGNOSIS — R319 Hematuria, unspecified: Secondary | ICD-10-CM | POA: Insufficient documentation

## 2012-10-20 DIAGNOSIS — M549 Dorsalgia, unspecified: Secondary | ICD-10-CM | POA: Insufficient documentation

## 2012-10-20 DIAGNOSIS — K8020 Calculus of gallbladder without cholecystitis without obstruction: Secondary | ICD-10-CM | POA: Insufficient documentation

## 2012-10-20 DIAGNOSIS — N201 Calculus of ureter: Secondary | ICD-10-CM | POA: Insufficient documentation

## 2012-10-20 DIAGNOSIS — N200 Calculus of kidney: Secondary | ICD-10-CM | POA: Insufficient documentation

## 2012-10-20 MED ORDER — TAMSULOSIN HCL 0.4 MG PO CAPS: 0.4000 mg | ORAL_CAPSULE | Freq: Every day | ORAL | Status: DC

## 2012-10-20 NOTE — Telephone Encounter (Signed)
Called patient re. CT non-contrast for nephrolithiasis, shows bilat kidney stones and moderate hydronephrosis on the left.  Patietn reports that she continues with mild lower back pain, not localized to either side.  She passed a stone last night and another this morning after the CT scan was done.  I reviewed her lab work from her recent visit on 7/11, her serum Cr is in the range of her baseline over the past year.  I have asked her to make an appointment with her new primary physician in the coming week, or to come in if she experiences intractable pain, fevers, recurrent gross hematuria or other concerns.  Will start on Flomax 0.4mg  one time daily until her return visit.  The stone in the distal L ureter was estimated to measure approximately 0.4cm, which she should be able to pass spontaneously.  She is straining urine and saving stones.  JB

## 2012-10-31 ENCOUNTER — Ambulatory Visit (INDEPENDENT_AMBULATORY_CARE_PROVIDER_SITE_OTHER): Payer: BC Managed Care – PPO | Admitting: Family Medicine

## 2012-10-31 ENCOUNTER — Encounter: Payer: Self-pay | Admitting: Family Medicine

## 2012-10-31 VITALS — BP 133/80 | HR 92 | Temp 99.1°F | Ht 66.0 in | Wt 287.0 lb

## 2012-10-31 DIAGNOSIS — I2699 Other pulmonary embolism without acute cor pulmonale: Secondary | ICD-10-CM

## 2012-10-31 DIAGNOSIS — E1165 Type 2 diabetes mellitus with hyperglycemia: Secondary | ICD-10-CM

## 2012-10-31 DIAGNOSIS — R319 Hematuria, unspecified: Secondary | ICD-10-CM

## 2012-10-31 DIAGNOSIS — L02419 Cutaneous abscess of limb, unspecified: Secondary | ICD-10-CM

## 2012-10-31 DIAGNOSIS — L03119 Cellulitis of unspecified part of limb: Secondary | ICD-10-CM

## 2012-10-31 DIAGNOSIS — L03115 Cellulitis of right lower limb: Secondary | ICD-10-CM | POA: Insufficient documentation

## 2012-10-31 DIAGNOSIS — Z7901 Long term (current) use of anticoagulants: Secondary | ICD-10-CM

## 2012-10-31 DIAGNOSIS — E118 Type 2 diabetes mellitus with unspecified complications: Secondary | ICD-10-CM

## 2012-10-31 LAB — POCT INR: INR: 2.6

## 2012-10-31 LAB — POCT UA - MICROSCOPIC ONLY

## 2012-10-31 LAB — POCT URINALYSIS DIPSTICK
Bilirubin, UA: NEGATIVE
Glucose, UA: NEGATIVE
Ketones, UA: NEGATIVE
Nitrite, UA: NEGATIVE
pH, UA: 6

## 2012-10-31 MED ORDER — CLINDAMYCIN HCL 300 MG PO CAPS: 300.0000 mg | ORAL_CAPSULE | Freq: Three times a day (TID) | ORAL | Status: DC

## 2012-10-31 NOTE — Assessment & Plan Note (Signed)
A1C stable at 7.5 despite dietary indiscretion Continue glyburide and metformin Counseled about lifestyle changes, patient confident that she can get back on track now that she is home from vacation.

## 2012-10-31 NOTE — Assessment & Plan Note (Signed)
CT evidence of renal stones, patient reports passing after imaging. No more hematuria since tamsulosin. Will defer cystoscopy as explanation found and symptoms improved with passing stones, If further hematuria will consider uro referral.

## 2012-10-31 NOTE — Progress Notes (Signed)
  Subjective:    Patient ID: Allison [name], female    DOB: 10-13-1955, 57 y.o.   MRN: [id number]  HPI Pt reports that about a week ago she put a bandage over a small scab on her shin and when she pealed it off the skin came off too. It has become increasingly red and painful since that time. She has also noticed more swelling than usual around the area. She denies fever, chills, chest pain or shortness of breath.  In addition we discussed her diabetes control. She reports that she has been less compliant with her diet lately because of summer vacation. She has had no readings or symptoms of hypoglycemia.  Review of Systems See HPI    Objective:   Physical Exam  Vitals reviewed. Constitutional: She appears well-developed and well-nourished. No distress.  Eyes: Conjunctivae are normal.  Pulmonary/Chest: Effort normal.  Musculoskeletal: Normal range of motion.       Legs: Skin: She is not diaphoretic.          Assessment & Plan:

## 2012-10-31 NOTE — Patient Instructions (Addendum)
Today we discussed your diabetes, the sore on your leg and the blood in your urine.  For the blood, your urine today is still pending and with the stones you have passed and the improvement in your symptoms since passing them, it is very unlikely that anything else is going on to cause bleeding. If this happens again we will consider a referral to a urologist to get an image of your bladder. If today's urinalysis tells me something different then I will call you.  For the sore, I am prescribing antibiotics for the next week and I would like to see you back after that to reevaluate the ulcer.   Your diabetes control is still quite good with an A1C of 7.5. It will get even better when you get back on your diet and increase your activity once this leg pain resolves.  Thank you and it was nice meeting you today.  Dr. Richarda Blade

## 2012-10-31 NOTE — Assessment & Plan Note (Addendum)
Will start clindamycin for broad coverage in diabetic patient F/u in 1 week Elevate and wrap tightly w/ ace bandage as she reports compressions stocking too expensive

## 2012-11-07 ENCOUNTER — Encounter: Payer: Self-pay | Admitting: Family Medicine

## 2012-11-07 ENCOUNTER — Ambulatory Visit (INDEPENDENT_AMBULATORY_CARE_PROVIDER_SITE_OTHER): Payer: BC Managed Care – PPO | Admitting: Family Medicine

## 2012-11-07 VITALS — BP 122/80 | HR 92 | Temp 98.5°F | Wt 280.0 lb

## 2012-11-07 DIAGNOSIS — N200 Calculus of kidney: Secondary | ICD-10-CM

## 2012-11-07 DIAGNOSIS — L02419 Cutaneous abscess of limb, unspecified: Secondary | ICD-10-CM

## 2012-11-07 DIAGNOSIS — L03119 Cellulitis of unspecified part of limb: Secondary | ICD-10-CM

## 2012-11-07 DIAGNOSIS — L03115 Cellulitis of right lower limb: Secondary | ICD-10-CM

## 2012-11-07 MED ORDER — MUPIROCIN 2 % EX OINT: TOPICAL_OINTMENT | Freq: Three times a day (TID) | CUTANEOUS | Status: DC

## 2012-11-07 NOTE — Assessment & Plan Note (Signed)
Much improved soft tissue infection of the right leg; has brisk capillary refill and palpable dp pulses bilaterally. The presence of alopecia and her report of recurrent soft tissue infections that are slow to heal, raises concern for arterial insufficiency.  For ABI with our [facility]acy [facility].

## 2012-11-07 NOTE — Assessment & Plan Note (Signed)
Patient with recurrent nephrolithiasis and recent passage of kidney stone.  She has continued to have intermittent hematuria.  She denies back pain or hematuria presently.  Given the frequency of recurrence and the presence of CKD, would consider return to urology for consult, as she will likely continue to pass stones.  The CT imaging was done prior to her report of passing a stone; I suspect the hydronephrosis reported on the CT report is resolved following the stone passage. JB

## 2012-11-07 NOTE — Progress Notes (Signed)
Subjective:     Patient ID: Allison [name], female   DOB: Sep 09, 1955, 57 y.o.   MRN: [id number]  HPI This is a 57 yo woman w/ hx of kidney stones (10/10/2012)  and cellulitis of the right leg (10/31/2012) in clinic today for f/u.  Cellulitis Pt started on clindamycin last Friday and has 4 more doses to take. States that she is feeling better w/ pain improving from "shock-type pain" w/ assoc drainage to now a minor soreness. She has not used any topicals for the area and notes that she is allergic to Neosporin (last use caused a rash that resulted in skin infxn). She denies any new lesions on her skin, erythema, or additional swelling at the site of her cellulitis.  Kidney stones Overall, she has been doing well since initial work-up on 10/10/2012. CT on 10/20/2012: "Moderate left hydronephrosis secondary to a faint stone or stone debris in the distal left ureter. Difficult to definitively  exclude an obstructing lesion. Bilateral nephrolithiasis." Previous f/u on 10/31/2012 revealed "no more hematuria since tamsulosin." Since then she has had only one episode of blood in urine. She reports inc urinary freq assoc w/ starting clindamycin. She denies passing stones, dysuria and any back pain.   She has seen a urologist several years ago in the past w/ no significant findings.  Review of Systems General: No fevers, chills, sweats, N/V.  CV/Pulm: No chest pain or SOB.  GI: No diarrhea, change in BM freq, or color.  Extremities: Some tingling in feet. Reports swelling in R>L leg. Pt reports that she gets areas of erythema/irritation on her leg recurrently R>L.  Rest of ROS as per HPI  Attending Note: Patient seen and examined by me in conjunction with Donnal Moat, [facility] MS3, I have reviewed his note and agree with his documentation.  Allison [name] is here today for follow up after being started on Clindamycin 7 days ago for soft tissue infection of the right shin.  Had been applying adhesive bandage to  the area, which worsened when she removed the bandage.  Has noticed marked improvement in the past 1 week, has one more day of clindamycin to take.  No fevers or chills, no nausea/diarrhea.  Of note, she reports that she gets small soft tissue infections quite frequently on the right leg; typically breaks out when she uses neosporin.    She does not shave her legs.   She has not had any recurrence of the back pain that she had with previous diagnosis of kidney stone.  Has seen frank blood/hematuria intermittently. JB     Objective:   Physical Exam BP 122/80  Pulse 92  Temp(Src) 98.5 F (36.9 C) (Oral)  Wt 280 lb (127.007 kg)  BMI 45.21 kg/m2  General: NAD. A&Ox3. Speaks in full sentences and answers questions appropriately. Morbidly obese.  CV: RRR. Normal S1 and S2. No murmurs, rubs, or gallops. Radial and dorsalis pedis pulses 2+ bilaterally. No peripheral edema.   Pulm: All lung fields clear and equal to auscultation bilaterally.  Back: No CVA tenderness.  Extrem: Left leg - no lesions, erythema, rashes, ulcerations or sores. Right leg - Discoid superficial ulcer scabbed over approx 3'' in length and 2'' in height, with minor surrounding erythema that appears patchy and extends to middle 2/3 of anterior leg. No drainage. No increased tenderness to palpation of surrounding erythema. No warmth noted. Foot exam revealed no new ulcers, calluses, erythema or swelling, and normal nonfilament sensation in all parts of foot  bilaterally.  Exam: Well appearing, no apparent distress HEENT Neck supple.  LEs; alopecia over both legs.  2x3cm eschar along anterior R shin, dry and with minimal surrounding erythema. No fluctuance at site of eschar.  Palpable dp pulses bilat.  Monofilament sensory testing performed this visit and without defects. JB  Problem List: 1. Cellulitis 2. Kidney stones    Assessment:     This is a 57 yo woman w/ hx of kidney stones and cellulitis of the right leg  doing well on follow up.     Plan:     1.  Cellulitis - Pt appears to be healing well and should finish course of clindamycin. Tingling, recurrence of erythema and irritation and swelling largely in right leg, and most recent cellulitis is concerning for circulation problem. Pt will be advised to make an appointment w/ Pharmacy Clinic to see Dr. Raymondo Band for ABI, then f/u w/ PCP, Dr. Richarda Blade afterwards. Additionally, to hopefully prevent recurrence of cellulitis/ulceration, pt will be prescribed mupirocin cream to apply 2-3x qday to areas of erythema/wounds that crop up on her legs in the future.  2.  Kidney stones - Pt doing well and no treatment necessary at this time. If bleeding worsens or RBCs found on repeat UA, may consider consult to urology.

## 2012-11-07 NOTE — Patient Instructions (Addendum)
It was a pleasure to see you today; the right leg appears to be healing nicely.   I sent a prescription for mupirocin ointment, apply 2 to 3 times daily to wounds as they arise in your legs.  Please see [place] promptly if they enlarge or do not improve.  I ask that you schedule an appointment with our Pharmacy Clinic, for a test of your blood flow called an "ABI".  Follow up with Dr Richarda Blade after this is done.

## 2012-11-13 ENCOUNTER — Ambulatory Visit (INDEPENDENT_AMBULATORY_CARE_PROVIDER_SITE_OTHER): Payer: BC Managed Care – PPO | Admitting: Pharmacist

## 2012-11-13 ENCOUNTER — Ambulatory Visit (INDEPENDENT_AMBULATORY_CARE_PROVIDER_SITE_OTHER): Payer: BC Managed Care – PPO | Admitting: *Deleted

## 2012-11-13 ENCOUNTER — Encounter: Payer: Self-pay | Admitting: Pharmacist

## 2012-11-13 VITALS — BP 112/73 | HR 107 | Ht 65.5 in | Wt 278.8 lb

## 2012-11-13 DIAGNOSIS — L03115 Cellulitis of right lower limb: Secondary | ICD-10-CM

## 2012-11-13 DIAGNOSIS — Z7901 Long term (current) use of anticoagulants: Secondary | ICD-10-CM

## 2012-11-13 DIAGNOSIS — I2699 Other pulmonary embolism without acute cor pulmonale: Secondary | ICD-10-CM

## 2012-11-13 DIAGNOSIS — L02419 Cutaneous abscess of limb, unspecified: Secondary | ICD-10-CM

## 2012-11-13 DIAGNOSIS — L03119 Cellulitis of unspecified part of limb: Secondary | ICD-10-CM

## 2012-11-13 LAB — POCT INR: INR: 2.6

## 2012-11-13 NOTE — Progress Notes (Signed)
S:    Patient arrives in good spirits.    She presents to the clinic for PADABI evaluation.   Reports pain with walking.  Pain is described as constant dull/aching pain that never goes away.  Reports pain starting while at rest or standing still.Reports pain when walking at an ordinary pace on a level surface.   Denies pain resolves on sitting after rest.  Pain is localized to bilateral knees and left calf. Cellulitis is significantly improved following tx per patient   O:  Lower extremity Physical Exam includes pallor and trace edema bilateral Cellulitis area appears cool and well healed.   ABI overall = 1.33. Right Arm 90 mmHg    Left Arm 84 mmHg Right ankle posterior tibial 128 mmHg     dorsalis pedis 122 mmHg Left ankle posterior tibial 120 mmHg    dorsalis pedis 116 mmHg   A/P: Normal ABI and low likelihood of PAD based on ABI of greater than 1 in a patient with symptoms of  lower extremity pain. Reviewed results with patient. Total time 25 minutes. Follow up with Dr. Richarda Blade in 1 week. Patient seen with Georga Kaufmann, PharmD Resident

## 2012-11-13 NOTE — Progress Notes (Signed)
  Subjective:    Patient ID: Allison [name], female    DOB: 1955/10/06, 57 y.o.   MRN: [id number]  HPI  Reviewed: Agree with Dr. Macky Lower documentation and management    Review of Systems     Objective:   Physical Exam        Assessment & Plan:

## 2012-11-13 NOTE — Patient Instructions (Signed)
Blood flow in your legs is higher in pressure than in your arms.   You have adequate blood flow to your extremities.

## 2012-11-13 NOTE — Assessment & Plan Note (Signed)
Normal ABI and low likelihood of PAD based on ABI of greater than 1 in a patient with symptoms of  lower extremity pain. Reviewed results with patient. Total time 25 minutes. Follow up with Dr. Richarda Blade in 1 week. Patient seen with Georga Kaufmann, PharmD Resident

## 2012-11-14 ENCOUNTER — Ambulatory Visit: Payer: BC Managed Care – PPO

## 2012-11-18 ENCOUNTER — Ambulatory Visit: Payer: BC Managed Care – PPO

## 2012-11-18 ENCOUNTER — Ambulatory Visit (INDEPENDENT_AMBULATORY_CARE_PROVIDER_SITE_OTHER): Payer: BC Managed Care – PPO | Admitting: Family Medicine

## 2012-11-18 ENCOUNTER — Ambulatory Visit (INDEPENDENT_AMBULATORY_CARE_PROVIDER_SITE_OTHER): Payer: BC Managed Care – PPO | Admitting: *Deleted

## 2012-11-18 ENCOUNTER — Encounter: Payer: Self-pay | Admitting: Family Medicine

## 2012-11-18 VITALS — BP 105/65 | HR 103 | Temp 99.1°F | Ht 66.0 in | Wt 282.0 lb

## 2012-11-18 DIAGNOSIS — Z7901 Long term (current) use of anticoagulants: Secondary | ICD-10-CM

## 2012-11-18 DIAGNOSIS — I2699 Other pulmonary embolism without acute cor pulmonale: Secondary | ICD-10-CM

## 2012-11-18 DIAGNOSIS — IMO0002 Reserved for concepts with insufficient information to code with codable children: Secondary | ICD-10-CM

## 2012-11-18 DIAGNOSIS — N200 Calculus of kidney: Secondary | ICD-10-CM

## 2012-11-18 DIAGNOSIS — F411 Generalized anxiety disorder: Secondary | ICD-10-CM

## 2012-11-18 DIAGNOSIS — E1165 Type 2 diabetes mellitus with hyperglycemia: Secondary | ICD-10-CM

## 2012-11-18 DIAGNOSIS — L03115 Cellulitis of right lower limb: Secondary | ICD-10-CM

## 2012-11-18 DIAGNOSIS — L02419 Cutaneous abscess of limb, unspecified: Secondary | ICD-10-CM

## 2012-11-18 LAB — POCT INR: INR: 3

## 2012-11-18 MED ORDER — FLUCONAZOLE 150 MG PO TABS: 150.0000 mg | ORAL_TABLET | Freq: Once | ORAL | Status: DC

## 2012-11-18 MED ORDER — ALPRAZOLAM 0.5 MG PO TABS: 0.5000 mg | ORAL_TABLET | Freq: Three times a day (TID) | ORAL | Status: DC | PRN

## 2012-11-18 NOTE — Assessment & Plan Note (Signed)
Recurrent leg infections since hospitalization. ABI normal, no neuropathy - nasal swab for staph colonization, if positive will do nasal mupirocin

## 2012-11-18 NOTE — Progress Notes (Signed)
  Subjective:    Patient ID: Allison [name], female    DOB: 05-Aug-1955, 57 y.o.   MRN: [id number]  HPI  Cellulitis: This has resolved since last visit. The scab came off and she has some pink area of scarring but no pain.  Nephrolithiasis: She has had another stone since her last visit, it was very small. There has been no further hematuria. The stones have been happening for over a year with no analysis done. She has collected the stones and brought them in a bag.  Review of Systems     Objective:   Physical Exam        Assessment & Plan:

## 2012-11-18 NOTE — Addendum Note (Signed)
Addended by: Radene Ou on: 11/18/2012 12:04 PM   Modules accepted: Orders

## 2012-11-18 NOTE — Assessment & Plan Note (Signed)
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
## 2012-11-18 NOTE — Patient Instructions (Signed)
Today we discussed your kidney stones and leg infection.  For the stones we will analyze them to determine what type they are and that will guide [place] toward the right treatment for them. Please also keep drinking a lot of water and limiting the amount of sodium in your diet.  For the infections we will swab your nose to see if you are colonized and determine whether treating that will decrease your infections. I also gave you diflucan for a yeast infection caused by the antibiotics. Please take one pill today and repeat in 2 days if your symptoms continue.  Thank you!  Dr. Richarda Blade

## 2012-11-18 NOTE — Assessment & Plan Note (Signed)
Patient already on hctz, stones recurrent x1 year, negative urology w/u in past, uric acid elevated in 2010 - stone analysis - counseled to decrease salt in diet and continue hydration - continue hctz - further recommendations depending on stone analysis

## 2012-11-20 LAB — MRSA CULTURE

## 2012-11-23 LAB — STONE ANALYSIS

## 2012-11-24 ENCOUNTER — Telehealth: Payer: Self-pay | Admitting: Family Medicine

## 2012-11-24 ENCOUNTER — Other Ambulatory Visit: Payer: Self-pay | Admitting: Family Medicine

## 2012-11-24 DIAGNOSIS — N200 Calculus of kidney: Secondary | ICD-10-CM | POA: Insufficient documentation

## 2012-11-24 HISTORY — DX: Calculus of kidney: N20.0

## 2012-11-24 MED ORDER — POTASSIUM CITRATE ER 10 MEQ (1080 MG) PO TBCR: 10.0000 meq | EXTENDED_RELEASE_TABLET | Freq: Three times a day (TID) | ORAL | Status: DC

## 2012-11-24 NOTE — Telephone Encounter (Signed)
Called patient re: lab results. Sent urocit to her pharmacy for urine alkalinization and referred to urology given existing renal impairment and recurrent stones

## 2012-11-28 ENCOUNTER — Ambulatory Visit (INDEPENDENT_AMBULATORY_CARE_PROVIDER_SITE_OTHER): Payer: BC Managed Care – PPO | Admitting: *Deleted

## 2012-11-28 DIAGNOSIS — Z7901 Long term (current) use of anticoagulants: Secondary | ICD-10-CM

## 2012-11-28 DIAGNOSIS — Z23 Encounter for immunization: Secondary | ICD-10-CM

## 2012-11-28 DIAGNOSIS — I2699 Other pulmonary embolism without acute cor pulmonale: Secondary | ICD-10-CM

## 2012-12-12 ENCOUNTER — Ambulatory Visit (INDEPENDENT_AMBULATORY_CARE_PROVIDER_SITE_OTHER): Payer: BC Managed Care – PPO | Admitting: *Deleted

## 2012-12-12 DIAGNOSIS — Z7901 Long term (current) use of anticoagulants: Secondary | ICD-10-CM

## 2012-12-12 DIAGNOSIS — I2699 Other pulmonary embolism without acute cor pulmonale: Secondary | ICD-10-CM

## 2012-12-15 ENCOUNTER — Other Ambulatory Visit: Payer: Self-pay | Admitting: Family Medicine

## 2012-12-22 ENCOUNTER — Other Ambulatory Visit: Payer: Self-pay | Admitting: Family Medicine

## 2013-01-09 ENCOUNTER — Ambulatory Visit (INDEPENDENT_AMBULATORY_CARE_PROVIDER_SITE_OTHER): Payer: BC Managed Care – PPO | Admitting: *Deleted

## 2013-01-09 DIAGNOSIS — Z7901 Long term (current) use of anticoagulants: Secondary | ICD-10-CM

## 2013-01-09 DIAGNOSIS — I2699 Other pulmonary embolism without acute cor pulmonale: Secondary | ICD-10-CM

## 2013-01-09 LAB — POCT INR: INR: 2.3

## 2013-02-06 ENCOUNTER — Other Ambulatory Visit: Payer: Self-pay | Admitting: Family Medicine

## 2013-02-06 ENCOUNTER — Ambulatory Visit (INDEPENDENT_AMBULATORY_CARE_PROVIDER_SITE_OTHER): Payer: BC Managed Care – PPO | Admitting: *Deleted

## 2013-02-06 DIAGNOSIS — Z7901 Long term (current) use of anticoagulants: Secondary | ICD-10-CM

## 2013-02-06 DIAGNOSIS — I2699 Other pulmonary embolism without acute cor pulmonale: Secondary | ICD-10-CM

## 2013-02-06 DIAGNOSIS — F411 Generalized anxiety disorder: Secondary | ICD-10-CM

## 2013-02-06 LAB — POCT INR: INR: 3.2

## 2013-02-06 MED ORDER — ALPRAZOLAM 0.5 MG PO TABS: 0.5000 mg | ORAL_TABLET | Freq: Three times a day (TID) | ORAL | Status: DC | PRN

## 2013-02-18 ENCOUNTER — Other Ambulatory Visit: Payer: Self-pay | Admitting: Family Medicine

## 2013-02-20 ENCOUNTER — Other Ambulatory Visit: Payer: Self-pay | Admitting: Family Medicine

## 2013-02-20 ENCOUNTER — Ambulatory Visit (INDEPENDENT_AMBULATORY_CARE_PROVIDER_SITE_OTHER): Payer: BC Managed Care – PPO | Admitting: *Deleted

## 2013-02-20 DIAGNOSIS — I2699 Other pulmonary embolism without acute cor pulmonale: Secondary | ICD-10-CM

## 2013-02-20 DIAGNOSIS — Z7901 Long term (current) use of anticoagulants: Secondary | ICD-10-CM

## 2013-02-20 MED ORDER — WARFARIN SODIUM 4 MG PO TABS: ORAL_TABLET | ORAL | Status: DC

## 2013-03-06 ENCOUNTER — Telehealth: Payer: Self-pay | Admitting: *Deleted

## 2013-03-06 ENCOUNTER — Ambulatory Visit (INDEPENDENT_AMBULATORY_CARE_PROVIDER_SITE_OTHER): Payer: BC Managed Care – PPO | Admitting: *Deleted

## 2013-03-06 DIAGNOSIS — Z7901 Long term (current) use of anticoagulants: Secondary | ICD-10-CM

## 2013-03-06 DIAGNOSIS — I2699 Other pulmonary embolism without acute cor pulmonale: Secondary | ICD-10-CM

## 2013-03-06 DIAGNOSIS — E1165 Type 2 diabetes mellitus with hyperglycemia: Secondary | ICD-10-CM

## 2013-03-06 NOTE — Progress Notes (Signed)
   When the patient takes the diflucan, she will reduce the warfarin dose to 60% of normal dose - taking 4 mg - Mon & Fri, 2.5 mg - other days.  Recheck 1 week after taking diflucan.   Dewitt Hoes, MLS

## 2013-03-06 NOTE — Telephone Encounter (Signed)
Patient was here this morning for her INR, she will be returning in a couple of weeks. Will plan on drawing the LDL at that time.Busick, Rodena Medin

## 2013-03-09 ENCOUNTER — Other Ambulatory Visit: Payer: Self-pay | Admitting: Family Medicine

## 2013-03-09 DIAGNOSIS — L03115 Cellulitis of right lower limb: Secondary | ICD-10-CM

## 2013-03-09 MED ORDER — FLUCONAZOLE 150 MG PO TABS: 150.0000 mg | ORAL_TABLET | Freq: Once | ORAL | Status: DC

## 2013-03-09 MED ORDER — WARFARIN SODIUM 4 MG PO TABS: ORAL_TABLET | ORAL | Status: DC

## 2013-03-10 ENCOUNTER — Telehealth: Payer: Self-pay | Admitting: *Deleted

## 2013-03-10 NOTE — Telephone Encounter (Signed)
Received a call from patient requesting a refill on her coumadin and an Rx for diflucan. I looked in the computer and both Rx's have already been sent to her pharmacy. I informed her that the pharmacy should have her meds and if not to call back and I would call them in for her.Allison [name], Rodena Medin

## 2013-03-16 ENCOUNTER — Telehealth: Payer: Self-pay | Admitting: Family Medicine

## 2013-03-16 ENCOUNTER — Other Ambulatory Visit: Payer: Self-pay | Admitting: Family Medicine

## 2013-03-16 MED ORDER — LISINOPRIL-HYDROCHLOROTHIAZIDE 20-12.5 MG PO TABS: 1.0000 | ORAL_TABLET | Freq: Every day | ORAL | Status: DC

## 2013-03-16 MED ORDER — WARFARIN SODIUM 2 MG PO TABS: ORAL_TABLET | ORAL | Status: DC

## 2013-03-16 NOTE — Telephone Encounter (Signed)
Called pt. LMVM. Waiting on call back. Please see message. Lorenda Hatchet, Renato Battles

## 2013-03-16 NOTE — Telephone Encounter (Signed)
Prescription sent to pharmacy as well as 2mg  warfarin as the pharmacy was apparently out of 4mg .

## 2013-03-16 NOTE — Telephone Encounter (Signed)
Refill request for Lisinopril. Patient been out since last week. [facility] states they faxed [place] 2x.

## 2013-03-16 NOTE — Telephone Encounter (Signed)
Patient informed. 

## 2013-03-16 NOTE — Telephone Encounter (Signed)
Will fwd. To PCP for refill. .Jayelle Page  

## 2013-03-17 ENCOUNTER — Other Ambulatory Visit: Payer: Self-pay | Admitting: Family Medicine

## 2013-03-18 ENCOUNTER — Other Ambulatory Visit: Payer: Self-pay | Admitting: Family Medicine

## 2013-03-18 MED ORDER — METFORMIN HCL 1000 MG PO TABS: 1000.0000 mg | ORAL_TABLET | Freq: Two times a day (BID) | ORAL | Status: DC

## 2013-03-20 ENCOUNTER — Ambulatory Visit (INDEPENDENT_AMBULATORY_CARE_PROVIDER_SITE_OTHER): Payer: BC Managed Care – PPO | Admitting: *Deleted

## 2013-03-20 DIAGNOSIS — Z7901 Long term (current) use of anticoagulants: Secondary | ICD-10-CM

## 2013-03-20 DIAGNOSIS — I2699 Other pulmonary embolism without acute cor pulmonale: Secondary | ICD-10-CM

## 2013-03-20 LAB — POCT INR: INR: 2.5

## 2013-04-05 ENCOUNTER — Other Ambulatory Visit: Payer: Self-pay | Admitting: Family Medicine

## 2013-04-06 ENCOUNTER — Ambulatory Visit (INDEPENDENT_AMBULATORY_CARE_PROVIDER_SITE_OTHER): Payer: BC Managed Care – PPO | Admitting: *Deleted

## 2013-04-06 DIAGNOSIS — E1165 Type 2 diabetes mellitus with hyperglycemia: Secondary | ICD-10-CM

## 2013-04-06 DIAGNOSIS — I2699 Other pulmonary embolism without acute cor pulmonale: Secondary | ICD-10-CM

## 2013-04-06 DIAGNOSIS — IMO0002 Reserved for concepts with insufficient information to code with codable children: Secondary | ICD-10-CM

## 2013-04-06 DIAGNOSIS — E118 Type 2 diabetes mellitus with unspecified complications: Principal | ICD-10-CM

## 2013-04-06 DIAGNOSIS — Z7901 Long term (current) use of anticoagulants: Secondary | ICD-10-CM

## 2013-04-06 LAB — LDL CHOLESTEROL, DIRECT: LDL DIRECT: 105 mg/dL — AB

## 2013-04-06 LAB — POCT INR: INR: 2.3

## 2013-04-08 ENCOUNTER — Other Ambulatory Visit: Payer: Self-pay | Admitting: Family Medicine

## 2013-04-20 ENCOUNTER — Telehealth: Payer: Self-pay | Admitting: *Deleted

## 2013-04-20 NOTE — Telephone Encounter (Signed)
opened in error

## 2013-04-21 ENCOUNTER — Telehealth: Payer: Self-pay | Admitting: Family Medicine

## 2013-04-21 NOTE — Telephone Encounter (Signed)
Refill request for Singulair & alprazolam to [facility] at [phone]

## 2013-04-21 NOTE — Telephone Encounter (Signed)
Forward to PCP for refills.Piercen Covino Lee  

## 2013-04-22 ENCOUNTER — Other Ambulatory Visit: Payer: Self-pay | Admitting: Family Medicine

## 2013-04-22 DIAGNOSIS — F411 Generalized anxiety disorder: Secondary | ICD-10-CM

## 2013-04-22 MED ORDER — MONTELUKAST SODIUM 10 MG PO TABS: 10.0000 mg | ORAL_TABLET | Freq: Every day | ORAL | Status: DC

## 2013-04-22 MED ORDER — ALPRAZOLAM 0.5 MG PO TABS: 0.5000 mg | ORAL_TABLET | Freq: Three times a day (TID) | ORAL | Status: DC | PRN

## 2013-04-22 MED ORDER — LEVOTHYROXINE SODIUM 125 MCG PO TABS: 125.0000 ug | ORAL_TABLET | Freq: Every day | ORAL | Status: DC

## 2013-04-22 NOTE — Telephone Encounter (Signed)
Patient calls again. Patient needs meds sent to [facility] by 04/24/13 or her ordered will be cancelled. She also needs Synthroid refilled. Only has 5 pills left. Attempted to page Dr. Sherril Cong with no response. Please advise.

## 2013-04-22 NOTE — Telephone Encounter (Signed)
Refiils faxed in this afternoon. Please inform patient

## 2013-04-23 NOTE — Telephone Encounter (Signed)
Pt notified.  Adrienne Trombetta L, CMA  

## 2013-05-01 ENCOUNTER — Ambulatory Visit (INDEPENDENT_AMBULATORY_CARE_PROVIDER_SITE_OTHER): Payer: BC Managed Care – PPO | Admitting: *Deleted

## 2013-05-01 ENCOUNTER — Encounter: Payer: Self-pay | Admitting: Family Medicine

## 2013-05-01 ENCOUNTER — Ambulatory Visit (INDEPENDENT_AMBULATORY_CARE_PROVIDER_SITE_OTHER): Payer: BC Managed Care – PPO | Admitting: Family Medicine

## 2013-05-01 VITALS — BP 126/78 | HR 96 | Temp 98.7°F | Ht 66.0 in | Wt 277.0 lb

## 2013-05-01 DIAGNOSIS — E7800 Pure hypercholesterolemia, unspecified: Secondary | ICD-10-CM

## 2013-05-01 DIAGNOSIS — J449 Chronic obstructive pulmonary disease, unspecified: Secondary | ICD-10-CM

## 2013-05-01 DIAGNOSIS — I82409 Acute embolism and thrombosis of unspecified deep veins of unspecified lower extremity: Secondary | ICD-10-CM

## 2013-05-01 DIAGNOSIS — IMO0002 Reserved for concepts with insufficient information to code with codable children: Secondary | ICD-10-CM

## 2013-05-01 DIAGNOSIS — E118 Type 2 diabetes mellitus with unspecified complications: Principal | ICD-10-CM

## 2013-05-01 DIAGNOSIS — J4489 Other specified chronic obstructive pulmonary disease: Secondary | ICD-10-CM

## 2013-05-01 DIAGNOSIS — L03319 Cellulitis of trunk, unspecified: Secondary | ICD-10-CM

## 2013-05-01 DIAGNOSIS — I2699 Other pulmonary embolism without acute cor pulmonale: Secondary | ICD-10-CM

## 2013-05-01 DIAGNOSIS — L02219 Cutaneous abscess of trunk, unspecified: Secondary | ICD-10-CM

## 2013-05-01 DIAGNOSIS — Z7901 Long term (current) use of anticoagulants: Secondary | ICD-10-CM

## 2013-05-01 DIAGNOSIS — I10 Essential (primary) hypertension: Secondary | ICD-10-CM

## 2013-05-01 DIAGNOSIS — E1165 Type 2 diabetes mellitus with hyperglycemia: Secondary | ICD-10-CM

## 2013-05-01 LAB — POCT GLYCOSYLATED HEMOGLOBIN (HGB A1C): Hemoglobin A1C: 8

## 2013-05-01 LAB — POCT INR: INR: 3.1

## 2013-05-01 MED ORDER — METFORMIN HCL 1000 MG PO TABS: 1000.0000 mg | ORAL_TABLET | Freq: Two times a day (BID) | ORAL | Status: DC

## 2013-05-01 MED ORDER — WARFARIN SODIUM 4 MG PO TABS: ORAL_TABLET | ORAL | Status: DC

## 2013-05-01 MED ORDER — VERAPAMIL HCL ER 120 MG PO TBCR: 120.0000 mg | EXTENDED_RELEASE_TABLET | Freq: Every day | ORAL | Status: DC

## 2013-05-01 MED ORDER — LISINOPRIL-HYDROCHLOROTHIAZIDE 20-12.5 MG PO TABS: 1.0000 | ORAL_TABLET | Freq: Every day | ORAL | Status: DC

## 2013-05-01 MED ORDER — ALBUTEROL SULFATE HFA 108 (90 BASE) MCG/ACT IN AERS: 2.0000 | INHALATION_SPRAY | Freq: Four times a day (QID) | RESPIRATORY_TRACT | Status: DC | PRN

## 2013-05-01 MED ORDER — PRAVASTATIN SODIUM 40 MG PO TABS: 40.0000 mg | ORAL_TABLET | Freq: Every day | ORAL | Status: DC

## 2013-05-01 MED ORDER — GLYBURIDE 5 MG PO TABS: 5.0000 mg | ORAL_TABLET | Freq: Every day | ORAL | Status: DC

## 2013-05-01 MED ORDER — WARFARIN SODIUM 5 MG PO TABS: 5.0000 mg | ORAL_TABLET | ORAL | Status: DC

## 2013-05-01 NOTE — Patient Instructions (Signed)

## 2013-05-01 NOTE — Progress Notes (Signed)
   Subjective:    Patient ID: Allison [name], female    DOB: 10-Feb-1956, 58 y.o.   MRN: [id number]  HPI Pt presents with painful boil under left arm. Started about 1 month ago, started draining earlier this week. No fever or other systemic symptoms. Pain with palpation. Has not tried any therapy. No factors make it better or worse.   Review of Systems  Constitutional: Negative for fever.  Respiratory: Negative for shortness of breath.   Cardiovascular: Negative for chest pain.  Gastrointestinal: Negative for abdominal pain.  All other systems reviewed and are negative.       Objective:   Physical Exam  Nursing note and vitals reviewed. Constitutional: She is oriented to person, place, and time. She appears well-developed and well-nourished. No distress.  HENT:  Head: Normocephalic and atraumatic.  Eyes: Conjunctivae are normal. Right eye exhibits no discharge. Left eye exhibits no discharge.  Neck: Normal range of motion.  Cardiovascular: Normal rate, regular rhythm, normal heart sounds and intact distal pulses.   No murmur heard. Pulmonary/Chest: Effort normal and breath sounds normal. No respiratory distress. She has no wheezes.  Abdominal: Soft. She exhibits no distension. There is no tenderness.  Musculoskeletal:       Arms: Neurological: She is alert and oriented to person, place, and time.  Skin: Skin is warm and dry. She is not diaphoretic.  Psychiatric: She has a normal mood and affect. Her behavior is normal.          Assessment & Plan:

## 2013-05-01 NOTE — Assessment & Plan Note (Signed)
Painful abscess under left arm, draining since earlier this week.  - counseled patient to keep covered, clean with soap and water frequently, continue to express fluid with cleanings. - return for spreading, worsening pain, systemic symptoms, would start antibiotics at that point.

## 2013-05-01 NOTE — Assessment & Plan Note (Signed)
Poor control today, A1C 8.0, patient reports indiscretion over holidays, does not wish to increase therapies. - continue current metformin and glyburide - if control not improved next visit will need to add another agent

## 2013-05-08 ENCOUNTER — Ambulatory Visit (INDEPENDENT_AMBULATORY_CARE_PROVIDER_SITE_OTHER): Payer: BC Managed Care – PPO | Admitting: *Deleted

## 2013-05-08 DIAGNOSIS — Z7901 Long term (current) use of anticoagulants: Secondary | ICD-10-CM

## 2013-05-08 DIAGNOSIS — I2699 Other pulmonary embolism without acute cor pulmonale: Secondary | ICD-10-CM

## 2013-05-08 LAB — POCT INR: INR: 2.2

## 2013-05-22 ENCOUNTER — Ambulatory Visit: Payer: BC Managed Care – PPO

## 2013-05-25 ENCOUNTER — Ambulatory Visit (INDEPENDENT_AMBULATORY_CARE_PROVIDER_SITE_OTHER): Payer: BC Managed Care – PPO | Admitting: *Deleted

## 2013-05-25 DIAGNOSIS — I2699 Other pulmonary embolism without acute cor pulmonale: Secondary | ICD-10-CM

## 2013-05-25 DIAGNOSIS — Z7901 Long term (current) use of anticoagulants: Secondary | ICD-10-CM

## 2013-05-25 LAB — POCT INR: INR: 2.2

## 2013-06-12 ENCOUNTER — Encounter: Payer: Self-pay | Admitting: Emergency Medicine

## 2013-06-12 ENCOUNTER — Ambulatory Visit (INDEPENDENT_AMBULATORY_CARE_PROVIDER_SITE_OTHER): Payer: BC Managed Care – PPO | Admitting: Emergency Medicine

## 2013-06-12 VITALS — BP 133/75 | HR 106 | Temp 99.0°F | Ht 66.0 in | Wt 274.0 lb

## 2013-06-12 DIAGNOSIS — J209 Acute bronchitis, unspecified: Secondary | ICD-10-CM

## 2013-06-12 MED ORDER — PREDNISONE 50 MG PO TABS: 50.0000 mg | ORAL_TABLET | Freq: Every day | ORAL | Status: DC

## 2013-06-12 MED ORDER — DOXYCYCLINE HYCLATE 100 MG PO TABS: 100.0000 mg | ORAL_TABLET | Freq: Two times a day (BID) | ORAL | Status: DC

## 2013-06-12 MED ORDER — BENZONATATE 100 MG PO CAPS: 100.0000 mg | ORAL_CAPSULE | Freq: Two times a day (BID) | ORAL | Status: DC | PRN

## 2013-06-12 NOTE — Patient Instructions (Signed)
It was nice to meet you! You have bronchitis.  Take doxycycline 1 pill twice a day for 10 days. Take prednisone 1 pill daily for 5 days. Use tessalon pearls twice a day as needed for cough. You can also take a teaspoon of honey as needed for the cough.  You should be feeling better by Monday, the cough will last at least a few more weeks, but it should gradually improve. If things are getting worse, please come back.   Acute Bronchitis Bronchitis is when the airways that extend from the windpipe into the lungs get red, puffy, and painful (inflamed). Bronchitis often causes thick spit (mucus) to develop. This leads to a cough. A cough is the most common symptom of bronchitis. In acute bronchitis, the condition usually begins suddenly and goes away over time (usually in 2 weeks). Smoking, allergies, and asthma can make bronchitis worse. Repeated episodes of bronchitis may cause more lung problems. HOME CARE  Rest.  Drink enough fluids to keep your pee (urine) clear or pale yellow (unless you need to limit fluids as told by your doctor).  Only take over-the-counter or prescription medicines as told by your doctor.  Avoid smoking and secondhand smoke. These can make bronchitis worse. If you are a smoker, think about using nicotine gum or skin patches. Quitting smoking will help your lungs heal faster.  Reduce the chance of getting bronchitis again by:  Washing your hands often.  Avoiding people with cold symptoms.  Trying not to touch your hands to your mouth, nose, or eyes.  Follow up with your doctor as told. GET HELP IF: Your symptoms do not improve after 1 week of treatment. Symptoms include:  Cough.  Fever.  Coughing up thick spit.  Body aches.  Chest congestion.  Chills.  Shortness of breath.  Sore throat. GET HELP RIGHT AWAY IF:   You have an increased fever.  You have chills.  You have severe shortness of breath.  You have bloody thick spit  (sputum).  You throw up (vomit) often.  You lose too much body fluid (dehydration).  You have a severe headache.  You faint. MAKE SURE YOU:   Understand these instructions.  Will watch your condition.  Will get help right away if you are not doing well or get worse. Document Released: 09/05/2007 Document Revised: 11/19/2012 Document Reviewed: 09/09/2012 [facility] Patient Information 2014 [place].

## 2013-06-12 NOTE — Progress Notes (Signed)
Subjective:    Patient ID: Allison [name], female    DOB: 05-Nov-1955, 58 y.o.   MRN: [id number]  HPI Allison [name] is here for a SDA for cough.  She reports a cough for the last 2-3 weeks.  Initially started with some post nasal drip.  Sinus symptoms have improved, but the cough has worsened.  Associated with mild shortness of breath and wheezing.  Using albuterol several times a day with some improvement.  Cough and wheeze get worse at night.  Cough is non productive, but reports chest congestion.  Having some chills and sweats, but no fevers.  Eating and drinking well.  No nausea or vomiting.  Some abdominal pain with cough only.  States feels similar to a prior episode of bronchitis.  Never smoker, does have asthma.  Current Outpatient Prescriptions on File Prior to Visit  Medication Sig Dispense Refill  . albuterol (PROAIR HFA) 108 (90 BASE) MCG/ACT inhaler Inhale 2 puffs into the lungs every 6 (six) hours as needed for wheezing or shortness of breath.  18 g  3  . ALPRAZolam (XANAX) 0.5 MG tablet Take 1 tablet (0.5 mg total) by mouth 3 (three) times daily as needed for sleep or anxiety.  90 tablet  3  . calcium-vitamin D (OSCAL WITH D) 500-200 MG-UNIT per tablet Take 1 tablet by mouth 2 (two) times daily.      . cetirizine (ZYRTEC) 10 MG tablet Take 10 mg by mouth daily.        . fluconazole (DIFLUCAN) 150 MG tablet Take 1 tablet (150 mg total) by mouth once. Take 2nd pill in 2 days if needed  2 tablet  0  . fluticasone (FLONASE) 50 MCG/ACT nasal spray Place 2 sprays into the nose daily.  16 g  6  . glyBURIDE (DIABETA) 5 MG tablet Take 1 tablet (5 mg total) by mouth daily with breakfast.  90 tablet  3  . levothyroxine (SYNTHROID, LEVOTHROID) 125 MCG tablet Take 1 tablet (125 mcg total) by mouth daily before breakfast.  90 tablet  3  . lisinopril-hydrochlorothiazide (PRINZIDE,ZESTORETIC) 20-12.5 MG per tablet Take 1 tablet by mouth daily.  90 tablet  3  . metFORMIN (GLUCOPHAGE) 1000 MG  tablet Take 1 tablet (1,000 mg total) by mouth 2 (two) times daily with a meal.  180 tablet  3  . montelukast (SINGULAIR) 10 MG tablet Take 1 tablet (10 mg total) by mouth at bedtime.  90 tablet  3  . mupirocin ointment (BACTROBAN) 2 % Apply topically 3 (three) times daily.  30 g  0  . potassium citrate (UROCIT-K) 10 MEQ (1080 MG) SR tablet Take 1 tablet (10 mEq total) by mouth 3 (three) times daily with meals.  90 tablet  0  . pravastatin (PRAVACHOL) 40 MG tablet Take 1 tablet (40 mg total) by mouth daily.  90 tablet  3  . verapamil (CALAN-SR) 120 MG CR tablet Take 1 tablet (120 mg total) by mouth at bedtime.  90 tablet  3  . warfarin (COUMADIN) 2 MG tablet Take as directed in schedule from [place]. May take 2 of the 2mg  tabs where Horris Latino instructed you to take 1 of the 4mg .  30 tablet  11  . warfarin (COUMADIN) 4 MG tablet Take as directed  90 tablet  3  . warfarin (COUMADIN) 5 MG tablet Take 1 tablet (5 mg total) by mouth as directed.  90 tablet  3  . [DISCONTINUED] albuterol (PROVENTIL,VENTOLIN) 90 MCG/ACT inhaler Inhale 2  puffs into the lungs every 6 (six) hours as needed.  17 g  3   No current facility-administered medications on file prior to visit.    I have reviewed and updated the following as appropriate: allergies and current medications SHx: never smoker   Review of Systems See HPI    Objective:   Physical Exam BP 133/75  Pulse 106  Temp(Src) 99 F (37.2 C) (Oral)  Ht 5\' 6"  (1.676 m)  Wt 274 lb (124.286 kg)  BMI 44.25 kg/m2  SpO2 99% Gen: alert, cooperative, NAD, coughing HEENT: AT/[place], sclera white, MMM, no pharyngeal erythema, exudate or post-nasal drip Neck: supple, no LAD CV: RRR, no murmurs Pulm: good air movement; normal work of breathing; diffuse coarse breath sounds without focal rales; scattered expiratory wheezes      Assessment & Plan:

## 2013-06-12 NOTE — Assessment & Plan Note (Signed)
History and exam consistent. No fevers or focal lung findings to suggest pneumonia. Will treat with doxy, prednisone and tessalon pearls. Discussed time course and reasons to return as in AVS. F/u in 1 week if not improving.

## 2013-06-19 ENCOUNTER — Ambulatory Visit (INDEPENDENT_AMBULATORY_CARE_PROVIDER_SITE_OTHER): Payer: BC Managed Care – PPO | Admitting: *Deleted

## 2013-06-19 DIAGNOSIS — I2699 Other pulmonary embolism without acute cor pulmonale: Secondary | ICD-10-CM

## 2013-06-19 DIAGNOSIS — Z7901 Long term (current) use of anticoagulants: Secondary | ICD-10-CM

## 2013-06-19 LAB — POCT INR: INR: 3

## 2013-07-17 ENCOUNTER — Ambulatory Visit (INDEPENDENT_AMBULATORY_CARE_PROVIDER_SITE_OTHER): Payer: BC Managed Care – PPO | Admitting: *Deleted

## 2013-07-17 DIAGNOSIS — Z7901 Long term (current) use of anticoagulants: Secondary | ICD-10-CM

## 2013-07-17 DIAGNOSIS — I2699 Other pulmonary embolism without acute cor pulmonale: Secondary | ICD-10-CM

## 2013-07-17 LAB — POCT INR: INR: 2.2

## 2013-08-14 ENCOUNTER — Ambulatory Visit (INDEPENDENT_AMBULATORY_CARE_PROVIDER_SITE_OTHER): Payer: BC Managed Care – PPO | Admitting: *Deleted

## 2013-08-14 DIAGNOSIS — I2699 Other pulmonary embolism without acute cor pulmonale: Secondary | ICD-10-CM

## 2013-08-14 DIAGNOSIS — Z7901 Long term (current) use of anticoagulants: Secondary | ICD-10-CM

## 2013-08-14 LAB — POCT INR: INR: 2.4

## 2013-09-08 ENCOUNTER — Telehealth: Payer: Self-pay | Admitting: Family Medicine

## 2013-09-08 DIAGNOSIS — IMO0002 Reserved for concepts with insufficient information to code with codable children: Secondary | ICD-10-CM

## 2013-09-08 DIAGNOSIS — E1165 Type 2 diabetes mellitus with hyperglycemia: Secondary | ICD-10-CM

## 2013-09-08 DIAGNOSIS — E118 Type 2 diabetes mellitus with unspecified complications: Principal | ICD-10-CM

## 2013-09-08 NOTE — Telephone Encounter (Signed)
Called and spoke with patient, she verified that she is taking 1 tab BID. Forward to PCP to change Rx and [facility].Jaymes Graff Busick

## 2013-09-08 NOTE — Telephone Encounter (Signed)
RX called in for 1 tablet daily for gliberide (diabetes) Pt takes 2 tabs per day [facility]

## 2013-09-09 MED ORDER — GLYBURIDE 5 MG PO TABS: 5.0000 mg | ORAL_TABLET | Freq: Two times a day (BID) | ORAL | Status: DC

## 2013-09-11 ENCOUNTER — Ambulatory Visit (INDEPENDENT_AMBULATORY_CARE_PROVIDER_SITE_OTHER): Payer: BC Managed Care – PPO | Admitting: *Deleted

## 2013-09-11 DIAGNOSIS — Z7901 Long term (current) use of anticoagulants: Secondary | ICD-10-CM

## 2013-09-11 DIAGNOSIS — I2699 Other pulmonary embolism without acute cor pulmonale: Secondary | ICD-10-CM

## 2013-09-11 LAB — POCT INR: INR: 2.2

## 2013-10-09 ENCOUNTER — Ambulatory Visit (INDEPENDENT_AMBULATORY_CARE_PROVIDER_SITE_OTHER): Payer: BC Managed Care – PPO | Admitting: *Deleted

## 2013-10-09 ENCOUNTER — Telehealth: Payer: Self-pay

## 2013-10-09 DIAGNOSIS — E118 Type 2 diabetes mellitus with unspecified complications: Principal | ICD-10-CM

## 2013-10-09 DIAGNOSIS — Z7901 Long term (current) use of anticoagulants: Secondary | ICD-10-CM

## 2013-10-09 DIAGNOSIS — I2699 Other pulmonary embolism without acute cor pulmonale: Secondary | ICD-10-CM

## 2013-10-09 DIAGNOSIS — E1165 Type 2 diabetes mellitus with hyperglycemia: Secondary | ICD-10-CM

## 2013-10-09 DIAGNOSIS — IMO0002 Reserved for concepts with insufficient information to code with codable children: Secondary | ICD-10-CM

## 2013-10-09 LAB — POCT GLYCOSYLATED HEMOGLOBIN (HGB A1C): Hemoglobin A1C: 9

## 2013-10-09 LAB — POCT INR: INR: 2.5

## 2013-10-09 NOTE — Telephone Encounter (Signed)
Message copied by Ozella Almond on Fri Oct 09, 2013  2:54 PM ------      Message from: Frazier Richards      Created: Fri Oct 09, 2013 12:17 PM       Please inform patient that her A1c has gone up significantly to 9 and ask her to make an appointment with Dr. Valentina Lucks to talk about diabetes education and next steps (including the possibility of insulin). Thanks! ------

## 2013-10-09 NOTE — Telephone Encounter (Signed)
Left message for pt. To call back for test results. Shelly

## 2013-10-10 LAB — LDL CHOLESTEROL, DIRECT: LDL DIRECT: 90 mg/dL

## 2013-11-06 ENCOUNTER — Ambulatory Visit (INDEPENDENT_AMBULATORY_CARE_PROVIDER_SITE_OTHER): Payer: BC Managed Care – PPO | Admitting: *Deleted

## 2013-11-06 DIAGNOSIS — I2699 Other pulmonary embolism without acute cor pulmonale: Secondary | ICD-10-CM

## 2013-11-06 DIAGNOSIS — Z7901 Long term (current) use of anticoagulants: Secondary | ICD-10-CM

## 2013-11-06 LAB — POCT INR: INR: 1.9

## 2013-11-10 ENCOUNTER — Telehealth: Payer: Self-pay | Admitting: Family Medicine

## 2013-11-10 NOTE — Telephone Encounter (Signed)
Pt called and needs a refill Xanax called in to [facility] in [place]. jw

## 2013-11-11 ENCOUNTER — Other Ambulatory Visit: Payer: Self-pay | Admitting: Family Medicine

## 2013-11-11 DIAGNOSIS — F411 Generalized anxiety disorder: Secondary | ICD-10-CM

## 2013-11-11 MED ORDER — ALPRAZOLAM 0.5 MG PO TABS: 0.5000 mg | ORAL_TABLET | Freq: Three times a day (TID) | ORAL | Status: DC | PRN

## 2013-11-11 NOTE — Telephone Encounter (Signed)
Refill called in to pharmacy. Please inform patient.

## 2013-11-11 NOTE — Telephone Encounter (Signed)
Left message on voicemail informing that rx had been sent in. 

## 2013-11-27 ENCOUNTER — Ambulatory Visit (INDEPENDENT_AMBULATORY_CARE_PROVIDER_SITE_OTHER): Payer: BC Managed Care – PPO | Admitting: *Deleted

## 2013-11-27 DIAGNOSIS — I2699 Other pulmonary embolism without acute cor pulmonale: Secondary | ICD-10-CM

## 2013-11-27 DIAGNOSIS — Z7901 Long term (current) use of anticoagulants: Secondary | ICD-10-CM

## 2013-11-27 LAB — POCT INR: INR: 2.4

## 2013-12-25 ENCOUNTER — Ambulatory Visit (INDEPENDENT_AMBULATORY_CARE_PROVIDER_SITE_OTHER): Payer: BC Managed Care – PPO | Admitting: *Deleted

## 2013-12-25 DIAGNOSIS — I2699 Other pulmonary embolism without acute cor pulmonale: Secondary | ICD-10-CM

## 2013-12-25 DIAGNOSIS — Z7901 Long term (current) use of anticoagulants: Secondary | ICD-10-CM | POA: Diagnosis not present

## 2013-12-25 LAB — POCT INR: INR: 2.3

## 2014-01-22 ENCOUNTER — Ambulatory Visit (INDEPENDENT_AMBULATORY_CARE_PROVIDER_SITE_OTHER): Payer: BC Managed Care – PPO | Admitting: *Deleted

## 2014-01-22 DIAGNOSIS — I2699 Other pulmonary embolism without acute cor pulmonale: Secondary | ICD-10-CM

## 2014-01-22 DIAGNOSIS — Z7901 Long term (current) use of anticoagulants: Secondary | ICD-10-CM | POA: Diagnosis not present

## 2014-01-22 LAB — POCT INR: INR: 2.7

## 2014-02-19 ENCOUNTER — Ambulatory Visit (INDEPENDENT_AMBULATORY_CARE_PROVIDER_SITE_OTHER): Payer: BC Managed Care – PPO | Admitting: *Deleted

## 2014-02-19 DIAGNOSIS — Z86711 Personal history of pulmonary embolism: Secondary | ICD-10-CM

## 2014-02-19 DIAGNOSIS — Z7901 Long term (current) use of anticoagulants: Secondary | ICD-10-CM

## 2014-02-19 LAB — POCT INR: INR: 2.3

## 2014-04-06 ENCOUNTER — Other Ambulatory Visit: Payer: Self-pay | Admitting: *Deleted

## 2014-04-06 DIAGNOSIS — J4520 Mild intermittent asthma, uncomplicated: Secondary | ICD-10-CM

## 2014-04-06 DIAGNOSIS — E7800 Pure hypercholesterolemia, unspecified: Secondary | ICD-10-CM

## 2014-04-06 DIAGNOSIS — E118 Type 2 diabetes mellitus with unspecified complications: Secondary | ICD-10-CM

## 2014-04-06 DIAGNOSIS — I10 Essential (primary) hypertension: Secondary | ICD-10-CM

## 2014-04-07 ENCOUNTER — Other Ambulatory Visit: Payer: Self-pay | Admitting: Family Medicine

## 2014-04-07 DIAGNOSIS — J4520 Mild intermittent asthma, uncomplicated: Secondary | ICD-10-CM

## 2014-04-07 MED ORDER — METFORMIN HCL 1000 MG PO TABS: 1000.0000 mg | ORAL_TABLET | Freq: Two times a day (BID) | ORAL | Status: DC

## 2014-04-07 MED ORDER — VERAPAMIL HCL ER 120 MG PO TBCR: 120.0000 mg | EXTENDED_RELEASE_TABLET | Freq: Every day | ORAL | Status: DC

## 2014-04-07 MED ORDER — PRAVASTATIN SODIUM 40 MG PO TABS: 40.0000 mg | ORAL_TABLET | Freq: Every day | ORAL | Status: DC

## 2014-04-07 MED ORDER — MONTELUKAST SODIUM 10 MG PO TABS: 10.0000 mg | ORAL_TABLET | Freq: Every day | ORAL | Status: DC

## 2014-04-07 MED ORDER — LISINOPRIL-HYDROCHLOROTHIAZIDE 20-12.5 MG PO TABS: 1.0000 | ORAL_TABLET | Freq: Every day | ORAL | Status: DC

## 2014-04-09 ENCOUNTER — Ambulatory Visit (INDEPENDENT_AMBULATORY_CARE_PROVIDER_SITE_OTHER): Payer: BLUE CROSS/BLUE SHIELD | Admitting: *Deleted

## 2014-04-09 DIAGNOSIS — Z7901 Long term (current) use of anticoagulants: Secondary | ICD-10-CM

## 2014-04-09 DIAGNOSIS — Z86711 Personal history of pulmonary embolism: Secondary | ICD-10-CM | POA: Diagnosis not present

## 2014-04-09 LAB — POCT INR: INR: 2.2

## 2014-04-14 ENCOUNTER — Other Ambulatory Visit: Payer: Self-pay | Admitting: Family Medicine

## 2014-04-14 DIAGNOSIS — E031 Congenital hypothyroidism without goiter: Secondary | ICD-10-CM

## 2014-05-14 ENCOUNTER — Ambulatory Visit (INDEPENDENT_AMBULATORY_CARE_PROVIDER_SITE_OTHER): Payer: BLUE CROSS/BLUE SHIELD | Admitting: *Deleted

## 2014-05-14 DIAGNOSIS — I2699 Other pulmonary embolism without acute cor pulmonale: Secondary | ICD-10-CM | POA: Diagnosis not present

## 2014-05-14 DIAGNOSIS — Z7901 Long term (current) use of anticoagulants: Secondary | ICD-10-CM

## 2014-05-14 DIAGNOSIS — Z86711 Personal history of pulmonary embolism: Secondary | ICD-10-CM

## 2014-05-14 LAB — POCT INR: INR: 3.2

## 2014-06-11 ENCOUNTER — Ambulatory Visit (INDEPENDENT_AMBULATORY_CARE_PROVIDER_SITE_OTHER): Payer: BLUE CROSS/BLUE SHIELD | Admitting: *Deleted

## 2014-06-11 DIAGNOSIS — Z86711 Personal history of pulmonary embolism: Secondary | ICD-10-CM

## 2014-06-11 DIAGNOSIS — Z7901 Long term (current) use of anticoagulants: Secondary | ICD-10-CM | POA: Diagnosis not present

## 2014-06-11 LAB — POCT INR: INR: 2.6

## 2014-06-18 ENCOUNTER — Telehealth: Payer: Self-pay | Admitting: *Deleted

## 2014-06-18 NOTE — Telephone Encounter (Signed)
LMOVM for pt to call back and scheduled appt with pcp for diabetes. [name], CMA

## 2014-07-01 ENCOUNTER — Other Ambulatory Visit: Payer: Self-pay | Admitting: Family Medicine

## 2014-07-01 DIAGNOSIS — I2699 Other pulmonary embolism without acute cor pulmonale: Secondary | ICD-10-CM

## 2014-07-12 ENCOUNTER — Other Ambulatory Visit: Payer: Self-pay | Admitting: Family Medicine

## 2014-07-12 DIAGNOSIS — J4530 Mild persistent asthma, uncomplicated: Secondary | ICD-10-CM

## 2014-07-16 ENCOUNTER — Ambulatory Visit (INDEPENDENT_AMBULATORY_CARE_PROVIDER_SITE_OTHER): Payer: BLUE CROSS/BLUE SHIELD | Admitting: *Deleted

## 2014-07-16 DIAGNOSIS — Z7901 Long term (current) use of anticoagulants: Secondary | ICD-10-CM

## 2014-07-16 DIAGNOSIS — Z86711 Personal history of pulmonary embolism: Secondary | ICD-10-CM | POA: Diagnosis not present

## 2014-07-16 LAB — POCT INR: INR: 2.2

## 2014-08-20 ENCOUNTER — Ambulatory Visit (INDEPENDENT_AMBULATORY_CARE_PROVIDER_SITE_OTHER): Payer: BLUE CROSS/BLUE SHIELD | Admitting: *Deleted

## 2014-08-20 DIAGNOSIS — Z86711 Personal history of pulmonary embolism: Secondary | ICD-10-CM | POA: Diagnosis not present

## 2014-08-20 DIAGNOSIS — Z7901 Long term (current) use of anticoagulants: Secondary | ICD-10-CM

## 2014-08-20 LAB — POCT INR: INR: 2.5

## 2014-08-24 ENCOUNTER — Other Ambulatory Visit: Payer: Self-pay | Admitting: *Deleted

## 2014-08-24 DIAGNOSIS — F411 Generalized anxiety disorder: Secondary | ICD-10-CM

## 2014-09-13 ENCOUNTER — Other Ambulatory Visit: Payer: Self-pay | Admitting: Family Medicine

## 2014-09-13 DIAGNOSIS — E119 Type 2 diabetes mellitus without complications: Secondary | ICD-10-CM

## 2014-09-22 ENCOUNTER — Ambulatory Visit (INDEPENDENT_AMBULATORY_CARE_PROVIDER_SITE_OTHER): Payer: BLUE CROSS/BLUE SHIELD | Admitting: *Deleted

## 2014-09-22 ENCOUNTER — Encounter: Payer: Self-pay | Admitting: Family Medicine

## 2014-09-22 ENCOUNTER — Ambulatory Visit (INDEPENDENT_AMBULATORY_CARE_PROVIDER_SITE_OTHER): Payer: BLUE CROSS/BLUE SHIELD | Admitting: Family Medicine

## 2014-09-22 VITALS — BP 122/70 | HR 102 | Temp 98.7°F | Ht 66.0 in | Wt 255.0 lb

## 2014-09-22 DIAGNOSIS — E118 Type 2 diabetes mellitus with unspecified complications: Secondary | ICD-10-CM | POA: Diagnosis not present

## 2014-09-22 DIAGNOSIS — Z86711 Personal history of pulmonary embolism: Secondary | ICD-10-CM

## 2014-09-22 DIAGNOSIS — Z7901 Long term (current) use of anticoagulants: Secondary | ICD-10-CM

## 2014-09-22 DIAGNOSIS — F411 Generalized anxiety disorder: Secondary | ICD-10-CM | POA: Diagnosis not present

## 2014-09-22 LAB — POCT INR: INR: 2.9

## 2014-09-22 LAB — POCT GLYCOSYLATED HEMOGLOBIN (HGB A1C): HEMOGLOBIN A1C: 6.7

## 2014-09-22 MED ORDER — ALPRAZOLAM 0.5 MG PO TABS
0.5000 mg | ORAL_TABLET | Freq: Three times a day (TID) | ORAL | Status: DC | PRN
Start: 2014-09-22 — End: 2015-07-22

## 2014-09-22 NOTE — Assessment & Plan Note (Signed)
Anxiety - may have PTSD and panic components, controlled for 20+ years with xanax and doing well - refill xanax

## 2014-09-22 NOTE — Assessment & Plan Note (Signed)
Well controlled at 6.7 today - foot exam done today - continue metformin and glyburide - f/u in 3 months

## 2014-09-22 NOTE — Patient Instructions (Signed)
Alprazolam tablets What is this medicine? ALPRAZOLAM (al PRAY zoe lam) is a benzodiazepine. It is used to treat anxiety and panic attacks. This medicine may be used for other purposes; ask your health care provider or pharmacist if you have questions. COMMON BRAND NAME(S): Xanax What should I tell my health care provider before I take this medicine? They need to know if you have any of these conditions: -an alcohol or drug abuse problem -bipolar disorder, depression, psychosis or other mental health conditions -glaucoma -kidney or liver disease -lung or breathing disease -myasthenia gravis -Parkinson's disease -porphyria -seizures or a history of seizures -suicidal thoughts -an unusual or allergic reaction to alprazolam, other benzodiazepines, foods, dyes, or preservatives -pregnant or trying to get pregnant -breast-feeding How should I use this medicine? Take this medicine by mouth with a glass of water. Follow the directions on the prescription label. Take your medicine at regular intervals. Do not take it more often than directed. If you have been taking this medicine regularly for some time, do not suddenly stop taking it. You must gradually reduce the dose or you may get severe side effects. Ask your doctor or health care professional for advice. Even after you stop taking this medicine it can still affect your body for several days. Talk to your pediatrician regarding the use of this medicine in children. Special care may be needed. Overdosage: If you think you have taken too much of this medicine contact a poison control center or emergency room at once. NOTE: This medicine is only for you. Do not share this medicine with others. What if I miss a dose? If you miss a dose, take it as soon as you can. If it is almost time for your next dose, take only that dose. Do not take double or extra doses. What may interact with this medicine? Do not take this medicine with any of the  following medications: -certain medicines for HIV infection or AIDS -ketoconazole -itraconazole This medicine may also interact with the following medications: -birth control pills -certain macrolide antibiotics like clarithromycin, erythromycin, troleandomycin -cimetidine -cyclosporine -ergotamine -grapefruit juice -herbal or dietary supplements like kava kava, melatonin, dehydroepiandrosterone, DHEA, St. John's Wort or valerian -imatinib, STI-571 -isoniazid -levodopa -medicines for depression, anxiety, or psychotic disturbances -prescription pain medicines -rifampin, rifapentine, or rifabutin -some medicines for blood pressure or heart problems -some medicines for seizures like carbamazepine, oxcarbazepine, phenobarbital, phenytoin, primidone This list may not describe all possible interactions. Give your health care provider a list of all the medicines, herbs, non-prescription drugs, or dietary supplements you use. Also tell them if you smoke, drink alcohol, or use illegal drugs. Some items may interact with your medicine. What should I watch for while using this medicine? Visit your doctor or health care professional for regular checks on your progress. Your body can become dependent on this medicine. Ask your doctor or health care professional if you still need to take it. You may get drowsy or dizzy. Do not drive, use machinery, or do anything that needs mental alertness until you know how this medicine affects you. To reduce the risk of dizzy and fainting spells, do not stand or sit up quickly, especially if you are an older patient. Alcohol may increase dizziness and drowsiness. Avoid alcoholic drinks. Do not treat yourself for coughs, colds or allergies without asking your doctor or health care professional for advice. Some ingredients can increase possible side effects. What side effects may I notice from receiving this medicine? Side effects that you  should report to your doctor  or health care professional as soon as possible: -allergic reactions like skin rash, itching or hives, swelling of the face, lips, or tongue -confusion, forgetfulness -depression -difficulty sleeping -difficulty speaking -feeling faint or lightheaded, falls -mood changes, excitability or aggressive behavior -muscle cramps -trouble passing urine or change in the amount of urine -unusually weak or tired Side effects that usually do not require medical attention (report to your doctor or health care professional if they continue or are bothersome): -change in sex drive or performance -changes in appetite This list may not describe all possible side effects. Call your doctor for medical advice about side effects. You may report side effects to FDA at 1-800-FDA-1088. Where should I keep my medicine? Keep out of the reach of children. This medicine can be abused. Keep your medicine in a safe place to protect it from theft. Do not share this medicine with anyone. Selling or giving away this medicine is dangerous and against the law. Store at room temperature between 20 and 25 degrees C (68 and 77 degrees F). Throw away any unused medicine after the expiration date. NOTE: This sheet is a summary. It may not cover all possible information. If you have questions about this medicine, talk to your doctor, pharmacist, or health care provider.  2015, Elsevier/Gold Standard. (2007-06-12 10:34:46)
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
## 2014-09-24 ENCOUNTER — Ambulatory Visit: Payer: BLUE CROSS/BLUE SHIELD

## 2014-10-29 ENCOUNTER — Ambulatory Visit (INDEPENDENT_AMBULATORY_CARE_PROVIDER_SITE_OTHER): Payer: BLUE CROSS/BLUE SHIELD | Admitting: *Deleted

## 2014-10-29 DIAGNOSIS — Z86711 Personal history of pulmonary embolism: Secondary | ICD-10-CM | POA: Diagnosis not present

## 2014-10-29 DIAGNOSIS — Z7901 Long term (current) use of anticoagulants: Secondary | ICD-10-CM | POA: Diagnosis not present

## 2014-10-29 LAB — POCT INR: INR: 3.3

## 2014-11-26 ENCOUNTER — Ambulatory Visit (INDEPENDENT_AMBULATORY_CARE_PROVIDER_SITE_OTHER): Payer: BLUE CROSS/BLUE SHIELD | Admitting: *Deleted

## 2014-11-26 DIAGNOSIS — Z86711 Personal history of pulmonary embolism: Secondary | ICD-10-CM | POA: Diagnosis not present

## 2014-11-26 DIAGNOSIS — Z7901 Long term (current) use of anticoagulants: Secondary | ICD-10-CM

## 2014-11-26 LAB — POCT INR: INR: 3.1

## 2014-12-10 ENCOUNTER — Ambulatory Visit (INDEPENDENT_AMBULATORY_CARE_PROVIDER_SITE_OTHER): Payer: BLUE CROSS/BLUE SHIELD | Admitting: *Deleted

## 2014-12-10 DIAGNOSIS — Z7901 Long term (current) use of anticoagulants: Secondary | ICD-10-CM | POA: Diagnosis not present

## 2014-12-10 DIAGNOSIS — Z86711 Personal history of pulmonary embolism: Secondary | ICD-10-CM | POA: Diagnosis not present

## 2014-12-10 LAB — POCT INR: INR: 2.5

## 2015-01-07 ENCOUNTER — Ambulatory Visit (INDEPENDENT_AMBULATORY_CARE_PROVIDER_SITE_OTHER): Payer: BLUE CROSS/BLUE SHIELD | Admitting: *Deleted

## 2015-01-07 DIAGNOSIS — Z7901 Long term (current) use of anticoagulants: Secondary | ICD-10-CM | POA: Diagnosis not present

## 2015-01-07 DIAGNOSIS — Z86711 Personal history of pulmonary embolism: Secondary | ICD-10-CM

## 2015-01-07 LAB — POCT INR: INR: 3.4

## 2015-01-21 ENCOUNTER — Ambulatory Visit (INDEPENDENT_AMBULATORY_CARE_PROVIDER_SITE_OTHER): Payer: BLUE CROSS/BLUE SHIELD | Admitting: *Deleted

## 2015-01-21 DIAGNOSIS — Z7901 Long term (current) use of anticoagulants: Secondary | ICD-10-CM

## 2015-01-21 DIAGNOSIS — Z86711 Personal history of pulmonary embolism: Secondary | ICD-10-CM

## 2015-01-21 LAB — POCT INR: INR: 4.1

## 2015-01-28 ENCOUNTER — Ambulatory Visit (INDEPENDENT_AMBULATORY_CARE_PROVIDER_SITE_OTHER): Payer: BLUE CROSS/BLUE SHIELD | Admitting: *Deleted

## 2015-01-28 DIAGNOSIS — Z86711 Personal history of pulmonary embolism: Secondary | ICD-10-CM | POA: Diagnosis not present

## 2015-01-28 DIAGNOSIS — Z7901 Long term (current) use of anticoagulants: Secondary | ICD-10-CM

## 2015-01-28 LAB — POCT INR: INR: 2.3

## 2015-02-11 ENCOUNTER — Ambulatory Visit (INDEPENDENT_AMBULATORY_CARE_PROVIDER_SITE_OTHER): Payer: BLUE CROSS/BLUE SHIELD | Admitting: *Deleted

## 2015-02-11 DIAGNOSIS — Z7901 Long term (current) use of anticoagulants: Secondary | ICD-10-CM | POA: Diagnosis not present

## 2015-02-11 DIAGNOSIS — Z86711 Personal history of pulmonary embolism: Secondary | ICD-10-CM | POA: Diagnosis not present

## 2015-02-11 LAB — POCT INR: INR: 2.7

## 2015-03-11 ENCOUNTER — Ambulatory Visit (INDEPENDENT_AMBULATORY_CARE_PROVIDER_SITE_OTHER): Payer: BLUE CROSS/BLUE SHIELD | Admitting: *Deleted

## 2015-03-11 ENCOUNTER — Other Ambulatory Visit: Payer: Self-pay | Admitting: Family Medicine

## 2015-03-11 DIAGNOSIS — E119 Type 2 diabetes mellitus without complications: Secondary | ICD-10-CM

## 2015-03-11 DIAGNOSIS — I10 Essential (primary) hypertension: Secondary | ICD-10-CM

## 2015-03-11 DIAGNOSIS — Z7901 Long term (current) use of anticoagulants: Secondary | ICD-10-CM | POA: Diagnosis not present

## 2015-03-11 DIAGNOSIS — J4530 Mild persistent asthma, uncomplicated: Secondary | ICD-10-CM

## 2015-03-11 DIAGNOSIS — E785 Hyperlipidemia, unspecified: Secondary | ICD-10-CM

## 2015-03-11 LAB — POCT INR: INR: 2.2

## 2015-04-08 ENCOUNTER — Ambulatory Visit (INDEPENDENT_AMBULATORY_CARE_PROVIDER_SITE_OTHER): Payer: BLUE CROSS/BLUE SHIELD | Admitting: *Deleted

## 2015-04-08 DIAGNOSIS — Z7901 Long term (current) use of anticoagulants: Secondary | ICD-10-CM | POA: Diagnosis not present

## 2015-04-08 LAB — POCT INR: INR: 2.6

## 2015-04-15 ENCOUNTER — Other Ambulatory Visit: Payer: Self-pay | Admitting: Family Medicine

## 2015-04-15 DIAGNOSIS — E039 Hypothyroidism, unspecified: Secondary | ICD-10-CM

## 2015-05-13 ENCOUNTER — Ambulatory Visit (INDEPENDENT_AMBULATORY_CARE_PROVIDER_SITE_OTHER): Payer: BLUE CROSS/BLUE SHIELD | Admitting: *Deleted

## 2015-05-13 DIAGNOSIS — Z23 Encounter for immunization: Secondary | ICD-10-CM

## 2015-05-13 DIAGNOSIS — Z7901 Long term (current) use of anticoagulants: Secondary | ICD-10-CM | POA: Diagnosis not present

## 2015-05-13 LAB — POCT INR: INR: 3.2

## 2015-06-10 ENCOUNTER — Other Ambulatory Visit: Payer: Self-pay | Admitting: *Deleted

## 2015-06-10 ENCOUNTER — Ambulatory Visit (INDEPENDENT_AMBULATORY_CARE_PROVIDER_SITE_OTHER): Payer: BLUE CROSS/BLUE SHIELD | Admitting: *Deleted

## 2015-06-10 DIAGNOSIS — Z7901 Long term (current) use of anticoagulants: Secondary | ICD-10-CM | POA: Diagnosis not present

## 2015-06-10 DIAGNOSIS — I824Y9 Acute embolism and thrombosis of unspecified deep veins of unspecified proximal lower extremity: Secondary | ICD-10-CM

## 2015-06-10 LAB — POCT INR: INR: 2.9

## 2015-06-12 MED ORDER — WARFARIN SODIUM 4 MG PO TABS: ORAL_TABLET | ORAL | Status: DC

## 2015-07-08 ENCOUNTER — Telehealth: Payer: Self-pay | Admitting: *Deleted

## 2015-07-08 NOTE — Telephone Encounter (Signed)
Left message to return my call. Ms Allison [name] is scheduled to come in for a coumadin visit on 07/15/15 but we are closed for good Friday. We will need to reschedule her appointment. She specifically likes to come in on Friday, so if 07/22/15 is ok with her please reschedule her appointment for that date. Bus[place], Kevin Fenton

## 2015-07-15 ENCOUNTER — Ambulatory Visit: Payer: BLUE CROSS/BLUE SHIELD

## 2015-07-22 ENCOUNTER — Ambulatory Visit (INDEPENDENT_AMBULATORY_CARE_PROVIDER_SITE_OTHER): Payer: BLUE CROSS/BLUE SHIELD | Admitting: *Deleted

## 2015-07-22 ENCOUNTER — Other Ambulatory Visit: Payer: Self-pay | Admitting: Family Medicine

## 2015-07-22 DIAGNOSIS — Z86711 Personal history of pulmonary embolism: Secondary | ICD-10-CM | POA: Diagnosis not present

## 2015-07-22 DIAGNOSIS — Z7901 Long term (current) use of anticoagulants: Secondary | ICD-10-CM

## 2015-07-22 DIAGNOSIS — F411 Generalized anxiety disorder: Secondary | ICD-10-CM

## 2015-07-22 LAB — POCT INR: INR: 3

## 2015-07-22 MED ORDER — ALPRAZOLAM 0.5 MG PO TABS: 0.5000 mg | ORAL_TABLET | Freq: Three times a day (TID) | ORAL | Status: DC | PRN

## 2015-08-07 ENCOUNTER — Other Ambulatory Visit: Payer: Self-pay | Admitting: Family Medicine

## 2015-08-07 DIAGNOSIS — J4520 Mild intermittent asthma, uncomplicated: Secondary | ICD-10-CM

## 2015-08-26 ENCOUNTER — Ambulatory Visit (INDEPENDENT_AMBULATORY_CARE_PROVIDER_SITE_OTHER): Payer: BLUE CROSS/BLUE SHIELD | Admitting: *Deleted

## 2015-08-26 DIAGNOSIS — Z86711 Personal history of pulmonary embolism: Secondary | ICD-10-CM

## 2015-08-26 DIAGNOSIS — Z7901 Long term (current) use of anticoagulants: Secondary | ICD-10-CM | POA: Diagnosis not present

## 2015-08-26 LAB — POCT INR: INR: 3.4

## 2015-09-02 ENCOUNTER — Ambulatory Visit (INDEPENDENT_AMBULATORY_CARE_PROVIDER_SITE_OTHER): Payer: BLUE CROSS/BLUE SHIELD | Admitting: Family Medicine

## 2015-09-02 ENCOUNTER — Encounter: Payer: Self-pay | Admitting: Family Medicine

## 2015-09-02 VITALS — BP 115/75 | HR 100 | Temp 98.1°F | Wt 261.0 lb

## 2015-09-02 DIAGNOSIS — J209 Acute bronchitis, unspecified: Secondary | ICD-10-CM

## 2015-09-02 MED ORDER — FEXOFENADINE-PSEUDOEPHED ER 180-240 MG PO TB24: 1.0000 | ORAL_TABLET | Freq: Every day | ORAL | Status: DC

## 2015-09-02 MED ORDER — BENZONATATE 100 MG PO CAPS
100.0000 mg | ORAL_CAPSULE | Freq: Two times a day (BID) | ORAL | Status: DC | PRN
Start: 2015-09-02 — End: 2015-09-12

## 2015-09-02 MED ORDER — DOXYCYCLINE HYCLATE 100 MG PO TABS: 100.0000 mg | ORAL_TABLET | Freq: Two times a day (BID) | ORAL | Status: DC

## 2015-09-02 NOTE — Patient Instructions (Signed)
Nice to meet you today. Use Tessalon Perles as needed for cough. Take doxycycline twice a day for the next 10 days to treat bronchitis. You can use Allegra-D as a sinus decongestant from the next 1-2 weeks. If you're having increasing shortness of breath, tightness in her chest, or not improving please come back to see [place].  Take care, Dr. B  Acute Bronchitis Bronchitis is inflammation of the airways that extend from the windpipe into the lungs (bronchi). The inflammation often causes mucus to develop. This leads to a cough, which is the most common symptom of bronchitis.  In acute bronchitis, the condition usually develops suddenly and goes away over time, usually in a couple weeks. Smoking, allergies, and asthma can make bronchitis worse. Repeated episodes of bronchitis may cause further lung problems.  CAUSES Acute bronchitis is most often caused by the same virus that causes a cold. The virus can spread from person to person (contagious) through coughing, sneezing, and touching contaminated objects. SIGNS AND SYMPTOMS   Cough.   Fever.   Coughing up mucus.   Body aches.   Chest congestion.   Chills.   Shortness of breath.   Sore throat.  DIAGNOSIS  Acute bronchitis is usually diagnosed through a physical exam. Your health care provider will also ask you questions about your medical history. Tests, such as chest X-rays, are sometimes done to rule out other conditions.  TREATMENT  Acute bronchitis usually goes away in a couple weeks. Oftentimes, no medical treatment is necessary. Medicines are sometimes given for relief of fever or cough. Antibiotic medicines are usually not needed but may be prescribed in certain situations. In some cases, an inhaler may be recommended to help reduce shortness of breath and control the cough. A cool mist vaporizer may also be used to help thin bronchial secretions and make it easier to clear the chest.  HOME CARE INSTRUCTIONS  Get plenty of  rest.   Drink enough fluids to keep your urine clear or pale yellow (unless you have a medical condition that requires fluid restriction). Increasing fluids may help thin your respiratory secretions (sputum) and reduce chest congestion, and it will prevent dehydration.   Take medicines only as directed by your health care provider.  If you were prescribed an antibiotic medicine, finish it all even if you start to feel better.  Avoid smoking and secondhand smoke. Exposure to cigarette smoke or irritating chemicals will make bronchitis worse. If you are a smoker, consider using nicotine gum or skin patches to help control withdrawal symptoms. Quitting smoking will help your lungs heal faster.   Reduce the chances of another bout of acute bronchitis by washing your hands frequently, avoiding people with cold symptoms, and trying not to touch your hands to your mouth, nose, or eyes.   Keep all follow-up visits as directed by your health care provider.  SEEK MEDICAL CARE IF: Your symptoms do not improve after 1 week of treatment.  SEEK IMMEDIATE MEDICAL CARE IF:  You develop an increased fever or chills.   You have chest pain.   You have severe shortness of breath.  You have bloody sputum.   You develop dehydration.  You faint or repeatedly feel like you are going to pass out.  You develop repeated vomiting.  You develop a severe headache. MAKE SURE YOU:   Understand these instructions.  Will watch your condition.  Will get help right away if you are not doing well or get worse.   This information  is not intended to replace advice given to you by your health care provider. Make sure you discuss any questions you have with your health care provider.   Document Released: 04/26/2004 Document Revised: 04/09/2014 Document Reviewed: 09/09/2012 Elsevier Interactive Patient Education [employer].

## 2015-09-02 NOTE — Progress Notes (Signed)
   Subjective:   Allison [name] is a 60 y.o. female with a history of HTN, PE, GERD, T2 DM, hypothyroidism, morbid obesity here for concern for sinus infection.  Cough - present for ~2wks - tried robitussin and another OTC cough medicine and doesn't seem to help - started with post-nasal drip and sore throat - now with wheezing and cough that's worse at night - headache with cough - has asthma - albuterol q6h isnt helping enough - no current SOB - this feels like previous bronchitis - subjective fevers early on, no longer - nonproductive - denies CP   Review of Systems:  Per HPI.   Social History: never smoker - passive exposure  Objective:  BP 115/75 mmHg  Pulse 100  Temp(Src) 98.1 F (36.7 C) (Oral)  Wt 261 lb (118.389 kg)  SpO2 97%  Gen:  60 y.o. female in NAD  HEENT: NCAT, MMM, EOMI, PERRL, anicteric sclerae, OP mildly erythematous, TMs clear b/l Neck: Supple, no LAD CV: RRR, no MRG Resp: Non-labored, rhonchorus breath sounds diffusely, faint expiratory wheeze, good air movement Ext: WWP, no edema MSK: No obvious deformities, gait slow Neuro: Alert and oriented, speech normal     Assessment & Plan:     ZAMARIA RUMMELL is a 60 y.o. female here for   Acute bronchitis History and exam consistent with acute bronchitis No fevers or focal lung findings to suggest pneumonia No profound wheezing or decrease in air movement to suggest asthma exacerbation Treat with doxycycline, Tessalon Perles, Allegra-D Discussed time course and reasons to return as in AVS Follow-up in one week if not improving    Virginia Crews, MD MPH PGY-2,  [facility] 09/02/2015  9:37 AM

## 2015-09-02 NOTE — Assessment & Plan Note (Signed)
History and exam consistent with acute bronchitis No fevers or focal lung findings to suggest pneumonia No profound wheezing or decrease in air movement to suggest asthma exacerbation Treat with doxycycline, Tessalon Perles, Allegra-D Discussed time course and reasons to return as in AVS Follow-up in one week if not improving

## 2015-09-08 ENCOUNTER — Emergency Department (HOSPITAL_COMMUNITY): Payer: BLUE CROSS/BLUE SHIELD

## 2015-09-08 ENCOUNTER — Encounter (HOSPITAL_COMMUNITY): Payer: Self-pay

## 2015-09-08 DIAGNOSIS — I10 Essential (primary) hypertension: Secondary | ICD-10-CM | POA: Insufficient documentation

## 2015-09-08 DIAGNOSIS — E119 Type 2 diabetes mellitus without complications: Secondary | ICD-10-CM | POA: Diagnosis not present

## 2015-09-08 DIAGNOSIS — Z8542 Personal history of malignant neoplasm of other parts of uterus: Secondary | ICD-10-CM | POA: Insufficient documentation

## 2015-09-08 DIAGNOSIS — Z792 Long term (current) use of antibiotics: Secondary | ICD-10-CM | POA: Diagnosis not present

## 2015-09-08 DIAGNOSIS — J45901 Unspecified asthma with (acute) exacerbation: Secondary | ICD-10-CM | POA: Insufficient documentation

## 2015-09-08 DIAGNOSIS — Z7984 Long term (current) use of oral hypoglycemic drugs: Secondary | ICD-10-CM | POA: Diagnosis not present

## 2015-09-08 DIAGNOSIS — R05 Cough: Secondary | ICD-10-CM | POA: Diagnosis present

## 2015-09-08 DIAGNOSIS — Z7901 Long term (current) use of anticoagulants: Secondary | ICD-10-CM | POA: Insufficient documentation

## 2015-09-08 LAB — CBC
HCT: 37.7 % (ref 36.0–46.0)
Hemoglobin: 12 g/dL (ref 12.0–15.0)
MCH: 28.2 pg (ref 26.0–34.0)
MCHC: 31.8 g/dL (ref 30.0–36.0)
MCV: 88.7 fL (ref 78.0–100.0)
PLATELETS: 232 10*3/uL (ref 150–400)
RBC: 4.25 MIL/uL (ref 3.87–5.11)
RDW: 14 % (ref 11.5–15.5)
WBC: 8.4 10*3/uL (ref 4.0–10.5)

## 2015-09-08 LAB — BASIC METABOLIC PANEL
Anion gap: 13 (ref 5–15)
BUN: 22 mg/dL — AB (ref 6–20)
CHLORIDE: 100 mmol/L — AB (ref 101–111)
CO2: 24 mmol/L (ref 22–32)
CREATININE: 1.44 mg/dL — AB (ref 0.44–1.00)
Calcium: 6.6 mg/dL — ABNORMAL LOW (ref 8.9–10.3)
GFR calc Af Amer: 45 mL/min — ABNORMAL LOW (ref >60)
GFR calc non Af Amer: 39 mL/min — ABNORMAL LOW (ref >60)
GLUCOSE: 117 mg/dL — AB (ref 65–99)
Potassium: 3.9 mmol/L (ref 3.5–5.1)
Sodium: 137 mmol/L (ref 135–145)

## 2015-09-08 LAB — PROTIME-INR
INR: 2.51 — AB (ref 0.00–1.49)
Prothrombin Time: 26.8 seconds — ABNORMAL HIGH (ref 11.6–15.2)

## 2015-09-08 LAB — I-STAT TROPONIN, ED: Troponin i, poc: 0 ng/mL (ref 0.00–0.08)

## 2015-09-08 NOTE — ED Notes (Signed)
Pt recently diagnosed with bronchitis and is here tonight with c/o SOB and pain to central chest that radiates to both her shoulders. She reports feeling very weak and also complains of a non-productive cough.

## 2015-09-09 ENCOUNTER — Emergency Department (HOSPITAL_COMMUNITY)
Admission: EM | Admit: 2015-09-09 | Discharge: 2015-09-09 | Disposition: A | Discharge status: Home / Self Care | Payer: BLUE CROSS/BLUE SHIELD | Attending: Emergency Medicine | Admitting: Emergency Medicine

## 2015-09-09 ENCOUNTER — Ambulatory Visit: Payer: BLUE CROSS/BLUE SHIELD | Admitting: Family Medicine

## 2015-09-09 ENCOUNTER — Other Ambulatory Visit: Payer: Self-pay | Admitting: *Deleted

## 2015-09-09 DIAGNOSIS — J40 Bronchitis, not specified as acute or chronic: Secondary | ICD-10-CM

## 2015-09-09 DIAGNOSIS — J45901 Unspecified asthma with (acute) exacerbation: Secondary | ICD-10-CM

## 2015-09-09 DIAGNOSIS — E119 Type 2 diabetes mellitus without complications: Secondary | ICD-10-CM

## 2015-09-09 MED ORDER — ALBUTEROL SULFATE (2.5 MG/3ML) 0.083% IN NEBU
5.0000 mg | INHALATION_SOLUTION | Freq: Once | RESPIRATORY_TRACT | Status: AC
Start: 2015-09-09 — End: 2015-09-09
  Administered 2015-09-09: 5 mg via RESPIRATORY_TRACT
  Filled 2015-09-09: qty 6

## 2015-09-09 MED ORDER — IPRATROPIUM BROMIDE 0.02 % IN SOLN
0.5000 mg | Freq: Once | RESPIRATORY_TRACT | Status: AC
  Administered 2015-09-09: 0.5 mg via RESPIRATORY_TRACT
  Filled 2015-09-09: qty 2.5

## 2015-09-09 MED ORDER — ALBUTEROL SULFATE (2.5 MG/3ML) 0.083% IN NEBU
5.0000 mg | INHALATION_SOLUTION | Freq: Once | RESPIRATORY_TRACT | Status: AC
  Administered 2015-09-09: 5 mg via RESPIRATORY_TRACT
  Filled 2015-09-09: qty 6

## 2015-09-09 MED ORDER — PREDNISONE 20 MG PO TABS
60.0000 mg | ORAL_TABLET | Freq: Once | ORAL | Status: AC
  Administered 2015-09-09: 60 mg via ORAL
  Filled 2015-09-09: qty 3

## 2015-09-09 MED ORDER — PREDNISONE 20 MG PO TABS
40.0000 mg | ORAL_TABLET | Freq: Every day | ORAL | Status: DC
Start: 2015-09-09 — End: 2015-09-16

## 2015-09-09 NOTE — Discharge Instructions (Signed)

## 2015-09-09 NOTE — ED Provider Notes (Addendum)
CSN: [id number]     Arrival date & time 09/08/15  2206 History  By signing my name below, I, Dora Sims, attest that this documentation has been prepared under the direction and in the presence of physician practitioner, Blanchie Dessert, MD. Electronically Signed: Dora Sims, Scribe. 09/09/2015. 2:34 AM.   Chief Complaint  Patient presents with  . Chest Pain    The history is provided by the patient. No language interpreter was used.     HPI Comments: Allison [name] is a 60 y.o. female with h/o pulmonary embolus, asthma, environmental allergies, and DM who presents to the Emergency Department complaining of sudden onset, constant, worsening, general malaise, weakness, and dry cough for the last two weeks. She states that she has experienced decreased appetite recently. Pt notes associated SOB, chest pain, chest tightness, and wheezing over the last few days. She was diagnosed with bronchitis recently and was prescribed doxycycline. Pt has been using ProAir for her symptoms with no relief recently; she states that this treatment usually provides relief of her breathing issues. Pt has had to receive a nebulizer treatment in the hospital in the past for asthma. Pt notes that her current symptoms initially began with sinus drainage due to allergies. She endorses chest pain exacerbation with coughing. She denies fever, vomiting, leg swelling, or any other associated symptoms. Pt does not use steroid medications currently.   Past Medical History  Diagnosis Date  . Pulmonary embolus ([place])   . Uterine cancer ([place])   . Thyroid disease   . Anxiety   . Diabetes mellitus   . Hyperlipidemia   . Hypertension    Past Surgical History  Procedure Laterality Date  . Thyroid surgery    . Abdominal hysterectomy      TAHBSO   Family History  Problem Relation Age of Onset  . Adopted: Yes   Social History  Substance Use Topics  . Smoking status: Never Smoker   . Smokeless tobacco: Never Used   . Alcohol Use: No   OB History    No data available     Review of Systems  A complete 10 system review of systems was obtained and all systems are negative except as noted in the HPI and PMH.   Allergies  Oxycodone; Simvastatin; and Tramadol hcl  Home Medications   Prior to Admission medications   Medication Sig Start Date End Date Taking? Authorizing Provider  ALPRAZolam Duanne Moron) 0.5 MG tablet Take 1 tablet (0.5 mg total) by mouth 3 (three) times daily as needed for anxiety. 07/22/15   Frazier Richards, MD  benzonatate (TESSALON) 100 MG capsule Take 1 capsule (100 mg total) by mouth 2 (two) times daily as needed for cough. 09/02/15   Virginia Crews, MD  calcium-vitamin D (OSCAL WITH D) 500-200 MG-UNIT per tablet Take 1 tablet by mouth 2 (two) times daily.    Historical Provider, MD  cetirizine (ZYRTEC) 10 MG tablet Take 10 mg by mouth daily.      Historical Provider, MD  doxycycline (VIBRA-TABS) 100 MG tablet Take 1 tablet (100 mg total) by mouth 2 (two) times daily. 09/02/15   Virginia Crews, MD  fexofenadine-pseudoephedrine (ALLEGRA-D 24) 180-240 MG 24 hr tablet Take 1 tablet by mouth daily. 09/02/15   Virginia Crews, MD  fluconazole (DIFLUCAN) 150 MG tablet Take 1 tablet (150 mg total) by mouth once. Take 2nd pill in 2 days if needed 03/09/13   Frazier Richards, MD  fluticasone [facility]) 50 MCG/ACT nasal  spray Place 2 sprays into the nose daily. 09/02/12   Jacquelyn A McGill, MD  glyBURIDE (DIABETA) 5 MG tablet TAKE 1 TABLET TWICE A DAY WITH MEALS 09/13/14   Frazier Richards, MD  levothyroxine (SYNTHROID, LEVOTHROID) 125 MCG tablet TAKE 1 TABLET DAILY BEFORE BREAKFAST 04/15/15   Frazier Richards, MD  lisinopril-hydrochlorothiazide (PRINZIDE,ZESTORETIC) 20-12.5 MG tablet TAKE 1 TABLET DAILY 03/11/15   Frazier Richards, MD  metFORMIN (GLUCOPHAGE) 1000 MG tablet TAKE 1 TABLET TWICE A DAY WITH MEALS 03/11/15   Frazier Richards, MD  montelukast (SINGULAIR) 10 MG tablet TAKE 1 TABLET AT BEDTIME 03/11/15    Frazier Richards, MD  mupirocin ointment (BACTROBAN) 2 % Apply topically 3 (three) times daily. 11/07/12   Willeen Niece, MD  potassium citrate (UROCIT-K) 10 MEQ (1080 MG) SR tablet Take 1 tablet (10 mEq total) by mouth 3 (three) times daily with meals. 11/24/12   Frazier Richards, MD  pravastatin (PRAVACHOL) 40 MG tablet TAKE 1 TABLET DAILY 03/11/15   Frazier Richards, MD  PROAIR HFA 108 [phone] Base) MCG/ACT inhaler USE 2 INHALATIONS EVERY 6 HOURS AS NEEDED FOR WHEEZING OR SHORTNESS OF BREATH 08/08/15   Frazier Richards, MD  verapamil (CALAN-SR) 120 MG CR tablet TAKE 1 TABLET AT BEDTIME 03/11/15   Frazier Richards, MD  warfarin (COUMADIN) 2 MG tablet Take as directed in schedule from [place]. May take 2 of the 2mg  tabs where Horris Latino instructed you to take 1 of the 4mg . 03/16/13   Frazier Richards, MD  warfarin (COUMADIN) 4 MG tablet Take as directed 06/12/15   Frazier Richards, MD  warfarin (COUMADIN) 5 MG tablet TAKE 1 TABLET AS DIRECTED 07/01/14   Frazier Richards, MD   BP 107/61 mmHg  Pulse 114  Temp(Src) 98.2 F (36.8 C) (Oral)  Resp 16  SpO2 97% Physical Exam  Constitutional: She is oriented to person, place, and time. She appears well-developed and well-nourished. No distress.  HENT:  Head: Normocephalic and atraumatic.  Eyes: Conjunctivae and EOM are normal.  Neck: Neck supple. No tracheal deviation present.  Cardiovascular: Normal rate, regular rhythm and normal heart sounds.   Pulmonary/Chest: Effort normal and breath sounds normal. No respiratory distress.  Decreased breath sounds in the bilateral lower lung fields, no significant wheezing.  Musculoskeletal: Normal range of motion.  Neurological: She is alert and oriented to person, place, and time.  Skin: Skin is warm and dry.  Psychiatric: She has a normal mood and affect. Her behavior is normal.  Nursing note and vitals reviewed.   ED Course  Procedures (including critical care time)  DIAGNOSTIC STUDIES: Oxygen Saturation is 97% on RA, normal by my  interpretation.    COORDINATION OF CARE: 2:34 AM Discussed treatment plan with pt at bedside and pt agreed to plan.  Labs Review Labs Reviewed  BASIC METABOLIC PANEL - Abnormal; Notable for the following:    Chloride 100 (*)    Glucose, Bld 117 (*)    BUN 22 (*)    Creatinine, Ser 1.44 (*)    Calcium 6.6 (*)    GFR calc non Af Amer 39 (*)    GFR calc Af Amer 45 (*)    All other components within normal limits  PROTIME-INR - Abnormal; Notable for the following:    Prothrombin Time 26.8 (*)    INR 2.51 (*)    All other components within normal limits  CBC  I-STAT TROPOININ, ED    Imaging Review  Dg Chest 2 View  09/08/2015  CLINICAL DATA:  Chest pain, left-sided EXAM: CHEST  2 VIEW COMPARISON:  05/02/2012 FINDINGS: Normal heart size and mediastinal contours. Thyroidectomy clips. No acute infiltrate or edema. No effusion or pneumothorax. No acute osseous findings. Prominent spondylosis. IMPRESSION: No evidence of active disease. Electronically Signed   By: Monte Fantasia M.D.   On: 09/08/2015 22:59   I have personally reviewed and evaluated these images and lab results as part of my medical decision-making.   EKG Interpretation   Date/Time:  Thursday September 08 2015 22:14:11 EDT Ventricular Rate:  116 PR Interval:  132 QRS Duration: 72 QT Interval:  350 QTC Calculation: 486 R Axis:   -54 Text Interpretation:  duplicate discard Confirmed by [facility]  MD, DAVID  ([id number]) on 09/08/2015 10:16:35 PM      MDM   Final diagnoses:  Bronchitis  Asthma exacerbation    Pt with symptoms consistent with bronchitis vs asthma exacerbation.  Well appearing here but c/o of SOB at home and albuterol is not helping.  No signs of breathing difficulty but decreased breath sounds on exam.  No wheezing.  No signs of pharyngitis, otitis or abnormal abdominal findings.  Patient has started on doxycycline for concern for bronchitis but is not helping. Patient states all this started with sinus drainage  and is concerned that it may be allergy related. She denies any swelling or chest pain concerning for ACS or CHF.  She does have a prior history of PE but she is currently on Coumadin and INR is therapeutic at 2.5. EKG showed sinus tachycardia today without acute findings. Chest x-ray is within normal limits.   CBC within normal limits. BMP with chronic renal insufficiency but unchanged from prior. Troponin is negative.  Will treat patient with albuterol, Atrovent and prednisone.  4:51 AM On reevaluation patient is now wheezing diffusely. She was given a second treatment.  5:48 AM Wheezing resolved after second treatment. Will ambulate patient to ensure she is stable.  6:24 AM Able to ambulate and feeling better.  D/ced with prednisone and has plenty of albuterol at home.  I personally performed the services described in this documentation, which was scribed in my presence.  The recorded information has been reviewed and considered.   Blanchie Dessert, MD 09/09/15 [id number]  Blanchie Dessert, MD 09/09/15 [phone]

## 2015-09-12 ENCOUNTER — Ambulatory Visit (INDEPENDENT_AMBULATORY_CARE_PROVIDER_SITE_OTHER): Payer: BLUE CROSS/BLUE SHIELD | Admitting: Family Medicine

## 2015-09-12 ENCOUNTER — Encounter: Payer: Self-pay | Admitting: Family Medicine

## 2015-09-12 VITALS — BP 147/57 | HR 77 | Temp 98.4°F | Wt 257.0 lb

## 2015-09-12 DIAGNOSIS — N183 Chronic kidney disease, stage 3 unspecified: Secondary | ICD-10-CM | POA: Insufficient documentation

## 2015-09-12 DIAGNOSIS — J209 Acute bronchitis, unspecified: Secondary | ICD-10-CM

## 2015-09-12 DIAGNOSIS — J4541 Moderate persistent asthma with (acute) exacerbation: Secondary | ICD-10-CM | POA: Diagnosis not present

## 2015-09-12 DIAGNOSIS — E118 Type 2 diabetes mellitus with unspecified complications: Secondary | ICD-10-CM | POA: Diagnosis not present

## 2015-09-12 LAB — POCT GLYCOSYLATED HEMOGLOBIN (HGB A1C): Hemoglobin A1C: 6.7

## 2015-09-12 MED ORDER — BENZONATATE 100 MG PO CAPS: 100.0000 mg | ORAL_CAPSULE | Freq: Three times a day (TID) | ORAL | Status: DC | PRN

## 2015-09-12 MED ORDER — GLYBURIDE 5 MG PO TABS
5.0000 mg | ORAL_TABLET | Freq: Two times a day (BID) | ORAL | Status: DC
Start: 2015-09-12 — End: 2015-10-05

## 2015-09-12 MED ORDER — LEVOFLOXACIN 250 MG PO TABS: ORAL_TABLET | ORAL | Status: DC

## 2015-09-12 MED ORDER — HYDROCODONE-HOMATROPINE 5-1.5 MG/5ML PO SYRP: 5.0000 mL | ORAL_SOLUTION | Freq: Three times a day (TID) | ORAL | Status: DC | PRN

## 2015-09-12 NOTE — Assessment & Plan Note (Addendum)
A1c well controlled at 6.8 - hold glyburide while on levaquin to prevent hypoglycemia - resume next week - continue metformin

## 2015-09-12 NOTE — Progress Notes (Signed)
   Subjective:   Allison [name] is a 60 y.o. female with a history of HTN, PE, GERD, T2 DM, hypothyroidism, morbid obesity here for concern for f/u bronchitis  Cough - present for 3wks - tried robitussin and another OTC cough medicine and doesn't seem to help - placed on doxy on 6/2 and prednisone on 6/9, no improvement on either - started with post-nasal drip and sore throat - now with wheezing and cough that's worse at night - has asthma - albuterol q6h isnt helping enough - no current SOB - feels tired all the time and doesn't want to go on planned vacation Saturday because she feels so unwell - this feels like previous bronchitis - subjective fevers early on, no longer - nonproductive - denies CP but does report dull aching in her lungs, L>R - no palpitation  CHRONIC DIABETES  Disease Monitoring  Blood Sugar Ranges: 100-150  Polyuria: no   Visual problems: no   Medication Compliance: yes  Medication Side Effects  Hypoglycemia: no   Preventitive Health Care  Eye Exam: due, pt to schedule  Foot Exam: due next visit  Diet pattern: tid, balanced  Exercise: minimal   Review of Systems:  Per HPI.   Social History: never smoker - passive exposure  Objective:  BP 147/57 mmHg  Pulse 77  Temp(Src) 98.4 F (36.9 C) (Oral)  Wt 257 lb (116.574 kg)  SpO2 97%  Gen:  60 y.o. female in NAD  HEENT: NCAT, MMM, EOMI, PERRL, anicteric sclerae, OP mildly erythematous, TMs clear b/l Neck: Supple, no LAD CV: RRR, no MRG Resp: Non-labored, rhonchorus breath sounds diffusely, decreased breath sounds L middle and lower lung fields, faint expiratory wheezes Ext: WWP, no edema Neuro: Alert and oriented, speech normal     Assessment & Plan:     Allison [name] is a 60 y.o. female here for cough  Asthma 3 weeks of cough and wheezing, not improved by doxy or prednisone per her, decreased breath sounds LLL, neg cxr Friday in ED - strongly suspect pneumonia despite recent  negative xray, doubt PE given vitals stable and therapeutic on coumadin - will treat with levaquin as already failed doxy - hold glyburide and follow inr closely while on this - hycodan and tessalon for symptom relief - f/u in 3 days  Type II diabetes mellitus with complication [id number] well controlled at 6.8 - hold glyburide while on levaquin to prevent hypoglycemia - resume next week - continue metformin    Frazier Richards, MD MPH PGY-3,  [facility] 09/12/2015  10:42 AM

## 2015-09-12 NOTE — Assessment & Plan Note (Addendum)
3 weeks of cough and wheezing, not improved by doxy or prednisone per her, decreased breath sounds LLL, neg cxr Friday in ED - strongly suspect pneumonia despite recent negative xray, doubt PE given vitals stable and therapeutic on coumadin - will treat with levaquin as already failed doxy - hold glyburide and follow inr closely while on this - hycodan and tessalon for symptom relief - f/u in 3 days

## 2015-09-12 NOTE — Patient Instructions (Addendum)
While you are on the antibiotic levaquin, do not take your glyburide, you can restart it after you finish the antibiotic.  I would also like you to come get your INR checked on Thursday or Friday since it can affect your coumadin dosing and I want to see you again before you leave for vacation.

## 2015-09-16 ENCOUNTER — Encounter: Payer: Self-pay | Admitting: Family Medicine

## 2015-09-16 ENCOUNTER — Ambulatory Visit: Payer: BLUE CROSS/BLUE SHIELD | Admitting: Family Medicine

## 2015-09-16 ENCOUNTER — Ambulatory Visit (INDEPENDENT_AMBULATORY_CARE_PROVIDER_SITE_OTHER): Payer: BLUE CROSS/BLUE SHIELD | Admitting: *Deleted

## 2015-09-16 ENCOUNTER — Ambulatory Visit (INDEPENDENT_AMBULATORY_CARE_PROVIDER_SITE_OTHER): Payer: BLUE CROSS/BLUE SHIELD | Admitting: Family Medicine

## 2015-09-16 ENCOUNTER — Ambulatory Visit: Payer: BLUE CROSS/BLUE SHIELD

## 2015-09-16 VITALS — BP 133/75 | HR 102 | Temp 98.6°F | Wt 258.0 lb

## 2015-09-16 DIAGNOSIS — J4531 Mild persistent asthma with (acute) exacerbation: Secondary | ICD-10-CM

## 2015-09-16 DIAGNOSIS — E039 Hypothyroidism, unspecified: Secondary | ICD-10-CM

## 2015-09-16 DIAGNOSIS — Z86711 Personal history of pulmonary embolism: Secondary | ICD-10-CM

## 2015-09-16 DIAGNOSIS — J209 Acute bronchitis, unspecified: Secondary | ICD-10-CM

## 2015-09-16 DIAGNOSIS — Z113 Encounter for screening for infections with a predominantly sexual mode of transmission: Secondary | ICD-10-CM

## 2015-09-16 DIAGNOSIS — I2782 Chronic pulmonary embolism: Secondary | ICD-10-CM

## 2015-09-16 DIAGNOSIS — Z7901 Long term (current) use of anticoagulants: Secondary | ICD-10-CM | POA: Diagnosis not present

## 2015-09-16 LAB — POCT INR: INR: 4.8

## 2015-09-16 LAB — CBC
HEMATOCRIT: 39.4 % (ref 35.0–45.0)
HEMOGLOBIN: 12.9 g/dL (ref 11.7–15.5)
MCH: 29 pg (ref 27.0–33.0)
MCHC: 32.7 g/dL (ref 32.0–36.0)
MCV: 88.5 fL (ref 80.0–100.0)
MPV: 9.7 fL (ref 7.5–12.5)
Platelets: 295 10*3/uL (ref 140–400)
RBC: 4.45 MIL/uL (ref 3.80–5.10)
RDW: 14.9 % (ref 11.0–15.0)
WBC: 12.2 10*3/uL — AB (ref 3.8–10.8)

## 2015-09-16 LAB — TSH: TSH: 0.62 mIU/L

## 2015-09-16 MED ORDER — PREDNISONE 20 MG PO TABS: ORAL_TABLET | ORAL | Status: DC

## 2015-09-17 LAB — COMPLETE METABOLIC PANEL WITH GFR
ALBUMIN: 4.2 g/dL (ref 3.6–5.1)
ALT: 11 U/L (ref 6–29)
AST: 11 U/L (ref 10–35)
Alkaline Phosphatase: 47 U/L (ref 33–130)
BILIRUBIN TOTAL: 0.5 mg/dL (ref 0.2–1.2)
BUN: 25 mg/dL (ref 7–25)
CALCIUM: 7.3 mg/dL — AB (ref 8.6–10.4)
CHLORIDE: 99 mmol/L (ref 98–110)
CO2: 28 mmol/L (ref 20–31)
CREATININE: 1.22 mg/dL — AB (ref 0.50–1.05)
GFR, EST AFRICAN AMERICAN: 56 mL/min — AB (ref >=60)
GFR, Est Non African American: 49 mL/min — ABNORMAL LOW (ref >=60)
Glucose, Bld: 102 mg/dL — ABNORMAL HIGH (ref 65–99)
Potassium: 3.6 mmol/L (ref 3.5–5.3)
Sodium: 143 mmol/L (ref 135–146)
TOTAL PROTEIN: 7.2 g/dL (ref 6.1–8.1)

## 2015-09-17 LAB — HEPATITIS C ANTIBODY: HCV Ab: NEGATIVE

## 2015-09-19 LAB — HIV ANTIBODY (ROUTINE TESTING W REFLEX): HIV 1&2 Ab, 4th Generation: NONREACTIVE

## 2015-09-20 NOTE — Assessment & Plan Note (Signed)
4 weeks of cough and wheezing, some improvement on levaquin but still lingering, CTAB, neg cxr in ED last week - doubt PE given vitals stable and therapeutic on coumadin - continue levaquin - start longer prednisone taper, pt going on vacation next week - cont tessalon for symptom relief - f/u when she returns from vacation or to urgent care if she worsens

## 2015-09-20 NOTE — Progress Notes (Signed)
   Subjective:   Allison [name] is a 60 y.o. female with a history of HTN, PE, GERD, T2 DM, hypothyroidism, morbid obesity here for concern for f/u bronchitis  Cough - present for 4wks - tried robitussin and another OTC cough medicine and doesn't seem to help - placed on doxy on 6/2 and prednisone on 6/9, no improvement on either - started levaquin on 6/12 and some improvement with this - started with post-nasal drip and sore throat - now with wheezing and cough that's worse at night - has asthma - albuterol q6h isnt helping enough - no current SOB - this feels like previous bronchitis - subjective fevers early on, no longer - nonproductive - denies CP  - no palpitation  Review of Systems:  Per HPI.   Social History: never smoker - passive exposure  Objective:  BP 133/75 mmHg  Pulse 102  Temp(Src) 98.6 F (37 C) (Oral)  Wt 258 lb (117.028 kg)  SpO2 98%  Gen:  60 y.o. female in NAD  HEENT: NCAT, MMM, EOMI, PERRL, anicteric sclerae, OP mildly erythematous Neck: Supple, no LAD CV: RRR, no MRG Resp: Non-labored, rhonchorus breath sounds diffusely, faint expiratory wheezes, good air movement throughout Ext: WWP, no edema Neuro: Alert and oriented, speech normal     Assessment & Plan:     Allison [name] is a 60 y.o. female here for f/u cough  Asthma 4 weeks of cough and wheezing, some improvement on levaquin but still lingering, CTAB, neg cxr in ED last week - doubt PE given vitals stable and therapeutic on coumadin - continue levaquin - start longer prednisone taper, pt going on vacation next week - cont tessalon for symptom relief - f/u when she returns from vacation or to urgent care if she worsens      Frazier Richards, MD MPH PGY-3,  [facility] 09/20/2015  7:44 AM

## 2015-09-29 ENCOUNTER — Telehealth: Payer: Self-pay | Admitting: Family Medicine

## 2015-09-29 ENCOUNTER — Ambulatory Visit (INDEPENDENT_AMBULATORY_CARE_PROVIDER_SITE_OTHER): Payer: BLUE CROSS/BLUE SHIELD | Admitting: *Deleted

## 2015-09-29 ENCOUNTER — Encounter: Payer: Self-pay | Admitting: Family Medicine

## 2015-09-29 ENCOUNTER — Ambulatory Visit (INDEPENDENT_AMBULATORY_CARE_PROVIDER_SITE_OTHER): Payer: BLUE CROSS/BLUE SHIELD | Admitting: Family Medicine

## 2015-09-29 VITALS — BP 139/71 | HR 112 | Temp 99.1°F | Ht 66.0 in | Wt 259.2 lb

## 2015-09-29 DIAGNOSIS — I2782 Chronic pulmonary embolism: Secondary | ICD-10-CM | POA: Diagnosis not present

## 2015-09-29 DIAGNOSIS — Z7901 Long term (current) use of anticoagulants: Secondary | ICD-10-CM

## 2015-09-29 DIAGNOSIS — B373 Candidiasis of vulva and vagina: Secondary | ICD-10-CM

## 2015-09-29 DIAGNOSIS — Z86711 Personal history of pulmonary embolism: Secondary | ICD-10-CM

## 2015-09-29 DIAGNOSIS — J209 Acute bronchitis, unspecified: Secondary | ICD-10-CM | POA: Diagnosis not present

## 2015-09-29 DIAGNOSIS — B3731 Acute candidiasis of vulva and vagina: Secondary | ICD-10-CM

## 2015-09-29 LAB — POCT INR: INR: 2

## 2015-09-29 MED ORDER — FLUCONAZOLE 150 MG PO TABS
150.0000 mg | ORAL_TABLET | Freq: Once | ORAL | Status: DC
Start: 2015-09-29 — End: 2015-11-04

## 2015-09-29 NOTE — Patient Instructions (Addendum)
I am so glad to see that you are finally feeling better. It has been a pleasure being your doctor for the past few years and I wish you all the best.

## 2015-09-29 NOTE — Telephone Encounter (Signed)
Just seen today but forgot to ask Dr. Sherril Cong about lab results from her [place] on 09/16/15. Please call patient.

## 2015-09-30 NOTE — Progress Notes (Signed)
   Subjective:   Allison [name] is a 60 y.o. female with a history of HTN, PE, GERD, T2 DM, hypothyroidism, morbid obesity here for concern for f/u bronchitis  Cough - present for 4wks, got better on the 25th and now completely resolved - tried robitussin and another OTC cough medicine and doesn't seem to help - placed on doxy on 6/2 and prednisone on 6/9, no improvement on either - started levaquin on 6/12 and some improvement with this - started longer pred course on 6/23, she reports this is what finally turned it around - started with post-nasal drip and sore throat - then developed wheezing and cough that's worse at night - has asthma - albuterol q6h isnt helping enough - no current SOB - nonproductive - denies CP  - no palpitation  Review of Systems:  Per HPI.   Social History: never smoker - passive exposure  Objective:  BP 139/71 mmHg  Pulse 112  Temp(Src) 99.1 F (37.3 C) (Oral)  Ht 5\' 6"  (1.676 m)  Wt 259 lb 3.2 oz (117.572 kg)  BMI 41.86 kg/m2  Gen:  60 y.o. female in NAD  HEENT: NCAT, MMM, anicteric sclerae, OP mildly erythematous Neck: Supple, no LAD CV: RRR, no MRG Resp: Non-labored, CTAB, good air movement throughout Ext: WWP, no edema Neuro: Alert and oriented, speech normal     Assessment & Plan:     Allison [name] is a 60 y.o. female here for f/u cough  Acute bronchitis Resolved, lungs clear  Yeast vaginitis Vaginal itching following abx and prednisone, reports always gets yeast after abx - rx diflucan x1, repeat in 3 days as needed - f/u if not improving with this    Allison Richards, MD MPH PGY-3,  [facility] 09/30/2015  4:01 PM

## 2015-09-30 NOTE — Assessment & Plan Note (Signed)
Resolved, lungs clear

## 2015-09-30 NOTE — Assessment & Plan Note (Signed)
Vaginal itching following abx and prednisone, reports always gets yeast after abx - rx diflucan x1, repeat in 3 days as needed - f/u if not improving with this

## 2015-09-30 NOTE — Telephone Encounter (Signed)
Returned call and informed patient of lab results. No concerning findings, all questions answered.

## 2015-10-05 ENCOUNTER — Telehealth: Payer: Self-pay | Admitting: *Deleted

## 2015-10-05 DIAGNOSIS — E119 Type 2 diabetes mellitus without complications: Secondary | ICD-10-CM

## 2015-10-05 MED ORDER — GLYBURIDE 5 MG PO TABS: 5.0000 mg | ORAL_TABLET | Freq: Two times a day (BID) | ORAL | Status: DC

## 2015-10-05 NOTE — Telephone Encounter (Signed)
Pt had requested that her glyburide be sent [facility] back in June.  Advised it was sent to [facility], but that I would resend to [facility] now. Allison [name], Allison [name], CMA

## 2015-10-14 ENCOUNTER — Ambulatory Visit (INDEPENDENT_AMBULATORY_CARE_PROVIDER_SITE_OTHER): Payer: BLUE CROSS/BLUE SHIELD | Admitting: *Deleted

## 2015-10-14 DIAGNOSIS — Z7901 Long term (current) use of anticoagulants: Secondary | ICD-10-CM | POA: Diagnosis not present

## 2015-10-14 DIAGNOSIS — I2782 Chronic pulmonary embolism: Secondary | ICD-10-CM

## 2015-10-14 LAB — POCT INR: INR: 4.1

## 2015-10-21 ENCOUNTER — Ambulatory Visit (INDEPENDENT_AMBULATORY_CARE_PROVIDER_SITE_OTHER): Payer: BLUE CROSS/BLUE SHIELD | Admitting: *Deleted

## 2015-10-21 DIAGNOSIS — Z7901 Long term (current) use of anticoagulants: Secondary | ICD-10-CM | POA: Diagnosis not present

## 2015-10-21 DIAGNOSIS — I2782 Chronic pulmonary embolism: Secondary | ICD-10-CM | POA: Diagnosis not present

## 2015-10-21 DIAGNOSIS — Z86711 Personal history of pulmonary embolism: Secondary | ICD-10-CM | POA: Diagnosis not present

## 2015-10-21 LAB — POCT INR: INR: 2.8

## 2015-11-04 ENCOUNTER — Ambulatory Visit (INDEPENDENT_AMBULATORY_CARE_PROVIDER_SITE_OTHER): Payer: BLUE CROSS/BLUE SHIELD | Admitting: Family Medicine

## 2015-11-04 ENCOUNTER — Ambulatory Visit (INDEPENDENT_AMBULATORY_CARE_PROVIDER_SITE_OTHER): Payer: BLUE CROSS/BLUE SHIELD | Admitting: *Deleted

## 2015-11-04 ENCOUNTER — Encounter: Payer: Self-pay | Admitting: Family Medicine

## 2015-11-04 VITALS — BP 127/73 | HR 112 | Temp 99.1°F | Ht 66.0 in | Wt 260.2 lb

## 2015-11-04 DIAGNOSIS — Z7901 Long term (current) use of anticoagulants: Secondary | ICD-10-CM | POA: Diagnosis not present

## 2015-11-04 DIAGNOSIS — M79671 Pain in right foot: Secondary | ICD-10-CM | POA: Insufficient documentation

## 2015-11-04 DIAGNOSIS — Z86711 Personal history of pulmonary embolism: Secondary | ICD-10-CM

## 2015-11-04 DIAGNOSIS — M10071 Idiopathic gout, right ankle and foot: Secondary | ICD-10-CM | POA: Diagnosis not present

## 2015-11-04 DIAGNOSIS — M109 Gout, unspecified: Secondary | ICD-10-CM

## 2015-11-04 DIAGNOSIS — I2782 Chronic pulmonary embolism: Secondary | ICD-10-CM

## 2015-11-04 LAB — URIC ACID: Uric Acid, Serum: 9.5 mg/dL — ABNORMAL HIGH (ref 2.5–7.0)

## 2015-11-04 LAB — POCT INR: INR: 4.5

## 2015-11-04 MED ORDER — COLCHICINE 0.6 MG PO TABS: ORAL_TABLET | ORAL | 0 refills | Status: DC

## 2015-11-04 NOTE — Progress Notes (Signed)
Subjective: CC: gout HPI: Patient is a 60 y.o. female presenting to clinic today for a same day app with concerns for gout.  She noted pain in the R foot Sunday. The pain is in the ankle, across the dorsal aspect and lateral aspects of her foot. She notes that sometimes the pain travels into her toes.  A few days ago she noted mild erythema and swelling. She feels this is similar to her previous gout attack that with a long time ago, but more severe.   Her diagnosis of gout previously was questionable, however it was noted that she had significant improvement with colchicine.  The patient denies any trauma to the area. She denies any fevers or chills. There is no popping, clicking, or giving way of that right ankle.  Social History: never smoker   ROS: All other systems reviewed and are negative.  Past Medical History Patient Active Problem List   Diagnosis Date Noted  . Right foot pain 11/04/2015  . Yeast vaginitis 09/29/2015  . Chronic kidney disease (CKD), stage III (moderate) 09/12/2015  . Acute bronchitis 06/12/2013  . Axillary abscess 05/01/2013  . Uric acid renal calculus 11/24/2012  . Leg swelling 03/18/2012  . Nephrolithiasis 10/18/2011  . Preventive measure 09/04/2011  . Hypothyroid 10/10/2010  . Venous stasis dermatitis 09/26/2010  . Long term (current) use of anticoagulants 05/17/2010  . Asthma 01/03/2010  . PROTEINURIA 08/25/2009  . ADENOCARCINOMA, UTERUS 05/19/2009  . MORBID OBESITY 04/14/2009  . KNEE PAIN, BILATERAL 04/14/2009  . Chronic pulmonary embolism ([place]) 10/02/2008  . GOITER NOS 05/30/2006  . Type II diabetes mellitus with complication ([place]) [id number]  . HYPERLIPIDEMIA 05/30/2006  . Anxiety state 05/30/2006  . DEPRESSIVE DISORDER, NOS 05/30/2006  . HYPERTENSION, BENIGN SYSTEMIC 05/30/2006  . GERD 04/05/2006  . LOW BACK PAIN 04/05/2006    Medications- reviewed and updated  Objective: Office vital signs reviewed. BP 127/73 (BP Location:  Right Arm, Patient Position: Sitting, Cuff Size: Large)   Pulse (!) 112   Temp 99.1 F (37.3 C) (Oral)   Ht 5\' 6"  (1.676 m)   Wt 260 lb 3.2 oz (118 kg)   SpO2 97%   BMI 42.00 kg/m    Physical Examination:  General: Awake, alert, well- nourished, NAD Right foot: Mild rythema around the lateral aspect of the right foot. No apparent swelling. Tenderness to palpation at the dorsal aspect and lateral aspect of the foot, especially at the base of the 5th digit. No warmth noted. Full range of motion, pain with external rotation of the ankle. 2+DP pulse.   Assessment/Plan: Right foot pain The patient was received diagnosed with gout, however this was questionable diagnosis and the assessment and plan given her presentation at that time.  The patient's physical exam today is still not prototypical for an acute gout flare. No history of trauma. She has generalized tenderness, but no point tenderness. No warmth or fevers to suggest infection.  Given she is responded well to colchicine in the past, will try this again today. We'll obtain a uric acid as this has not been checked since 2010. I suspect it may be elevated given the patient has a propensity to form uric acid renal stones. Given the questionable diagnosis, I discussed strict return precautions with the patient. She knows to follow-up in our clinic if her symptoms do not improve or if they worsen.   Orders Placed This Encounter  Procedures  . Uric acid    Meds ordered this encounter  Medications  . DISCONTD: colchicine (COLCRYS) 0.6 MG tablet    Sig: Take 2 tablets (1.2mg ) now, then take 1 more tablet after 1 hr if pain has not resolved. Then take 1 tablet daily until pain resolved    Dispense:  15 tablet    Refill:  0  . colchicine (COLCRYS) 0.6 MG tablet    Sig: Take 2 tablets (1.2mg ) now, then take 1 more tablet after 1 hr if pain has not resolved. Then take 1 tablet daily until pain resolved    Dispense:  15 tablet    Refill:  [place] PGY-3, [place]

## 2015-11-04 NOTE — Assessment & Plan Note (Signed)
The patient was received diagnosed with gout, however this was questionable diagnosis and the assessment and plan given her presentation at that time.  The patient's physical exam today is still not prototypical for an acute gout flare. No history of trauma. She has generalized tenderness, but no point tenderness. No warmth or fevers to suggest infection.  Given she is responded well to colchicine in the past, will try this again today. We'll obtain a uric acid as this has not been checked since 2010. I suspect it may be elevated given the patient has a propensity to form uric acid renal stones. Given the questionable diagnosis, I discussed strict return precautions with the patient. She knows to follow-up in our clinic if her symptoms do not improve or if they worsen.

## 2015-11-04 NOTE — Patient Instructions (Signed)
Take 2 colchicine tablets now, then take one more in 1 hour if pain has not resolved. Continue with 1 tablet daily until pain resolved.  If your pain is not improving, follow up with [place] as needed. Make appropriate changes to your coumadin.   Gout Gout is an inflammatory arthritis caused by a buildup of uric acid crystals in the joints. Uric acid is a chemical that is normally present in the blood. When the level of uric acid in the blood is too high it can form crystals that deposit in your joints and tissues. This causes joint redness, soreness, and swelling (inflammation). Repeat attacks are common. Over time, uric acid crystals can form into masses (tophi) near a joint, destroying bone and causing disfigurement. Gout is treatable and often preventable. CAUSES  The disease begins with elevated levels of uric acid in the blood. Uric acid is produced by your body when it breaks down a naturally found substance called purines. Certain foods you eat, such as meats and fish, contain high amounts of purines. Causes of an elevated uric acid level include:  Being passed down from parent to child (heredity).  Diseases that cause increased uric acid production (such as obesity, psoriasis, and certain cancers).  Excessive alcohol use.  Diet, especially diets rich in meat and seafood.  Medicines, including certain cancer-fighting medicines (chemotherapy), water pills (diuretics), and aspirin.  Chronic kidney disease. The kidneys are no longer able to remove uric acid well.  Problems with metabolism. Conditions strongly associated with gout include:  Obesity.  High blood pressure.  High cholesterol.  Diabetes. Not everyone with elevated uric acid levels gets gout. It is not understood why some people get gout and others do not. Surgery, joint injury, and eating too much of certain foods are some of the factors that can lead to gout attacks. SYMPTOMS   An attack of gout comes on quickly. It  causes intense pain with redness, swelling, and warmth in a joint.  Fever can occur.  Often, only one joint is involved. Certain joints are more commonly involved:  Base of the big toe.  Knee.  Ankle.  Wrist.  Finger. Without treatment, an attack usually goes away in a few days to weeks. Between attacks, you usually will not have symptoms, which is different from many other forms of arthritis. DIAGNOSIS  Your caregiver will suspect gout based on your symptoms and exam. In some cases, tests may be recommended. The tests may include:  Blood tests.  Urine tests.  X-rays.  Joint fluid exam. This exam requires a needle to remove fluid from the joint (arthrocentesis). Using a microscope, gout is confirmed when uric acid crystals are seen in the joint fluid. TREATMENT  There are two phases to gout treatment: treating the sudden onset (acute) attack and preventing attacks (prophylaxis).  Treatment of an Acute Attack.  Medicines are used. These include anti-inflammatory medicines or steroid medicines.  An injection of steroid medicine into the affected joint is sometimes necessary.  The painful joint is rested. Movement can worsen the arthritis.  You may use warm or cold treatments on painful joints, depending which works best for you.  Treatment to Prevent Attacks.  If you suffer from frequent gout attacks, your caregiver may advise preventive medicine. These medicines are started after the acute attack subsides. These medicines either help your kidneys eliminate uric acid from your body or decrease your uric acid production. You may need to stay on these medicines for a very long time.  The early phase of treatment with preventive medicine can be associated with an increase in acute gout attacks. For this reason, during the first few months of treatment, your caregiver may also advise you to take medicines usually used for acute gout treatment. Be sure you understand your  caregiver's directions. Your caregiver may make several adjustments to your medicine dose before these medicines are effective.  Discuss dietary treatment with your caregiver or dietitian. Alcohol and drinks high in sugar and fructose and foods such as meat, poultry, and seafood can increase uric acid levels. Your caregiver or dietitian can advise you on drinks and foods that should be limited. HOME CARE INSTRUCTIONS   Do not take aspirin to relieve pain. This raises uric acid levels.  Only take over-the-counter or prescription medicines for pain, discomfort, or fever as directed by your caregiver.  Rest the joint as much as possible. When in bed, keep sheets and blankets off painful areas.  Keep the affected joint raised (elevated).  Apply warm or cold treatments to painful joints. Use of warm or cold treatments depends on which works best for you.  Use crutches if the painful joint is in your leg.  Drink enough fluids to keep your urine clear or pale yellow. This helps your body get rid of uric acid. Limit alcohol, sugary drinks, and fructose drinks.  Follow your dietary instructions. Pay careful attention to the amount of protein you eat. Your daily diet should emphasize fruits, vegetables, whole grains, and fat-free or low-fat milk products. Discuss the use of coffee, vitamin C, and cherries with your caregiver or dietitian. These may be helpful in lowering uric acid levels.  Maintain a healthy body weight. SEEK MEDICAL CARE IF:   You develop diarrhea, vomiting, or any side effects from medicines.  You do not feel better in 24 hours, or you are getting worse. SEEK IMMEDIATE MEDICAL CARE IF:   Your joint becomes suddenly more tender, and you have chills or a fever. MAKE SURE YOU:   Understand these instructions.  Will watch your condition.  Will get help right away if you are not doing well or get worse.   This information is not intended to replace advice given to you by  your health care provider. Make sure you discuss any questions you have with your health care provider.   Document Released: 03/16/2000 Document Revised: 04/09/2014 Document Reviewed: 10/31/2011 Elsevier Interactive Patient Education [employer].

## 2015-11-14 ENCOUNTER — Ambulatory Visit (INDEPENDENT_AMBULATORY_CARE_PROVIDER_SITE_OTHER): Payer: BLUE CROSS/BLUE SHIELD | Admitting: *Deleted

## 2015-11-14 DIAGNOSIS — I2782 Chronic pulmonary embolism: Secondary | ICD-10-CM | POA: Diagnosis not present

## 2015-11-14 DIAGNOSIS — Z86711 Personal history of pulmonary embolism: Secondary | ICD-10-CM

## 2015-11-14 DIAGNOSIS — Z7901 Long term (current) use of anticoagulants: Secondary | ICD-10-CM

## 2015-11-14 LAB — POCT INR: INR: 2.7

## 2015-12-02 ENCOUNTER — Ambulatory Visit (INDEPENDENT_AMBULATORY_CARE_PROVIDER_SITE_OTHER): Payer: BLUE CROSS/BLUE SHIELD | Admitting: *Deleted

## 2015-12-02 DIAGNOSIS — Z86711 Personal history of pulmonary embolism: Secondary | ICD-10-CM | POA: Diagnosis not present

## 2015-12-02 DIAGNOSIS — Z23 Encounter for immunization: Secondary | ICD-10-CM | POA: Diagnosis not present

## 2015-12-02 DIAGNOSIS — I2782 Chronic pulmonary embolism: Secondary | ICD-10-CM | POA: Diagnosis not present

## 2015-12-02 DIAGNOSIS — Z7901 Long term (current) use of anticoagulants: Secondary | ICD-10-CM | POA: Diagnosis not present

## 2015-12-02 LAB — POCT INR: INR: 3.2

## 2015-12-16 ENCOUNTER — Ambulatory Visit (INDEPENDENT_AMBULATORY_CARE_PROVIDER_SITE_OTHER): Payer: BLUE CROSS/BLUE SHIELD | Admitting: *Deleted

## 2015-12-16 DIAGNOSIS — I2782 Chronic pulmonary embolism: Secondary | ICD-10-CM | POA: Diagnosis not present

## 2015-12-16 DIAGNOSIS — Z7901 Long term (current) use of anticoagulants: Secondary | ICD-10-CM | POA: Diagnosis not present

## 2015-12-16 LAB — POCT INR: INR: 2.7

## 2016-01-06 ENCOUNTER — Other Ambulatory Visit: Payer: Self-pay | Admitting: *Deleted

## 2016-01-06 ENCOUNTER — Ambulatory Visit (INDEPENDENT_AMBULATORY_CARE_PROVIDER_SITE_OTHER): Payer: BLUE CROSS/BLUE SHIELD | Admitting: *Deleted

## 2016-01-06 DIAGNOSIS — Z7901 Long term (current) use of anticoagulants: Secondary | ICD-10-CM

## 2016-01-06 LAB — POCT INR: INR: 2.3

## 2016-01-06 MED ORDER — ALBUTEROL SULFATE HFA 108 (90 BASE) MCG/ACT IN AERS: INHALATION_SPRAY | RESPIRATORY_TRACT | 1 refills | Status: DC

## 2016-01-10 ENCOUNTER — Emergency Department (HOSPITAL_COMMUNITY)
Admission: EM | Admit: 2016-01-10 | Discharge: 2016-01-11 | Disposition: A | Discharge status: Home / Self Care | Payer: BLUE CROSS/BLUE SHIELD | Attending: Emergency Medicine | Admitting: Emergency Medicine

## 2016-01-10 ENCOUNTER — Encounter (HOSPITAL_COMMUNITY): Payer: Self-pay | Admitting: *Deleted

## 2016-01-10 DIAGNOSIS — Z8542 Personal history of malignant neoplasm of other parts of uterus: Secondary | ICD-10-CM | POA: Insufficient documentation

## 2016-01-10 DIAGNOSIS — Z79899 Other long term (current) drug therapy: Secondary | ICD-10-CM | POA: Insufficient documentation

## 2016-01-10 DIAGNOSIS — I129 Hypertensive chronic kidney disease with stage 1 through stage 4 chronic kidney disease, or unspecified chronic kidney disease: Secondary | ICD-10-CM | POA: Insufficient documentation

## 2016-01-10 DIAGNOSIS — Z7901 Long term (current) use of anticoagulants: Secondary | ICD-10-CM | POA: Insufficient documentation

## 2016-01-10 DIAGNOSIS — E039 Hypothyroidism, unspecified: Secondary | ICD-10-CM | POA: Diagnosis not present

## 2016-01-10 DIAGNOSIS — N183 Chronic kidney disease, stage 3 (moderate): Secondary | ICD-10-CM | POA: Diagnosis not present

## 2016-01-10 DIAGNOSIS — Z7984 Long term (current) use of oral hypoglycemic drugs: Secondary | ICD-10-CM | POA: Diagnosis not present

## 2016-01-10 DIAGNOSIS — A084 Viral intestinal infection, unspecified: Secondary | ICD-10-CM | POA: Diagnosis not present

## 2016-01-10 DIAGNOSIS — J45909 Unspecified asthma, uncomplicated: Secondary | ICD-10-CM | POA: Diagnosis not present

## 2016-01-10 DIAGNOSIS — R1013 Epigastric pain: Secondary | ICD-10-CM | POA: Diagnosis present

## 2016-01-10 DIAGNOSIS — E119 Type 2 diabetes mellitus without complications: Secondary | ICD-10-CM | POA: Diagnosis not present

## 2016-01-10 LAB — URINE MICROSCOPIC-ADD ON

## 2016-01-10 LAB — URINALYSIS, ROUTINE W REFLEX MICROSCOPIC
BILIRUBIN URINE: NEGATIVE
Glucose, UA: NEGATIVE mg/dL
KETONES UR: NEGATIVE mg/dL
NITRITE: NEGATIVE
Protein, ur: NEGATIVE mg/dL
Specific Gravity, Urine: 1.016 (ref 1.005–1.030)
pH: 5 (ref 5.0–8.0)

## 2016-01-10 LAB — CBC
HCT: 36.7 % (ref 36.0–46.0)
Hemoglobin: 12 g/dL (ref 12.0–15.0)
MCH: 29 pg (ref 26.0–34.0)
MCHC: 32.7 g/dL (ref 30.0–36.0)
MCV: 88.6 fL (ref 78.0–100.0)
PLATELETS: 247 10*3/uL (ref 150–400)
RBC: 4.14 MIL/uL (ref 3.87–5.11)
RDW: 13.8 % (ref 11.5–15.5)
WBC: 7.6 10*3/uL (ref 4.0–10.5)

## 2016-01-10 LAB — PROTIME-INR
INR: 2.19
Prothrombin Time: 24.8 seconds — ABNORMAL HIGH (ref 11.4–15.2)

## 2016-01-10 LAB — COMPREHENSIVE METABOLIC PANEL
ALBUMIN: 4.1 g/dL (ref 3.5–5.0)
ALK PHOS: 46 U/L (ref 38–126)
ALT: 13 U/L — ABNORMAL LOW (ref 14–54)
ANION GAP: 12 (ref 5–15)
AST: 17 U/L (ref 15–41)
BILIRUBIN TOTAL: 0.2 mg/dL — AB (ref 0.3–1.2)
BUN: 32 mg/dL — AB (ref 6–20)
CALCIUM: 9.4 mg/dL (ref 8.9–10.3)
CO2: 25 mmol/L (ref 22–32)
Chloride: 102 mmol/L (ref 101–111)
Creatinine, Ser: 1.87 mg/dL — ABNORMAL HIGH (ref 0.44–1.00)
GFR calc Af Amer: 33 mL/min — ABNORMAL LOW (ref >60)
GFR calc non Af Amer: 28 mL/min — ABNORMAL LOW (ref >60)
GLUCOSE: 114 mg/dL — AB (ref 65–99)
Potassium: 4.3 mmol/L (ref 3.5–5.1)
Sodium: 139 mmol/L (ref 135–145)
Total Protein: 7.8 g/dL (ref 6.5–8.1)

## 2016-01-10 LAB — LIPASE, BLOOD: Lipase: 35 U/L (ref 11–51)

## 2016-01-10 MED ORDER — SODIUM CHLORIDE 0.9 % IV BOLUS (SEPSIS)
1000.0000 mL | Freq: Once | INTRAVENOUS | Status: AC
  Administered 2016-01-11: 1000 mL via INTRAVENOUS

## 2016-01-10 NOTE — ED Provider Notes (Signed)
[facility] DEPT Provider Note   CSN: [id number] Arrival date & time: 01/10/16  1919     History   Chief Complaint Chief Complaint  Patient presents with  . Abdominal Pain    HPI Allison [name] is a 60 y.o. female.  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 She reports 2 weeks of watery diarrhea (4-8x/day, yellow/green), indigestion, abdominal distension, nausea, chills, poor po intake, and malaise. She initially had intermittent mild abdominal discomfort that has become constant and moderate today - prompting her visit to ED. The pain is aching and epigastric/RUQ radiating around to right flank. She denies medication changes, vomiting, urinary symptoms, fevers, chest pain, shortness of breath, or recent antibiotic use (last in June 2017 for pneumonia).   The history is provided by the patient. No language interpreter was used.    Past Medical History:  Diagnosis Date  . Anxiety   . Diabetes mellitus   . Hyperlipidemia   . Hypertension   . Pulmonary embolus ([place])   . Thyroid disease   . Uterine cancer [facility])     Patient Active Problem List   Diagnosis Date Noted  . Right foot pain 11/04/2015  . Yeast vaginitis 09/29/2015  . Chronic kidney disease (CKD), stage III (moderate) 09/12/2015  . Acute bronchitis 06/12/2013  . Axillary abscess 05/01/2013  . Uric acid renal calculus 11/24/2012  . Leg swelling 03/18/2012  . Nephrolithiasis 10/18/2011  . Preventive measure 09/04/2011  . Hypothyroid 10/10/2010  . Venous stasis dermatitis 09/26/2010  . Long term (current) use of anticoagulants 05/17/2010  . Asthma 01/03/2010  . PROTEINURIA 08/25/2009  . ADENOCARCINOMA, UTERUS 05/19/2009  . MORBID OBESITY 04/14/2009  . KNEE PAIN, BILATERAL 04/14/2009  . Chronic pulmonary embolism ([place]) 10/02/2008  . GOITER NOS 05/30/2006  . Type II diabetes mellitus with  complication ([place]) [id number]  . HYPERLIPIDEMIA 05/30/2006  . Anxiety state 05/30/2006  . DEPRESSIVE DISORDER, NOS 05/30/2006  . HYPERTENSION, BENIGN SYSTEMIC 05/30/2006  . GERD 04/05/2006  . LOW BACK PAIN 04/05/2006    Past Surgical History:  Procedure Laterality Date  . ABDOMINAL HYSTERECTOMY     TAHBSO  . THYROID SURGERY      OB History    No data available       Home Medications    Prior to Admission medications   Medication Sig Start Date End Date Taking? Authorizing Provider  albuterol (PROAIR HFA) 108 (90 Base) MCG/ACT inhaler USE 2 INHALATIONS EVERY 6 HOURS AS NEEDED FOR WHEEZING OR SHORTNESS OF BREATH 01/06/16  Yes Elberta Leatherwood, MD  ALPRAZolam Duanne Moron) 0.5 MG tablet Take 1 tablet (0.5 mg total) by mouth 3 (three) times daily as needed for anxiety. 07/22/15  Yes Frazier Richards, MD  cetirizine (ZYRTEC) 10 MG tablet Take 10 mg by mouth daily.     Yes Historical Provider, MD  glyBURIDE (DIABETA) 5 MG tablet Take 1 tablet (5 mg total) by mouth 2 (two) times daily with a meal. 10/05/15  Yes Elberta Leatherwood, MD  levothyroxine (SYNTHROID, LEVOTHROID) 125 MCG tablet TAKE 1 TABLET DAILY BEFORE BREAKFAST 04/15/15  Yes Frazier Richards, MD  lisinopril-hydrochlorothiazide (PRINZIDE,ZESTORETIC) 20-12.5 MG tablet TAKE 1 TABLET DAILY 03/11/15  Yes Frazier Richards, MD  metFORMIN (GLUCOPHAGE) 1000 MG tablet TAKE 1 TABLET TWICE A DAY WITH MEALS 03/11/15  Yes Frazier Richards, MD  montelukast (SINGULAIR) 10 MG tablet TAKE 1 TABLET  AT BEDTIME 03/11/15  Yes Frazier Richards, MD  pravastatin (PRAVACHOL) 40 MG tablet TAKE 1 TABLET DAILY 03/11/15  Yes Frazier Richards, MD  verapamil (CALAN-SR) 120 MG CR tablet TAKE 1 TABLET AT BEDTIME 03/11/15  Yes Frazier Richards, MD  warfarin (COUMADIN) 4 MG tablet Take as directed Patient taking differently: Take 2-4 mg by mouth See admin instructions. Take 2 mg on Monday and Friday then take 4 mg all the other days 06/12/15  Yes Frazier Richards, MD  benzonatate (TESSALON) 100 MG capsule  Take 1 capsule (100 mg total) by mouth 3 (three) times daily as needed for cough. Patient not taking: Reported on 01/10/2016 09/12/15   Frazier Richards, MD  colchicine (COLCRYS) 0.6 MG tablet Take 2 tablets (1.2mg ) now, then take 1 more tablet after 1 hr if pain has not resolved. Then take 1 tablet daily until pain resolved Patient not taking: Reported on 01/10/2016 11/04/15   Archie Patten, MD  fexofenadine-pseudoephedrine (ALLEGRA-D 24) 180-240 MG 24 hr tablet Take 1 tablet by mouth daily. Patient not taking: Reported on 01/10/2016 09/02/15   Virginia Crews, MD  promethazine (PHENERGAN) 12.5 MG tablet Take 1 tablet (12.5 mg total) by mouth every 6 (six) hours as needed for nausea or vomiting. 01/11/16   Asencion Partridge, MD  warfarin (COUMADIN) 5 MG tablet TAKE 1 TABLET AS DIRECTED Patient not taking: Reported on 01/10/2016 07/01/14   Frazier Richards, MD    Family History Family History  Problem Relation Age of Onset  . Adopted: Yes    Social History Social History  Substance Use Topics  . Smoking status: Never Smoker  . Smokeless tobacco: Never Used  . Alcohol use No     Allergies   Oxycodone; Simvastatin; and Tramadol hcl   Review of Systems Review of Systems  Constitutional: Positive for chills, fatigue and unexpected weight change (reports 6 lb weight loss). Negative for appetite change and fever.  Respiratory: Negative for cough, chest tightness and shortness of breath.   Cardiovascular: Negative for chest pain, palpitations and leg swelling.  Gastrointestinal: Positive for abdominal distention, abdominal pain, diarrhea and nausea. Negative for blood in stool, constipation and vomiting.  Endocrine: Negative for polydipsia and polyuria.  Genitourinary: Positive for flank pain. Negative for difficulty urinating, dysuria, frequency and urgency.  Neurological: Positive for dizziness.  All other systems reviewed and are negative.    Physical Exam Updated Vital Signs BP (!)  94/49   Pulse 66   Temp 98.4 F (36.9 C)   Resp (!) 7   Ht 5\' 6"  (1.676 m)   Wt 112.5 kg   SpO2 95%   BMI 40.03 kg/m   Physical Exam  Constitutional: She is oriented to person, place, and time. She appears well-developed and well-nourished. No distress.  Obese, elderly  HENT:  Head: Normocephalic and atraumatic.  Eyes: EOM are normal. Pupils are equal, round, and reactive to light.  Neck: Normal range of motion. Neck supple.  Cardiovascular: Normal rate, regular rhythm, normal heart sounds and intact distal pulses.  Exam reveals no gallop and no friction rub.   No murmur heard. Auscultation limited by body habitus   Pulmonary/Chest: Effort normal and breath sounds normal. No respiratory distress. She has no wheezes. She has no rales.  Abdominal: Soft. Bowel sounds are normal. She exhibits distension. She exhibits no mass. There is tenderness (mild epigastric, RUQ). There is no rebound and no guarding.  Genitourinary:  Genitourinary Comments: R CVA tenderness to  percussion  Musculoskeletal: Normal range of motion. She exhibits no edema, tenderness or deformity.  Neurological: She is alert and oriented to person, place, and time.  Skin: Skin is warm and dry. Capillary refill takes less than 2 seconds. No rash noted. She is not diaphoretic. No erythema.  Psychiatric: She has a normal mood and affect. Her behavior is normal. Thought content normal.   ED Treatments / Results  Labs (all labs ordered are listed, but only abnormal results are displayed) Labs Reviewed  COMPREHENSIVE METABOLIC PANEL - Abnormal; Notable for the following:       Result Value   Glucose, Bld 114 (*)    BUN 32 (*)    Creatinine, Ser 1.87 (*)    ALT 13 (*)    Total Bilirubin 0.2 (*)    GFR calc non Af Amer 28 (*)    GFR calc Af Amer 33 (*)    All other components within normal limits  URINALYSIS, ROUTINE W REFLEX MICROSCOPIC (NOT AT [facility]) - Abnormal; Notable for the following:    APPearance CLOUDY (*)      Hgb urine dipstick MODERATE (*)    Leukocytes, UA LARGE (*)    All other components within normal limits  URINE MICROSCOPIC-ADD ON - Abnormal; Notable for the following:    Squamous Epithelial / LPF 0-5 (*)    Bacteria, UA RARE (*)    All other components within normal limits  PROTIME-INR - Abnormal; Notable for the following:    Prothrombin Time 24.8 (*)    All other components within normal limits  CBG MONITORING, ED - Abnormal; Notable for the following:    Glucose-Capillary 124 (*)    All other components within normal limits  LIPASE, BLOOD  CBC    EKG  EKG Interpretation  Date/Time:  Wednesday January 11 2016 00:13:16 EDT Ventricular Rate:  81 PR Interval:    QRS Duration: 88 QT Interval:  377 QTC Calculation: 438 R Axis:   -34 Text Interpretation:  Sinus rhythm Left axis deviation Low voltage, precordial leads Consider anterior infarct No significant change since last tracing Confirmed by Christy Gentles  MD, Elenore Rota ([id number]) on 01/11/2016 12:26:00 AM Also confirmed by Christy Gentles  MD, [place] ([id number]), editor [place]  CCT, [place] ([id number])  on 01/11/2016 7:36:42 AM       Radiology US Abdomen Complete  Result Date: 01/11/2016 CLINICAL DATA:  Right upper quadrant and epigastric pain. EXAM: ABDOMEN ULTRASOUND COMPLETE COMPARISON:  CT 10/20/2012 FINDINGS: Gallbladder: Physiologically distended. Multiple mobile gallstones largest measuring 1.2 cm. No wall thickening or pericholecystic fluid. No sonographic Murphy sign noted by sonographer. Common bile duct: Diameter: 2 mm. Liver: No focal lesion identified. Diffusely increased in parenchymal echogenicity. Normal directional flow in the main portal vein. IVC: No abnormality visualized. Pancreas: Visualized portion is echogenic. No ductal dilatation visualized. Spleen: Size and appearance within normal limits. Right Kidney: Length: 11.2 cm. There is no hydronephrosis. Shadowing nonobstructing stones are seen largest measuring 1.4 cm in the  interpolar region. No focal lesion. Left Kidney: Length: 9.8 cm. There is no hydronephrosis. Multiple shadowing stones, largest measuring 8 mm. Abdominal aorta: No aneurysm visualized. Other findings: None.  No ascites. IMPRESSION: 1. Cholelithiasis without sonographic findings of acute cholecystitis. 2. Bilateral nephrolithiasis.  No hydronephrosis. 3. Hepatic steatosis. Electronically Signed   By: Jeb Levering M.D.   On: 01/11/2016 01:34    Procedures Procedures (including critical care time)  Medications Ordered in ED Medications  sodium chloride 0.9 % bolus 1,000 mL (0 mLs  Intravenous Stopped 01/11/16 0130)  fentaNYL (SUBLIMAZE) injection 50 mcg (50 mcg Intravenous Given 01/11/16 0035)  ondansetron (ZOFRAN) injection 4 mg (4 mg Intravenous Given 01/11/16 0035)  promethazine (PHENERGAN) injection 12.5 mg (12.5 mg Intravenous Given 01/11/16 0206)  sodium chloride 0.9 % bolus 1,000 mL (0 mLs Intravenous Stopped 01/11/16 0533)     Initial Impression / Assessment and Plan / ED Course  I have reviewed the triage vital signs and the nursing notes.  Pertinent labs & imaging results that were available during my care of the patient were reviewed by me and considered in my medical decision making (see chart for details).  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 Mildly hypotensive but not tachycardic, minimal abdominal tenderness and right CVA tenderness on exam. Abdominal ultrasound revealed stable kidney stone, no nephrolithiasis, cholelithiasis but no evidence of acute cholecystitis. Patient has had poor po intake and continued to take her antihypertensives, mild AKI (Cr 1.8 from baseline 1.2).  Provided IVF boluses, pain and nausea control. Was unable to obtain stool for c diff testing, patient had no BM during ED stay. Endorses  significant symptom improvement and tolerating fluids po after several hours. Suspect viral gastroenteritis with some degree of symptomatic cholelithiasis causing abdominal/flank discomfort. Patient discharged home with Rx phenergan, encouraged to maintain hydration/good po intake and holding antihypertensives until PCP follow up.  May be experiencing symptomatic cholelithiasis and may benefit from referral to gen surg and elective cholecystectomy in future.  Final Clinical Impressions(s) / ED Diagnoses   Final diagnoses:  Viral gastroenteritis    New Prescriptions Discharge Medication List as of 01/11/2016  5:39 AM    START taking these medications   Details  promethazine (PHENERGAN) 12.5 MG tablet Take 1 tablet (12.5 mg total) by mouth every 6 (six) hours as needed for nausea or vomiting., Starting Wed 01/11/2016, Print         Asencion Partridge, MD 01/11/16 1342    Ripley Fraise, MD 01/11/16 [phone]

## 2016-01-10 NOTE — ED Provider Notes (Signed)
Patient seen/examined in the Emergency Department in conjunction with Resident Physician Provider  Patient reports abdominal pain and also nonbloody diarrhea Exam : awake/alert, mild RUQ/epigastric tenderness Plan: will plan to evaluate for cholelithiais/cholecystitis     Ripley Fraise, MD 01/10/16 2340

## 2016-01-10 NOTE — ED Triage Notes (Signed)
The pt is c/o abd and lower back pain for 2 weeks with nv and diarhea  She has not seen a doctor

## 2016-01-11 ENCOUNTER — Other Ambulatory Visit: Payer: Self-pay

## 2016-01-11 ENCOUNTER — Emergency Department (HOSPITAL_COMMUNITY): Payer: BLUE CROSS/BLUE SHIELD

## 2016-01-11 LAB — CBG MONITORING, ED: GLUCOSE-CAPILLARY: 124 mg/dL — AB (ref 65–99)

## 2016-01-11 MED ORDER — SODIUM CHLORIDE 0.9 % IV BOLUS (SEPSIS)
1000.0000 mL | Freq: Once | INTRAVENOUS | Status: AC
  Administered 2016-01-11: 1000 mL via INTRAVENOUS

## 2016-01-11 MED ORDER — FENTANYL CITRATE (PF) 100 MCG/2ML IJ SOLN
50.0000 ug | Freq: Once | INTRAMUSCULAR | Status: AC
  Administered 2016-01-11: 50 ug via INTRAVENOUS
  Filled 2016-01-11: qty 2

## 2016-01-11 MED ORDER — PROMETHAZINE HCL 25 MG/ML IJ SOLN
12.5000 mg | Freq: Once | INTRAMUSCULAR | Status: AC
  Administered 2016-01-11: 12.5 mg via INTRAVENOUS
  Filled 2016-01-11: qty 1

## 2016-01-11 MED ORDER — ONDANSETRON HCL 4 MG/2ML IJ SOLN
4.0000 mg | Freq: Once | INTRAMUSCULAR | Status: AC
  Administered 2016-01-11: 4 mg via INTRAVENOUS
  Filled 2016-01-11: qty 2

## 2016-01-11 MED ORDER — PROMETHAZINE HCL 12.5 MG PO TABS: 12.5000 mg | ORAL_TABLET | Freq: Four times a day (QID) | ORAL | 0 refills | Status: DC | PRN

## 2016-01-11 NOTE — Discharge Instructions (Addendum)
Please remain well-hydrated and drink large amounts of fluids while you are experiencing diarrhea. Please do not take your blood pressure medication until you see your primary care doctor - who you should visit with in 1-2 weeks. We have provided a prescription for Phenergan to take for nausea. Please return if you develop severe abdominal pain, nausea/vomiting that is not going away, high fevers, or persistent or worsening diarrhea. Discuss with your PCP the need for a referral to surgery for your gall bladder stones.

## 2016-01-11 NOTE — ED Notes (Signed)
Pt returned from [place] at this time. Pt in no apparent distress at this time.  Will continue to closely monitor pt.

## 2016-01-11 NOTE — ED Notes (Signed)
Patient transported to [place] at this time via ED stretcher.  Pt in no apparent distress at this time.

## 2016-01-11 NOTE — ED Notes (Signed)
Pt provided with d/c instructions at this time. Pt verbalizes understanding of d/c instructions as well as follow up procedure after d/c.  Pt provided with RX for promethazine. Pt verbalizes understanding of RX directions. Pt in no apparent distress at this time. Pt assisted out of the ED via [place] at pt's request.

## 2016-01-20 ENCOUNTER — Encounter: Payer: Self-pay | Admitting: Family Medicine

## 2016-01-20 ENCOUNTER — Ambulatory Visit (INDEPENDENT_AMBULATORY_CARE_PROVIDER_SITE_OTHER): Payer: BLUE CROSS/BLUE SHIELD | Admitting: Family Medicine

## 2016-01-20 ENCOUNTER — Ambulatory Visit (INDEPENDENT_AMBULATORY_CARE_PROVIDER_SITE_OTHER): Payer: BLUE CROSS/BLUE SHIELD | Admitting: *Deleted

## 2016-01-20 VITALS — BP 132/70 | HR 76 | Temp 98.3°F | Ht 66.0 in | Wt 254.0 lb

## 2016-01-20 DIAGNOSIS — E119 Type 2 diabetes mellitus without complications: Secondary | ICD-10-CM | POA: Diagnosis not present

## 2016-01-20 DIAGNOSIS — Z7901 Long term (current) use of anticoagulants: Secondary | ICD-10-CM | POA: Diagnosis not present

## 2016-01-20 DIAGNOSIS — N183 Chronic kidney disease, stage 3 unspecified: Secondary | ICD-10-CM

## 2016-01-20 DIAGNOSIS — E039 Hypothyroidism, unspecified: Secondary | ICD-10-CM

## 2016-01-20 DIAGNOSIS — I10 Essential (primary) hypertension: Secondary | ICD-10-CM

## 2016-01-20 DIAGNOSIS — K8020 Calculus of gallbladder without cholecystitis without obstruction: Secondary | ICD-10-CM

## 2016-01-20 DIAGNOSIS — I509 Heart failure, unspecified: Secondary | ICD-10-CM

## 2016-01-20 DIAGNOSIS — Z23 Encounter for immunization: Secondary | ICD-10-CM | POA: Diagnosis not present

## 2016-01-20 LAB — BASIC METABOLIC PANEL WITH GFR
BUN: 11 mg/dL (ref 7–25)
CALCIUM: 7.5 mg/dL — AB (ref 8.6–10.4)
CO2: 28 mmol/L (ref 20–31)
CREATININE: 1.3 mg/dL — AB (ref 0.50–0.99)
Chloride: 103 mmol/L (ref 98–110)
GFR, EST AFRICAN AMERICAN: 52 mL/min — AB (ref >=60)
GFR, Est Non African American: 45 mL/min — ABNORMAL LOW (ref >=60)
Glucose, Bld: 113 mg/dL — ABNORMAL HIGH (ref 65–99)
Potassium: 4.3 mmol/L (ref 3.5–5.3)
SODIUM: 145 mmol/L (ref 135–146)

## 2016-01-20 LAB — POCT INR: INR: 2.5

## 2016-01-20 LAB — TSH: TSH: 1.26 m[IU]/L

## 2016-01-20 LAB — GLUCOSE, CAPILLARY: GLUCOSE-CAPILLARY: 119 mg/dL — AB (ref 65–99)

## 2016-01-20 LAB — POCT GLYCOSYLATED HEMOGLOBIN (HGB A1C): Hemoglobin A1C: 6.9

## 2016-01-20 NOTE — Assessment & Plan Note (Addendum)
Chronic. Creatinine elevated at 1.87 during ED visit while hypotensive. Baseline ~1.2-1.3. On ACE-I/Thiazide, but d/c due to hypotension which has resolved. - BMET pending - Holding Metformin until EGFR determined - Stop Lisinopril-HCTZ 20-12.5 mg QD for now pending BMET for kidney function - A1c pending

## 2016-01-20 NOTE — Patient Instructions (Addendum)
It was a pleasure to meet you today. Please see below to review our plan for today's visit.  1. Please continue not to take your Lisinopril-HCTZ and do not take your Metformin until I receive you lab work back. You will be notified of your kidney function. 2. You have received your flu shot. 3. Your INR is within normal limits. 4. Your blood pressure is good today. Continue Verapamil for now. 5. You will be notified of your lab results. 6. I have sent a referral to general surgery to assess your gallbladder. 7. Keep adequate hydration while recovering from virus. 8. We will schedule an ECHO to look at your heart. You will be called for appointment. 9. Please return in 3 months.  Please call the clinic at [phone] if your symptoms worsen or you have any concerns. It was my pleasure to see you. -- Harriet [place], [place], PGY-1   Cholelithiasis Cholelithiasis (also called gallstones) is a form of gallbladder disease in which gallstones form in your gallbladder. The gallbladder is an organ that stores bile made in the liver, which helps digest fats. Gallstones begin as small crystals and slowly grow into stones. Gallstone pain occurs when the gallbladder spasms and a gallstone is blocking the duct. Pain can also occur when a stone passes out of the duct.  RISK FACTORS  Being female.   Having multiple pregnancies. Health care providers sometimes advise removing diseased gallbladders before future pregnancies.   Being obese.  Eating a diet heavy in fried foods and fat.   Being older than 75 years and increasing age.   Prolonged use of medicines containing female hormones.   Having diabetes mellitus.   Rapidly losing weight.   Having a family history of gallstones (heredity).  SYMPTOMS  Nausea.   Vomiting.  Abdominal pain.   Yellowing of the skin (jaundice).   Sudden pain. It may persist from several minutes to several  hours.  Fever.   Tenderness to the touch. In some cases, when gallstones do not move into the bile duct, people have no pain or symptoms. These are called "silent" gallstones.  TREATMENT Silent gallstones do not need treatment. In severe cases, emergency surgery may be required. Options for treatment include:  Surgery to remove the gallbladder. This is the most common treatment.  Medicines. These do not always work and may take 6-12 months or more to work.  Shock wave treatment (extracorporeal biliary lithotripsy). In this treatment an ultrasound machine sends shock waves to the gallbladder to break gallstones into smaller pieces that can pass into the intestines or be dissolved by medicine. HOME CARE INSTRUCTIONS   Only take over-the-counter or prescription medicines for pain, discomfort, or fever as directed by your health care provider.   Follow a low-fat diet until seen again by your health care provider. Fat causes the gallbladder to contract, which can result in pain.   Follow up with your health care provider as directed. Attacks are almost always recurrent and surgery is usually required for permanent treatment.  SEEK IMMEDIATE MEDICAL CARE IF:   Your pain increases and is not controlled by medicines.   You have a fever or persistent symptoms for more than 2-3 days.   You have a fever and your symptoms suddenly get worse.   You have persistent nausea and vomiting.  MAKE SURE YOU:   Understand these instructions.  Will watch your condition.  Will get help right away if you are not doing well  or get worse.   This information is not intended to replace advice given to you by your health care provider. Make sure you discuss any questions you have with your health care provider.   Document Released: 03/15/2005 Document Revised: 11/19/2012 Document Reviewed: 09/10/2012 Elsevier Interactive Patient Education [employer].

## 2016-01-20 NOTE — Progress Notes (Signed)
Subjective:   Patient ID: Allison [name]    DOB: [birth year], 60 y.o. female   MRN: [id number]  CC: Low Blood Pressure  HPI: Allison [name] is a 60 y.o. female who presents to clinic today for low blood pressure. Problems discussed today are as follows:  1. Low Blood Pressure: patient was seen at ED on 01/10/16 for viral gastroenteritis and was found to be hypotensive at 94/49 without symptoms. EKG showed NSR w/o hypertrophy and CXR was neg. She was given fluid bolus. Patient was discharged with discontinuation of Lisinopril-HCTZ. Denies chest pain, dyspnea, lightheadedness, feelings of fatigue, change in weight, or lightheadedness.  2. Gallbladder Stones: patient was c/o RUQ pain while in the ED for viral gastroenteritis. Abdominal U/S showed stones w/o cholecystitis. Patient notes dull pain has improved from 8/10 to 2/10 in severity since ED visit. Pain does radiate to right back. Diarrhea has been minimal over past week and continues to improve. Continues to have decreased oral intake but maintaining adequate hydration. Denies fevers, nausea, vomiting, constipation, dysuria.  ROS: See HPI for pertinent ROS.  [place]: Pertinent past medical, surgical, family, and social history were reviewed and updated as appropriate. Smoking status reviewed.  Medications reviewed. Current Outpatient Prescriptions  Medication Sig Dispense Refill  . albuterol (PROAIR HFA) 108 (90 Base) MCG/ACT inhaler USE 2 INHALATIONS EVERY 6 HOURS AS NEEDED FOR WHEEZING OR SHORTNESS OF BREATH 17 g 1  . ALPRAZolam (XANAX) 0.5 MG tablet Take 1 tablet (0.5 mg total) by mouth 3 (three) times daily as needed for anxiety. 90 tablet 5  . cetirizine (ZYRTEC) 10 MG tablet Take 10 mg by mouth daily.      Marland Kitchen glyBURIDE (DIABETA) 5 MG tablet Take 1 tablet (5 mg total) by mouth 2 (two) times daily with a meal. 180 tablet 3  . levothyroxine (SYNTHROID, LEVOTHROID) 125 MCG tablet TAKE 1 TABLET DAILY BEFORE BREAKFAST 90 tablet 3  .  metFORMIN (GLUCOPHAGE) 1000 MG tablet TAKE 1 TABLET TWICE A DAY WITH MEALS 180 tablet 3  . montelukast (SINGULAIR) 10 MG tablet TAKE 1 TABLET AT BEDTIME 90 tablet 3  . pravastatin (PRAVACHOL) 40 MG tablet TAKE 1 TABLET DAILY 90 tablet 3  . verapamil (CALAN-SR) 120 MG CR tablet TAKE 1 TABLET AT BEDTIME 90 tablet 3  . warfarin (COUMADIN) 4 MG tablet Take as directed (Patient taking differently: Take 2-4 mg by mouth See admin instructions. Take 2 mg on Monday and Friday then take 4 mg all the other days) 90 tablet 3  . lisinopril-hydrochlorothiazide (PRINZIDE,ZESTORETIC) 20-12.5 MG tablet TAKE 1 TABLET DAILY (Patient not taking: Reported on 01/20/2016) 90 tablet 3  . warfarin (COUMADIN) 5 MG tablet TAKE 1 TABLET AS DIRECTED (Patient not taking: Reported on 01/20/2016) 90 tablet 3   No current facility-administered medications for this visit.     Objective:   BP 132/70   Pulse 76   Temp 98.3 F (36.8 C) (Oral)   Ht _0  (1.676 m)   Wt 254 lb (115.2 kg)   BMI 41.00 kg/m  Vitals and nursing note reviewed.  General: morbidly obese, well nourished, well developed, in no acute distress with non-toxic appearance HEENT: normocephalic, atraumatic, moist mucous membranes Neck: supple, non-tender without lymphadenopathy CV: regular rate and rhythm without murmurs, rubs, or gallops Lungs: clear to auscultation bilaterally with normal work of breathing Abdomen: soft, mild tenderness at RUQ without rebound or guarding, negative murphy sign, non-distended, no masses or organomegaly palpable, normoactive bowel sounds Skin: warm, dry, no  rashes or lesions, cap refill < 2 seconds Extremities: warm and well perfused, normal tone  Assessment & Plan:   HYPERTENSION, BENIGN SYSTEMIC Controlled. Was hypotensive in ED at 94/49 w/o symptoms. ACE/Thiazide discontinued upon ED d/c. Normotensive at [facility] at 132/70. On Verapamil. Does not appear to be in heart failure given EKG NSR, neg CXR, and BNP wnl during ED  visit. No ECHO on file and given HTN, CAD, and CKD history along with morbid obesity, would benefit from ECHO for baseline heart funtion.  - Verapamil 120 mg QD - Stop Lisinopril-HCTZ 20-12.5 mg QD for now pending BMET for kidney function - Encouraged adequate hydration given diarrheal virus over past month - ECHO ordered for screening of heart function, no prior in chart - TSH pending - Return in 3 months  Chronic kidney disease (CKD), stage III (moderate) Chronic. Creatinine elevated at 1.87 during ED visit while hypotensive. Baseline ~1.2-1.3. On ACE-I/Thiazide, but d/c due to hypotension which has resolved. - BMET pending - Holding Metformin until EGFR determined - Stop Lisinopril-HCTZ 20-12.5 mg QD for now pending BMET for kidney function - A1c pending  Cholelithiasis New finding. Abdominal U/S found 1.2 cm stone in gallbladder w/o signs of cholecystitis or bile duct obstruction. Patient has minimal pain w/o nausea. Physical exam reassuring for uncomplicated cholelithiasis. - General surgery referral placed for evaluation of cholelithiasis - Encouraged patient to maintain adequate oral intake, appears euvolemic and normotensive - Pain appears to be minimal, tylenol PRN  Orders Placed This Encounter  Procedures  . TSH  . BASIC METABOLIC PANEL WITH GFR  . Glucose, capillary  . Ambulatory referral to General Surgery    Referral Priority:   Elective    Referral Type:   Surgical    Referral Reason:   Specialty Services Required    Requested Specialty:   General Surgery    Number of Visits Requested:   1  . POCT glycosylated hemoglobin (Hb A1C)  . POCT glucose (manual entry)  . ECHOCARDIOGRAM COMPLETE    Standing Status:   Future    Standing Expiration Date:   04/21/2017    Order Specific Question:   Where should this test be performed    Answer:   [place]    Order Specific Question:   Complete or Limited study?    Answer:   Complete    Order Specific Question:   Does the  patient have a known history of hypersensitivity to Perflutren (aka Scientist, research (medical) for echocardiograms - CHECK ALLERGIES)    Answer:   No    Order Specific Question:   ADMINISTER PERFLUTERN    Answer:   ADMINISTER PERFLUTREN    Order Specific Question:   Expected Date:    Answer:   3 months    Order Specific Question:   Reason for exam-Echo    Answer:   Congestive Heart Failure  428.0 / I50.9   No orders of the defined types were placed in this encounter.   Harriet Butte, [place], PGY-1 01/20/2016 10:24 AM

## 2016-01-20 NOTE — Assessment & Plan Note (Addendum)
Controlled. Was hypotensive in ED at 94/49 w/o symptoms. ACE/Thiazide discontinued upon ED d/c. Normotensive at [facility] at 132/70. On Verapamil. Does not appear to be in heart failure given EKG NSR, neg CXR, and BNP wnl during ED visit. No ECHO on file and given HTN, CAD, and CKD history along with morbid obesity, would benefit from ECHO for baseline heart funtion.  - Verapamil 120 mg QD - Stop Lisinopril-HCTZ 20-12.5 mg QD for now pending BMET for kidney function - Encouraged adequate hydration given diarrheal virus over past month - ECHO ordered for screening of heart function, no prior in chart - TSH pending - Return in 3 months

## 2016-01-20 NOTE — Assessment & Plan Note (Addendum)
New finding. Abdominal U/S found 1.2 cm stone in gallbladder w/o signs of cholecystitis or bile duct obstruction. Patient has minimal pain w/o nausea. Physical exam reassuring for uncomplicated cholelithiasis. - General surgery referral placed for evaluation of cholelithiasis - Encouraged patient to maintain adequate oral intake, appears euvolemic and normotensive - Pain appears to be minimal, tylenol PRN

## 2016-01-23 ENCOUNTER — Encounter: Payer: Self-pay | Admitting: Family Medicine

## 2016-01-23 ENCOUNTER — Telehealth: Payer: Self-pay | Admitting: Family Medicine

## 2016-01-23 NOTE — Telephone Encounter (Signed)
Discussed lab work with patient from [facility] visit on 10/20. Kidney function has returned to baseline and patient was informed she could restart her Metformin and ACE-I. Recommended to patient to check BP once per week at [facility] or [facility] and to record the readings. Patient denies symptoms of lightheadedness, palpitations, change in vision, nausea, vomiting, abdominal pain, chest pain or dyspnea. -- Harriet Butte, [place], PGY-1

## 2016-02-02 ENCOUNTER — Other Ambulatory Visit: Payer: Self-pay | Admitting: General Surgery

## 2016-02-07 ENCOUNTER — Telehealth: Payer: Self-pay | Admitting: *Deleted

## 2016-02-07 ENCOUNTER — Other Ambulatory Visit: Payer: Self-pay | Admitting: General Surgery

## 2016-02-07 DIAGNOSIS — R109 Unspecified abdominal pain: Secondary | ICD-10-CM

## 2016-02-07 DIAGNOSIS — Z8542 Personal history of malignant neoplasm of other parts of uterus: Secondary | ICD-10-CM

## 2016-02-07 DIAGNOSIS — Z8719 Personal history of other diseases of the digestive system: Secondary | ICD-10-CM

## 2016-02-07 NOTE — Telephone Encounter (Signed)
-----   Message from Elberta Leatherwood, MD sent at 02/06/2016  8:05 PM EST ----- HI! Patient needs to schedule an appointment with [place] as I just received a request for cardiac clearance if she is to have abdominal surgery.  Thank you!

## 2016-02-07 NOTE — Telephone Encounter (Signed)
Spoke with patient, appointment scheduled for 11/10 with PCP.

## 2016-02-10 ENCOUNTER — Ambulatory Visit (INDEPENDENT_AMBULATORY_CARE_PROVIDER_SITE_OTHER): Payer: BLUE CROSS/BLUE SHIELD | Admitting: Family Medicine

## 2016-02-10 VITALS — BP 130/67 | Temp 97.6°F | Ht 66.0 in | Wt 249.4 lb

## 2016-02-10 DIAGNOSIS — R0609 Other forms of dyspnea: Secondary | ICD-10-CM | POA: Diagnosis not present

## 2016-02-10 DIAGNOSIS — K8000 Calculus of gallbladder with acute cholecystitis without obstruction: Secondary | ICD-10-CM

## 2016-02-10 DIAGNOSIS — K8010 Calculus of gallbladder with chronic cholecystitis without obstruction: Secondary | ICD-10-CM

## 2016-02-10 DIAGNOSIS — Z79899 Other long term (current) drug therapy: Secondary | ICD-10-CM | POA: Diagnosis not present

## 2016-02-10 DIAGNOSIS — I2782 Chronic pulmonary embolism: Secondary | ICD-10-CM

## 2016-02-10 LAB — POCT INR: INR: 3.3

## 2016-02-10 NOTE — Patient Instructions (Signed)
It was a pleasure seeing you today in our clinic. Today we discussed getting clearance for your abdominal surgery. Here is the treatment plan we have discussed and agreed upon together:   - At this time I believe it is important to have you seen by a cardiologist for your shortness of breath with any extended exertion. I feel as though this is most likely due to your long-term pulmonary embolism but I cannot confidently rule out any other cause at this time. In order to safely undergo this abdominal procedure I believe it is in everyone's best interest to have you tested with a stress test before surgery. - Once cleared by cardiology I would like for you to hold your warfarin 5 days prior to the scheduled surgical procedure. - I would like you to have your INR checked the day before surgery. - You are to NOT take your lisinopril-hydrochlorothiazide medication on the day of surgery. - You are to NOT take your metformin and glyburide medications on the day of surgery as well.

## 2016-02-10 NOTE — Assessment & Plan Note (Signed)
Patient is here for presurgical clearance to undergo a lever scopic cholecystectomy. Patient and I had along discussion about the risks of undergoing anesthesia as well as a intra-abdominal surgical procedure. 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 Unfortunately patient is also experiencing dyspnea on exertion which is likely directly related to her pulmonary emboli but I cannot effectively rule out a cardiac cause at this time, nor do I know if this is causing any right-sided heart strain. - I have placed a referral to cardiology; as I believe it would be in patient's best interest to look into having a cardiac stress test performed. Because of patient's physical state I believe a nuclear stress test would be more appropriate than a traditional stress test if she is to undergo the procedure -- thus I would like cardiology to weigh in with their thoughts.  Presurgical recommendations: - Hold Coumadin 5 days prior to surgery. Restart on the day after surgery. - Hold lisinopril-hydrochlorothiazide the day of surgery. Restart the day after surgery. - Hold metformin and glyburide the day of surgery. Restart the day after surgery.

## 2016-02-10 NOTE — Progress Notes (Signed)
Pt is a 60 y.o. female who is here for preoperative clearance for Lap-choley  1) High Risk Cardiac Conditions  1) Recent MI - No.  2) Decompensated Heart Failure - No.  3) Unstable angina - No.  4) Symptomatic arrythmia - No.  5) Sx Valvular Disease - No.  2) Intermediate Risk Factors - DM, CKD, CVA, CHF, CAD - Yes.    2) Functional Status - > 4 mets (Walk, run, climb stairs) No. Difficult to fully assess as patient has dyspnea on exertion due to chronic pulmonary emboli.  3) Surgery Specific Risk - High  (Emergency, Vascular, Intra-abdominal, Extensive ops)  4) Further Noninvasive evaluation -   1) EKG - Yes.    2) Echo - No. (But ordered at previous visit)  3) Stress Testing - No, but strongly considering w/ Sx of DOE  PE: Vitals:   02/10/16 0859  BP: 130/67  Temp: 97.6 F (36.4 C)   Gen:  60yo female in NAD  HEENT: MMM, anicteric sclerae, OP clear Neck: Supple, no LAD CV: RRR, no MRG Resp: Non-labored, CTAB, good air movement throughout Abd: S, ND, Upper quadrant tenderness to palpation, +BS Ext: WWP, no edema Neuro: Alert and oriented, speech normal   A/P: Cholelithiasis Patient is here for presurgical clearance to undergo a lever scopic cholecystectomy. Patient and I had along discussion about the risks of undergoing anesthesia as well as a intra-abdominal surgical procedure. 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 Unfortunately patient is also experiencing dyspnea on exertion which is likely directly related to her pulmonary emboli but I cannot effectively rule out a cardiac cause at this time, nor do I know if this is causing any right-sided heart strain. - I have placed a referral to cardiology; as I believe it would be in patient's best interest to look into having a cardiac stress test performed. Because of patient's physical state I believe a nuclear stress test would be more  appropriate than a traditional stress test if she is to undergo the procedure -- thus I would like cardiology to weigh in with their thoughts.  Presurgical recommendations: - Hold Coumadin 5 days prior to surgery. Restart on the day after surgery. - Hold lisinopril-hydrochlorothiazide the day of surgery. Restart the day after surgery. - Hold metformin and glyburide the day of surgery. Restart the day after surgery.  I have independently evaluated patient.  KHUSHBU BOWLDS is a 60 y.o. female who is unknown risk for a high risk surgery.    IF stress test is performed and found to be normal, it is my opinion that patient would be a low risk candidate for surgery.   Elberta Leatherwood, MD,MS,  PGY3 02/10/2016 3:50 PM

## 2016-02-13 ENCOUNTER — Ambulatory Visit
Admission: RE | Admit: 2016-02-13 | Discharge: 2016-02-13 | Disposition: A | Discharge status: Home / Self Care | Payer: BLUE CROSS/BLUE SHIELD | Source: Ambulatory Visit | Attending: General Surgery | Admitting: General Surgery

## 2016-02-13 DIAGNOSIS — Z8542 Personal history of malignant neoplasm of other parts of uterus: Secondary | ICD-10-CM

## 2016-02-13 DIAGNOSIS — R109 Unspecified abdominal pain: Secondary | ICD-10-CM

## 2016-02-13 DIAGNOSIS — Z8719 Personal history of other diseases of the digestive system: Secondary | ICD-10-CM

## 2016-02-14 ENCOUNTER — Telehealth: Payer: Self-pay | Admitting: *Deleted

## 2016-02-14 NOTE — Telephone Encounter (Signed)
Called patient to reschedule appointment for coumadin clinic. I scheduled her to come in the day after Thanksgiving which we will be closed on that day. Rescheduled for the following Friday 03/02/2016. [place], Kevin Fenton

## 2016-02-14 NOTE — Progress Notes (Signed)
HPI: 60 year old female for evaluation of dyspnea. Laboratories October 2017 showed creatinine 1.30, normal TSH. Hemoglobin 12. Patient complains of dyspnea on exertion but is noted to have a history of chronic pulmonary emboli. She is scheduled for cholecystectomy. We were also asked to evaluate preoperatively. Patient has chronic dyspnea on exertion since her pulmonary embolus 5 years ago. She denies orthopnea, PND, pedal edema or chest pain. No palpitations or syncope. She cannot ambulate well because of knee pain and dyspnea.  Current Outpatient Prescriptions  Medication Sig Dispense Refill  . albuterol (PROAIR HFA) 108 (90 Base) MCG/ACT inhaler USE 2 INHALATIONS EVERY 6 HOURS AS NEEDED FOR WHEEZING OR SHORTNESS OF BREATH 17 g 1  . ALPRAZolam (XANAX) 0.5 MG tablet Take 1 tablet (0.5 mg total) by mouth 3 (three) times daily as needed for anxiety. 90 tablet 5  . cetirizine (ZYRTEC) 10 MG tablet Take 10 mg by mouth daily.      Marland Kitchen glyBURIDE (DIABETA) 5 MG tablet Take 1 tablet (5 mg total) by mouth 2 (two) times daily with a meal. 180 tablet 3  . levothyroxine (SYNTHROID, LEVOTHROID) 125 MCG tablet TAKE 1 TABLET DAILY BEFORE BREAKFAST 90 tablet 3  . lisinopril-hydrochlorothiazide (PRINZIDE,ZESTORETIC) 20-12.5 MG tablet TAKE 1 TABLET DAILY 90 tablet 3  . metFORMIN (GLUCOPHAGE) 1000 MG tablet TAKE 1 TABLET TWICE A DAY WITH MEALS 180 tablet 3  . montelukast (SINGULAIR) 10 MG tablet TAKE 1 TABLET AT BEDTIME 90 tablet 3  . pravastatin (PRAVACHOL) 40 MG tablet TAKE 1 TABLET DAILY 90 tablet 3  . verapamil (CALAN-SR) 120 MG CR tablet TAKE 1 TABLET AT BEDTIME 90 tablet 3  . warfarin (COUMADIN) 4 MG tablet Take as directed (Patient taking differently: Take 2-4 mg by mouth See admin instructions. Take 2 mg on Monday and Friday then take 4 mg all the other days) 90 tablet 3  . warfarin (COUMADIN) 5 MG tablet TAKE 1 TABLET AS DIRECTED 90 tablet 3   No current facility-administered medications for this  visit.     Allergies  Allergen Reactions  . Oxycodone Nausea And Vomiting  . Simvastatin Other (See Comments)    REACTION: Severe leg pain  . Tramadol Hcl Nausea And Vomiting    REACTION: Felt sick     Past Medical History:  Diagnosis Date  . Anxiety   . Diabetes mellitus   . Hyperlipidemia   . Hypertension   . Nephrolithiasis   . Pulmonary embolus ([place])   . Thyroid disease   . Uterine cancer [facility])     Past Surgical History:  Procedure Laterality Date  . ABDOMINAL HYSTERECTOMY     TAHBSO  . THYROID SURGERY    . TONSILLECTOMY      Social History   Social History  . Marital status: Married    Spouse name: N/A  . Number of children: 2  . Years of education: N/A   Occupational History  . Not on file.   Social History Main Topics  . Smoking status: Never Smoker  . Smokeless tobacco: Never Used  . Alcohol use No  . Drug use: No  . Sexual activity: Not on file   Other Topics Concern  . Not on file   Social History Narrative   Patient cares for 2 grand children while her daughter is at work. This limits her ability to have appointments.    Family History  Problem Relation Age of Onset  . Adopted: Yes  . CAD Brother 43  ROS: Bilateral knee pain but no fevers or chills, productive cough, hemoptysis, dysphasia, odynophagia, melena, hematochezia, dysuria, hematuria, rash, seizure activity, orthopnea, PND, pedal edema, claudication. Remaining systems are negative.  Physical Exam:   Blood pressure 129/80, pulse (!) 131, height 5\' 6"  (1.676 m), weight 247 lb 9.6 oz (112.3 kg), SpO2 97 %.  General:  Well developed/obese in NAD Skin warm/dry Patient not depressed No peripheral clubbing Back-normal HEENT-normal/normal eyelids Neck supple/normal carotid upstroke bilaterally; no bruits; no JVD; no thyromegaly chest - CTA/ normal expansion CV - regular and tachycardic/normal S1 and S2; no murmurs, rubs or gallops;  PMI nondisplaced Abdomen -mild tenderness  to palpation right upper quadrant, no HSM, no mass, + bowel sounds, no bruit, umbilical hernia 2+ femoral pulses, no bruits Ext-no edema, chords; diminished distal pulses Neuro-grossly nonfocal  ECG - Sinus tachycardia at a rate of 131. Left axis deviation. Poor R-wave progression.  A/P  1 preoperative evaluation prior to cholecystectomy-patient has poor functional capacity because of dyspnea since prior pulmonary embolus and knee arthralgias. She has over 15 years of diabetes mellitus. Plan Lexiscan nuclear study for risk stratification preoperatively. If normal or low risk she may proceed.  2 dyspnea-present since previous pulmonary embolus. Will check echocardiogram to assess LV and RV function.  3 prior pulmonary embolus-managed by primary care. On long-term Coumadin.  4 hypertension-blood pressure controlled. Continue present medications.  5 hyperlipidemia-continue statin.   Kirk Ruths, MD

## 2016-02-16 ENCOUNTER — Ambulatory Visit: Payer: BLUE CROSS/BLUE SHIELD | Admitting: Cardiovascular Disease

## 2016-02-17 ENCOUNTER — Ambulatory Visit: Payer: BLUE CROSS/BLUE SHIELD

## 2016-02-17 ENCOUNTER — Encounter: Payer: Self-pay | Admitting: Cardiology

## 2016-02-20 ENCOUNTER — Encounter: Payer: Self-pay | Admitting: Cardiology

## 2016-02-20 ENCOUNTER — Ambulatory Visit (INDEPENDENT_AMBULATORY_CARE_PROVIDER_SITE_OTHER): Payer: BLUE CROSS/BLUE SHIELD | Admitting: Cardiology

## 2016-02-20 VITALS — BP 129/80 | HR 131 | Ht 66.0 in | Wt 247.6 lb

## 2016-02-20 DIAGNOSIS — Z01810 Encounter for preprocedural cardiovascular examination: Secondary | ICD-10-CM | POA: Diagnosis not present

## 2016-02-20 DIAGNOSIS — E7800 Pure hypercholesterolemia, unspecified: Secondary | ICD-10-CM

## 2016-02-20 DIAGNOSIS — R0600 Dyspnea, unspecified: Secondary | ICD-10-CM

## 2016-02-20 DIAGNOSIS — I10 Essential (primary) hypertension: Secondary | ICD-10-CM

## 2016-02-20 NOTE — Patient Instructions (Signed)
Medication Instructions:   NO CHANGE  Testing/Procedures:  Your physician has requested that you have an echocardiogram. Echocardiography is a painless test that uses sound waves to create images of your heart. It provides your doctor with information about the size and shape of your heart and how well your heart's chambers and valves are working. This procedure takes approximately one hour. There are no restrictions for this procedure.   Your physician has requested that you have a lexiscan myoview. For further information please visit [email or web address]. Please follow instruction sheet, as given.    Follow-Up:  Your physician recommends that you schedule a follow-up appointment in: AS NEEDED PENDING TEST RESULTS      

## 2016-02-28 ENCOUNTER — Telehealth (HOSPITAL_COMMUNITY): Payer: Self-pay

## 2016-02-28 NOTE — Telephone Encounter (Signed)
Encounter complete. 

## 2016-02-29 ENCOUNTER — Telehealth (HOSPITAL_COMMUNITY): Payer: Self-pay

## 2016-02-29 NOTE — Telephone Encounter (Signed)
Encounter complete. 

## 2016-03-01 ENCOUNTER — Inpatient Hospital Stay (HOSPITAL_COMMUNITY): Admission: RE | Admit: 2016-03-01 | Payer: BLUE CROSS/BLUE SHIELD | Source: Ambulatory Visit

## 2016-03-02 ENCOUNTER — Ambulatory Visit (INDEPENDENT_AMBULATORY_CARE_PROVIDER_SITE_OTHER): Payer: BLUE CROSS/BLUE SHIELD | Admitting: Family Medicine

## 2016-03-02 ENCOUNTER — Ambulatory Visit: Payer: BLUE CROSS/BLUE SHIELD

## 2016-03-02 ENCOUNTER — Ambulatory Visit (INDEPENDENT_AMBULATORY_CARE_PROVIDER_SITE_OTHER): Payer: BLUE CROSS/BLUE SHIELD | Admitting: *Deleted

## 2016-03-02 ENCOUNTER — Ambulatory Visit (HOSPITAL_COMMUNITY): Payer: BLUE CROSS/BLUE SHIELD

## 2016-03-02 DIAGNOSIS — I2782 Chronic pulmonary embolism: Secondary | ICD-10-CM

## 2016-03-02 DIAGNOSIS — F411 Generalized anxiety disorder: Secondary | ICD-10-CM

## 2016-03-02 DIAGNOSIS — Z7901 Long term (current) use of anticoagulants: Secondary | ICD-10-CM | POA: Diagnosis not present

## 2016-03-02 DIAGNOSIS — R05 Cough: Secondary | ICD-10-CM | POA: Diagnosis not present

## 2016-03-02 DIAGNOSIS — R059 Cough, unspecified: Secondary | ICD-10-CM

## 2016-03-02 DIAGNOSIS — J020 Streptococcal pharyngitis: Secondary | ICD-10-CM

## 2016-03-02 LAB — POCT RAPID STREP A (OFFICE): Rapid Strep A Screen: POSITIVE — AB

## 2016-03-02 LAB — POCT INR: INR: 1.6

## 2016-03-02 MED ORDER — AMOXICILLIN-POT CLAVULANATE 875-125 MG PO TABS: 1.0000 | ORAL_TABLET | Freq: Two times a day (BID) | ORAL | 0 refills | Status: DC

## 2016-03-02 MED ORDER — ALBUTEROL SULFATE (2.5 MG/3ML) 0.083% IN NEBU
2.5000 mg | INHALATION_SOLUTION | Freq: Once | RESPIRATORY_TRACT | Status: AC
  Administered 2016-03-02: 2.5 mg via RESPIRATORY_TRACT

## 2016-03-02 MED ORDER — HYDROCODONE-ACETAMINOPHEN 5-325 MG PO TABS: 1.0000 | ORAL_TABLET | Freq: Two times a day (BID) | ORAL | 0 refills | Status: DC | PRN

## 2016-03-02 MED ORDER — ALPRAZOLAM 0.5 MG PO TABS: 0.5000 mg | ORAL_TABLET | Freq: Three times a day (TID) | ORAL | 2 refills | Status: DC | PRN

## 2016-03-02 MED ORDER — DEXTROMETHORPHAN-GUAIFENESIN 10-100 MG/5ML PO SYRP: 5.0000 mL | ORAL_SOLUTION | Freq: Two times a day (BID) | ORAL | 0 refills | Status: DC

## 2016-03-02 NOTE — Progress Notes (Signed)
COUGH Started 2 weeks ago. Seemed to start with sinus drainage. Coughing and wheezing. Occasionally feels hot and clammy.   Has been coughing for ~14 days. Cough is: dry Sputum production: no Medications tried: no Taking blood pressure medications: yes, ACE and HCTZ  Symptoms Runny nose: yes  Mucous in back of throat: no Throat burning or reflux: no Wheezing or asthma: yes Fever: no Chest Pain: no Shortness of breath: yes Leg swelling: no Hemoptysis: no Weight loss: no  ROS see HPI Smoking Status noted  CC, SH/smoking status, and VS noted  Objective: There were no vitals taken for this visit. Gen: NAD, alert, cooperative, and pleasant. Appears uncomfortable. HEENT: NCAT, EOMI, PERRL, OP erythematous without evidence of exudate, TMs clear bilaterally, no LAD, MMM, neck full ROM; voice is hoarse CV: RRR, no murmur Resp: CTAB, no wheezes, non-labored Ext: No edema, warm   Assessment and plan:  Acute streptococcal pharyngitis Patient is here with signs and symptoms consistent with upper respiratory infection. Rapid strep test was positive. No lower respiratory symptoms at this time. Patient appears fairly uncomfortable but stable. No significant improvement with albuterol treatment. - Augmentin twice a day 10 days - NSAID/Tylenol as needed for fever and discomfort - Adequate hydration - Follow-up in 7 days for reevaluation  Chronic pulmonary embolism ([place]) On warfarin. INR was below goal of 2-3. - Although Augmentin does not directly affect the efficacy of warfarin, the effect it has on the gut flora should cause a slight bump in INR while she is on this medication. Will repeat INR in 1 week at her reevaluation visit. - No change in warfarin dosing   Orders Placed This Encounter  Procedures  . POCT rapid strep A    Meds ordered this encounter  Medications  . amoxicillin-clavulanate (AUGMENTIN) 875-125 MG tablet    Sig: Take 1 tablet by mouth 2 (two) times daily.     Dispense:  20 tablet    Refill:  0  . ALPRAZolam (XANAX) 0.5 MG tablet    Sig: Take 1 tablet (0.5 mg total) by mouth 3 (three) times daily as needed for anxiety.    Dispense:  90 tablet    Refill:  2  . HYDROcodone-acetaminophen (NORCO) 5-325 MG tablet    Sig: Take 1 tablet by mouth 2 (two) times daily as needed for moderate pain.    Dispense:  20 tablet    Refill:  0  . Dextromethorphan-Guaifenesin 10-100 MG/5ML liquid    Sig: Take 5 mLs by mouth every 12 (twelve) hours.    Dispense:  236 mL    Refill:  0  . albuterol (PROVENTIL) (2.5 MG/3ML) 0.083% nebulizer solution 2.5 mg     Elberta Leatherwood, MD,MS,  PGY3 03/02/2016 6:44 PM

## 2016-03-02 NOTE — Assessment & Plan Note (Signed)
On warfarin. INR was below goal of 2-3. - Although Augmentin does not directly affect the efficacy of warfarin, the effect it has on the gut flora should cause a slight bump in INR while she is on this medication. Will repeat INR in 1 week at her reevaluation visit. - No change in warfarin dosing

## 2016-03-02 NOTE — Patient Instructions (Signed)
It was a pleasure seeing you today in our clinic. Today we discussed your cough and breathing. Here is the treatment plan we have discussed and agreed upon together:   - I started you on Augmentin. Take 1 tablet twice a day for the next 10 days. Take this with food and plenty of water. - Use your albuterol inhaler as needed for as long as it seems to help your breathing. - Take Tylenol as needed for body aches or fevers. - I would like for you to stop taking your lisinopril-hydrochlorothiazide over the next 1 week. If your blood pressure is back up at the next visit you can restart taking this at that point. - I will like to see you back for reevaluation this time next week. If I'm not available then it is okay for you to see one of my colleagues.

## 2016-03-02 NOTE — Assessment & Plan Note (Signed)
Patient is here with signs and symptoms consistent with upper respiratory infection. Rapid strep test was positive. No lower respiratory symptoms at this time. Patient appears fairly uncomfortable but stable. No significant improvement with albuterol treatment. - Augmentin twice a day 10 days - NSAID/Tylenol as needed for fever and discomfort - Adequate hydration - Follow-up in 7 days for reevaluation

## 2016-03-08 ENCOUNTER — Ambulatory Visit (HOSPITAL_COMMUNITY): Payer: BLUE CROSS/BLUE SHIELD

## 2016-03-08 ENCOUNTER — Telehealth (HOSPITAL_COMMUNITY): Payer: Self-pay

## 2016-03-08 NOTE — Telephone Encounter (Signed)
Encounter complete. 

## 2016-03-09 ENCOUNTER — Ambulatory Visit (INDEPENDENT_AMBULATORY_CARE_PROVIDER_SITE_OTHER): Payer: BLUE CROSS/BLUE SHIELD | Admitting: Family Medicine

## 2016-03-09 ENCOUNTER — Ambulatory Visit (HOSPITAL_COMMUNITY): Payer: BLUE CROSS/BLUE SHIELD

## 2016-03-09 ENCOUNTER — Ambulatory Visit (HOSPITAL_COMMUNITY): Payer: BLUE CROSS/BLUE SHIELD | Attending: Internal Medicine

## 2016-03-09 ENCOUNTER — Encounter: Payer: Self-pay | Admitting: Family Medicine

## 2016-03-09 ENCOUNTER — Ambulatory Visit
Admission: RE | Admit: 2016-03-09 | Discharge: 2016-03-09 | Disposition: A | Discharge status: Home / Self Care | Payer: BLUE CROSS/BLUE SHIELD | Source: Ambulatory Visit | Attending: Family Medicine | Admitting: Family Medicine

## 2016-03-09 ENCOUNTER — Ambulatory Visit (INDEPENDENT_AMBULATORY_CARE_PROVIDER_SITE_OTHER): Payer: BLUE CROSS/BLUE SHIELD | Admitting: *Deleted

## 2016-03-09 ENCOUNTER — Other Ambulatory Visit: Payer: Self-pay

## 2016-03-09 VITALS — BP 130/64 | HR 102 | Temp 98.1°F | Ht 66.0 in | Wt 250.2 lb

## 2016-03-09 DIAGNOSIS — J189 Pneumonia, unspecified organism: Secondary | ICD-10-CM | POA: Diagnosis not present

## 2016-03-09 DIAGNOSIS — R0600 Dyspnea, unspecified: Secondary | ICD-10-CM | POA: Diagnosis not present

## 2016-03-09 DIAGNOSIS — R0602 Shortness of breath: Secondary | ICD-10-CM

## 2016-03-09 DIAGNOSIS — Z7901 Long term (current) use of anticoagulants: Secondary | ICD-10-CM | POA: Diagnosis not present

## 2016-03-09 LAB — POCT INR: INR: 3

## 2016-03-09 MED ORDER — PREDNISONE 50 MG PO TABS: 50.0000 mg | ORAL_TABLET | Freq: Every day | ORAL | 0 refills | Status: DC

## 2016-03-09 MED ORDER — DOXYCYCLINE HYCLATE 100 MG PO TABS: 100.0000 mg | ORAL_TABLET | Freq: Two times a day (BID) | ORAL | 0 refills | Status: DC

## 2016-03-09 NOTE — Patient Instructions (Addendum)
It was a pleasure seeing you today in our clinic. Today we discussed your cough and shortness of breath. Here is the treatment plan we have discussed and agreed upon together:   - Stop taking the Augmentin I prescribed do at the last visit. - I would like for you to start taking doxycycline. Take 1 tablet twice a day over the next 10 days. - I also prescribed do prednisone. Take 1 tablet a day until the bottle is empty. - I have ordered a chest x-ray. Please get this taken before the end of the day today. - I would like to have you follow up to have an INR check on Monday. - I would like to have you follow-up with me one week from today.

## 2016-03-09 NOTE — Progress Notes (Signed)
   HPI  CC: Persistent cough Issues here for follow-up for cough. At the last visit she was diagnosed with suspected community acquired pneumonia. She states that since last visit she has not had any improvement on the Augmentin that had been previously prescribed. She denies any significant changes in her symptoms other than the fact that it has persisted and feels a little "deeper" than it had previously. Continues to have some sputum production and persistent cough. No chest pain lightheadedness, headache, nausea, vomiting, or diarrhea. No fevers. Persistent rhinorrhea with slightly sore throat. Appetite is diminished but she is able to drink well without issue. No new peripheral edema.  Review of Systems    See HPI for ROS. All other systems reviewed and are negative.  CC, SH/smoking status, and VS noted  Objective: BP 130/64   Pulse (!) 102   Temp 98.1 F (36.7 C) (Oral)   Ht 5\' 6"  (1.676 m)   Wt 250 lb 3.2 oz (113.5 kg)   BMI 40.38 kg/m  Gen: NAD, alert, cooperative, and pleasant. Appears uncomfortable. HEENT: NCAT, EOMI, PERRL, OP erythematous without evidence of exudate, TMs clear bilaterally, no LAD, MMM, neck full ROM; voice is hoarse CV: slight tachycardia, otherwise RR, no murmur. No rubs Resp:  diffuse rhonchi in all lung fields, no wheezes, no increased work of breathing/retractions Ext: No edema, warm  Assessment and plan:  CAP (community acquired pneumonia) Patient is here with persistent cough and malaise. At the last visit exactly one week ago patient was prescribed Augmentin due to upper respiratory symptoms. Today it appears as though patient's lung exam has changed significantly as she now has diffuse rhonchi throughout. No improvement with the Augmentin. Will change antibiotics today and obtain chest x-ray. - Doxycycline twice a day 10 days - Prednisone 50 mg daily 5 days - Chest x-ray - Tylenol as needed for discomfort and fever - Adequate hydration -  Patient is to follow-up with me in one week for reevaluation - Patient is to follow-up on Monday for a INR recheck.   Orders Placed This Encounter  Procedures  . DG Chest 2 View    Standing Status:   Future    Number of Occurrences:   1    Standing Expiration Date:   05/10/2017    Order Specific Question:   Reason for Exam (SYMPTOM  OR DIAGNOSIS REQUIRED)    Answer:   SOB    Order Specific Question:   Is patient pregnant?    Answer:   No    Order Specific Question:   Preferred imaging location?    Answer:   [facility]    Meds ordered this encounter  Medications  . doxycycline (VIBRA-TABS) 100 MG tablet    Sig: Take 1 tablet (100 mg total) by mouth 2 (two) times daily.    Dispense:  20 tablet    Refill:  0  . predniSONE (DELTASONE) 50 MG tablet    Sig: Take 1 tablet (50 mg total) by mouth daily with breakfast.    Dispense:  5 tablet    Refill:  0     Elberta Leatherwood, MD,MS,  PGY3 03/09/2016 1:44 PM

## 2016-03-09 NOTE — Assessment & Plan Note (Signed)
Patient is here with persistent cough and malaise. At the last visit exactly one week ago patient was prescribed Augmentin due to upper respiratory symptoms. Today it appears as though patient's lung exam has changed significantly as she now has diffuse rhonchi throughout. No improvement with the Augmentin. Will change antibiotics today and obtain chest x-ray. - Doxycycline twice a day 10 days - Prednisone 50 mg daily 5 days - Chest x-ray - Tylenol as needed for discomfort and fever - Adequate hydration - Patient is to follow-up with me in one week for reevaluation - Patient is to follow-up on Monday for a INR recheck.

## 2016-03-12 ENCOUNTER — Ambulatory Visit (INDEPENDENT_AMBULATORY_CARE_PROVIDER_SITE_OTHER): Payer: BLUE CROSS/BLUE SHIELD | Admitting: *Deleted

## 2016-03-12 ENCOUNTER — Ambulatory Visit (HOSPITAL_COMMUNITY): Payer: BLUE CROSS/BLUE SHIELD

## 2016-03-12 ENCOUNTER — Other Ambulatory Visit: Payer: Self-pay | Admitting: Family Medicine

## 2016-03-12 DIAGNOSIS — I2782 Chronic pulmonary embolism: Secondary | ICD-10-CM | POA: Diagnosis not present

## 2016-03-12 LAB — POCT INR: INR: 2.7

## 2016-03-12 MED ORDER — BENZONATATE 200 MG PO CAPS: 200.0000 mg | ORAL_CAPSULE | Freq: Two times a day (BID) | ORAL | 0 refills | Status: DC | PRN

## 2016-03-13 ENCOUNTER — Ambulatory Visit (HOSPITAL_COMMUNITY): Payer: BLUE CROSS/BLUE SHIELD

## 2016-03-14 ENCOUNTER — Ambulatory Visit (HOSPITAL_COMMUNITY): Payer: BLUE CROSS/BLUE SHIELD

## 2016-03-15 ENCOUNTER — Other Ambulatory Visit: Payer: Self-pay | Admitting: Family Medicine

## 2016-03-15 DIAGNOSIS — E119 Type 2 diabetes mellitus without complications: Secondary | ICD-10-CM

## 2016-03-15 DIAGNOSIS — I10 Essential (primary) hypertension: Secondary | ICD-10-CM

## 2016-03-15 MED ORDER — LISINOPRIL-HYDROCHLOROTHIAZIDE 20-12.5 MG PO TABS: 1.0000 | ORAL_TABLET | Freq: Every day | ORAL | 3 refills | Status: DC

## 2016-03-15 MED ORDER — PRAVASTATIN SODIUM 40 MG PO TABS: 40.0000 mg | ORAL_TABLET | Freq: Every day | ORAL | 3 refills | Status: DC

## 2016-03-15 MED ORDER — VERAPAMIL HCL ER 120 MG PO TBCR: 120.0000 mg | EXTENDED_RELEASE_TABLET | Freq: Every day | ORAL | 3 refills | Status: DC

## 2016-03-15 MED ORDER — METFORMIN HCL 1000 MG PO TABS: 1000.0000 mg | ORAL_TABLET | Freq: Two times a day (BID) | ORAL | 3 refills | Status: DC

## 2016-03-15 NOTE — Telephone Encounter (Signed)
Pt needs 90 day supply refills on metformin, pravastatin, lisinopril-hydrochlorothiazide, and verapamil. [phone].Please advise. Thanks! ep

## 2016-03-16 ENCOUNTER — Other Ambulatory Visit (INDEPENDENT_AMBULATORY_CARE_PROVIDER_SITE_OTHER): Payer: BLUE CROSS/BLUE SHIELD | Admitting: *Deleted

## 2016-03-16 ENCOUNTER — Ambulatory Visit (INDEPENDENT_AMBULATORY_CARE_PROVIDER_SITE_OTHER): Payer: BLUE CROSS/BLUE SHIELD | Admitting: Family Medicine

## 2016-03-16 DIAGNOSIS — J189 Pneumonia, unspecified organism: Secondary | ICD-10-CM

## 2016-03-16 DIAGNOSIS — I2782 Chronic pulmonary embolism: Secondary | ICD-10-CM

## 2016-03-16 LAB — POCT INR: INR: 3.5

## 2016-03-16 MED ORDER — ONDANSETRON 8 MG PO TBDP: 8.0000 mg | ORAL_TABLET | Freq: Three times a day (TID) | ORAL | 0 refills | Status: DC | PRN

## 2016-03-16 MED ORDER — ALBUTEROL SULFATE HFA 108 (90 BASE) MCG/ACT IN AERS: INHALATION_SPRAY | RESPIRATORY_TRACT | 1 refills | Status: DC

## 2016-03-16 NOTE — Progress Notes (Signed)
   HPI  CC: Cough Follow-up on her cough. She states that she is feeling significantly better however she still has a productive cough specifically at night. She endorses a resolution of her malaise and an improvement in her shortness of breath. She no longer feels as though she is wheezing. Overall she states that she "feels much better". She would like some refills on her medications today.  She endorses some nausea that she thinks is likely due to persistently increased sputum production. She denies any fever, chills, headache, vomiting, diarrhea, abdominal pain, diaphoresis, dysuria.  Review of Systems    See HPI for ROS. All other systems reviewed and are negative.  CC, SH/smoking status, and VS noted  Objective: BP 115/80   Pulse 80   Temp 98.4 F (36.9 C) (Oral)   Wt 249 lb 2 oz (113 kg)   BMI 40.21 kg/m  Gen: NAD, alert, cooperative, and pleasant. Appears much better than at previous visit. HEENT: NCAT, EOMI, PERRL, OP erythematous, TMs clear bilaterally, no LAD, MMM CV: RRR, no murmur Resp: CTAB, no wheezes, non-labored (significant change from previous exam) Ext: No edema, warm Neuro: Alert and oriented, Speech clear, No gross deficits  Assessment and plan:  CAP (community acquired pneumonia) Improved: Patient is here for follow-up on her pneumonia. Physical exam is significantly improved from previous visit. Patient's overall appearance is also significantly improved and patient states that she feels much better. - Continue doxycycline until completion - OTC medication for symptom control. - Set expectations for persistent cough 3 weeks or more. - Follow-up as needed - Follow-up with lab for INR check next week.  Of note: Patient should be able to obtain her stress test with cardiology now that she is feeling better. This is scheduled for 03/23/16. My hope is that cardiology will clear her for her surgery. She is to contact our office if this is not the  case.   Meds ordered this encounter  Medications  . albuterol (PROAIR HFA) 108 (90 Base) MCG/ACT inhaler    Sig: USE 2 INHALATIONS EVERY 6 HOURS AS NEEDED FOR WHEEZING OR SHORTNESS OF BREATH    Dispense:  17 g    Refill:  1  . ondansetron (ZOFRAN ODT) 8 MG disintegrating tablet    Sig: Take 1 tablet (8 mg total) by mouth every 8 (eight) hours as needed for nausea or vomiting.    Dispense:  15 tablet    Refill:  0     Elberta Leatherwood, MD,MS,  PGY3 03/16/2016 7:12 PM

## 2016-03-16 NOTE — Assessment & Plan Note (Addendum)
Improved: Patient is here for follow-up on her pneumonia. Physical exam is significantly improved from previous visit. Patient's overall appearance is also significantly improved and patient states that she feels much better. - Continue doxycycline until completion - OTC medication for symptom control. - Set expectations for persistent cough 3 weeks or more. - Follow-up as needed - Follow-up with lab for INR check next week.  Of note: Patient should be able to obtain her stress test with cardiology now that she is feeling better. This is scheduled for 03/23/16. My hope is that cardiology will clear her for her surgery. She is to contact our office if this is not the case.

## 2016-03-16 NOTE — Patient Instructions (Addendum)
It was a pleasure seeing you today in our clinic. Today we discussed your cough and medications. Here is the treatment plan we have discussed and agreed upon together:   - As we discussed in clinic the change in antibiotic has done you well. Be prepared to have this cough linger for up to 3 weeks as your body heals from your pneumonia. - If you begin to develop recurring symptoms in which you are having difficulty breathing, fever, malaise, body aches as you had before please come back and see me. - Continue to stay well-hydrated. - I would like to see you back after your visit with cardiology.

## 2016-03-19 ENCOUNTER — Telehealth: Payer: Self-pay | Admitting: *Deleted

## 2016-03-19 ENCOUNTER — Ambulatory Visit (HOSPITAL_COMMUNITY): Payer: BLUE CROSS/BLUE SHIELD

## 2016-03-19 NOTE — Telephone Encounter (Signed)
Pt is needing clearance for cystoscopy, ureteroscopy, laser lithotripsy and stent placement under general anesthesia. Will forward for dr Stanford Breed review

## 2016-03-19 NOTE — Telephone Encounter (Signed)
Will need to await pending nuclear results; coumadin management per primary care [facility]

## 2016-03-20 ENCOUNTER — Telehealth (HOSPITAL_COMMUNITY): Payer: Self-pay

## 2016-03-20 NOTE — Telephone Encounter (Signed)
Encounter complete. 

## 2016-03-22 ENCOUNTER — Ambulatory Visit (HOSPITAL_COMMUNITY)
Admission: RE | Admit: 2016-03-22 | Discharge: 2016-03-22 | Disposition: A | Discharge status: Home / Self Care | Payer: BLUE CROSS/BLUE SHIELD | Source: Ambulatory Visit | Attending: Cardiology | Admitting: Cardiology

## 2016-03-22 DIAGNOSIS — R0600 Dyspnea, unspecified: Secondary | ICD-10-CM | POA: Insufficient documentation

## 2016-03-22 MED ORDER — REGADENOSON 0.4 MG/5ML IV SOLN
0.4000 mg | Freq: Once | INTRAVENOUS | Status: AC
  Administered 2016-03-22: 0.4 mg via INTRAVENOUS

## 2016-03-22 MED ORDER — AMINOPHYLLINE 25 MG/ML IV SOLN
100.0000 mg | Freq: Once | INTRAVENOUS | Status: AC
  Administered 2016-03-22: 100 mg via INTRAVENOUS

## 2016-03-22 MED ORDER — TECHNETIUM TC 99M TETROFOSMIN IV KIT
31.3000 | PACK | Freq: Once | INTRAVENOUS | Status: AC | PRN
  Administered 2016-03-22: 31.3 via INTRAVENOUS
  Filled 2016-03-22: qty 32

## 2016-03-23 ENCOUNTER — Ambulatory Visit (HOSPITAL_COMMUNITY)
Admission: RE | Admit: 2016-03-23 | Discharge: 2016-03-23 | Disposition: A | Discharge status: Home / Self Care | Payer: BLUE CROSS/BLUE SHIELD | Source: Ambulatory Visit | Attending: Cardiology | Admitting: Cardiology

## 2016-03-23 ENCOUNTER — Ambulatory Visit (INDEPENDENT_AMBULATORY_CARE_PROVIDER_SITE_OTHER): Payer: BLUE CROSS/BLUE SHIELD | Admitting: *Deleted

## 2016-03-23 ENCOUNTER — Other Ambulatory Visit (HOSPITAL_COMMUNITY): Payer: BLUE CROSS/BLUE SHIELD

## 2016-03-23 DIAGNOSIS — I2782 Chronic pulmonary embolism: Secondary | ICD-10-CM | POA: Diagnosis not present

## 2016-03-23 LAB — MYOCARDIAL PERFUSION IMAGING
CHL CUP RESTING HR STRESS: 101 {beats}/min
CSEPPHR: 139 {beats}/min
LV dias vol: 68 mL (ref 46–106)
LVSYSVOL: 21 mL
SDS: 6
SRS: 1
SSS: 7
TID: 0.73

## 2016-03-23 LAB — POCT INR: INR: 3.2

## 2016-03-23 MED ORDER — TECHNETIUM TC 99M TETROFOSMIN IV KIT
30.8000 | PACK | Freq: Once | INTRAVENOUS | Status: AC | PRN
  Administered 2016-03-23: 30.8 via INTRAVENOUS

## 2016-03-30 NOTE — Telephone Encounter (Signed)
Nuclear study nl; ok for surgery Kirk Ruths, MD

## 2016-04-03 NOTE — Telephone Encounter (Signed)
Will forward this note to the number provided. 

## 2016-04-05 ENCOUNTER — Other Ambulatory Visit: Payer: Self-pay | Admitting: Urology

## 2016-04-05 ENCOUNTER — Other Ambulatory Visit: Payer: Self-pay | Admitting: Family Medicine

## 2016-04-05 MED ORDER — LEVOTHYROXINE SODIUM 125 MCG PO TABS: 125.0000 ug | ORAL_TABLET | Freq: Every day | ORAL | 3 refills | Status: DC

## 2016-04-05 NOTE — Telephone Encounter (Signed)
Pt needs a refill on levothyroxine sent to [facility]. ep

## 2016-04-06 ENCOUNTER — Ambulatory Visit: Payer: BLUE CROSS/BLUE SHIELD

## 2016-04-12 NOTE — Patient Instructions (Signed)
HANIAH BELLIZZI  04/12/2016   Your procedure is scheduled on: 1/16  Report to [facility] Main  Entrance take [facility]  elevators to 3rd floor to  Jane at     [phone].  Call this number if you have problems the morning of surgery [phone]   Remember: ONLY 1 PERSON MAY GO WITH YOU TO SHORT STAY TO GET  READY MORNING OF [place].  Do not eat food or drink liquids :After Midnight.     Take these medicines the morning of surgery with A SIP OF WATER: albuterol inhaler if needed and bring, Xanax if needed, Zyrtec, Synthroid,  DO NOT TAKE ANY DIABETIC MEDICATIONS DAY OF YOUR SURGERY                               You may not have any metal on your body including hair pins and              piercings  Do not wear jewelry, make-up, lotions, powders or perfumes, deodorant             Do not wear nail polish.  Do not shave  48 hours prior to surgery.          Do not bring valuables to the hospital. [place].  Contacts, dentures or bridgework may not be worn into surgery.     Patients discharged the day of surgery will not be allowed to drive home.  Name and phone number of your driver:  Special Instructions: N/A              Please read over the following fact sheets you were given: _____________________________________________________________________             [facility] - Preparing for Surgery Before surgery, you can play an important role.  Because skin is not sterile, your skin needs to be as free of germs as possible.  You can reduce the number of germs on your skin by washing with CHG (chlorahexidine gluconate) soap before surgery.  CHG is an antiseptic cleaner which kills germs and bonds with the skin to continue killing germs even after washing. Please DO NOT use if you have an allergy to CHG or antibacterial soaps.  If your skin becomes reddened/irritated stop using the CHG and inform your  nurse when you arrive at Short Stay. Do not shave (including legs and underarms) for at least 48 hours prior to the first CHG shower.  You may shave your face/neck. Please follow these instructions carefully:  1.  Shower with CHG Soap the night before surgery and the  morning of Surgery.  2.  If you choose to wash your hair, wash your hair first as usual with your  normal  shampoo.  3.  After you shampoo, rinse your hair and body thoroughly to remove the  shampoo.                           4.  Use CHG as you would any other liquid soap.  You can apply chg directly  to the skin and wash  Gently with a scrungie or clean washcloth.  5.  Apply the CHG Soap to your body ONLY FROM THE NECK DOWN.   Do not use on face/ open                           Wound or open sores. Avoid contact with eyes, ears mouth and genitals (private parts).                       Wash face,  Genitals (private parts) with your normal soap.             6.  Wash thoroughly, paying special attention to the area where your surgery  will be performed.  7.  Thoroughly rinse your body with warm water from the neck down.  8.  DO NOT shower/wash with your normal soap after using and rinsing off  the CHG Soap.                9.  Pat yourself dry with a clean towel.            10.  Wear clean pajamas.            11.  Place clean sheets on your bed the night of your first shower and do not  sleep with pets. Day of Surgery : Do not apply any lotions/deodorants the morning of surgery.  Please wear clean clothes to the hospital/surgery center.  FAILURE TO FOLLOW THESE INSTRUCTIONS MAY RESULT IN THE CANCELLATION OF YOUR SURGERY PATIENT SIGNATURE_________________________________  NURSE SIGNATURE__________________________________  ________________________________________________________________________

## 2016-04-13 ENCOUNTER — Ambulatory Visit: Payer: BLUE CROSS/BLUE SHIELD

## 2016-04-13 ENCOUNTER — Encounter (HOSPITAL_COMMUNITY)
Admission: RE | Admit: 2016-04-13 | Discharge: 2016-04-13 | Disposition: A | Discharge status: Home / Self Care | Payer: BLUE CROSS/BLUE SHIELD | Source: Ambulatory Visit | Attending: Urology | Admitting: Urology

## 2016-04-13 ENCOUNTER — Encounter (HOSPITAL_COMMUNITY): Payer: Self-pay

## 2016-04-13 DIAGNOSIS — N201 Calculus of ureter: Secondary | ICD-10-CM | POA: Insufficient documentation

## 2016-04-13 DIAGNOSIS — Z01812 Encounter for preprocedural laboratory examination: Secondary | ICD-10-CM | POA: Insufficient documentation

## 2016-04-13 HISTORY — DX: Personal history of urinary calculi: Z87.442

## 2016-04-13 HISTORY — DX: Unspecified osteoarthritis, unspecified site: M19.90

## 2016-04-13 HISTORY — DX: Unspecified asthma, uncomplicated: J45.909

## 2016-04-13 HISTORY — DX: Pneumonia, unspecified organism: J18.9

## 2016-04-13 HISTORY — DX: Ventral hernia without obstruction or gangrene: K43.9

## 2016-04-13 LAB — GLUCOSE, CAPILLARY: GLUCOSE-CAPILLARY: 149 mg/dL — AB (ref 65–99)

## 2016-04-13 LAB — BASIC METABOLIC PANEL
ANION GAP: 11 (ref 5–15)
BUN: 27 mg/dL — ABNORMAL HIGH (ref 6–20)
CHLORIDE: 100 mmol/L — AB (ref 101–111)
CO2: 27 mmol/L (ref 22–32)
Calcium: 8.1 mg/dL — ABNORMAL LOW (ref 8.9–10.3)
Creatinine, Ser: 1.4 mg/dL — ABNORMAL HIGH (ref 0.44–1.00)
GFR calc non Af Amer: 40 mL/min — ABNORMAL LOW (ref >60)
GFR, EST AFRICAN AMERICAN: 46 mL/min — AB (ref >60)
Glucose, Bld: 142 mg/dL — ABNORMAL HIGH (ref 65–99)
Potassium: 4.2 mmol/L (ref 3.5–5.1)
SODIUM: 138 mmol/L (ref 135–145)

## 2016-04-13 LAB — PROTIME-INR
INR: 1.48
PROTHROMBIN TIME: 18.1 s — AB (ref 11.4–15.2)

## 2016-04-13 LAB — CBC
HCT: 37.2 % (ref 36.0–46.0)
HEMOGLOBIN: 12.1 g/dL (ref 12.0–15.0)
MCH: 28.8 pg (ref 26.0–34.0)
MCHC: 32.5 g/dL (ref 30.0–36.0)
MCV: 88.6 fL (ref 78.0–100.0)
Platelets: 245 10*3/uL (ref 150–400)
RBC: 4.2 MIL/uL (ref 3.87–5.11)
RDW: 14.7 % (ref 11.5–15.5)
WBC: 8.5 10*3/uL (ref 4.0–10.5)

## 2016-04-13 NOTE — Progress Notes (Signed)
BMP and PT/INR done 04/13/16 faxed via [facility] to Dr Pilar Jarvis.

## 2016-04-13 NOTE — Progress Notes (Signed)
EKG- 02/20/16- [facility]  Stress 03/23/16- [facility]  ECHO- 03/09/16- [facility] CXR- 03/09/16- [facility]  03/19/16- Clearance- telephone note- Dr Stanford Breed- epic

## 2016-04-14 LAB — HEMOGLOBIN A1C
Hgb A1c MFr Bld: 6.9 % — ABNORMAL HIGH (ref 4.8–5.6)
Mean Plasma Glucose: 151 mg/dL

## 2016-04-16 ENCOUNTER — Ambulatory Visit (INDEPENDENT_AMBULATORY_CARE_PROVIDER_SITE_OTHER): Payer: BLUE CROSS/BLUE SHIELD | Admitting: *Deleted

## 2016-04-16 DIAGNOSIS — I2782 Chronic pulmonary embolism: Secondary | ICD-10-CM | POA: Diagnosis not present

## 2016-04-16 LAB — POCT INR: INR: 1.1

## 2016-04-16 NOTE — Progress Notes (Signed)
Stress Test from 03/2016 with resting EKG on chart

## 2016-04-17 ENCOUNTER — Encounter (HOSPITAL_COMMUNITY)
Admission: RE | Disposition: A | Discharge status: Home / Self Care | Payer: Self-pay | Source: Ambulatory Visit | Attending: Urology

## 2016-04-17 ENCOUNTER — Ambulatory Visit (HOSPITAL_COMMUNITY): Payer: BLUE CROSS/BLUE SHIELD | Admitting: Registered Nurse

## 2016-04-17 ENCOUNTER — Ambulatory Visit (HOSPITAL_COMMUNITY)
Admission: RE | Admit: 2016-04-17 | Discharge: 2016-04-17 | Disposition: A | Discharge status: Home / Self Care | Payer: BLUE CROSS/BLUE SHIELD | Source: Ambulatory Visit | Attending: Urology | Admitting: Urology

## 2016-04-17 ENCOUNTER — Encounter (HOSPITAL_COMMUNITY): Payer: Self-pay | Admitting: *Deleted

## 2016-04-17 DIAGNOSIS — Z87442 Personal history of urinary calculi: Secondary | ICD-10-CM | POA: Insufficient documentation

## 2016-04-17 DIAGNOSIS — Z7901 Long term (current) use of anticoagulants: Secondary | ICD-10-CM | POA: Insufficient documentation

## 2016-04-17 DIAGNOSIS — Z79899 Other long term (current) drug therapy: Secondary | ICD-10-CM | POA: Diagnosis not present

## 2016-04-17 DIAGNOSIS — Z6839 Body mass index (BMI) 39.0-39.9, adult: Secondary | ICD-10-CM | POA: Insufficient documentation

## 2016-04-17 DIAGNOSIS — Z86711 Personal history of pulmonary embolism: Secondary | ICD-10-CM | POA: Diagnosis not present

## 2016-04-17 DIAGNOSIS — I10 Essential (primary) hypertension: Secondary | ICD-10-CM | POA: Insufficient documentation

## 2016-04-17 DIAGNOSIS — Z7984 Long term (current) use of oral hypoglycemic drugs: Secondary | ICD-10-CM | POA: Diagnosis not present

## 2016-04-17 DIAGNOSIS — J45909 Unspecified asthma, uncomplicated: Secondary | ICD-10-CM | POA: Diagnosis not present

## 2016-04-17 DIAGNOSIS — K8020 Calculus of gallbladder without cholecystitis without obstruction: Secondary | ICD-10-CM | POA: Diagnosis not present

## 2016-04-17 DIAGNOSIS — E7800 Pure hypercholesterolemia, unspecified: Secondary | ICD-10-CM | POA: Diagnosis not present

## 2016-04-17 DIAGNOSIS — F329 Major depressive disorder, single episode, unspecified: Secondary | ICD-10-CM | POA: Insufficient documentation

## 2016-04-17 DIAGNOSIS — E079 Disorder of thyroid, unspecified: Secondary | ICD-10-CM | POA: Insufficient documentation

## 2016-04-17 DIAGNOSIS — E119 Type 2 diabetes mellitus without complications: Secondary | ICD-10-CM | POA: Insufficient documentation

## 2016-04-17 DIAGNOSIS — F419 Anxiety disorder, unspecified: Secondary | ICD-10-CM | POA: Insufficient documentation

## 2016-04-17 DIAGNOSIS — N202 Calculus of kidney with calculus of ureter: Secondary | ICD-10-CM | POA: Diagnosis not present

## 2016-04-17 HISTORY — PX: CYSTOSCOPY WITH RETROGRADE PYELOGRAM, URETEROSCOPY AND STENT PLACEMENT: SHX5789

## 2016-04-17 LAB — GLUCOSE, CAPILLARY
Glucose-Capillary: 140 mg/dL — ABNORMAL HIGH (ref 65–99)
Glucose-Capillary: 101 mg/dL — ABNORMAL HIGH (ref 65–99)

## 2016-04-17 SURGERY — CYSTOURETEROSCOPY, WITH RETROGRADE PYELOGRAM AND STENT INSERTION
Anesthesia: General | Laterality: Right

## 2016-04-17 MED ORDER — PROPOFOL 10 MG/ML IV BOLUS
INTRAVENOUS | Status: AC
  Filled 2016-04-17: qty 20

## 2016-04-17 MED ORDER — LIDOCAINE 2% (20 MG/ML) 5 ML SYRINGE
INTRAMUSCULAR | Status: AC
  Filled 2016-04-17: qty 5

## 2016-04-17 MED ORDER — LACTATED RINGERS IV SOLN
INTRAVENOUS | Status: DC
  Administered 2016-04-17: 1000 mL via INTRAVENOUS
  Administered 2016-04-17: 13:00:00 via INTRAVENOUS

## 2016-04-17 MED ORDER — FENTANYL CITRATE (PF) 100 MCG/2ML IJ SOLN
25.0000 ug | INTRAMUSCULAR | Status: DC | PRN
  Administered 2016-04-17 (×2): 50 ug via INTRAVENOUS

## 2016-04-17 MED ORDER — CEFAZOLIN SODIUM-DEXTROSE 2-4 GM/100ML-% IV SOLN
2.0000 g | INTRAVENOUS | Status: AC
  Administered 2016-04-17: 2 g via INTRAVENOUS
  Filled 2016-04-17: qty 100

## 2016-04-17 MED ORDER — PROPOFOL 10 MG/ML IV BOLUS
INTRAVENOUS | Status: DC | PRN
  Administered 2016-04-17: 250 mg via INTRAVENOUS

## 2016-04-17 MED ORDER — HYDROCODONE-ACETAMINOPHEN 5-325 MG PO TABS: 1.0000 | ORAL_TABLET | ORAL | 0 refills | Status: DC | PRN

## 2016-04-17 MED ORDER — MIDAZOLAM HCL 2 MG/2ML IJ SOLN
INTRAMUSCULAR | Status: AC
  Filled 2016-04-17: qty 2

## 2016-04-17 MED ORDER — MIDAZOLAM HCL 5 MG/5ML IJ SOLN
INTRAMUSCULAR | Status: DC | PRN
  Administered 2016-04-17: 2 mg via INTRAVENOUS

## 2016-04-17 MED ORDER — CEFAZOLIN SODIUM-DEXTROSE 2-4 GM/100ML-% IV SOLN
INTRAVENOUS | Status: AC
  Filled 2016-04-17: qty 100

## 2016-04-17 MED ORDER — CEPHALEXIN 500 MG PO CAPS: 500.0000 mg | ORAL_CAPSULE | Freq: Three times a day (TID) | ORAL | 0 refills | Status: DC

## 2016-04-17 MED ORDER — IOHEXOL 300 MG/ML  SOLN
INTRAMUSCULAR | Status: DC | PRN
  Administered 2016-04-17: 7 mL via URETHRAL

## 2016-04-17 MED ORDER — FENTANYL CITRATE (PF) 100 MCG/2ML IJ SOLN
INTRAMUSCULAR | Status: AC
  Filled 2016-04-17: qty 2

## 2016-04-17 MED ORDER — FENTANYL CITRATE (PF) 100 MCG/2ML IJ SOLN
INTRAMUSCULAR | Status: DC | PRN
  Administered 2016-04-17: 25 ug via INTRAVENOUS
  Administered 2016-04-17: 50 ug via INTRAVENOUS

## 2016-04-17 MED ORDER — PHENYLEPHRINE 40 MCG/ML (10ML) SYRINGE FOR IV PUSH (FOR BLOOD PRESSURE SUPPORT)
PREFILLED_SYRINGE | INTRAVENOUS | Status: DC | PRN
  Administered 2016-04-17: 120 ug via INTRAVENOUS

## 2016-04-17 MED ORDER — SODIUM CHLORIDE 0.9 % IR SOLN
Status: DC | PRN
  Administered 2016-04-17: 4000 mL via INTRAVESICAL

## 2016-04-17 MED ORDER — ONDANSETRON HCL 4 MG/2ML IJ SOLN
INTRAMUSCULAR | Status: AC
  Filled 2016-04-17: qty 2

## 2016-04-17 MED ORDER — CEFAZOLIN SODIUM-DEXTROSE 2-4 GM/100ML-% IV SOLN: 2.0000 g | INTRAVENOUS | Status: DC

## 2016-04-17 MED ORDER — LIDOCAINE 2% (20 MG/ML) 5 ML SYRINGE
INTRAMUSCULAR | Status: DC | PRN
  Administered 2016-04-17: 100 mg via INTRAVENOUS

## 2016-04-17 SURGICAL SUPPLY — 18 items
BAG URO CATCHER STRL LF (MISCELLANEOUS) ×3 IMPLANT
BASKET ZERO TIP NITINOL 2.4FR (BASKET) IMPLANT
CATH INTERMIT  6FR 70CM (CATHETERS) ×3 IMPLANT
CLOTH BEACON ORANGE TIMEOUT ST (SAFETY) ×3 IMPLANT
FIBER LASER FLEXIVA 365 (UROLOGICAL SUPPLIES) IMPLANT
FIBER LASER TRAC TIP (UROLOGICAL SUPPLIES) IMPLANT
GLOVE BIO SURGEON STRL SZ7.5 (GLOVE) ×3 IMPLANT
GOWN STRL REUS W/TWL LRG LVL3 (GOWN DISPOSABLE) ×6 IMPLANT
GUIDEWIRE ANG ZIPWIRE 038X150 (WIRE) IMPLANT
GUIDEWIRE STR DUAL SENSOR (WIRE) ×3 IMPLANT
IV NS 1000ML (IV SOLUTION)
IV NS 1000ML BAXH (IV SOLUTION) IMPLANT
MANIFOLD NEPTUNE II (INSTRUMENTS) ×3 IMPLANT
PACK CYSTO (CUSTOM PROCEDURE TRAY) ×3 IMPLANT
SHEATH ACCESS URETERAL 38CM (SHEATH) IMPLANT
STENT URET 6FRX26 CONTOUR (STENTS) ×3 IMPLANT
TUBING CONNECTING 10 (TUBING) ×2 IMPLANT
TUBING CONNECTING 10' (TUBING) ×1

## 2016-04-17 NOTE — Anesthesia Preprocedure Evaluation (Addendum)
Anesthesia Evaluation  Patient identified by MRN, date of birth, ID band Patient awake    Reviewed: Allergy & Precautions, H&P , Patient's Chart, lab work & pertinent test results, reviewed documented beta blocker date and time   Airway Mallampati: II  TM Distance: >3 FB Neck ROM: full    Dental no notable dental hx.    Pulmonary asthma ,    Pulmonary exam normal breath sounds clear to auscultation       Cardiovascular hypertension,  Rhythm:regular Rate:Normal     Neuro/Psych    GI/Hepatic   Endo/Other  diabetesMorbid obesity  Renal/GU      Musculoskeletal   Abdominal   Peds  Hematology   Anesthesia Other Findings Pulmonary embolus ... Off coumadin   INR 1.1 Thyroid disease    Anxiety   Diabetes mellitus  BS 140    Hyperlipidemia   Hypertension     Asthma.... Chest clear  Pneumonia  03/2016  Ventral hernia   Arthritis         Reproductive/Obstetrics                             Anesthesia Physical Anesthesia Plan  ASA: III  Anesthesia Plan: General   Post-op Pain Management:    Induction: Intravenous  Airway Management Planned: LMA  Additional Equipment:   Intra-op Plan:   Post-operative Plan:   Informed Consent: I have reviewed the patients History and Physical, chart, labs and discussed the procedure including the risks, benefits and alternatives for the proposed anesthesia with the patient or authorized representative who has indicated his/her understanding and acceptance.   Dental Advisory Given and Dental advisory given  Plan Discussed with: CRNA and Surgeon  Anesthesia Plan Comments: (Discussed GA with LMA, possible sore throat, potential need to switch to ETT, N/V, pulmonary aspiration. Questions answered. )        Anesthesia Quick Evaluation

## 2016-04-17 NOTE — Anesthesia Postprocedure Evaluation (Signed)
Anesthesia Post Note  Patient: Allison [name]  Procedure(s) Performed: Procedure(s) (LRB): CYSTOSCOPY WITH RETROGRADE PYELOGRAM, URETEROSCOPY AND STENT PLACEMENT (Right)  Patient location during evaluation: PACU Anesthesia Type: General Level of consciousness: sedated Pain management: satisfactory to patient Vital [name] Assessment: post-procedure vital [name] reviewed and stable Respiratory status: spontaneous breathing Cardiovascular status: stable Anesthetic complications: no       Last Vitals:  Vitals:   04/17/16 1257 04/17/16 1331  BP: 124/77 (P) 114/63  Pulse:    Resp: 12 (P) 16  Temp: 37.1 C     Last Pain:  Vitals:   04/17/16 1257  TempSrc:   PainSc: 3                  Allee Busk EDWARD

## 2016-04-17 NOTE — Anesthesia Procedure Notes (Signed)
Procedure Name: LMA Insertion Date/Time: 04/17/2016 11:44 AM Performed by: Carleene Cooper A Pre-anesthesia Checklist: Patient identified, Emergency Drugs available, Suction available, Patient being monitored and Timeout performed Patient Re-evaluated:Patient Re-evaluated prior to inductionOxygen Delivery Method: Circle system utilized Preoxygenation: Pre-oxygenation with 100% oxygen Intubation Type: IV induction LMA: LMA with gastric port inserted LMA Size: 4.0 Number of attempts: 1 Placement Confirmation: positive ETCO2 and breath sounds checked- equal and bilateral Tube secured with: Tape Dental Injury: Teeth and Oropharynx as per pre-operative assessment

## 2016-04-17 NOTE — Discharge Instructions (Signed)
Drink plenty of liquids. Expect some blood in your urine. Call your doctor for fever 100.1 or greater, persistent nausea and vomiting,  Pain not relieved by medication, gross heavy blood in your urine, unable to urinate.   Remove stent in 48 hours as directed.  Follow up with Dr. Pilar Jarvis as directed   General Anesthesia, Adult, Care After These instructions provide you with information about caring for yourself after your procedure. Your health care provider may also give you more specific instructions. Your treatment has been planned according to current medical practices, but problems sometimes occur. Call your health care provider if you have any problems or questions after your procedure. What can I expect after the procedure? After the procedure, it is common to have:  Vomiting.  A sore throat.  Mental slowness. It is common to feel:  Nauseous.  Cold or shivery.  Sleepy.  Tired.  Sore or achy, even in parts of your body where you did not have surgery. Follow these instructions at home: For at least 24 hours after the procedure:  Do not:  Participate in activities where you could fall or become injured.  Drive.  Use heavy machinery.  Drink alcohol.  Take sleeping pills or medicines that cause drowsiness.  Make important decisions or sign legal documents.  Take care of children on your own.  Rest. Eating and drinking  If you vomit, drink water, juice, or soup when you can drink without vomiting.  Drink enough fluid to keep your urine clear or pale yellow.  Make sure you have little or no nausea before eating solid foods.  Follow the diet recommended by your health care provider. General instructions  Have a responsible adult stay with you until you are awake and alert.  Return to your normal activities as told by your health care provider. Ask your health care provider what activities are safe for you.  Take over-the-counter and prescription medicines only  as told by your health care provider.  If you smoke, do not smoke without supervision.  Keep all follow-up visits as told by your health care provider. This is important. Contact a health care provider if:  You continue to have nausea or vomiting at home, and medicines are not helpful.  You cannot drink fluids or start eating again.  You cannot urinate after 8-12 hours.  You develop a skin rash.  You have fever.  You have increasing redness at the site of your procedure. Get help right away if:  You have difficulty breathing.  You have chest pain.  You have unexpected bleeding.  You feel that you are having a life-threatening or urgent problem. This information is not intended to replace advice given to you by your health care provider. Make sure you discuss any questions you have with your health care provider. Document Released: 06/25/2000 Document Revised: 08/22/2015 Document Reviewed: 03/03/2015 Elsevier Interactive Patient Education  2017 [employer].

## 2016-04-17 NOTE — Transfer of Care (Signed)
Immediate Anesthesia Transfer of Care Note  Patient: DEBORAN MCKINNEY  Procedure(s) Performed: Procedure(s): CYSTOSCOPY WITH RETROGRADE PYELOGRAM, URETEROSCOPY AND STENT PLACEMENT (Right)  Patient Location: PACU  Anesthesia Type:General  Level of Consciousness: awake, alert , oriented and patient cooperative  Airway & Oxygen Therapy: Patient Spontanous Breathing and Patient connected to face mask oxygen  Post-op Assessment: Report given to RN, Post -op Vital signs reviewed and stable and Patient moving all extremities  Post vital signs: Reviewed and stable  Last Vitals:  Vitals:   04/17/16 0935 04/17/16 1215  BP: 132/71   Pulse: 96   Resp: 16   Temp: 36.4 C (P) 36.9 C    Last Pain:  Vitals:   04/17/16 1046  TempSrc:   PainSc: 4       Patients Stated Pain Goal: 4 ([id number])  Complications: No apparent anesthesia complications

## 2016-04-17 NOTE — H&P (Signed)
CC: I have kidney stones.  HPI: Allison [name] is a 61 year-old female patient who was referred by Dr. Mila Homer. Donne Hazel, MD who is here for renal calculi.  The problem is on the right side. This is not her first kidney stone. She is currently having flank pain. She denies having back pain, groin pain, nausea, vomiting, fever, and chills. She has not caught a stone in her urine strainer since her symptoms began.   She has never had surgical treatment for calculi in the past.   Patient noted to have a large right staghorn calculus filling most of the collecting system as well as a 3 x 7 mm right ureteral stone. Her GFR is currently 29. She has been having RUQ pain for a number of months. she also notes right flank pain. She has bilateral abdominal pain as well with diarrhea. She has spelled gently passed stones before. She has never seen a urologist nor has had surgical intervention. She was tentatively scheduled for lap chole but this was put on hold after the above findings. she is also pending cardiology clearance for her laparoscopic cholecystectomy. She has been given permission to stop her Coumadin for 5 days prior to surgery for her laparoscopic cholecystectomy. She is on this for PE.    Has history of endometrial cancer s/p hysterectomy.     ALLERGIES: No Allergies Ultram    MEDICATIONS: Levothyroxine Sodium 125 mcg tablet  Warfarin Sodium 4 mg tablet  ALPRAZolam 0.25 MG Oral Tablet Oral  GlyBURIDE 5 MG Oral Tablet Oral  Lisinopril-Hydrochlorothiazide 20-12.5 MG Oral Tablet Oral  MetFORMIN HCl - 1000 MG Oral Tablet Oral  Pravastatin Sodium 40 MG Oral Tablet Oral  Singulair 10 MG Oral Tablet Oral  Verapamil HCl ER 120 MG Oral Tablet Extended Release Oral  Zyrtec 10 MG TABS Oral     GU PSH: None     PSH Notes: Cesarean Section   NON-GU PSH: Cesarean Delivery Only - 2010 Hysterectomy Thyroid Surgery    GU PMH: Abdominal Pain Unspec, Abdominal pain - 2014 Acute Cystitis,  Acute cystitis without hematuria - 2014 Calculus Ureter, Calculus of ureter - 2014 Gross hematuria, Gross hematuria - 2014 Personal Hx urinary calculi, Nephrolithiasis - 2014 Stress Incontinence, M/F, Female stress incontinence - 2014      PMH Notes:  1898-04-02 00:00:00 - Note: Normal Routine History And Physical Adult  2008-08-06 15:13:45 - Note: Arthritis   NON-GU PMH: Anxiety, Anxiety - 2014 Asthma, Asthma - 2014 Personal history of other diseases of the circulatory system, History of hypertension - 2014 Personal history of other endocrine, nutritional and metabolic disease, History of diabetes mellitus - 2014, History of hypercholesterolemia, - 2014 Arthritis Depression Diabetes Type 2 GERD Hypercholesterolemia Hypertension    FAMILY HISTORY: 1 Daughter - Runs in Family 1 son - Son adopted - No Family History Family Health Status - Mother's Age - Runs In Family Family Health Status Number - Runs In Family Father Deceased At Age86 ___ - Runs In Family   SOCIAL HISTORY: Marital Status: Married Current Smoking Status: Patient has never smoked.  Has never drank.  Does not drink caffeine. 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     Notes: Marital History - Currently Married, Tobacco Use, Alcohol Use, Occupation:, Caffeine Use   REVIEW OF SYSTEMS:    GU Review Female:   Patient denies frequent urination, hard to postpone urination, burning /pain with urination, get up at night to urinate, leakage of urine, stream starts and stops, trouble  starting your stream, have to strain to urinate, and currently pregnant.  Gastrointestinal (Upper):   Patient denies indigestion/ heartburn, nausea, and vomiting.  Gastrointestinal (Lower):   Patient reports diarrhea. Patient denies constipation.  Constitutional:   Patient reports weight loss and fatigue. Patient denies fever and night sweats.  Skin:   Patient denies skin rash/ lesion and itching.  Eyes:   Patient denies blurred vision  and double vision.  Ears/ Nose/ Throat:   Patient denies sore throat and sinus problems.  Hematologic/Lymphatic:   Patient reports easy bruising. Patient denies swollen glands.  Cardiovascular:   Patient denies leg swelling and chest pains.  Respiratory:   Patient reports shortness of breath. Patient denies cough.  Endocrine:   Patient denies excessive thirst.  Musculoskeletal:   Patient reports back pain. Patient denies joint pain.  Neurological:   Patient denies headaches and dizziness.  Psychologic:   Patient reports depression and anxiety.    VITAL SIGNS: None   GU PHYSICAL EXAMINATION:    Cervix: S/P Hysterectomy  Uterus: S/P Hysterectomy   MULTI-SYSTEM PHYSICAL EXAMINATION:    Constitutional: Well-nourished. No physical deformities. Normally developed. Good grooming.  Neck: Neck symmetrical, not swollen. Normal tracheal position.  Respiratory: No labored breathing, no use of accessory muscles.   Cardiovascular: Normal temperature, normal extremity pulses, no swelling, no varicosities.  Lymphatic: No enlargement of neck, axillae, groin.  Skin: No paleness, no jaundice, no cyanosis. No lesion, no ulcer, no rash.  Neurologic / Psychiatric: Oriented to time, oriented to place, oriented to person. No depression, no anxiety, no agitation.  Gastrointestinal: No mass, no tenderness, no rigidity, non obese abdomen.  Eyes: Normal conjunctivae. Normal eyelids.  Ears, Nose, Mouth, and Throat: Left ear no scars, no lesions, no masses. Right ear no scars, no lesions, no masses. Nose no scars, no lesions, no masses. Normal hearing. Normal lips.  Musculoskeletal: Normal gait and station of head and neck.     PAST DATA REVIEWED:  Source Of History:  Patient  Records Review:   Previous Doctor Records  X-Ray Review: [facility].T. [facility] Protocol: Reviewed Films. Reviewed Report. Discussed With Patient.     PROCEDURES:          Urinalysis w/Scope Dipstick Dipstick Cont'd Micro  Color: Yellow  Bilirubin: Neg WBC/hpf: 20 - 40/hpf  Appearance: Cloudy Ketones: Neg RBC/hpf: 3 - 10/hpf  Specific Gravity: 1.025 Blood: 2+ Bacteria: Rare (0-9/hpf)  pH: <=5.0 Protein: Trace Cystals: Amorph Urates  Glucose: Neg Urobilinogen: 0.2 Casts: NS (Not Seen)    Nitrites: Neg Trichomonas: Not Present    Leukocyte Esterase: 2+ Mucous: Present      Epithelial Cells: 0 - 5/hpf      Yeast: NS (Not Seen)      Sperm: Not Present    ASSESSMENT:      ICD-10 Details  1 GU:   Kidney Stone - N20.0   2   Calculus Ureter - N20.1    PLAN:           Document Letter(s):  Created for Patient: Clinical Summary   Created for Elvera Maria, MD   Created for Mila Homer. Donne Hazel, MD    The patient and I talked about surgical treatment for their kidney stones. Etiologies of kidney stones including dehydration, poor fluid intake, intake of well water, congenital renal disease, previous bowel surgery, idiopathic, and others were discussed with the patient.   Metabolic evaluation of kidney stone disease was discussed with the patient. Alternative treatment options including increased water intake,  increased lemonade intake, and dietary moderation with calcium and oxalate containing foods were discussed with the patient in detail.   The risks, benefits, and some of the possible complications of surgical treatments including cystoscopy, ureteroscopy, renoscopy, laser lithotripsy, extracorporeal shock wave lithotripsy, stent insertion and others were discussed with the patient. All questions were answered.  The patient gave fully informed consent to proceed with a ureteroscopy with or without laser lithotripsy with stone extraction for the treatment of their kidney stones.   The patient was given instructions to call for abdominal pain, pelvic pain, perirectal pain, nausea, vomiting, diarrhea, fever over 100 degrees F, chills, hematuria, dysuria, frequency, urgency, or urge incontinence.   P: cystoscopy, right  ureteroscopy, laser litho, right ureteral stent

## 2016-04-17 NOTE — Op Note (Signed)
Date of procedure: 04/17/16  Preoperative diagnosis:  1. Right ureteral stone 2. Right staghorn calculus   Postoperative diagnosis:  1. Passed right ureteral stone 2. Right staghorn calculus   Procedure: 1. Cystoscopy 2. Right retrograde polygrams interpretation 3. Right ureteroscopy 4. Right ureteral stent placement 6 French by 26 cm with tether  Surgeon: Baruch Gouty, MD  Anesthesia: General  Complications: None  Intraoperative findings: The patient had passed her right ureteral stone on diagnostic right ureteroscopy. Her known staghorn calculus was low density on fluoroscopy and barely visible. Right retrograde pyelogram showed no tension of contrast or filling defects in the ureter but it did show significant filling defect in the right collecting system consistent with known right staghorn calculus. There is no retention of contrast on postdrainage films.  EBL: None  Specimens: None  Drains: 6 French by 26 cm right double-J ureteral stent with tether  Disposition: Stable to the postanesthesia care unit  Indication for procedure: The patient is a 61 y.o. female with a 5 mm mid right ureteral stone as well as a large right staghorn calculus who presents today for definitive management of her right ureteral stone. She is currently symptomatic cholelithiasis and a cholecystectomy is pending removal of her right ureteral stone. The plan has been to address her right staghorn calculus later date after her cholecystectomy via PCNL.  After reviewing the management options for treatment, the patient elected to proceed with the above surgical procedure(s). We have discussed the potential benefits and risks of the procedure, side effects of the proposed treatment, the likelihood of the patient achieving the goals of the procedure, and any potential problems that might occur during the procedure or recuperation. Informed consent has been obtained.  Description of procedure: The patient  was met in the preoperative area. All risks, benefits, and indications of the procedure were described in great detail. The patient consented to the procedure. Preoperative antibiotics were given. The patient was taken to the operative theater. General anesthesia was induced per the anesthesia service. The patient was then placed in the dorsal lithotomy position and prepped and draped in the usual sterile fashion. A preoperative timeout was called.   A 21 French 30 cystoscope was inserted to the patient's bladder per urethra atraumatic. The right retrograde polygrams was then obtained which showed no significant hydronephrosis or filling defect within the ureter. She has a significant filling defect within the right collecting system consistent with her known right staghorn calculus. There is no significant retention of contrast postdrainage films. Decision was made to ensure that that right ureteral stone had passed via right ureteroscopy. The semirigid ureteroscope was inserted in the patient's right ureteral orifice after placing a sensor safety wire. Pan right ureteroscopy was unremarkable for stones. The staghorn calculus was visualized with the ureteroscope was inserted into the right renal pelvis however did not appear to be fully obstructing. This is present confirmed with no retention of contrast on postdrainage films. Decision was made due to the manipulation of the ureter to leave a ureteral stent this time. The ureteroscope was withdrawn. A sensor wire was assembled over the sensor wire 6 French by 26 cm double-J ureteral stent with tether was placed. A sensor wire was removed and a curl seen prep. Sterile calculus in the right renal pelvis and in the urinary bladder trabeculation. The cystoscope was disassembled and the string was left remaining per urethra. Fluoroscopy confirmed the stone had not moved during removal of the scope. The tether was tucked into  the patient's vagina and she was woken  from anesthesia and transferred stable condition to postanesthesia care unit.  Plan: The patient will remove her stent via pulling on the tether in 48 hours. At this point she does not have ureteral obstruction. Due to her symptomatic cholelithiasis, I think at this time it is best for her to undergo her planned cholecystectomy. She'll follow-up with me in 6 weeks. Hopefully in this time frame she is able to have her cholecystectomy, and we can begin planning her right percutaneous nephrolithotomy.  Baruch Gouty, M.D.

## 2016-04-23 ENCOUNTER — Telehealth: Payer: Self-pay | Admitting: Cardiology

## 2016-04-23 ENCOUNTER — Ambulatory Visit (INDEPENDENT_AMBULATORY_CARE_PROVIDER_SITE_OTHER): Payer: BLUE CROSS/BLUE SHIELD | Admitting: *Deleted

## 2016-04-23 DIAGNOSIS — I2782 Chronic pulmonary embolism: Secondary | ICD-10-CM | POA: Diagnosis not present

## 2016-04-23 LAB — POCT INR: INR: 1.3

## 2016-04-23 NOTE — Telephone Encounter (Signed)
Request routed to provider for review.

## 2016-04-23 NOTE — Telephone Encounter (Signed)
Request for surgical clearance:  1. What type of surgery is being performed? gallbladder surgery  When is this surgery scheduled? unknown Are there any medications that need to be held prior to surgery and how long? coum 2. Name of physician performing surgery?  Dr.Wakefield  What is your office phone and fax number? Fax [phone]

## 2016-04-24 NOTE — Telephone Encounter (Signed)
Primary care manages this pts coumadin for history of pulmonary embolus; question needs to be forwarded to them. Kirk Ruths

## 2016-04-25 NOTE — Telephone Encounter (Signed)
Patients PCP is Georges Lynch MD. Will forward this note to the number provided.

## 2016-04-27 ENCOUNTER — Ambulatory Visit (INDEPENDENT_AMBULATORY_CARE_PROVIDER_SITE_OTHER): Payer: BLUE CROSS/BLUE SHIELD | Admitting: *Deleted

## 2016-04-27 ENCOUNTER — Other Ambulatory Visit: Payer: Self-pay | Admitting: General Surgery

## 2016-04-27 DIAGNOSIS — I2782 Chronic pulmonary embolism: Secondary | ICD-10-CM

## 2016-04-27 LAB — POCT INR: INR: 1.7

## 2016-05-04 ENCOUNTER — Telehealth: Payer: Self-pay | Admitting: Family Medicine

## 2016-05-04 NOTE — Telephone Encounter (Signed)
[facility]:  Surgical clearance is needed.  Also can the pt stop coumadin 5 days before surgery.  Surgery will not be scheduled until the clearance is received.  Please fax clearance letter to [phone]

## 2016-05-09 ENCOUNTER — Encounter: Payer: Self-pay | Admitting: Family Medicine

## 2016-05-09 NOTE — Progress Notes (Signed)
   To whom this may concern:  After looking over the results of patient's most recent stress test (low risk) I can feel confident saying the patient's current risk of surgical complication is relatively low, and that you can regard this as my cardiac clearance.  Due to patient's elevated CHADs VASC score (5) I would consider her at increased risk while off of her warfarin. My medical recommendation for the days leading up to patient's procedure would be for her to discontinue her warfarin 5 days prior to the surgery. She should resume her warfarin 24 hours after the procedure. Also, 3 days prior to this procedure she should initiate therapeutic doses of Lovenox, and continue this until postop day 10.  Since I do not know when she will be scheduled for this procedure, I will wait to schedule any preop visits with this patient until I am notified of her procedure date. Please let our office know if patient's surgeon would like me to discuss this treatment plan with the patient prior to her surgery.   Elberta Leatherwood, MD,MS,  PGY3 05/09/2016 7:18 PM

## 2016-05-09 NOTE — Telephone Encounter (Signed)
I have placed a note to documentation for patient's cardiac clearance in her chart. Please fax this to [facility]. I have placed the paperwork on Asha's desk  If possible please find out when patient's surgery is so I can talk with her about her preop treatment regimen at least 7 days prior to her procedure.

## 2016-05-10 NOTE — Telephone Encounter (Signed)
Letter faxed to [facility] at [phone], Attn: Mammie Lorenzo LPN. Requested that they inform us when surgery has been scheduled.

## 2016-05-11 ENCOUNTER — Ambulatory Visit (INDEPENDENT_AMBULATORY_CARE_PROVIDER_SITE_OTHER): Payer: BLUE CROSS/BLUE SHIELD | Admitting: *Deleted

## 2016-05-11 DIAGNOSIS — I2782 Chronic pulmonary embolism: Secondary | ICD-10-CM

## 2016-05-11 LAB — POCT INR: INR: 3.1

## 2016-05-18 ENCOUNTER — Ambulatory Visit (INDEPENDENT_AMBULATORY_CARE_PROVIDER_SITE_OTHER): Payer: BLUE CROSS/BLUE SHIELD | Admitting: *Deleted

## 2016-05-18 DIAGNOSIS — Z7901 Long term (current) use of anticoagulants: Secondary | ICD-10-CM

## 2016-05-18 LAB — POCT INR: INR: 1.6

## 2016-05-22 ENCOUNTER — Other Ambulatory Visit: Payer: Self-pay | Admitting: Family Medicine

## 2016-05-25 ENCOUNTER — Ambulatory Visit (INDEPENDENT_AMBULATORY_CARE_PROVIDER_SITE_OTHER): Payer: BLUE CROSS/BLUE SHIELD | Admitting: Student

## 2016-05-25 ENCOUNTER — Ambulatory Visit (INDEPENDENT_AMBULATORY_CARE_PROVIDER_SITE_OTHER): Payer: BLUE CROSS/BLUE SHIELD | Admitting: *Deleted

## 2016-05-25 ENCOUNTER — Encounter: Payer: Self-pay | Admitting: Student

## 2016-05-25 VITALS — BP 102/60 | HR 88 | Temp 98.3°F | Ht 66.0 in | Wt 244.2 lb

## 2016-05-25 DIAGNOSIS — Z7901 Long term (current) use of anticoagulants: Secondary | ICD-10-CM

## 2016-05-25 DIAGNOSIS — Z5181 Encounter for therapeutic drug level monitoring: Secondary | ICD-10-CM | POA: Insufficient documentation

## 2016-05-25 DIAGNOSIS — I2782 Chronic pulmonary embolism: Secondary | ICD-10-CM | POA: Diagnosis not present

## 2016-05-25 LAB — POCT INR: INR: 2.3

## 2016-05-25 NOTE — Progress Notes (Signed)
  Subjective:    Allison [name] is a 61 y.o. old female here to discuss anticoagulation before surgery  HPI Surgical clearance: Patient had laparoscopic cholecystectomy scheduled for 06/04/2016. She is here to discuss about her anticoagulation prior to surgery. She already had cardiac clearance. She reports history of PE about 5 years ago. She was on warfarin since then. She denies recurrence of PE or DVT. She had history of endometrial cancer status post hysterectomy years ago. She denies history of radiation or chemotherapy. She denies recurrence of cancer. She denies history of coagulopathy.   PMH/Problem List: has ADENOCARCINOMA, UTERUS; GOITER NOS; Type II diabetes mellitus with complication ([place]); HYPERLIPIDEMIA; MORBID OBESITY; Anxiety state; DEPRESSIVE DISORDER, NOS; HYPERTENSION, BENIGN SYSTEMIC; Chronic pulmonary embolism ([place]); Asthma; GERD; KNEE PAIN, BILATERAL; LOW BACK PAIN; PROTEINURIA; Long term (current) use of anticoagulants; Venous stasis dermatitis; Hypothyroid; Preventive measure; Nephrolithiasis; Leg swelling; Uric acid renal calculus; Axillary abscess; Acute bronchitis; Chronic kidney disease (CKD), stage III (moderate); Yeast vaginitis; Right foot pain; Cholelithiasis; Acute streptococcal pharyngitis; CAP (community acquired pneumonia); and Anticoagulation management encounter on her problem list.   has a past medical history of Anxiety; Arthritis; Asthma; Diabetes mellitus; History of kidney stones; Hyperlipidemia; Hypertension; Nephrolithiasis; Pneumonia; Pulmonary embolus ([place]); Thyroid disease; Uterine cancer ([place]); and Ventral hernia.  FH:  Family History  Problem Relation Age of Onset  . Adopted: Yes  . CAD Brother 67    [place] Social History  Substance Use Topics  . Smoking status: Never Smoker  . Smokeless tobacco: Never Used  . Alcohol use No    Review of Systems Review of systems negative except for pertinent positives and negatives in history of present illness  above.     Objective:     Vitals:   05/25/16 0904  BP: 102/60  Pulse: 88  Temp: 98.3 F (36.8 C)  TempSrc: Oral  SpO2: 99%  Weight: 244 lb 3.2 oz (110.8 kg)  Height: 5\' 6"  (1.676 m)   Physical Exam GEN: appears well, no apparent distress. CVS: RRR, nl S1&S2, no murmurs, no edema RESP: speaks in full sentence, no IWOB, good air movement bilaterally GI: BS present & normal, soft, NTND MSK: no focal tenderness or notable swelling in her legs SKIN: no apparent skin lesion NEURO: alert and oiented appropriately, no gross defecits  PSYCH: euthymic mood with congruent affect    Assessment and Plan:  Anticoagulation management encounter Patient with remote history of PE, chronically on warfarin. She has an upcoming laparoscopic cholecystectomy on 06/04/2016. She has already been cleared from cardiac standpoint. Her DM is well controlled. She she had history of endometrial cancer and had hysterectomy many years ago. She had no chemotherapy or radiation. She denies recurrence of DVT, PE or cancer. I have discussed patient with Dr. Valentina Lucks (our pharmacist) and Dr. Mingo Amber (the preceptor for the day). We have agreed that patient does not need bridging with Lovenox. Advised patient to stop warfarin on 05/30/2016 (5 days before surgery) and resume 24 hours after surgery. She can return to clinic 5 days after resuming warfarin for INR check and then follow-up with PCP to discuss about long term use of warfarin. She may not need to be on warfarin but defer this decision to PCP. I have also discussed about this with patient's PCP over the phone.   Return in about 3 weeks (around 06/15/2016).  Mercy Riding, MD 05/25/16 Pager: [phone]

## 2016-05-25 NOTE — Patient Instructions (Signed)
It was great seeing you today! We have addressed the following issues today 1. Medical clearance/anticoagulation: Stop taking warfarin on 05/30/2016 and resume taking it 24 hours after surgery.   I will discuss about the need for warfarin going forward with your PCP and we will let you know.   Please come back and see [place] for wellness checkup.   If we did any lab work today, and the results require attention, either me or my nurse will get in touch with you. If everything is normal, you will get a letter in mail and a message via . If you don't hear from [place] in two weeks, please give [place] a call. Otherwise, we look forward to seeing you again at your next visit. If you have any questions or concerns before then, please call the clinic at [phone].  Please bring all your medications to every doctors visit  Sign up for My Chart to have easy access to your labs results, and communication with your Primary care physician.    Please check-out at the front desk before leaving the clinic.    Take Care,   Dr. Cyndia Skeeters

## 2016-05-25 NOTE — Assessment & Plan Note (Addendum)
Patient with remote history of PE, chronically on warfarin. She has an upcoming laparoscopic cholecystectomy on 06/04/2016. She has already been cleared from cardiac standpoint. Her DM is well controlled. She she had history of endometrial cancer and had hysterectomy many years ago. She had no chemotherapy or radiation. She denies recurrence of DVT, PE or cancer. I have discussed patient with Dr. Valentina Lucks (our pharmacist) and Dr. Mingo Amber (the preceptor for the day). We have agreed that patient does not need bridging with Lovenox. Advised patient to stop warfarin on 05/30/2016 (5 days before surgery) and resume 24 hours after surgery. She can return to clinic 5 days after resuming warfarin for INR check and then follow-up with PCP to discuss about long term use of warfarin. She may not need to be on warfarin but defer this decision to PCP. I have also discussed about this with patient's PCP over the phone.

## 2016-05-29 ENCOUNTER — Encounter (HOSPITAL_COMMUNITY)
Admission: RE | Admit: 2016-05-29 | Discharge: 2016-05-29 | Disposition: A | Discharge status: Home / Self Care | Payer: BLUE CROSS/BLUE SHIELD | Source: Ambulatory Visit | Attending: General Surgery | Admitting: General Surgery

## 2016-05-29 ENCOUNTER — Encounter (HOSPITAL_COMMUNITY): Payer: Self-pay

## 2016-05-29 DIAGNOSIS — Z01812 Encounter for preprocedural laboratory examination: Secondary | ICD-10-CM | POA: Diagnosis present

## 2016-05-29 DIAGNOSIS — Z79899 Other long term (current) drug therapy: Secondary | ICD-10-CM | POA: Insufficient documentation

## 2016-05-29 DIAGNOSIS — E119 Type 2 diabetes mellitus without complications: Secondary | ICD-10-CM | POA: Insufficient documentation

## 2016-05-29 DIAGNOSIS — Z90710 Acquired absence of both cervix and uterus: Secondary | ICD-10-CM | POA: Diagnosis not present

## 2016-05-29 DIAGNOSIS — Z8542 Personal history of malignant neoplasm of other parts of uterus: Secondary | ICD-10-CM | POA: Insufficient documentation

## 2016-05-29 DIAGNOSIS — Z7984 Long term (current) use of oral hypoglycemic drugs: Secondary | ICD-10-CM | POA: Diagnosis not present

## 2016-05-29 DIAGNOSIS — I10 Essential (primary) hypertension: Secondary | ICD-10-CM | POA: Insufficient documentation

## 2016-05-29 LAB — CBC WITH DIFFERENTIAL/PLATELET
BASOS PCT: 0 %
Basophils Absolute: 0 10*3/uL (ref 0.0–0.1)
EOS ABS: 0.2 10*3/uL (ref 0.0–0.7)
Eosinophils Relative: 3 %
HCT: 36.9 % (ref 36.0–46.0)
Hemoglobin: 11.5 g/dL — ABNORMAL LOW (ref 12.0–15.0)
LYMPHS ABS: 1.7 10*3/uL (ref 0.7–4.0)
Lymphocytes Relative: 28 %
MCH: 28.2 pg (ref 26.0–34.0)
MCHC: 31.2 g/dL (ref 30.0–36.0)
MCV: 90.4 fL (ref 78.0–100.0)
MONO ABS: 0.4 10*3/uL (ref 0.1–1.0)
MONOS PCT: 6 %
NEUTROS ABS: 3.9 10*3/uL (ref 1.7–7.7)
Neutrophils Relative %: 63 %
Platelets: 226 10*3/uL (ref 150–400)
RBC: 4.08 MIL/uL (ref 3.87–5.11)
RDW: 14.3 % (ref 11.5–15.5)
WBC: 6.1 10*3/uL (ref 4.0–10.5)

## 2016-05-29 LAB — COMPREHENSIVE METABOLIC PANEL
ALBUMIN: 3.8 g/dL (ref 3.5–5.0)
ALK PHOS: 51 U/L (ref 38–126)
ALT: 11 U/L — ABNORMAL LOW (ref 14–54)
ANION GAP: 13 (ref 5–15)
AST: 15 U/L (ref 15–41)
BILIRUBIN TOTAL: 0.6 mg/dL (ref 0.3–1.2)
BUN: 24 mg/dL — ABNORMAL HIGH (ref 6–20)
CALCIUM: 7.7 mg/dL — AB (ref 8.9–10.3)
CO2: 25 mmol/L (ref 22–32)
Chloride: 104 mmol/L (ref 101–111)
Creatinine, Ser: 1.5 mg/dL — ABNORMAL HIGH (ref 0.44–1.00)
GFR calc non Af Amer: 37 mL/min — ABNORMAL LOW (ref >60)
GFR, EST AFRICAN AMERICAN: 43 mL/min — AB (ref >60)
GLUCOSE: 106 mg/dL — AB (ref 65–99)
POTASSIUM: 4.9 mmol/L (ref 3.5–5.1)
Sodium: 142 mmol/L (ref 135–145)
TOTAL PROTEIN: 7.2 g/dL (ref 6.5–8.1)

## 2016-05-29 LAB — GLUCOSE, CAPILLARY: Glucose-Capillary: 110 mg/dL — ABNORMAL HIGH (ref 65–99)

## 2016-05-29 LAB — PROTIME-INR
INR: 1.84
PROTHROMBIN TIME: 21.5 s — AB (ref 11.4–15.2)

## 2016-05-29 NOTE — Pre-Procedure Instructions (Signed)
RUBYANN GRIEB  05/29/2016      [facility] [address] [place] - [place] [address] Phone: [phone] Fax: [phone]  [id number] [place], [place] [address] Phone: [phone] Fax: [phone]  [place], [place] [address] [place] [address] Phone: [phone] Fax: [phone]  [facility] [place], [place] [address] [place] [address] Phone: [phone] Fax: [phone]    Your procedure is scheduled on Mon, Mar 5 @ 9:30 AM.  Report to [facility] at 07:30 A.M.  Call this number if you have problems the morning of surgery:  [phone]   Remember:  Do not eat food or drink liquids after midnight.  Take these medicines the morning of surgery with A SIP OF WATER Albuterol(if needed)<Bring Your Inhaler With You>,Alprazolam(Xanax),Cetirizine(Zyrtec), and Pain Pill(if needed)             Stop taking any Vitamins or Herbal Medications. No Goody's,BC's,Aleve,Advil,Motrin,or Fish Oil.      How to Manage Your Diabetes Before and After Surgery  Why is it important to control my blood sugar before and after surgery? . Improving blood sugar levels before and after surgery helps healing and can limit problems. . A way of improving blood sugar control is eating a healthy diet by: o  Eating less sugar and carbohydrates o  Increasing activity/exercise o  Talking with your doctor about reaching your blood sugar goals . High blood sugars (greater than 180 mg/dL) can raise your risk of infections and slow your recovery, so you will need to focus on controlling your diabetes during the weeks before surgery. . Make sure that the doctor who takes care of your diabetes knows about your planned surgery including the date and  location.  How do I manage my blood sugar before surgery? . Check your blood sugar at least 4 times a day, starting 2 days before surgery, to make sure that the level is not too high or low. o Check your blood sugar the morning of your surgery when you wake up and every 2 hours until you get to the Short Stay unit. . If your blood sugar is less than 70 mg/dL, you will need to treat for low blood sugar: o Do not take insulin. o Treat a low blood sugar (less than 70 mg/dL) with  cup of clear juice (cranberry or apple), 4 glucose tablets, OR glucose gel. o Recheck blood sugar in 15 minutes after treatment (to make sure it is greater than 70 mg/dL). If your blood sugar is not greater than 70 mg/dL on recheck, call [phone] for further instructions. . Report your blood sugar to the short stay nurse when you get to Short Stay.  . If you are admitted to the hospital after surgery: o Your blood sugar will be checked by the staff and you will probably be given insulin after surgery (instead of oral diabetes medicines) to make sure you have good blood sugar levels. o The goal for blood sugar control after surgery is 80-180 mg/dL.      WHAT DO I DO ABOUT MY DIABETES MEDICATION?   Marland Kitchen Do not take oral diabetes medicines (pills) the morning of surgery.  Reviewed and Endorsed by [facility] Patient Education Committee, August 2015   Do not wear  jewelry, make-up or nail polish.  Do not wear lotions, powders, or perfumes, or deoderant.  Do not shave 48 hours prior to surgery.  Men may shave face and neck.  Do not bring valuables to the hospital.  [facility] is not responsible for any belongings or valuables.  Contacts, dentures or bridgework may not be worn into surgery.  Leave your suitcase in the car.  After surgery it may be brought to your room.  For patients admitted to the hospital, discharge time will be determined by your treatment team.  Patients discharged the day of surgery will not  be allowed to drive home.   Special instructions:  [place] - Preparing for Surgery  Before surgery, you can play an important role.  Because skin is not sterile, your skin needs to be as free of germs as possible.  You can reduce the number of germs on you skin by washing with CHG (chlorahexidine gluconate) soap before surgery.  CHG is an antiseptic cleaner which kills germs and bonds with the skin to continue killing germs even after washing.  Please DO NOT use if you have an allergy to CHG or antibacterial soaps.  If your skin becomes reddened/irritated stop using the CHG and inform your nurse when you arrive at Short Stay.  Do not shave (including legs and underarms) for at least 48 hours prior to the first CHG shower.  You may shave your face.  Please follow these instructions carefully:             1.  Shower with CHG Soap the night before surgery and the                                       morning of Surgery.            2.  If you choose to wash your hair, wash your hair first as usual with your                normal shampoo.            3.  After you shampoo, rinse your hair and body thoroughly to remove the                             Shampoo.            4.  Use CHG as you would any other liquid soap.  You can apply chg directly               to the skin and wash gently with scrungie or a clean washcloth.            5.  Apply the CHG Soap to your body ONLY FROM THE NECK DOWN.          Do not use on open wounds or open sores.  Avoid contact with your eyes,             ears, mouth and genitals (private parts).  Wash genitals (private parts)        with your normal soap.            6.  Wash thoroughly, paying special attention to the area where your surgery               will be performed.  7.  Thoroughly rinse your body with warm water from the neck down.            8.  DO NOT shower/wash with your normal soap after using and rinsing off                the CHG Soap.             9.  Pat yourself dry with a clean towel.            10.  Wear clean pajamas.            11.  Place clean sheets on your bed the night of your first shower and do not           sleep with pets.  Day of Surgery  Do not apply any lotions/deoderants the morning of surgery.  Please wear clean clothes to the hospital/surgery center.   Please read over the following fact sheets that you were given. Pain Booklet, Coughing and Deep Breathing and Surgical Site Infection Prevention

## 2016-05-29 NOTE — Progress Notes (Addendum)
PCP: Dr. Alease Frame  Cardiologist: Dr. Stanford Breed  ECG: 02/20/2016 in EPIC  ECHO: 03/2016 in EPIC  Stress Test: 12//2017 in EPIC  Cardiac Cath: pt denies  Chest X-ray: 03/2016

## 2016-05-30 LAB — HEMOGLOBIN A1C
Hgb A1c MFr Bld: 6.6 % — ABNORMAL HIGH (ref 4.8–5.6)
MEAN PLASMA GLUCOSE: 143 mg/dL

## 2016-05-30 NOTE — Progress Notes (Signed)
Anesthesia Chart Review:  Pt is a 61 year old female scheduled for laparoscopic cholecystectomy on 06/04/2016 with Rolm Bookbinder, M.D.  - PCP is Georges Lynch, MD, who cleared pt for surgery in note dated 05/09/16.  - Cardiologist is Kirk Ruths, MD, who cleared pt for surgery (telephone encounter 03/19/16).   PMH includes: HTN, DM, PE, hyperlipidemia, asthma, thyroid disease, uterine cancer. Never smoker. BMI 39.5.  Medications include: Albuterol, glyburide, levothyroxine, lisinopril-HCTZ, metformin, pravastatin, verapamil, Coumadin. Pt to hold coumadin 5 days prior to surgery.   Preoperative labs reviewed.   - HbA1c 6.6, glucose 106.  - Cr 1.5, BUN 24.  Cr has ranged 1.22-1.87 over last year.  - PT/INR 21.5/1.85.  Will recheck DOS.   CXR 03/09/16: No radiographic evidence of acute cardiopulmonary disease.  EKG 02/20/16: sinus tachycardia (131 bpm).  LAD. Low voltage QRS. Cannot rule out anterior infarct, age undetermined.   Nuclear stress test 03/22/16:   Nuclear stress EF: 70%.  The left ventricular ejection fraction is hyperdynamic (>65%).  There was no ST segment deviation noted during stress.  The study is normal.  This is a low risk study.  Echo 03/09/16:  - Left ventricle: The cavity size was normal. Wall thickness was normal. Systolic function was vigorous. The estimated ejection fraction was in the range of 65% to 70%. Doppler parameters are consistent with abnormal left ventricular relaxation (grade 1 diastolic dysfunction).  If labs acceptable DOS, I anticipate pt can proceed as scheduled.   Willeen Cass, FNP-BC [facility]/Anesthesiology Phone: [phone] 05/30/2016 2:55 PM

## 2016-06-03 NOTE — Anesthesia Preprocedure Evaluation (Addendum)
Anesthesia Evaluation  Patient identified by MRN, date of birth, ID band Patient awake    Reviewed: Allergy & Precautions, H&P , NPO status , Patient's Chart, lab work & pertinent test results  Airway Mallampati: III  TM Distance: >3 FB Neck ROM: Full    Dental no notable dental hx. (+) Teeth Intact, Dental Advisory Given   Pulmonary asthma ,    Pulmonary exam normal breath sounds clear to auscultation       Cardiovascular Exercise Tolerance: Good hypertension, Pt. on medications  Rhythm:Regular Rate:Normal     Neuro/Psych Anxiety Depression negative neurological ROS  negative psych ROS   GI/Hepatic Neg liver ROS, GERD  ,  Endo/Other  diabetes, Type 2, Oral Hypoglycemic AgentsHypothyroidism Morbid obesity  Renal/GU Renal disease  negative genitourinary   Musculoskeletal  (+) Arthritis , Osteoarthritis,    Abdominal   Peds  Hematology negative hematology ROS (+)   Anesthesia Other Findings   Reproductive/Obstetrics negative OB ROS                            Anesthesia Physical Anesthesia Plan  ASA: III  Anesthesia Plan: General   Post-op Pain Management:    Induction: Intravenous  Airway Management Planned: Oral ETT  Additional Equipment:   Intra-op Plan:   Post-operative Plan: Extubation in OR  Informed Consent: I have reviewed the patients History and Physical, chart, labs and discussed the procedure including the risks, benefits and alternatives for the proposed anesthesia with the patient or authorized representative who has indicated his/her understanding and acceptance.   Dental advisory given  Plan Discussed with: CRNA  Anesthesia Plan Comments:         Anesthesia Quick Evaluation

## 2016-06-04 ENCOUNTER — Ambulatory Visit (HOSPITAL_COMMUNITY): Payer: BLUE CROSS/BLUE SHIELD | Admitting: Emergency Medicine

## 2016-06-04 ENCOUNTER — Encounter (HOSPITAL_COMMUNITY)
Admission: RE | Disposition: A | Discharge status: Home / Self Care | Payer: Self-pay | Source: Ambulatory Visit | Attending: General Surgery

## 2016-06-04 ENCOUNTER — Observation Stay (HOSPITAL_COMMUNITY)
Admission: RE | Admit: 2016-06-04 | Discharge: 2016-06-05 | Disposition: A | Discharge status: Home / Self Care | Payer: BLUE CROSS/BLUE SHIELD | Source: Ambulatory Visit | Attending: General Surgery | Admitting: General Surgery

## 2016-06-04 ENCOUNTER — Encounter (HOSPITAL_COMMUNITY): Payer: Self-pay | Admitting: *Deleted

## 2016-06-04 DIAGNOSIS — Z90710 Acquired absence of both cervix and uterus: Secondary | ICD-10-CM | POA: Diagnosis not present

## 2016-06-04 DIAGNOSIS — Z6839 Body mass index (BMI) 39.0-39.9, adult: Secondary | ICD-10-CM | POA: Insufficient documentation

## 2016-06-04 DIAGNOSIS — F419 Anxiety disorder, unspecified: Secondary | ICD-10-CM | POA: Insufficient documentation

## 2016-06-04 DIAGNOSIS — K8010 Calculus of gallbladder with chronic cholecystitis without obstruction: Principal | ICD-10-CM | POA: Insufficient documentation

## 2016-06-04 DIAGNOSIS — K219 Gastro-esophageal reflux disease without esophagitis: Secondary | ICD-10-CM | POA: Diagnosis not present

## 2016-06-04 DIAGNOSIS — E7800 Pure hypercholesterolemia, unspecified: Secondary | ICD-10-CM | POA: Diagnosis not present

## 2016-06-04 DIAGNOSIS — Z7901 Long term (current) use of anticoagulants: Secondary | ICD-10-CM | POA: Insufficient documentation

## 2016-06-04 DIAGNOSIS — Z7951 Long term (current) use of inhaled steroids: Secondary | ICD-10-CM | POA: Insufficient documentation

## 2016-06-04 DIAGNOSIS — Z7984 Long term (current) use of oral hypoglycemic drugs: Secondary | ICD-10-CM | POA: Diagnosis not present

## 2016-06-04 DIAGNOSIS — Z885 Allergy status to narcotic agent status: Secondary | ICD-10-CM | POA: Diagnosis not present

## 2016-06-04 DIAGNOSIS — J45909 Unspecified asthma, uncomplicated: Secondary | ICD-10-CM | POA: Diagnosis not present

## 2016-06-04 DIAGNOSIS — M1990 Unspecified osteoarthritis, unspecified site: Secondary | ICD-10-CM | POA: Insufficient documentation

## 2016-06-04 DIAGNOSIS — Z79899 Other long term (current) drug therapy: Secondary | ICD-10-CM | POA: Insufficient documentation

## 2016-06-04 DIAGNOSIS — K432 Incisional hernia without obstruction or gangrene: Secondary | ICD-10-CM | POA: Diagnosis not present

## 2016-06-04 DIAGNOSIS — E119 Type 2 diabetes mellitus without complications: Secondary | ICD-10-CM | POA: Insufficient documentation

## 2016-06-04 DIAGNOSIS — Z86711 Personal history of pulmonary embolism: Secondary | ICD-10-CM | POA: Diagnosis not present

## 2016-06-04 DIAGNOSIS — Z9049 Acquired absence of other specified parts of digestive tract: Secondary | ICD-10-CM

## 2016-06-04 HISTORY — DX: Personal history of other diseases of the musculoskeletal system and connective tissue: Z87.39

## 2016-06-04 HISTORY — PX: LAPAROSCOPIC CHOLECYSTECTOMY: SUR755

## 2016-06-04 HISTORY — DX: Other pulmonary embolism without acute cor pulmonale: I26.99

## 2016-06-04 HISTORY — DX: Nontoxic goiter, unspecified: E04.9

## 2016-06-04 HISTORY — DX: Hypothyroidism, unspecified: E03.9

## 2016-06-04 HISTORY — PX: CHOLECYSTECTOMY: SHX55

## 2016-06-04 HISTORY — DX: Type 2 diabetes mellitus without complications: E11.9

## 2016-06-04 HISTORY — DX: Unspecified chronic bronchitis: J42

## 2016-06-04 LAB — GLUCOSE, CAPILLARY
GLUCOSE-CAPILLARY: 197 mg/dL — AB (ref 65–99)
GLUCOSE-CAPILLARY: 250 mg/dL — AB (ref 65–99)
Glucose-Capillary: 139 mg/dL — ABNORMAL HIGH (ref 65–99)
Glucose-Capillary: 156 mg/dL — ABNORMAL HIGH (ref 65–99)
Glucose-Capillary: 254 mg/dL — ABNORMAL HIGH (ref 65–99)
Glucose-Capillary: 249 mg/dL — ABNORMAL HIGH (ref 65–99)

## 2016-06-04 LAB — PROTIME-INR
INR: 1.11
Prothrombin Time: 14.3 seconds (ref 11.4–15.2)

## 2016-06-04 SURGERY — LAPAROSCOPIC CHOLECYSTECTOMY
Anesthesia: General | Site: Abdomen

## 2016-06-04 MED ORDER — HYDROMORPHONE HCL 1 MG/ML IJ SOLN
INTRAMUSCULAR | Status: AC
  Filled 2016-06-04: qty 0.5

## 2016-06-04 MED ORDER — BUPIVACAINE-EPINEPHRINE 0.25% -1:200000 IJ SOLN
INTRAMUSCULAR | Status: DC | PRN
  Administered 2016-06-04: 15 mL

## 2016-06-04 MED ORDER — HYDROCODONE-ACETAMINOPHEN 5-325 MG PO TABS: 1.0000 | ORAL_TABLET | ORAL | Status: DC | PRN

## 2016-06-04 MED ORDER — PROPOFOL 10 MG/ML IV BOLUS
INTRAVENOUS | Status: DC | PRN
  Administered 2016-06-04: 120 mg via INTRAVENOUS

## 2016-06-04 MED ORDER — ENSURE ENLIVE PO LIQD
237.0000 mL | Freq: Two times a day (BID) | ORAL | Status: DC
  Administered 2016-06-05: 237 mL via ORAL

## 2016-06-04 MED ORDER — SIMETHICONE 80 MG PO CHEW: 40.0000 mg | CHEWABLE_TABLET | Freq: Four times a day (QID) | ORAL | Status: DC | PRN

## 2016-06-04 MED ORDER — INSULIN ASPART 100 UNIT/ML ~~LOC~~ SOLN
0.0000 [IU] | Freq: Every day | SUBCUTANEOUS | Status: DC
  Administered 2016-06-04: 2 [IU] via SUBCUTANEOUS

## 2016-06-04 MED ORDER — SODIUM CHLORIDE 0.9 % IR SOLN
Status: DC | PRN
  Administered 2016-06-04: 1000 mL

## 2016-06-04 MED ORDER — SUGAMMADEX SODIUM 500 MG/5ML IV SOLN
INTRAVENOUS | Status: AC
  Filled 2016-06-04: qty 5

## 2016-06-04 MED ORDER — WARFARIN - PHARMACIST DOSING INPATIENT: Freq: Every day | Status: DC

## 2016-06-04 MED ORDER — LIDOCAINE 2% (20 MG/ML) 5 ML SYRINGE
INTRAMUSCULAR | Status: AC
  Filled 2016-06-04: qty 5

## 2016-06-04 MED ORDER — LISINOPRIL-HYDROCHLOROTHIAZIDE 20-12.5 MG PO TABS: 1.0000 | ORAL_TABLET | Freq: Every day | ORAL | Status: DC

## 2016-06-04 MED ORDER — ROCURONIUM BROMIDE 50 MG/5ML IV SOSY
PREFILLED_SYRINGE | INTRAVENOUS | Status: AC
  Filled 2016-06-04: qty 5

## 2016-06-04 MED ORDER — FENTANYL CITRATE (PF) 100 MCG/2ML IJ SOLN: 25.0000 ug | INTRAMUSCULAR | Status: DC | PRN

## 2016-06-04 MED ORDER — PHENYLEPHRINE HCL 10 MG/ML IJ SOLN
INTRAMUSCULAR | Status: DC | PRN
  Administered 2016-06-04 (×4): 80 ug via INTRAVENOUS

## 2016-06-04 MED ORDER — LEVOTHYROXINE SODIUM 25 MCG PO TABS
125.0000 ug | ORAL_TABLET | Freq: Every day | ORAL | Status: DC
  Administered 2016-06-05: 08:00:00 125 ug via ORAL
  Filled 2016-06-04: qty 1

## 2016-06-04 MED ORDER — MONTELUKAST SODIUM 10 MG PO TABS
10.0000 mg | ORAL_TABLET | Freq: Every day | ORAL | Status: DC
  Administered 2016-06-04: 10 mg via ORAL
  Filled 2016-06-04: qty 1

## 2016-06-04 MED ORDER — CEFAZOLIN SODIUM-DEXTROSE 2-4 GM/100ML-% IV SOLN
2.0000 g | INTRAVENOUS | Status: AC
  Administered 2016-06-04: 2 g via INTRAVENOUS

## 2016-06-04 MED ORDER — ONDANSETRON HCL 4 MG/2ML IJ SOLN
4.0000 mg | Freq: Once | INTRAMUSCULAR | Status: AC | PRN
  Administered 2016-06-04: 4 mg via INTRAVENOUS

## 2016-06-04 MED ORDER — INSULIN ASPART 100 UNIT/ML ~~LOC~~ SOLN
0.0000 [IU] | Freq: Three times a day (TID) | SUBCUTANEOUS | Status: DC
  Administered 2016-06-04: 8 [IU] via SUBCUTANEOUS

## 2016-06-04 MED ORDER — MORPHINE SULFATE (PF) 2 MG/ML IV SOLN: 2.0000 mg | INTRAVENOUS | Status: DC | PRN

## 2016-06-04 MED ORDER — ONDANSETRON HCL 4 MG/2ML IJ SOLN
INTRAMUSCULAR | Status: AC
  Filled 2016-06-04: qty 2

## 2016-06-04 MED ORDER — ACETAMINOPHEN 650 MG RE SUPP
650.0000 mg | Freq: Four times a day (QID) | RECTAL | Status: DC | PRN
Start: 2016-06-04 — End: 2016-06-05

## 2016-06-04 MED ORDER — GABAPENTIN 300 MG PO CAPS
ORAL_CAPSULE | ORAL | Status: AC
  Administered 2016-06-04: 300 mg via ORAL
  Filled 2016-06-04: qty 1

## 2016-06-04 MED ORDER — DEXAMETHASONE SODIUM PHOSPHATE 10 MG/ML IJ SOLN
INTRAMUSCULAR | Status: DC | PRN
  Administered 2016-06-04: 5 mg via INTRAVENOUS

## 2016-06-04 MED ORDER — DIPHENHYDRAMINE HCL 50 MG/ML IJ SOLN
INTRAMUSCULAR | Status: AC
  Filled 2016-06-04: qty 1

## 2016-06-04 MED ORDER — DEXAMETHASONE SODIUM PHOSPHATE 10 MG/ML IJ SOLN
INTRAMUSCULAR | Status: AC
  Filled 2016-06-04: qty 1

## 2016-06-04 MED ORDER — LIDOCAINE HCL (CARDIAC) 20 MG/ML IV SOLN
INTRAVENOUS | Status: DC | PRN
  Administered 2016-06-04: 60 mg via INTRAVENOUS

## 2016-06-04 MED ORDER — PROPOFOL 10 MG/ML IV BOLUS
INTRAVENOUS | Status: AC
  Filled 2016-06-04: qty 40

## 2016-06-04 MED ORDER — ONDANSETRON HCL 4 MG/2ML IJ SOLN
INTRAMUSCULAR | Status: DC | PRN
  Administered 2016-06-04: 4 mg via INTRAVENOUS

## 2016-06-04 MED ORDER — FENTANYL CITRATE (PF) 100 MCG/2ML IJ SOLN
INTRAMUSCULAR | Status: AC
  Filled 2016-06-04: qty 4

## 2016-06-04 MED ORDER — ENOXAPARIN SODIUM 40 MG/0.4ML ~~LOC~~ SOLN
40.0000 mg | SUBCUTANEOUS | Status: DC
  Administered 2016-06-05: 40 mg via SUBCUTANEOUS
  Filled 2016-06-04: qty 0.4

## 2016-06-04 MED ORDER — CEFAZOLIN SODIUM-DEXTROSE 2-4 GM/100ML-% IV SOLN
INTRAVENOUS | Status: AC
Start: 2016-06-04 — End: 2016-06-04
  Filled 2016-06-04: qty 100

## 2016-06-04 MED ORDER — MIDAZOLAM HCL 5 MG/5ML IJ SOLN
INTRAMUSCULAR | Status: DC | PRN
  Administered 2016-06-04: 2 mg via INTRAVENOUS

## 2016-06-04 MED ORDER — PROMETHAZINE HCL 25 MG/ML IJ SOLN
6.2500 mg | Freq: Once | INTRAMUSCULAR | Status: AC
  Administered 2016-06-04: 6.25 mg via INTRAVENOUS

## 2016-06-04 MED ORDER — WARFARIN SODIUM 4 MG PO TABS
4.0000 mg | ORAL_TABLET | Freq: Once | ORAL | Status: AC
  Administered 2016-06-04: 4 mg via ORAL
  Filled 2016-06-04: qty 1

## 2016-06-04 MED ORDER — LACTATED RINGERS IV SOLN
INTRAVENOUS | Status: DC
  Administered 2016-06-04: 08:00:00 via INTRAVENOUS

## 2016-06-04 MED ORDER — GABAPENTIN 300 MG PO CAPS
300.0000 mg | ORAL_CAPSULE | ORAL | Status: AC
  Administered 2016-06-04: 300 mg via ORAL

## 2016-06-04 MED ORDER — SODIUM CHLORIDE 0.9 % IV SOLN
INTRAVENOUS | Status: DC
  Administered 2016-06-04: 50 mL/h via INTRAVENOUS
  Administered 2016-06-05: 05:00:00 via INTRAVENOUS

## 2016-06-04 MED ORDER — METFORMIN HCL 500 MG PO TABS: 1000.0000 mg | ORAL_TABLET | Freq: Two times a day (BID) | ORAL | Status: DC

## 2016-06-04 MED ORDER — FENTANYL CITRATE (PF) 100 MCG/2ML IJ SOLN
INTRAMUSCULAR | Status: DC | PRN
  Administered 2016-06-04: 25 ug via INTRAVENOUS
  Administered 2016-06-04: 100 ug via INTRAVENOUS
  Administered 2016-06-04: 25 ug via INTRAVENOUS

## 2016-06-04 MED ORDER — HYDROMORPHONE HCL 1 MG/ML IJ SOLN
0.2500 mg | INTRAMUSCULAR | Status: DC | PRN
  Administered 2016-06-04 (×2): 0.5 mg via INTRAVENOUS

## 2016-06-04 MED ORDER — IOPAMIDOL (ISOVUE-300) INJECTION 61%
INTRAVENOUS | Status: AC
  Filled 2016-06-04: qty 50

## 2016-06-04 MED ORDER — WARFARIN SODIUM 2 MG PO TABS: 2.0000 mg | ORAL_TABLET | ORAL | Status: DC

## 2016-06-04 MED ORDER — ONDANSETRON HCL 4 MG/2ML IJ SOLN: 4.0000 mg | Freq: Four times a day (QID) | INTRAMUSCULAR | Status: DC | PRN

## 2016-06-04 MED ORDER — LISINOPRIL 20 MG PO TABS
20.0000 mg | ORAL_TABLET | Freq: Every day | ORAL | Status: DC
  Filled 2016-06-04 (×2): qty 1

## 2016-06-04 MED ORDER — HYDROCHLOROTHIAZIDE 12.5 MG PO CAPS
12.5000 mg | ORAL_CAPSULE | Freq: Every day | ORAL | Status: DC
  Administered 2016-06-04 – 2016-06-05 (×2): 12.5 mg via ORAL
  Filled 2016-06-04 (×2): qty 1

## 2016-06-04 MED ORDER — ALBUTEROL SULFATE (2.5 MG/3ML) 0.083% IN NEBU: 3.0000 mL | INHALATION_SOLUTION | Freq: Four times a day (QID) | RESPIRATORY_TRACT | Status: DC | PRN

## 2016-06-04 MED ORDER — MIDAZOLAM HCL 2 MG/2ML IJ SOLN
INTRAMUSCULAR | Status: AC
Start: 2016-06-04 — End: 2016-06-04
  Filled 2016-06-04: qty 2

## 2016-06-04 MED ORDER — METFORMIN HCL 500 MG PO TABS
1000.0000 mg | ORAL_TABLET | Freq: Two times a day (BID) | ORAL | Status: DC
  Administered 2016-06-05: 1000 mg via ORAL
  Filled 2016-06-04: qty 2

## 2016-06-04 MED ORDER — MENTHOL 3 MG MT LOZG
1.0000 | LOZENGE | OROMUCOSAL | Status: DC | PRN
  Administered 2016-06-04: 3 mg via ORAL
  Filled 2016-06-04: qty 9

## 2016-06-04 MED ORDER — PHENYLEPHRINE 40 MCG/ML (10ML) SYRINGE FOR IV PUSH (FOR BLOOD PRESSURE SUPPORT)
PREFILLED_SYRINGE | INTRAVENOUS | Status: AC
  Filled 2016-06-04: qty 10

## 2016-06-04 MED ORDER — SUGAMMADEX SODIUM 500 MG/5ML IV SOLN
INTRAVENOUS | Status: DC | PRN
  Administered 2016-06-04: 250 mg via INTRAVENOUS

## 2016-06-04 MED ORDER — ACETAMINOPHEN 325 MG PO TABS
650.0000 mg | ORAL_TABLET | Freq: Four times a day (QID) | ORAL | Status: DC | PRN
  Administered 2016-06-04: 650 mg via ORAL
  Filled 2016-06-04: qty 2

## 2016-06-04 MED ORDER — ACETAMINOPHEN 500 MG PO TABS
1000.0000 mg | ORAL_TABLET | ORAL | Status: AC
  Administered 2016-06-04: 1000 mg via ORAL

## 2016-06-04 MED ORDER — DIPHENHYDRAMINE HCL 50 MG/ML IJ SOLN
INTRAMUSCULAR | Status: DC | PRN
  Administered 2016-06-04: 12.5 mg via INTRAVENOUS

## 2016-06-04 MED ORDER — VERAPAMIL HCL ER 240 MG PO TBCR
120.0000 mg | EXTENDED_RELEASE_TABLET | Freq: Every day | ORAL | Status: DC
Start: 2016-06-04 — End: 2016-06-05

## 2016-06-04 MED ORDER — EPINEPHRINE PF 1 MG/10ML IJ SOSY
PREFILLED_SYRINGE | INTRAMUSCULAR | Status: AC
  Filled 2016-06-04: qty 10

## 2016-06-04 MED ORDER — ROCURONIUM BROMIDE 100 MG/10ML IV SOLN
INTRAVENOUS | Status: DC | PRN
  Administered 2016-06-04: 40 mg via INTRAVENOUS

## 2016-06-04 MED ORDER — BUPIVACAINE-EPINEPHRINE (PF) 0.5% -1:200000 IJ SOLN
INTRAMUSCULAR | Status: AC
  Filled 2016-06-04: qty 30

## 2016-06-04 MED ORDER — 0.9 % SODIUM CHLORIDE (POUR BTL) OPTIME
TOPICAL | Status: DC | PRN
  Administered 2016-06-04: 1000 mL

## 2016-06-04 MED ORDER — ONDANSETRON 4 MG PO TBDP: 4.0000 mg | ORAL_TABLET | Freq: Four times a day (QID) | ORAL | Status: DC | PRN

## 2016-06-04 MED ORDER — BUPIVACAINE HCL (PF) 0.25 % IJ SOLN
INTRAMUSCULAR | Status: AC
  Filled 2016-06-04: qty 30

## 2016-06-04 MED ORDER — ACETAMINOPHEN 500 MG PO TABS
ORAL_TABLET | ORAL | Status: AC
  Administered 2016-06-04: 1000 mg via ORAL
  Filled 2016-06-04: qty 2

## 2016-06-04 MED ORDER — PROMETHAZINE HCL 25 MG/ML IJ SOLN
INTRAMUSCULAR | Status: AC
  Filled 2016-06-04: qty 1

## 2016-06-04 MED ORDER — ALPRAZOLAM 0.5 MG PO TABS
0.5000 mg | ORAL_TABLET | Freq: Three times a day (TID) | ORAL | Status: DC | PRN
  Administered 2016-06-04: 0.5 mg via ORAL
  Filled 2016-06-04: qty 1

## 2016-06-04 SURGICAL SUPPLY — 37 items
APPLIER CLIP 5 13 M/L LIGAMAX5 (MISCELLANEOUS) ×3
BLADE CLIPPER SURG (BLADE) IMPLANT
CANISTER SUCT 3000ML PPV (MISCELLANEOUS) ×3 IMPLANT
CHLORAPREP W/TINT 26ML (MISCELLANEOUS) ×3 IMPLANT
CLIP APPLIE 5 13 M/L LIGAMAX5 (MISCELLANEOUS) ×1 IMPLANT
CLOSURE WOUND 1/2 X4 (GAUZE/BANDAGES/DRESSINGS) ×1
COVER SURGICAL LIGHT HANDLE (MISCELLANEOUS) ×3 IMPLANT
DERMABOND ADVANCED (GAUZE/BANDAGES/DRESSINGS) ×2
DERMABOND ADVANCED .7 DNX12 (GAUZE/BANDAGES/DRESSINGS) ×1 IMPLANT
DEVICE TROCAR PUNCTURE CLOSURE (ENDOMECHANICALS) ×3 IMPLANT
ELECT REM PT RETURN 9FT ADLT (ELECTROSURGICAL) ×3
ELECTRODE REM PT RTRN 9FT ADLT (ELECTROSURGICAL) ×1 IMPLANT
GLOVE BIO SURGEON STRL SZ7 (GLOVE) ×3 IMPLANT
GLOVE BIOGEL PI IND STRL 7.5 (GLOVE) ×1 IMPLANT
GLOVE BIOGEL PI INDICATOR 7.5 (GLOVE) ×2
GOWN STRL REUS W/ TWL LRG LVL3 (GOWN DISPOSABLE) ×3 IMPLANT
GOWN STRL REUS W/TWL LRG LVL3 (GOWN DISPOSABLE) ×6
KIT BASIN OR (CUSTOM PROCEDURE TRAY) ×3 IMPLANT
KIT ROOM TURNOVER OR (KITS) ×3 IMPLANT
NS IRRIG 1000ML POUR BTL (IV SOLUTION) ×3 IMPLANT
PAD ARMBOARD 7.5X6 YLW CONV (MISCELLANEOUS) ×3 IMPLANT
POUCH RETRIEVAL ECOSAC 10 (ENDOMECHANICALS) ×1 IMPLANT
POUCH RETRIEVAL ECOSAC 10MM (ENDOMECHANICALS) ×2
SCISSORS LAP 5X35 DISP (ENDOMECHANICALS) ×3 IMPLANT
SET IRRIG TUBING LAPAROSCOPIC (IRRIGATION / IRRIGATOR) ×3 IMPLANT
SLEEVE ENDOPATH XCEL 5M (ENDOMECHANICALS) ×6 IMPLANT
SPECIMEN JAR SMALL (MISCELLANEOUS) ×3 IMPLANT
STRIP CLOSURE SKIN 1/2X4 (GAUZE/BANDAGES/DRESSINGS) ×2 IMPLANT
SUT MNCRL AB 4-0 PS2 18 (SUTURE) ×3 IMPLANT
SUT VICRYL 0 UR6 27IN ABS (SUTURE) ×3 IMPLANT
TOWEL OR 17X24 6PK STRL BLUE (TOWEL DISPOSABLE) ×3 IMPLANT
TOWEL OR 17X26 10 PK STRL BLUE (TOWEL DISPOSABLE) ×3 IMPLANT
TRAY LAPAROSCOPIC MC (CUSTOM PROCEDURE TRAY) ×3 IMPLANT
TROCAR BLADELESS 11MM (ENDOMECHANICALS) ×3 IMPLANT
TROCAR XCEL BLUNT TIP 100MML (ENDOMECHANICALS) ×3 IMPLANT
TROCAR XCEL NON-BLD 5MMX100MML (ENDOMECHANICALS) ×3 IMPLANT
TUBING INSUFFLATION (TUBING) ×3 IMPLANT

## 2016-06-04 NOTE — Progress Notes (Signed)
ANTICOAGULATION CONSULT NOTE - Initial Consult  Pharmacy Consult:  Coumadin Indication:  History of PE  Allergies  Allergen Reactions  . Oxycodone Nausea And Vomiting  . Simvastatin Other (See Comments)    REACTION: Severe leg pain  . Tramadol Hcl Nausea And Vomiting    REACTION: Felt sick    Patient Measurements: Weight: 244 lb (110.7 kg)  Vital Signs: Temp: 98.5 F (36.9 C) (03/05 1437) Temp Source: Oral (03/05 1437) BP: 99/52 (03/05 1437) Pulse Rate: 85 (03/05 1437)  Labs:  Recent Labs  06/04/16 0731  LABPROT 14.3  INR 1.11    Estimated Creatinine Clearance: 50.3 mL/min (by C-G formula based on SCr of 1.5 mg/dL (H)).   Medical History: Past Medical History:  Diagnosis Date  . Anxiety   . Arthritis    knees  . Asthma    seasonal  . Diabetes mellitus   . History of kidney stones   . Hyperlipidemia   . Hypertension   . Nephrolithiasis   . Pneumonia    03/2016  . Pulmonary embolus ([place])   . Thyroid disease   . Uterine cancer ([place])   . Ventral hernia      Assessment: 8 YOF with gallstone presented today for ex-lap with cholecystectomy.  Patient was on Coumadin PTA for history of PE and Pharmacy consulted to resume med.  Her INR is sub-therapeutic as expected since Coumadin has been on hold for surgery.  Home Coumadin dose: 4mg  daily except 2mg  MWF   Goal of Therapy:  INR 2-3    Plan:  - Coumadin 4mg  PO today - Lovenox 40mg  SQ Q24H per MD.  D/C when INR therapeutic. - Daily PT / INR   Jayonna Meyering D. Mina Marble, PharmD, BCPS Pager:  [phone] 06/04/2016, 2:46 PM

## 2016-06-04 NOTE — Anesthesia Postprocedure Evaluation (Signed)
Anesthesia Post Note  Patient: KELLINA DONOVAN  Procedure(s) Performed: Procedure(s) (LRB): LAPAROSCOPIC CHOLECYSTECTOMY (N/A)  Patient location during evaluation: PACU Anesthesia Type: General Level of consciousness: awake and alert Pain management: pain level controlled Vital Signs Assessment: post-procedure vital signs reviewed and stable Respiratory status: spontaneous breathing, nonlabored ventilation and respiratory function stable Cardiovascular status: blood pressure returned to baseline and stable Postop Assessment: no signs of nausea or vomiting Anesthetic complications: no       Last Vitals:  Vitals:   06/04/16 1415 06/04/16 1437  BP:  (!) 99/52  Pulse:  85  Resp:  18  Temp: 36.7 C 36.9 C    Last Pain:  Vitals:   06/04/16 1437  TempSrc: Oral  PainSc:                  Yoseline Andersson,W. EDMOND

## 2016-06-04 NOTE — Interval H&P Note (Signed)
History and Physical Interval Note:  06/04/2016 9:11 AM  [name]  has presented today for surgery, with the diagnosis of gallstones  The various methods of treatment have been discussed with the patient and family. After consideration of risks, benefits and other options for treatment, the patient has consented to  Procedure(s): LAPAROSCOPIC CHOLECYSTECTOMY (N/A) as a surgical intervention .  The patient's history has been reviewed, patient examined, no change in status, stable for surgery.  I have reviewed the patient's chart and labs.  Questions were answered to the patient's satisfaction.     Balen Woolum

## 2016-06-04 NOTE — Anesthesia Procedure Notes (Signed)
Procedure Name: Intubation Date/Time: 06/04/2016 9:30 AM Performed by: Valda Favia Pre-anesthesia Checklist: Patient identified, Emergency Drugs available, Suction available, Patient being monitored and Timeout performed Patient Re-evaluated:Patient Re-evaluated prior to inductionOxygen Delivery Method: Circle system utilized Preoxygenation: Pre-oxygenation with 100% oxygen Intubation Type: IV induction Ventilation: Mask ventilation without difficulty Laryngoscope Size: Mac and 4 Grade View: Grade I Tube type: Oral Tube size: 7.0 mm Number of attempts: 1 Airway Equipment and Method: Stylet Placement Confirmation: ETT inserted through vocal cords under direct vision,  positive ETCO2 and breath sounds checked- equal and bilateral Secured at: 21 cm Tube secured with: Tape Dental Injury: Teeth and Oropharynx as per pre-operative assessment

## 2016-06-04 NOTE — Discharge Instructions (Signed)
[facility] LAPAROSCOPIC SURGERY: POST OP INSTRUCTIONS  Always review your discharge instruction sheet given to you by the facility where your surgery was performed. IF YOU HAVE DISABILITY OR FAMILY LEAVE FORMS, YOU MUST BRING THEM TO THE OFFICE FOR PROCESSING.   DO NOT GIVE THEM TO YOUR DOCTOR.  1. A prescription for pain medication may be given to you upon discharge.  Take your pain medication as prescribed, if needed.  If narcotic pain medicine is not needed, then you may take acetaminophen (Tylenol), naprosyn (Alleve), or ibuprofen (Advil) as needed. 2. Take your usually prescribed medications unless otherwise directed. 3. If you need a refill on your pain medication, please contact your pharmacy.  They will contact our office to request authorization. Prescriptions will not be filled after 5pm or on week-ends. 4. You should follow a light diet the first few days after arrival home, such as soup and crackers, etc.  Be sure to include lots of fluids daily. 5. Most patients will experience some swelling and bruising in the area of the incisions.  Ice packs will help.  Swelling and bruising can take several days to resolve.  6. It is common to experience some constipation if taking pain medication after surgery.  Increasing fluid intake and taking a stool softener (such as Colace) will usually help or prevent this problem from occurring.  A mild laxative (Milk of Magnesia or Miralax) should be taken according to package instructions if there are no bowel movements after 48 hours. 7. Unless discharge instructions indicate otherwise, you may remove your bandages 48 hours after surgery, and you may shower at that time.  You may have steri-strips (small skin tapes) in place directly over the incision.  These strips should be left on the skin for 7-10 days.  If your surgeon used skin glue on the incision, you may shower in 24 hours.  The glue will flake  off over the next 2-3 weeks.  Any sutures or staples will be removed at the office during your follow-up visit. 8. ACTIVITIES:  You may resume regular (light) daily activities beginning the next day--such as daily self-care, walking, climbing stairs--gradually increasing activities as tolerated.  You may have sexual intercourse when it is comfortable.  Refrain from any heavy lifting or straining until approved by your doctor. a. You may drive when you are no longer taking prescription pain medication, you can comfortably wear a seatbelt, and you can safely maneuver your car and apply brakes. b. RETURN TO WORK:  __________________________________________________________ 9. You should see your doctor in the office for a follow-up appointment approximately 2-3 weeks after your surgery.  Make sure that you call for this appointment within a day or two after you arrive home to insure a convenient appointment time. 10. OTHER INSTRUCTIONS: __________________________________________________________________________________________________________________________ __________________________________________________________________________________________________________________________ WHEN TO CALL YOUR DOCTOR: 1. Fever over 101.0 2. Inability to urinate 3. Continued bleeding from incision. 4. Increased pain, redness, or drainage from the incision. 5. Increasing abdominal pain  The clinic staff is available to answer your questions during regular business hours.  Please dont hesitate to call and ask to speak to one of the nurses for clinical concerns.  If you have a medical emergency, go to the nearest emergency room or call 911.  A surgeon from [facility] is always on call at the hospital. [address], [place], [place], [place]  [address] ? [address] [place] [address], [place], [place]   [address] [phone] ? [phone] ? [phone] Web site:  [email or web address]     Information on my medicine - Coumadin   (Warfarin)  This medication education was reviewed with me or my healthcare representative as part of my discharge preparation.  The pharmacist that spoke with me during my hospital stay was:  Saundra Shelling, [facility]  Why was Coumadin prescribed for you? Coumadin was prescribed for you because you have a blood clot or a medical condition that can cause an increased risk of forming blood clots. Blood clots can cause serious health problems by blocking the flow of blood to the heart, lung, or brain. Coumadin can prevent harmful blood clots from forming. As a reminder your indication for Coumadin is:   Pulmonary Embolism Treatment  What test will check on my response to Coumadin? While on Coumadin (warfarin) you will need to have an INR test regularly to ensure that your dose is keeping you in the desired range. The INR (international normalized ratio) number is calculated from the result of the laboratory test called prothrombin time (PT).  If an INR APPOINTMENT HAS NOT ALREADY BEEN MADE FOR YOU please schedule an appointment to have this lab work done by your health care provider within 7 days. Your INR goal is usually a number between:  2 to 3 or your provider may give you a more narrow range like 2-2.5.  Ask your health care provider during an office visit what your goal INR is.  What  do you need to  know  About  COUMADIN? Take Coumadin (warfarin) exactly as prescribed by your healthcare provider about the same time each day.  DO NOT stop taking without talking to the doctor who prescribed the medication.  Stopping without other blood clot prevention medication to take the place of Coumadin may increase your risk of developing a new clot or stroke.  Get refills before you run out.  What do you do if you miss a dose? If you miss a dose, take it as soon as you remember on the same day then continue your regularly  scheduled regimen the next day.  Do not take two doses of Coumadin at the same time.  Important Safety Information A possible side effect of Coumadin (Warfarin) is an increased risk of bleeding. You should call your healthcare provider right away if you experience any of the following: ? Bleeding from an injury or your nose that does not stop. ? Unusual colored urine (red or dark brown) or unusual colored stools (red or black). ? Unusual bruising for unknown reasons. ? A serious fall or if you hit your head (even if there is no bleeding).  Some foods or medicines interact with Coumadin (warfarin) and might alter your response to warfarin. To help avoid this: ? Eat a balanced diet, maintaining a consistent amount of Vitamin K. ? Notify your provider about major diet changes you plan to make. ? Avoid alcohol or limit your intake to 1 drink for women and 2 drinks for men per day. (1 drink is 5 oz. wine, 12 oz. beer, or 1.5 oz. liquor.)  Make sure that ANY health care provider who prescribes medication for you knows that you are taking Coumadin (warfarin).  Also make sure the healthcare provider who is monitoring your Coumadin knows when you have started a new medication including herbals and non-prescription products.  Coumadin (Warfarin)  Major Drug Interactions  Increased Warfarin Effect Decreased Warfarin Effect  Alcohol (large quantities) Antibiotics (esp. Septra/Bactrim, Flagyl, Cipro) Amiodarone (Cordarone) Aspirin (ASA)  Cimetidine (Tagamet) Megestrol (Megace) NSAIDs (ibuprofen, naproxen, etc.) Piroxicam (Feldene) Propafenone (Rythmol SR) Propranolol (Inderal) Isoniazid (INH) Posaconazole (Noxafil) Barbiturates (Phenobarbital) Carbamazepine (Tegretol) Chlordiazepoxide (Librium) Cholestyramine (Questran) Griseofulvin Oral Contraceptives Rifampin Sucralfate (Carafate) Vitamin K   Coumadin (Warfarin) Major Herbal Interactions  Increased Warfarin Effect Decreased Warfarin  Effect  Garlic Ginseng Ginkgo biloba Coenzyme Q10 Green tea St. Johns wort    Coumadin (Warfarin) FOOD Interactions  Eat a consistent number of servings per week of foods HIGH in Vitamin K (1 serving =  cup)  Collards (cooked, or boiled & drained) Kale (cooked, or boiled & drained) Mustard greens (cooked, or boiled & drained) Parsley *serving size only =  cup Spinach (cooked, or boiled & drained) Swiss chard (cooked, or boiled & drained) Turnip greens (cooked, or boiled & drained)  Eat a consistent number of servings per week of foods MEDIUM-HIGH in Vitamin K (1 serving = 1 cup)  Asparagus (cooked, or boiled & drained) Broccoli (cooked, boiled & drained, or raw & chopped) Brussel sprouts (cooked, or boiled & drained) *serving size only =  cup Lettuce, raw (green leaf, endive, romaine) Spinach, raw Turnip greens, raw & chopped   These websites have more information on Coumadin (warfarin):  FailFactory.se; VeganReport.com.au;

## 2016-06-04 NOTE — Progress Notes (Signed)
Pt admitted to 6N18 via bed from PACU.  Pt AAO X4.  Pt on RA.  Pt has 18G to lt hand with fluids infusing.  Pt has abd lap sites X5 with adhesive strips in place C/D/I.  SCDs in place.  Report rcvd from Lawanda Cousins.  Pt has no questions at the moment.  Will continue to monitor.

## 2016-06-04 NOTE — Transfer of Care (Signed)
Immediate Anesthesia Transfer of Care Note  Patient: Allison [name]  Procedure(s) Performed: Procedure(s) with comments: LAPAROSCOPIC CHOLECYSTECTOMY (N/A) - LAPAROSCOPIC CHOLECYSTECTOMY  Patient Location: PACU  Anesthesia Type:General  Level of Consciousness: awake and alert   Airway & Oxygen Therapy: Patient Spontanous Breathing and Patient connected to nasal cannula oxygen  Post-op Assessment: Report given to RN and Post -op Vital signs reviewed and stable  Post vital signs: Reviewed and stable  Last Vitals:  Vitals:   06/04/16 1049 06/04/16 1051  BP:  (!) 112/43  Pulse:  87  Resp:  20  Temp: 36.6 C     Last Pain:  Vitals:   06/04/16 1049  TempSrc:   PainSc: Asleep         Complications: No apparent anesthesia complications

## 2016-06-04 NOTE — Op Note (Signed)
Preoperative diagnosis:symptomatic cholelithiasis Postoperative diagnosis: same as above Procedure: laparoscopic cholecystectomy  Surgeon: Dr Serita Grammes Anesthesia: general EBL: minimal Drains none Specimen gb and contents to pathology Complications: none Sponge count correct at completion Disposition to recovery stable  Indications: This is a 60 yof with gallstones and symptoms consistent with biliary colic. She has history of PEs.  She also had large staghorn calculus on right that she is being seen by urology. She continues to have symptoms referable to her gb.    Procedure: After informed consent was obtained the patient was taken to the operating room. She was given antibiotics. Sequential compression devices were on herlegs. Shewas placed under general anesthesia without complication. Her abdomen was prepped and draped in the standard sterile surgical fashion. A surgical timeout was then performed.  Due to prior midline surgery, I infiltrated marcaine in luq. I made an incision and inserted a 5 mm optiview trocar without injury. I then inserted a right sided 5 mm trocar laterally and cleared some adhesions from midline. She has a known incisional hernia that I did not approach today. There is omentum adherent to this. This will be amenable to laparoscopic repair in future.  I infiltrated marcaine above the umbilicus. I made a vertical incision and inserted a 12 mm trocar.  I then placed a 69mm epigastric trocar. I placed an additional 5 mm trocars in the right abdomen. There were some adhesions from duodenum and the omentum that were taken down bluntly. I was able to retract the gallbladder cephalad and laterally. She appeared to have chronic cholecystitis.  Eventually I was able to identify the cystic duct and the cystic artery. I then obtained the critical view of safety. I clipped and divided the cystic artery.The cystic duct was viable.I treated the cystic duct in a similar  fashion with clips. The clips traversed the duct. I then removed the gallbladder from the liver bed and placed it in a bag. I did enter into the gallbladder. I then obtained hemostasis and irrigated.I removed the 12 mm trocar. I several two0 vicryl stitchesat the umbilical incision with the endoclose device. I then desufflated the abdomen and removed all my remaining trocars. I then closed these with 4-0 Monocryl and Dermabond. She tolerated this well was extubated and transferred to the recovery room in stable condition

## 2016-06-04 NOTE — H&P (Signed)
61 yof referred by Dr Harriet Butte (Dr Georges Lynch is pcp) for gallstones. she has multiple medical issues including history of endometrial cancer s/p tah and coumadin for prior pe. she had some diarrhea for 3 weeks. she also had right upper quadrant pain and right back pain. she had received some iv fluids and then an u/s that shows gallstones otherwise normal. the diarrhea has resolved. she isnt eating much due to ruq pain and epigastric pain. this has continued and not really gotten better. She has no prior issues with this. I sent her for a ct scan that shows right sided staghorn calculus and ureteral stones. she has now undergone cystoscopy, ureteroscopy. she is due to deal with staghorn calculus. she is still having some ruq pain especially with greasy foods.    Other Problems  Anxiety Disorder  Arthritis  Asthma  Cancer  Cholelithiasis  Diabetes Mellitus  High blood pressure  Hypercholesterolemia  Kidney Stone  Oophorectomy  Bilateral. Pulmonary Embolism / Blood Clot in Legs  Thyroid Disease   Past Surgical History  Cesarean Section - 1  Hysterectomy (due to cancer) - Complete  Thyroid Surgery   Diagnostic Studies History  Colonoscopy  never Mammogram  >3 years ago Pap Smear  1-5 years ago  Allergies  OxyCODONE HCl *ANALGESICS - OPIOID*  Nausea, Vomiting. Simvastatin *ANTIHYPERLIPIDEMICS*  TraMADol HCl *ANALGESICS - OPIOID*  Vomiting, Nausea.  Medication History  ALPRAZolam (0.5MG  Tablet, Oral) Active. ProAir HFA (108 (90 Base)MCG/ACT Aerosol Soln, Inhalation) Active. ZyrTEC Allergy (10MG  Tablet, Oral) Active. GlyBURIDE (5MG  Tablet, Oral) Active. Levothyroxine Sodium (125MCG Capsule, 120mg  Oral) Active. Lisinopril-Hydrochlorothiazide (20-12.5MG  Tablet, Oral) Active. MetFORMIN HCl (1000MG  Tablet, Oral) Active. Singulair (10MG  Tablet, Oral) Active. Pravachol (40MG  Tablet, Oral) Active. Warfarin Sodium (4MG  Tablet, Oral)  Active. Medications Reconciled  Social History No alcohol use  No caffeine use  No drug use  Tobacco use  Never smoker.  Family History  Family history unknown  First Degree Relatives   Pregnancy / Birth History  Age at menarche  61 years. Age of menopause  38-50 Gravida  4 Maternal age  69-20 Para  2   Review of Systems  General Present- Appetite Loss, Chills, Fatigue and Weight Loss. Not Present- Fever, Night Sweats and Weight Gain. Skin Not Present- Change in Wart/Mole, Dryness, Hives, Jaundice, New Lesions, Non-Healing Wounds, Rash and Ulcer. HEENT Present- Seasonal Allergies. Not Present- Earache, Hearing Loss, Hoarseness, Nose Bleed, Oral Ulcers, Ringing in the Ears, Sinus Pain, Sore Throat, Visual Disturbances, Wears glasses/contact lenses and Yellow Eyes. Respiratory Present- Wheezing. Not Present- Bloody sputum, Chronic Cough, Difficulty Breathing and Snoring. Breast Not Present- Breast Mass, Breast Pain, Nipple Discharge and Skin Changes. Cardiovascular Not Present- Chest Pain, Difficulty Breathing Lying Down, Leg Cramps, Palpitations, Rapid Heart Rate, Shortness of Breath and Swelling of Extremities. Gastrointestinal Present- Abdominal Pain, Bloating, Chronic diarrhea, Excessive gas, Gets full quickly at meals and Nausea. Not Present- Bloody Stool, Change in Bowel Habits, Constipation, Difficulty Swallowing, Hemorrhoids, Indigestion, Rectal Pain and Vomiting. Female Genitourinary Not Present- Frequency, Nocturia, Painful Urination, Pelvic Pain and Urgency. Musculoskeletal Present- Back Pain. Not Present- Joint Pain, Joint Stiffness, Muscle Pain, Muscle Weakness and Swelling of Extremities. Neurological Present- Headaches and Weakness. Not Present- Decreased Memory, Fainting, Numbness, Seizures, Tingling, Tremor and Trouble walking. Psychiatric Present- Anxiety and Frequent crying. Not Present- Bipolar, Change in Sleep Pattern, Depression and Fearful. Endocrine  Present- Cold Intolerance. Not Present- Excessive Hunger, Hair Changes, Heat Intolerance, Hot flashes and New Diabetes. Hematology Present- Blood Thinners. Not Present-  Easy Bruising, Excessive bleeding, Gland problems, HIV and Persistent Infections.  Vitals  Weight: 251 lb Height: 66in Body Surface Area: 2.2 m Body Mass Index: 40.51 kg/m  Temp.: 98.15F  Pulse: 96 (Regular)  BP: 132/82 (Sitting, Left Arm, Standard) Physical Exam  General Mental Status-Alert. Orientation-Oriented X3. Eye Sclera/Conjunctiva - Bilateral-No scleral icterus. Chest and Lung Exam Chest and lung exam reveals -on auscultation, normal breath sounds, no adventitious sounds and normal vocal resonance. Cardiovascular Cardiovascular examination reveals -normal heart sounds, regular rate and rhythm with no murmurs. Abdomen Note: mild tender ruq and epigastrium, healed low midline with incisional hernia around umbilicus, nontender reducible Lymphatic Head & Neck General Head & Neck Lymphatics: Bilateral - Description - Normal.  GALLSTONES (K80.20) Story: Laparoscopic cholecystectomy I think she needs lap chole as symptoms certainly sound related to gallstones. check with pcp on coumadin at time of surgery. likely can come off do surgery, I will restart likely that night and place on lovenox if that is plan. I discussed I would avoid hernia at this time and that would need to be repaired later I discussed the procedure in detail. The patient was given Neurosurgeon. We discussed the risks and benefits of a laparoscopic cholecystectomy and possible cholangiogram including, but not limited to bleeding, infection, injury to surrounding structures such as the intestine or liver, bile leak, retained gallstones, need to convert to an open procedure, prolonged diarrhea, blood clots such as DVT, common bile duct injury, anesthesia risks, and possible need for additional procedures. The likelihood of  improvement in symptoms and return to the patient's normal status is good. We discussed the typical post-operative recovery course.

## 2016-06-05 ENCOUNTER — Encounter (HOSPITAL_COMMUNITY): Payer: Self-pay | Admitting: General Surgery

## 2016-06-05 DIAGNOSIS — K8010 Calculus of gallbladder with chronic cholecystitis without obstruction: Secondary | ICD-10-CM | POA: Diagnosis not present

## 2016-06-05 LAB — GLUCOSE, CAPILLARY
GLUCOSE-CAPILLARY: 148 mg/dL — AB (ref 65–99)
GLUCOSE-CAPILLARY: 119 mg/dL — AB (ref 65–99)
GLUCOSE-CAPILLARY: 123 mg/dL — AB (ref 65–99)

## 2016-06-05 LAB — PROTIME-INR
INR: 1.25
Prothrombin Time: 15.8 seconds — ABNORMAL HIGH (ref 11.4–15.2)

## 2016-06-05 MED ORDER — HYDROCODONE-ACETAMINOPHEN 5-325 MG PO TABS: 1.0000 | ORAL_TABLET | ORAL | 0 refills | Status: DC | PRN

## 2016-06-05 NOTE — Progress Notes (Signed)
ANTICOAGULATION CONSULT NOTE - FOLLOW UP  Pharmacy Consult:  Coumadin Indication:  History of PE  Allergies  Allergen Reactions  . Oxycodone Nausea And Vomiting  . Simvastatin Other (See Comments)    REACTION: Severe leg pain  . Tramadol Hcl Nausea And Vomiting    REACTION: Felt sick    Patient Measurements: Weight: 244 lb (110.7 kg)  Vital Signs: Temp: 97.9 F (36.6 C) (03/06 0529) Temp Source: Oral (03/06 0529) BP: 102/49 (03/06 1018) Pulse Rate: 78 (03/06 1018)  Labs:  Recent Labs  06/04/16 0731 06/05/16 0745  LABPROT 14.3 15.8*  INR 1.11 1.25    Estimated Creatinine Clearance: 50.3 mL/min (by C-G formula based on SCr of 1.5 mg/dL (H)).    Assessment: 89 YOF with gallstone presented today for ex-lap with cholecystectomy.  Patient was on Coumadin PTA for history of PE and Pharmacy consulted to resume med.  Her INR is sub-therapeutic as expected.  No bleeding reported.  Home Coumadin dose: 4mg  daily except 2mg  MWF   Goal of Therapy:  INR 2-3    Plan:  - Coumadin 6mg  PO today if still here - Lovenox 40mg  SQ Q24H per MD.  D/C when INR therapeutic. - Daily PT / INR   Jaylanie Boschee D. Mina Marble, PharmD, BCPS Pager:  [phone] 06/05/2016, 1:10 PM

## 2016-06-05 NOTE — Discharge Summary (Signed)
Physician Discharge Summary  Patient ID: Allison [name] MRN: [id number] DOB/AGE: [birth year] 61 y.o.  Admit date: 06/04/2016 Discharge date: 06/05/2016  Admission Diagnoses: Gallstones History PE Incisional hernia Kidney stones  Discharge Diagnoses:  Active Problems:   S/P laparoscopic cholecystectomy   Discharged Condition: good  Hospital Course: 31 yof s/p lap chole for symptomatic cholelithiasis. She has done well overnight and will be discharged home. Coumadin was restarted last night due to PE history and she will have INR checked in about five days per PCP  Consults: None  Significant Diagnostic Studies: none  Treatments: surgery: lap chole  Discharge Exam: Blood pressure (!) 92/53, pulse 70, temperature 97.9 F (36.6 C), temperature source Oral, resp. rate 17, weight 110.7 kg (244 lb), SpO2 97 %. abd soft nontender incisions clean   Disposition: 01-Home or Self Care   Allergies as of 06/05/2016      Reactions   Oxycodone Nausea And Vomiting   Simvastatin Other (See Comments)   REACTION: Severe leg pain   Tramadol Hcl Nausea And Vomiting   REACTION: Felt sick      Medication List    STOP taking these medications   cephALEXin 500 MG capsule Commonly known as:  KEFLEX   Dextromethorphan-Guaifenesin 10-100 MG/5ML liquid   doxycycline 100 MG tablet Commonly known as:  VIBRA-TABS     TAKE these medications   acetaminophen 500 MG tablet Commonly known as:  TYLENOL Take 1,000 mg by mouth every 8 (eight) hours as needed for mild pain or headache.   albuterol 108 (90 Base) MCG/ACT inhaler Commonly known as:  PROAIR HFA USE 2 INHALATIONS EVERY 6 HOURS AS NEEDED FOR WHEEZING OR SHORTNESS OF BREATH   PROAIR HFA 108 (90 Base) MCG/ACT inhaler Generic drug:  albuterol USE 2 INHALATIONS EVERY 6 HOURS AS NEEDED FOR WHEEZING OR SHORTNESS OF BREATH   ALPRAZolam 0.5 MG tablet Commonly known as:  XANAX Take 1 tablet (0.5 mg total) by mouth 3 (three) times daily as  needed for anxiety.   benzonatate 200 MG capsule Commonly known as:  TESSALON Take 1 capsule (200 mg total) by mouth 2 (two) times daily as needed for cough.   cetirizine 10 MG tablet Commonly known as:  ZYRTEC Take 10 mg by mouth daily.   glyBURIDE 5 MG tablet Commonly known as:  DIABETA Take 1 tablet (5 mg total) by mouth 2 (two) times daily with a meal.   HYDROcodone-acetaminophen 5-325 MG tablet Commonly known as:  NORCO Take 1 tablet by mouth every 4 (four) hours as needed for moderate pain. What changed:  Another medication with the same name was added. Make sure you understand how and when to take each.   HYDROcodone-acetaminophen 5-325 MG tablet Commonly known as:  NORCO Take 1 tablet by mouth every 4 (four) hours as needed for moderate pain. What changed:  You were already taking a medication with the same name, and this prescription was added. Make sure you understand how and when to take each.   levothyroxine 125 MCG tablet Commonly known as:  SYNTHROID, LEVOTHROID Take 1 tablet (125 mcg total) by mouth daily before breakfast.   lisinopril-hydrochlorothiazide 20-12.5 MG tablet Commonly known as:  PRINZIDE,ZESTORETIC Take 1 tablet by mouth daily.   metFORMIN 1000 MG tablet Commonly known as:  GLUCOPHAGE Take 1 tablet (1,000 mg total) by mouth 2 (two) times daily with a meal.   montelukast 10 MG tablet Commonly known as:  SINGULAIR TAKE 1 TABLET AT BEDTIME What changed:  See the  new instructions.   ondansetron 8 MG disintegrating tablet Commonly known as:  ZOFRAN ODT Take 1 tablet (8 mg total) by mouth every 8 (eight) hours as needed for nausea or vomiting.   pravastatin 40 MG tablet Commonly known as:  PRAVACHOL Take 1 tablet (40 mg total) by mouth daily. What changed:  when to take this   predniSONE 50 MG tablet Commonly known as:  DELTASONE Take 1 tablet (50 mg total) by mouth daily with breakfast.   verapamil 120 MG CR tablet Commonly known as:   CALAN-SR Take 1 tablet (120 mg total) by mouth at bedtime.   warfarin 5 MG tablet Commonly known as:  COUMADIN TAKE 1 TABLET AS DIRECTED What changed:  Another medication with the same name was changed. Make sure you understand how and when to take each.   warfarin 4 MG tablet Commonly known as:  COUMADIN Take as directed What changed:  how much to take  how to take this  when to take this  additional instructions      Follow-up Information    Victorya Hillman, MD Follow up in 2 week(s).   Specialty:  General Surgery Contact information: [address] [place] [address] [phone]           Signed: Rolm Bookbinder 06/05/2016, 7:32 AM

## 2016-06-05 NOTE — Progress Notes (Signed)
Pt discharged to home.  Discharge instructions explained to pt.  Pt has no questions at the time of discharge.  Pt states she has all belongings.  IV removed.  Pt taken off unit via wheelchair by volunteer services.   

## 2016-06-11 ENCOUNTER — Ambulatory Visit (INDEPENDENT_AMBULATORY_CARE_PROVIDER_SITE_OTHER): Payer: BLUE CROSS/BLUE SHIELD | Admitting: *Deleted

## 2016-06-11 DIAGNOSIS — I2782 Chronic pulmonary embolism: Secondary | ICD-10-CM

## 2016-06-11 LAB — POCT INR: INR: 1.5

## 2016-06-11 NOTE — Progress Notes (Signed)
Patient's BP today is 140/80

## 2016-06-18 ENCOUNTER — Telehealth: Payer: Self-pay | Admitting: Family Medicine

## 2016-06-18 NOTE — Telephone Encounter (Signed)
No answer. LMOVM. Called to follow up/confirm scheduled appointment. Reminded patient to arrive early and bring all medications.

## 2016-06-19 ENCOUNTER — Encounter: Payer: Self-pay | Admitting: Family Medicine

## 2016-06-19 ENCOUNTER — Ambulatory Visit (INDEPENDENT_AMBULATORY_CARE_PROVIDER_SITE_OTHER): Payer: BLUE CROSS/BLUE SHIELD | Admitting: *Deleted

## 2016-06-19 ENCOUNTER — Ambulatory Visit (INDEPENDENT_AMBULATORY_CARE_PROVIDER_SITE_OTHER): Payer: BLUE CROSS/BLUE SHIELD | Admitting: Family Medicine

## 2016-06-19 DIAGNOSIS — I2782 Chronic pulmonary embolism: Secondary | ICD-10-CM

## 2016-06-19 DIAGNOSIS — I10 Essential (primary) hypertension: Secondary | ICD-10-CM

## 2016-06-19 DIAGNOSIS — Z7901 Long term (current) use of anticoagulants: Secondary | ICD-10-CM | POA: Diagnosis not present

## 2016-06-19 LAB — POCT INR: INR: 1.6

## 2016-06-19 MED ORDER — LISINOPRIL 10 MG PO TABS: 10.0000 mg | ORAL_TABLET | Freq: Every day | ORAL | 3 refills | Status: DC

## 2016-06-19 NOTE — Assessment & Plan Note (Signed)
Patient is here to discuss her hypertension medications. She states that when she was admitted for a cholecystectomy she had some issues with hypotension. Since that time she has discontinued her CCB. Blood pressure today is still slightly low. - Discontinue her thiazide - Reduce lisinopril from 20 mg to 10 mg daily.

## 2016-06-19 NOTE — Patient Instructions (Signed)
It was a pleasure seeing you today in our clinic. Today we discussed your blood pressure and warfarin. Here is the treatment plan we have discussed and agreed upon together:   - I have discontinued your lisinopril/hydrochlorothiazide combination medication. I have placed a new order for lisinopril 10 mg. Take 1 tablet once a day. - I would like for you to consider the possibility of changing your warfarin/Coumadin medication. Or discontinuing it altogether. Considering you have had only one pulmonary embolism in the past, 5-6 years ago, current guidelines suggest that you may not need lifelong anticoagulation. Also, I would like to consider changing you to a different type of anticoagulation if you desire to continue this type of treatment.

## 2016-06-19 NOTE — Assessment & Plan Note (Signed)
Patient is currently on Coumadin. She has been on this for the past 5-6 years. She has had no significant complications from this medication. It was noted that patient has only had one PE in the past, and to the best of my knowledge she may not meet current guidelines for lifelong anticoagulant therapy. Most recent CTA showed little evidence of residual PE. - Discussed possibility of transitioning off of Coumadin. I expressed the option of complete discontinuation of anticoagulants versus initiation of therapeutic DOAC versus initiation of low-dose DOAC.   - At this time I do not believe it is wise to make significant changes in her regimen, but I believe it was important to discuss these so patient has thought about them and we can make a transition after she is done with her upcoming surgeries.

## 2016-06-19 NOTE — Progress Notes (Signed)
   HPI  CC: Blood pressure and anticoagulation Patient is here follow-up on her blood pressure. She states that she is been asymptomatic but had some issues with hypotension after her recent cholecystectomy. Since that time she has discontinued use of her verapamil. She was asked to cut her previous lisinopril hydrochlorothiazide tablets in half. She states that she was unable to do this because they would "just crumble". She denies any symptoms of hypotension. Denies any presyncopal symptoms. No lightheadedness, headache, diaphoresis, confusion, vision changes, nausea, weakness, numbness, or paresthesias.  Anticoagulation: Patient has been on Coumadin for the past 5-6 years after suffering an extensive bilateral PE. She has had no issues with this in the past. She follows up regularly and takes her Coumadin consistently. We discussed at length the possibility that she may not require this medication lifelong. Current guidelines suggest that a single PE does not always qualify lifelong anticoagulation. Patient stated her understanding. She states that she is okay continuing anticoagulation but also seemed interested in the possibility that she may not need this long-term. She states that she feels fine right now. No issues with bleeding, bruising, hematuria, hematochezia, or melena.  Objective: BP 108/64   Pulse 100   Temp 98.7 F (37.1 C) (Oral)   Ht 5\' 6"  (1.676 m)   Wt 245 lb 6.4 oz (111.3 kg)   SpO2 99%   BMI 39.61 kg/m  Gen: NAD, alert, cooperative, and pleasant. CV: RRR, no murmur Resp: CTAB, no wheezes, non-labored Abd: SNTND, BS present, no guarding or organomegaly Ext: No edema, warm Neuro: Alert and oriented, Speech clear, No gross deficits   Assessment and plan:  HYPERTENSION, BENIGN SYSTEMIC Patient is here to discuss her hypertension medications. She states that when she was admitted for a cholecystectomy she had some issues with hypotension. Since that time she has  discontinued her CCB. Blood pressure today is still slightly low. - Discontinue her thiazide - Reduce lisinopril from 20 mg to 10 mg daily.  Long-term (current) use of anticoagulants Patient is currently on Coumadin. She has been on this for the past 5-6 years. She has had no significant complications from this medication. It was noted that patient has only had one PE in the past, and to the best of my knowledge she may not meet current guidelines for lifelong anticoagulant therapy. Most recent CTA showed little evidence of residual PE. - Discussed possibility of transitioning off of Coumadin. I expressed the option of complete discontinuation of anticoagulants versus initiation of therapeutic DOAC versus initiation of low-dose DOAC.   - At this time I do not believe it is wise to make significant changes in her regimen, but I believe it was important to discuss these so patient has thought about them and we can make a transition after she is done with her upcoming surgeries.   Meds ordered this encounter  Medications  . lisinopril (PRINIVIL,ZESTRIL) 10 MG tablet    Sig: Take 1 tablet (10 mg total) by mouth daily.    Dispense:  90 tablet    Refill:  3     Elberta Leatherwood, MD,MS,  PGY3 06/19/2016 12:33 PM

## 2016-06-26 ENCOUNTER — Ambulatory Visit (INDEPENDENT_AMBULATORY_CARE_PROVIDER_SITE_OTHER): Payer: BLUE CROSS/BLUE SHIELD | Admitting: *Deleted

## 2016-06-26 DIAGNOSIS — Z7901 Long term (current) use of anticoagulants: Secondary | ICD-10-CM | POA: Diagnosis not present

## 2016-06-26 DIAGNOSIS — I2782 Chronic pulmonary embolism: Secondary | ICD-10-CM

## 2016-06-26 LAB — POCT INR: INR: 1.9

## 2016-07-06 ENCOUNTER — Ambulatory Visit (INDEPENDENT_AMBULATORY_CARE_PROVIDER_SITE_OTHER): Payer: BLUE CROSS/BLUE SHIELD | Admitting: *Deleted

## 2016-07-06 DIAGNOSIS — I2782 Chronic pulmonary embolism: Secondary | ICD-10-CM

## 2016-07-06 DIAGNOSIS — Z7901 Long term (current) use of anticoagulants: Secondary | ICD-10-CM | POA: Diagnosis not present

## 2016-07-06 LAB — POCT INR: INR: 2.3

## 2016-07-20 ENCOUNTER — Ambulatory Visit (INDEPENDENT_AMBULATORY_CARE_PROVIDER_SITE_OTHER): Payer: BLUE CROSS/BLUE SHIELD | Admitting: *Deleted

## 2016-07-20 ENCOUNTER — Other Ambulatory Visit: Payer: Self-pay | Admitting: *Deleted

## 2016-07-20 DIAGNOSIS — I2782 Chronic pulmonary embolism: Secondary | ICD-10-CM

## 2016-07-20 DIAGNOSIS — I824Y9 Acute embolism and thrombosis of unspecified deep veins of unspecified proximal lower extremity: Secondary | ICD-10-CM

## 2016-07-20 LAB — POCT INR: INR: 2.8

## 2016-07-20 MED ORDER — MONTELUKAST SODIUM 10 MG PO TABS
10.0000 mg | ORAL_TABLET | Freq: Every day | ORAL | 3 refills | Status: DC
Start: 2016-07-20 — End: 2017-03-18

## 2016-07-20 MED ORDER — WARFARIN SODIUM 4 MG PO TABS: ORAL_TABLET | ORAL | 3 refills | Status: DC

## 2016-08-17 ENCOUNTER — Ambulatory Visit (INDEPENDENT_AMBULATORY_CARE_PROVIDER_SITE_OTHER): Payer: BLUE CROSS/BLUE SHIELD | Admitting: *Deleted

## 2016-08-17 ENCOUNTER — Other Ambulatory Visit: Payer: Self-pay | Admitting: Urology

## 2016-08-17 VITALS — BP 130/88

## 2016-08-17 DIAGNOSIS — I10 Essential (primary) hypertension: Secondary | ICD-10-CM

## 2016-08-17 DIAGNOSIS — I2782 Chronic pulmonary embolism: Secondary | ICD-10-CM | POA: Diagnosis not present

## 2016-08-17 DIAGNOSIS — N200 Calculus of kidney: Secondary | ICD-10-CM

## 2016-08-17 LAB — POCT INR: INR: 1.9

## 2016-08-17 NOTE — Progress Notes (Signed)
Patient in today for blood pressure check. BP taken manually in left arm was 130/88. Patient reports regular taking of blood pressure medications. Patient states that since MD took her off thiazide she has had increased swelling in her hands and feet. Informed patient that I would sent message to PCP and she would hear back form [place] if MD decides to make any changes.

## 2016-09-14 ENCOUNTER — Ambulatory Visit (INDEPENDENT_AMBULATORY_CARE_PROVIDER_SITE_OTHER): Payer: BLUE CROSS/BLUE SHIELD | Admitting: *Deleted

## 2016-09-14 DIAGNOSIS — I2782 Chronic pulmonary embolism: Secondary | ICD-10-CM

## 2016-09-14 LAB — POCT INR: INR: 2.8

## 2016-09-28 ENCOUNTER — Other Ambulatory Visit: Payer: Self-pay | Admitting: Family Medicine

## 2016-09-28 DIAGNOSIS — E119 Type 2 diabetes mellitus without complications: Secondary | ICD-10-CM

## 2016-09-30 ENCOUNTER — Other Ambulatory Visit: Payer: Self-pay | Admitting: Family Medicine

## 2016-10-12 ENCOUNTER — Ambulatory Visit (INDEPENDENT_AMBULATORY_CARE_PROVIDER_SITE_OTHER): Payer: BLUE CROSS/BLUE SHIELD | Admitting: *Deleted

## 2016-10-12 DIAGNOSIS — I2782 Chronic pulmonary embolism: Secondary | ICD-10-CM | POA: Diagnosis not present

## 2016-10-12 LAB — POCT INR: INR: 3.1

## 2016-10-12 NOTE — Anesthesia Postprocedure Evaluation (Signed)
Anesthesia Post Note  Patient: Allison [name]  Procedure(s) Performed: Procedure(s) (LRB): CYSTOSCOPY WITH RETROGRADE PYELOGRAM, URETEROSCOPY AND STENT PLACEMENT (Right)     Anesthesia Post Evaluation  Last Vitals:  Vitals:   04/17/16 1257 04/17/16 1331  BP: 124/77 (P) 114/63  Pulse:    Resp: 12 (P) 16  Temp: 37.1 C     Last Pain:  Vitals:   04/18/16 0925  TempSrc:   PainSc: [place]

## 2016-10-12 NOTE — Addendum Note (Signed)
Addendum  created 10/12/16 1056 by Lyndle Herrlich, MD   Sign clinical note

## 2016-11-14 ENCOUNTER — Other Ambulatory Visit (HOSPITAL_COMMUNITY): Payer: BLUE CROSS/BLUE SHIELD

## 2016-11-14 ENCOUNTER — Ambulatory Visit (HOSPITAL_COMMUNITY): Payer: BLUE CROSS/BLUE SHIELD

## 2016-11-15 NOTE — Progress Notes (Signed)
[facility] fam med  Dr Alease Frame 06-19-16 epic   "Most recent CTA showed little evidence of residual PE."  CTA 02-13-16  Epic   Stress test 03-22-16 epic  "Myocardial perfusion is normal. The study is normal. This is a low risk study. Overall left ventricular systolic function was normal. Nuclear stress EF: 70%."  ECHO 03-09-17 epic  "Aortic valve:   Mildly thickened leaflets. Cusp separation wasnormal.  Doppler:  Transvalvular velocity was within the normal range. There was no stenosis. "  EKG 02-20-16 epic

## 2016-11-15 NOTE — Patient Instructions (Signed)
[facility]  11/15/2016   Your procedure is scheduled on: 11-20-16  Report to [facility] Main  Entrance . Go to the Radiology department first to check in at 7:30AM . Then take [facility]  elevators to 3rd floor to  [place] at [facility].   Call this number if you have problems the morning of surgery [phone]    Remember: ONLY 1 PERSON MAY GO WITH YOU TO SHORT STAY TO GET  READY MORNING OF [place].  Do not eat food or drink liquids :After Midnight.     Take these medicines the morning of surgery with A SIP OF WATER:levothyroxine(synthroid), tylenol as needed, xanax as needed, zyrtec as needed,inhaler as needed (nay bring to hospital)   DO NOT [facility] may not have any metal on your body including hair pins and              piercings  Do not wear jewelry, make-up, lotions, powders or perfumes, deodorant             Do not wear nail polish.  Do not shave  48 hours prior to surgery.                 Do not bring valuables to the hospital. [place].  Contacts, dentures or bridgework may not be worn into surgery.  Leave suitcase in the car. After surgery it may be brought to your room.               Please read over the following fact sheets you were given: _____________________________________________________________________             How to Manage Your Diabetes Before and After Surgery  Why is it important to control my blood sugar before and after surgery? . Improving blood sugar levels before and after surgery helps healing and can limit problems. . A way of improving blood sugar control is eating a healthy diet by: o  Eating less sugar and carbohydrates o  Increasing activity/exercise o  Talking with your doctor about reaching your blood sugar goals . High blood sugars (greater than 180 mg/dL) can raise your risk of  infections and slow your recovery, so you will need to focus on controlling your diabetes during the weeks before surgery. . Make sure that the doctor who takes care of your diabetes knows about your planned surgery including the date and location.  How do I manage my blood sugar before surgery? . Check your blood sugar at least 4 times a day, starting 2 days before surgery, to make sure that the level is not too high or low. o Check your blood sugar the morning of your surgery when you wake up and every 2 hours until you get to the Short Stay unit. . If your blood sugar is less than 70 mg/dL, you will need to treat for low blood sugar: o Do not take insulin. o Treat a low blood sugar (less than 70 mg/dL) with  cup of clear juice (cranberry or apple), 4 glucose tablets, OR glucose gel. o  Recheck blood sugar in 15 minutes after treatment (to make sure it is greater than 70 mg/dL). If your blood sugar is not greater than 70 mg/dL on recheck, call [phone] for further instructions. . Report your blood sugar to the short stay nurse when you get to Short Stay.  . If you are admitted to the hospital after surgery: o Your blood sugar will be checked by the staff and you will probably be given insulin after surgery (instead of oral diabetes medicines) to make sure you have good blood sugar levels. o The goal for blood sugar control after surgery is 80-180 mg/dL.   WHAT DO I DO ABOUT MY DIABETES MEDICATION?   . THE DAY BEFORE SURGERY,   Take METFORMIN as usual.  Only take morning and/or afternoon dose of GLYBURIDE(DIABETA). Do NOT take evening dose.      . THE MORNING OF SURGERY, Do not take oral diabetes medicines (pills) the morning of surgery.   Patient Signature:  Date:   Nurse Signature:  Date:   Reviewed and Endorsed by [facility] Patient Education Committee, August 2015  [facility] - Preparing for Surgery Before surgery, you can play an important role.  Because skin is not  sterile, your skin needs to be as free of germs as possible.  You can reduce the number of germs on your skin by washing with CHG (chlorahexidine gluconate) soap before surgery.  CHG is an antiseptic cleaner which kills germs and bonds with the skin to continue killing germs even after washing. Please DO NOT use if you have an allergy to CHG or antibacterial soaps.  If your skin becomes reddened/irritated stop using the CHG and inform your nurse when you arrive at Short Stay. Do not shave (including legs and underarms) for at least 48 hours prior to the first CHG shower.  You may shave your face/neck. Please follow these instructions carefully:  1.  Shower with CHG Soap the night before surgery and the  morning of Surgery.  2.  If you choose to wash your hair, wash your hair first as usual with your  normal  shampoo.  3.  After you shampoo, rinse your hair and body thoroughly to remove the  shampoo.                           4.  Use CHG as you would any other liquid soap.  You can apply chg directly  to the skin and wash                       Gently with a scrungie or clean washcloth.  5.  Apply the CHG Soap to your body ONLY FROM THE NECK DOWN.   Do not use on face/ open                           Wound or open sores. Avoid contact with eyes, ears mouth and genitals (private parts).                       Wash face,  Genitals (private parts) with your normal soap.             6.  Wash thoroughly, paying special attention to the area where your surgery  will be performed.  7.  Thoroughly rinse your body with warm water from the neck down.  8.  DO NOT shower/wash with your normal soap after using and rinsing off  the CHG Soap.                9.  Pat yourself dry with a clean towel.            10.  Wear clean pajamas.            11.  Place clean sheets on your bed the night of your first shower and do not  sleep with pets. Day of Surgery : Do not apply any lotions/deodorants the morning of surgery.   Please wear clean clothes to the hospital/surgery center.  FAILURE TO FOLLOW THESE INSTRUCTIONS MAY RESULT IN THE CANCELLATION OF YOUR SURGERY PATIENT SIGNATURE_________________________________  NURSE SIGNATURE__________________________________  ________________________________________________________________________

## 2016-11-15 NOTE — Progress Notes (Signed)
Orders in epic will expire before date of surgery. Please have new orders placed in epic. PST appt is 11-16-16 at 0800 Thank you!

## 2016-11-16 ENCOUNTER — Encounter (HOSPITAL_COMMUNITY)
Admission: RE | Admit: 2016-11-16 | Discharge: 2016-11-16 | Disposition: A | Discharge status: Home / Self Care | Payer: BLUE CROSS/BLUE SHIELD | Source: Ambulatory Visit | Attending: Urology | Admitting: Urology

## 2016-11-16 ENCOUNTER — Encounter (HOSPITAL_COMMUNITY): Payer: Self-pay

## 2016-11-16 DIAGNOSIS — Z01812 Encounter for preprocedural laboratory examination: Secondary | ICD-10-CM | POA: Insufficient documentation

## 2016-11-16 LAB — BASIC METABOLIC PANEL
Anion gap: 12 (ref 5–15)
BUN: 28 mg/dL — ABNORMAL HIGH (ref 6–20)
CALCIUM: 8.6 mg/dL — AB (ref 8.9–10.3)
CO2: 26 mmol/L (ref 22–32)
CREATININE: 1.73 mg/dL — AB (ref 0.44–1.00)
Chloride: 104 mmol/L (ref 101–111)
GFR calc non Af Amer: 31 mL/min — ABNORMAL LOW (ref >60)
GFR, EST AFRICAN AMERICAN: 36 mL/min — AB (ref >60)
Glucose, Bld: 92 mg/dL (ref 65–99)
Potassium: 4.5 mmol/L (ref 3.5–5.1)
Sodium: 142 mmol/L (ref 135–145)

## 2016-11-16 LAB — GLUCOSE, CAPILLARY: Glucose-Capillary: 93 mg/dL (ref 65–99)

## 2016-11-16 LAB — CBC
HCT: 37.7 % (ref 36.0–46.0)
Hemoglobin: 12.1 g/dL (ref 12.0–15.0)
MCH: 28.7 pg (ref 26.0–34.0)
MCHC: 32.1 g/dL (ref 30.0–36.0)
MCV: 89.5 fL (ref 78.0–100.0)
PLATELETS: 206 10*3/uL (ref 150–400)
RBC: 4.21 MIL/uL (ref 3.87–5.11)
RDW: 15.4 % (ref 11.5–15.5)
WBC: 6.7 10*3/uL (ref 4.0–10.5)

## 2016-11-16 LAB — HEMOGLOBIN A1C
HEMOGLOBIN A1C: 6.9 % — AB (ref 4.8–5.6)
Mean Plasma Glucose: 151.33 mg/dL

## 2016-11-16 NOTE — Progress Notes (Signed)
   11/16/16 0925  OBSTRUCTIVE SLEEP APNEA  Have you ever been diagnosed with sleep apnea through a sleep study? No  Do you snore loudly (loud enough to be heard through closed doors)?  1  Do you often feel tired, fatigued, or sleepy during the daytime (such as falling asleep during driving or talking to someone)? 1  Has anyone observed you stop breathing during your sleep? 0  Do you have, or are you being treated for high blood pressure? 1  BMI more than 35 kg/m2? 1  Age > 50 (1-yes) 1  Neck circumference greater than:Female 16 inches or larger, Female 17inches or larger? 0  Female Gender (Yes=1) 0  Obstructive Sleep Apnea Score 5

## 2016-11-16 NOTE — Progress Notes (Signed)
BMP routed via epic to Dr Pilar Jarvis

## 2016-11-19 ENCOUNTER — Other Ambulatory Visit: Payer: Self-pay | Admitting: Student

## 2016-11-19 ENCOUNTER — Other Ambulatory Visit: Payer: Self-pay | Admitting: Radiology

## 2016-11-20 ENCOUNTER — Inpatient Hospital Stay (HOSPITAL_COMMUNITY): Payer: BLUE CROSS/BLUE SHIELD | Admitting: Anesthesiology

## 2016-11-20 ENCOUNTER — Inpatient Hospital Stay (HOSPITAL_COMMUNITY): Payer: BLUE CROSS/BLUE SHIELD

## 2016-11-20 ENCOUNTER — Ambulatory Visit (HOSPITAL_COMMUNITY)
Admission: RE | Admit: 2016-11-20 | Discharge: 2016-11-20 | Disposition: A | Discharge status: Home / Self Care | Payer: BLUE CROSS/BLUE SHIELD | Source: Ambulatory Visit | Attending: Urology | Admitting: Urology

## 2016-11-20 ENCOUNTER — Encounter (HOSPITAL_COMMUNITY)
Admission: RE | Disposition: A | Discharge status: Home / Self Care | Payer: Self-pay | Source: Ambulatory Visit | Attending: Urology

## 2016-11-20 ENCOUNTER — Ambulatory Visit (HOSPITAL_COMMUNITY)
Admission: RE | Admit: 2016-11-20 | Discharge: 2016-11-21 | Disposition: A | Discharge status: Home / Self Care | Payer: BLUE CROSS/BLUE SHIELD | Source: Ambulatory Visit | Attending: Urology | Admitting: Urology

## 2016-11-20 ENCOUNTER — Other Ambulatory Visit: Payer: Self-pay | Admitting: Urology

## 2016-11-20 ENCOUNTER — Encounter (HOSPITAL_COMMUNITY): Payer: Self-pay | Admitting: General Practice

## 2016-11-20 DIAGNOSIS — N200 Calculus of kidney: Principal | ICD-10-CM | POA: Insufficient documentation

## 2016-11-20 DIAGNOSIS — Z6839 Body mass index (BMI) 39.0-39.9, adult: Secondary | ICD-10-CM | POA: Insufficient documentation

## 2016-11-20 DIAGNOSIS — Z9049 Acquired absence of other specified parts of digestive tract: Secondary | ICD-10-CM | POA: Insufficient documentation

## 2016-11-20 DIAGNOSIS — K219 Gastro-esophageal reflux disease without esophagitis: Secondary | ICD-10-CM | POA: Diagnosis not present

## 2016-11-20 DIAGNOSIS — Z90710 Acquired absence of both cervix and uterus: Secondary | ICD-10-CM | POA: Insufficient documentation

## 2016-11-20 DIAGNOSIS — Z8542 Personal history of malignant neoplasm of other parts of uterus: Secondary | ICD-10-CM | POA: Diagnosis not present

## 2016-11-20 DIAGNOSIS — Z79899 Other long term (current) drug therapy: Secondary | ICD-10-CM | POA: Insufficient documentation

## 2016-11-20 DIAGNOSIS — Z87442 Personal history of urinary calculi: Secondary | ICD-10-CM | POA: Insufficient documentation

## 2016-11-20 DIAGNOSIS — Z7984 Long term (current) use of oral hypoglycemic drugs: Secondary | ICD-10-CM | POA: Diagnosis not present

## 2016-11-20 DIAGNOSIS — J449 Chronic obstructive pulmonary disease, unspecified: Secondary | ICD-10-CM | POA: Insufficient documentation

## 2016-11-20 DIAGNOSIS — E785 Hyperlipidemia, unspecified: Secondary | ICD-10-CM | POA: Insufficient documentation

## 2016-11-20 DIAGNOSIS — M109 Gout, unspecified: Secondary | ICD-10-CM | POA: Diagnosis not present

## 2016-11-20 DIAGNOSIS — I10 Essential (primary) hypertension: Secondary | ICD-10-CM | POA: Insufficient documentation

## 2016-11-20 DIAGNOSIS — I824Y9 Acute embolism and thrombosis of unspecified deep veins of unspecified proximal lower extremity: Secondary | ICD-10-CM

## 2016-11-20 DIAGNOSIS — Z7901 Long term (current) use of anticoagulants: Secondary | ICD-10-CM | POA: Insufficient documentation

## 2016-11-20 DIAGNOSIS — E039 Hypothyroidism, unspecified: Secondary | ICD-10-CM | POA: Diagnosis not present

## 2016-11-20 DIAGNOSIS — E119 Type 2 diabetes mellitus without complications: Secondary | ICD-10-CM | POA: Diagnosis not present

## 2016-11-20 DIAGNOSIS — F419 Anxiety disorder, unspecified: Secondary | ICD-10-CM | POA: Diagnosis not present

## 2016-11-20 DIAGNOSIS — E669 Obesity, unspecified: Secondary | ICD-10-CM | POA: Diagnosis not present

## 2016-11-20 DIAGNOSIS — Z86711 Personal history of pulmonary embolism: Secondary | ICD-10-CM | POA: Insufficient documentation

## 2016-11-20 HISTORY — PX: NEPHROLITHOTOMY: SHX5134

## 2016-11-20 HISTORY — PX: IR URETERAL STENT RIGHT NEW ACCESS W/O SEP NEPHROSTOMY CATH: IMG6076

## 2016-11-20 LAB — CBC
HCT: 35.9 % — ABNORMAL LOW (ref 36.0–46.0)
Hemoglobin: 11.7 g/dL — ABNORMAL LOW (ref 12.0–15.0)
MCH: 29.1 pg (ref 26.0–34.0)
MCHC: 32.6 g/dL (ref 30.0–36.0)
MCV: 89.3 fL (ref 78.0–100.0)
Platelets: 199 10*3/uL (ref 150–400)
RBC: 4.02 MIL/uL (ref 3.87–5.11)
RDW: 15.6 % — ABNORMAL HIGH (ref 11.5–15.5)
WBC: 6.1 10*3/uL (ref 4.0–10.5)

## 2016-11-20 LAB — BASIC METABOLIC PANEL WITH GFR
Anion gap: 11 (ref 5–15)
BUN: 30 mg/dL — ABNORMAL HIGH (ref 6–20)
CO2: 24 mmol/L (ref 22–32)
Calcium: 7.7 mg/dL — ABNORMAL LOW (ref 8.9–10.3)
Chloride: 105 mmol/L (ref 101–111)
Creatinine, Ser: 1.67 mg/dL — ABNORMAL HIGH (ref 0.44–1.00)
GFR calc Af Amer: 37 mL/min — ABNORMAL LOW
GFR calc non Af Amer: 32 mL/min — ABNORMAL LOW
Glucose, Bld: 125 mg/dL — ABNORMAL HIGH (ref 65–99)
Potassium: 4.1 mmol/L (ref 3.5–5.1)
Sodium: 140 mmol/L (ref 135–145)

## 2016-11-20 LAB — GLUCOSE, CAPILLARY
GLUCOSE-CAPILLARY: 241 mg/dL — AB (ref 65–99)
Glucose-Capillary: 126 mg/dL — ABNORMAL HIGH (ref 65–99)
Glucose-Capillary: 147 mg/dL — ABNORMAL HIGH (ref 65–99)

## 2016-11-20 LAB — PROTIME-INR
INR: 0.96
PROTHROMBIN TIME: 12.7 s (ref 11.4–15.2)

## 2016-11-20 LAB — APTT: aPTT: 25 seconds (ref 24–36)

## 2016-11-20 SURGERY — NEPHROLITHOTOMY PERCUTANEOUS
Anesthesia: General | Laterality: Right

## 2016-11-20 MED ORDER — ROCURONIUM BROMIDE 50 MG/5ML IV SOSY
PREFILLED_SYRINGE | INTRAVENOUS | Status: AC
  Filled 2016-11-20: qty 5

## 2016-11-20 MED ORDER — PROPOFOL 10 MG/ML IV BOLUS
INTRAVENOUS | Status: DC | PRN
  Administered 2016-11-20: 180 mg via INTRAVENOUS

## 2016-11-20 MED ORDER — ONDANSETRON HCL 4 MG/2ML IJ SOLN
4.0000 mg | Freq: Once | INTRAMUSCULAR | Status: AC | PRN
  Administered 2016-11-20: 4 mg via INTRAVENOUS

## 2016-11-20 MED ORDER — ROCURONIUM BROMIDE 10 MG/ML (PF) SYRINGE
PREFILLED_SYRINGE | INTRAVENOUS | Status: DC | PRN
  Administered 2016-11-20: 50 mg via INTRAVENOUS

## 2016-11-20 MED ORDER — FENTANYL CITRATE (PF) 250 MCG/5ML IJ SOLN
INTRAMUSCULAR | Status: AC
  Filled 2016-11-20: qty 5

## 2016-11-20 MED ORDER — SODIUM CHLORIDE 0.9 % IV SOLN
INTRAVENOUS | Status: DC
  Administered 2016-11-20 (×2): via INTRAVENOUS

## 2016-11-20 MED ORDER — ONDANSETRON HCL 4 MG/2ML IJ SOLN: 4.0000 mg | INTRAMUSCULAR | Status: DC | PRN

## 2016-11-20 MED ORDER — CIPROFLOXACIN IN D5W 400 MG/200ML IV SOLN
400.0000 mg | INTRAVENOUS | Status: AC
  Administered 2016-11-20: 400 mg via INTRAVENOUS

## 2016-11-20 MED ORDER — IOPAMIDOL (ISOVUE-300) INJECTION 61%
50.0000 mL | Freq: Once | INTRAVENOUS | Status: AC | PRN
  Administered 2016-11-20: 15 mL

## 2016-11-20 MED ORDER — CEFAZOLIN SODIUM-DEXTROSE 2-4 GM/100ML-% IV SOLN
2.0000 g | Freq: Three times a day (TID) | INTRAVENOUS | Status: DC
  Administered 2016-11-20 – 2016-11-21 (×2): 2 g via INTRAVENOUS
  Filled 2016-11-20 (×4): qty 100

## 2016-11-20 MED ORDER — MIDAZOLAM HCL 2 MG/2ML IJ SOLN
INTRAMUSCULAR | Status: AC | PRN
  Administered 2016-11-20 (×4): 1 mg via INTRAVENOUS

## 2016-11-20 MED ORDER — PHENYLEPHRINE 40 MCG/ML (10ML) SYRINGE FOR IV PUSH (FOR BLOOD PRESSURE SUPPORT)
PREFILLED_SYRINGE | INTRAVENOUS | Status: AC
  Filled 2016-11-20: qty 10

## 2016-11-20 MED ORDER — CIPROFLOXACIN IN D5W 400 MG/200ML IV SOLN
INTRAVENOUS | Status: AC
  Filled 2016-11-20: qty 200

## 2016-11-20 MED ORDER — ALPRAZOLAM 0.5 MG PO TABS
0.5000 mg | ORAL_TABLET | Freq: Three times a day (TID) | ORAL | Status: DC | PRN
  Administered 2016-11-20: 0.5 mg via ORAL
  Filled 2016-11-20: qty 1

## 2016-11-20 MED ORDER — PHENYLEPHRINE 40 MCG/ML (10ML) SYRINGE FOR IV PUSH (FOR BLOOD PRESSURE SUPPORT)
PREFILLED_SYRINGE | INTRAVENOUS | Status: DC | PRN
  Administered 2016-11-20 (×4): 80 ug via INTRAVENOUS

## 2016-11-20 MED ORDER — FENTANYL CITRATE (PF) 100 MCG/2ML IJ SOLN
INTRAMUSCULAR | Status: AC
  Filled 2016-11-20: qty 4

## 2016-11-20 MED ORDER — LIDOCAINE 2% (20 MG/ML) 5 ML SYRINGE
INTRAMUSCULAR | Status: DC | PRN
  Administered 2016-11-20: 100 mg via INTRAVENOUS

## 2016-11-20 MED ORDER — PRAVASTATIN SODIUM 40 MG PO TABS
40.0000 mg | ORAL_TABLET | Freq: Every day | ORAL | Status: DC
  Administered 2016-11-20: 40 mg via ORAL
  Filled 2016-11-20: qty 1

## 2016-11-20 MED ORDER — MIDAZOLAM HCL 5 MG/5ML IJ SOLN
INTRAMUSCULAR | Status: DC | PRN
  Administered 2016-11-20: 2 mg via INTRAVENOUS

## 2016-11-20 MED ORDER — DEXAMETHASONE SODIUM PHOSPHATE 10 MG/ML IJ SOLN
INTRAMUSCULAR | Status: DC | PRN
  Administered 2016-11-20: 10 mg via INTRAVENOUS

## 2016-11-20 MED ORDER — CEFAZOLIN SODIUM-DEXTROSE 2-4 GM/100ML-% IV SOLN
INTRAVENOUS | Status: AC
  Filled 2016-11-20: qty 100

## 2016-11-20 MED ORDER — ONDANSETRON HCL 4 MG/2ML IJ SOLN
INTRAMUSCULAR | Status: AC
  Filled 2016-11-20: qty 2

## 2016-11-20 MED ORDER — SODIUM CHLORIDE 0.9 % IR SOLN
Status: DC | PRN
  Administered 2016-11-20: 18000 mL

## 2016-11-20 MED ORDER — HYDROCHLOROTHIAZIDE 12.5 MG PO CAPS
12.5000 mg | ORAL_CAPSULE | Freq: Every day | ORAL | Status: DC
  Administered 2016-11-21: 12.5 mg via ORAL
  Filled 2016-11-20: qty 1

## 2016-11-20 MED ORDER — SUGAMMADEX SODIUM 200 MG/2ML IV SOLN
INTRAVENOUS | Status: DC | PRN
  Administered 2016-11-20: 200 mg via INTRAVENOUS

## 2016-11-20 MED ORDER — SUGAMMADEX SODIUM 200 MG/2ML IV SOLN
INTRAVENOUS | Status: AC
  Filled 2016-11-20: qty 2

## 2016-11-20 MED ORDER — ALBUTEROL SULFATE (2.5 MG/3ML) 0.083% IN NEBU: 2.5000 mg | INHALATION_SOLUTION | Freq: Four times a day (QID) | RESPIRATORY_TRACT | Status: DC | PRN

## 2016-11-20 MED ORDER — HYDROMORPHONE HCL-NACL 0.5-0.9 MG/ML-% IV SOSY: 0.5000 mg | PREFILLED_SYRINGE | INTRAVENOUS | Status: DC | PRN

## 2016-11-20 MED ORDER — FENTANYL CITRATE (PF) 100 MCG/2ML IJ SOLN
25.0000 ug | INTRAMUSCULAR | Status: DC | PRN
Start: 2016-11-20 — End: 2016-11-20
  Administered 2016-11-20 (×3): 25 ug via INTRAVENOUS

## 2016-11-20 MED ORDER — FENTANYL CITRATE (PF) 100 MCG/2ML IJ SOLN
INTRAMUSCULAR | Status: DC | PRN
  Administered 2016-11-20 (×3): 50 ug via INTRAVENOUS
  Administered 2016-11-20: 100 ug via INTRAVENOUS

## 2016-11-20 MED ORDER — SODIUM CHLORIDE 0.9 % IV SOLN
INTRAVENOUS | Status: DC
  Administered 2016-11-20: via INTRAVENOUS

## 2016-11-20 MED ORDER — MIDAZOLAM HCL 2 MG/2ML IJ SOLN
INTRAMUSCULAR | Status: AC
  Filled 2016-11-20: qty 4

## 2016-11-20 MED ORDER — LEVOTHYROXINE SODIUM 25 MCG PO TABS
125.0000 ug | ORAL_TABLET | Freq: Every day | ORAL | Status: DC
  Administered 2016-11-21: 125 ug via ORAL
  Filled 2016-11-20: qty 1

## 2016-11-20 MED ORDER — FENTANYL CITRATE (PF) 100 MCG/2ML IJ SOLN
INTRAMUSCULAR | Status: AC | PRN
  Administered 2016-11-20 (×4): 50 ug via INTRAVENOUS

## 2016-11-20 MED ORDER — ONDANSETRON HCL 4 MG/2ML IJ SOLN
INTRAMUSCULAR | Status: AC
  Administered 2016-11-20: 4 mg via INTRAVENOUS
  Filled 2016-11-20: qty 2

## 2016-11-20 MED ORDER — INSULIN ASPART 100 UNIT/ML ~~LOC~~ SOLN
0.0000 [IU] | Freq: Every day | SUBCUTANEOUS | Status: DC
  Administered 2016-11-20: 2 [IU] via SUBCUTANEOUS

## 2016-11-20 MED ORDER — LORATADINE 10 MG PO TABS: 10.0000 mg | ORAL_TABLET | Freq: Every day | ORAL | Status: DC

## 2016-11-20 MED ORDER — LIDOCAINE 2% (20 MG/ML) 5 ML SYRINGE
INTRAMUSCULAR | Status: AC
  Filled 2016-11-20: qty 5

## 2016-11-20 MED ORDER — FENTANYL CITRATE (PF) 100 MCG/2ML IJ SOLN
INTRAMUSCULAR | Status: AC
  Administered 2016-11-20: 25 ug via INTRAVENOUS
  Filled 2016-11-20: qty 4

## 2016-11-20 MED ORDER — BELLADONNA ALKALOIDS-OPIUM 16.2-60 MG RE SUPP: 1.0000 | Freq: Four times a day (QID) | RECTAL | Status: DC | PRN

## 2016-11-20 MED ORDER — OXYCODONE HCL 5 MG PO TABS: 5.0000 mg | ORAL_TABLET | ORAL | Status: DC | PRN

## 2016-11-20 MED ORDER — SENNOSIDES-DOCUSATE SODIUM 8.6-50 MG PO TABS
2.0000 | ORAL_TABLET | Freq: Every day | ORAL | Status: DC
  Administered 2016-11-20: 2 via ORAL
  Filled 2016-11-20: qty 2

## 2016-11-20 MED ORDER — LISINOPRIL 20 MG PO TABS
20.0000 mg | ORAL_TABLET | Freq: Every day | ORAL | Status: DC
  Administered 2016-11-21: 20 mg via ORAL
  Filled 2016-11-20: qty 1

## 2016-11-20 MED ORDER — PROPOFOL 10 MG/ML IV BOLUS
INTRAVENOUS | Status: AC
  Filled 2016-11-20: qty 20

## 2016-11-20 MED ORDER — MIDAZOLAM HCL 2 MG/2ML IJ SOLN
INTRAMUSCULAR | Status: AC
  Filled 2016-11-20: qty 2

## 2016-11-20 MED ORDER — CEFAZOLIN SODIUM-DEXTROSE 2-4 GM/100ML-% IV SOLN
2.0000 g | INTRAVENOUS | Status: AC
  Administered 2016-11-20: 2 g via INTRAVENOUS

## 2016-11-20 MED ORDER — ACETAMINOPHEN 500 MG PO TABS
1000.0000 mg | ORAL_TABLET | Freq: Four times a day (QID) | ORAL | Status: AC
  Administered 2016-11-20 – 2016-11-21 (×4): 1000 mg via ORAL
  Filled 2016-11-20 (×4): qty 2

## 2016-11-20 MED ORDER — LISINOPRIL-HYDROCHLOROTHIAZIDE 20-12.5 MG PO TABS
1.0000 | ORAL_TABLET | Freq: Every day | ORAL | Status: DC
Start: 2016-11-21 — End: 2016-11-20

## 2016-11-20 MED ORDER — ONDANSETRON HCL 4 MG/2ML IJ SOLN
INTRAMUSCULAR | Status: DC | PRN
  Administered 2016-11-20: 4 mg via INTRAVENOUS

## 2016-11-20 MED ORDER — IOPAMIDOL (ISOVUE-300) INJECTION 61%
INTRAVENOUS | Status: AC
  Administered 2016-11-20: 15 mL
  Filled 2016-11-20: qty 50

## 2016-11-20 MED ORDER — INSULIN ASPART 100 UNIT/ML ~~LOC~~ SOLN
0.0000 [IU] | Freq: Three times a day (TID) | SUBCUTANEOUS | Status: DC
  Administered 2016-11-20: 7 [IU] via SUBCUTANEOUS
  Administered 2016-11-21: 4 [IU] via SUBCUTANEOUS

## 2016-11-20 SURGICAL SUPPLY — 44 items
BAG URINE DRAINAGE (UROLOGICAL SUPPLIES) ×3 IMPLANT
BASKET STONE NCOMPASS (UROLOGICAL SUPPLIES) ×3 IMPLANT
BASKET STONE NITINOL 3FRX115MB (UROLOGICAL SUPPLIES) IMPLANT
BASKET ZERO TIP NITINOL 2.4FR (BASKET) IMPLANT
BENZOIN TINCTURE PRP APPL 2/3 (GAUZE/BANDAGES/DRESSINGS) ×6 IMPLANT
BLADE SURG 15 STRL LF DISP TIS (BLADE) ×1 IMPLANT
BLADE SURG 15 STRL SS (BLADE) ×2
CATH FOLEY 2W COUNCIL 20FR 5CC (CATHETERS) ×3 IMPLANT
CATH IMAGER II 65CM (CATHETERS) IMPLANT
CATH ROBINSON RED A/P 20FR (CATHETERS) IMPLANT
CATH URET DUAL LUMEN 6-10FR 50 (CATHETERS) IMPLANT
CATH X-FORCE N30 NEPHROSTOMY (TUBING) ×6 IMPLANT
COVER SURGICAL LIGHT HANDLE (MISCELLANEOUS) ×3 IMPLANT
DRAPE C-ARM 42X120 X-RAY (DRAPES) ×3 IMPLANT
DRAPE LINGEMAN PERC (DRAPES) ×3 IMPLANT
DRAPE SURG IRRIG POUCH 19X23 (DRAPES) ×3 IMPLANT
DRSG PAD ABDOMINAL 8X10 ST (GAUZE/BANDAGES/DRESSINGS) ×6 IMPLANT
DRSG TEGADERM 8X12 (GAUZE/BANDAGES/DRESSINGS) ×6 IMPLANT
EXTRACTOR STONE NITINOL NGAGE (UROLOGICAL SUPPLIES) IMPLANT
FIBER LASER FLEXIVA 365 (UROLOGICAL SUPPLIES) IMPLANT
FIBER LASER TRAC TIP (UROLOGICAL SUPPLIES) IMPLANT
GAUZE SPONGE 4X4 12PLY STRL (GAUZE/BANDAGES/DRESSINGS) ×3 IMPLANT
GLOVE BIO SURGEON STRL SZ7.5 (GLOVE) ×3 IMPLANT
GOWN STRL REUS W/TWL LRG LVL3 (GOWN DISPOSABLE) ×3 IMPLANT
GUIDEWIRE AMPLAZ .035X145 (WIRE) ×6 IMPLANT
KIT BASIN OR (CUSTOM PROCEDURE TRAY) ×3 IMPLANT
MANIFOLD NEPTUNE II (INSTRUMENTS) ×3 IMPLANT
NS IRRIG 1000ML POUR BTL (IV SOLUTION) ×3 IMPLANT
PACK CYSTO (CUSTOM PROCEDURE TRAY) ×3 IMPLANT
PAD ABD 8X10 STRL (GAUZE/BANDAGES/DRESSINGS) ×6 IMPLANT
PROBE LITHOCLAST ULTRA 3.8X403 (UROLOGICAL SUPPLIES) ×3 IMPLANT
PROBE PNEUMATIC 1.0MMX570MM (UROLOGICAL SUPPLIES) ×3 IMPLANT
SPONGE LAP 4X18 X RAY DECT (DISPOSABLE) ×3 IMPLANT
STENT URET 6FRX26 CONTOUR (STENTS) ×3 IMPLANT
STONE CATCHER W/TUBE ADAPTER (UROLOGICAL SUPPLIES) ×3 IMPLANT
SUT SILK 2 0 30  PSL (SUTURE) ×4
SUT SILK 2 0 30 PSL (SUTURE) ×2 IMPLANT
SYR 10ML LL (SYRINGE) ×3 IMPLANT
SYR 20CC LL (SYRINGE) ×6 IMPLANT
TOWEL OR NON WOVEN STRL DISP B (DISPOSABLE) ×3 IMPLANT
TRAY FOLEY W/METER SILVER 16FR (SET/KITS/TRAYS/PACK) ×3 IMPLANT
TUBING CONNECTING 10 (TUBING) ×4 IMPLANT
TUBING CONNECTING 10' (TUBING) ×2
WATER STERILE IRR 1500ML POUR (IV SOLUTION) ×3 IMPLANT

## 2016-11-20 NOTE — Anesthesia Postprocedure Evaluation (Signed)
Anesthesia Post Note  Patient: Allison [name]  Procedure(s) Performed: Procedure(s) (LRB): NEPHROLITHOTOMY PERCUTANEOUS (Right)     Patient location during evaluation: PACU Anesthesia Type: General Level of consciousness: awake and alert Pain management: pain level controlled Vital Signs Assessment: post-procedure vital signs reviewed and stable Respiratory status: spontaneous breathing, nonlabored ventilation, respiratory function stable and patient connected to nasal cannula oxygen Cardiovascular status: blood pressure returned to baseline and stable Postop Assessment: no signs of nausea or vomiting Anesthetic complications: no    Last Vitals:  Vitals:   11/20/16 1515 11/20/16 1530  BP: (!) 127/56 (!) 114/54  Pulse: 84 80  Resp: (!) 21 15  Temp:  36.6 C  SpO2: 99% 98%    Last Pain:  Vitals:   11/20/16 1530  TempSrc:   PainSc: 2                  Catalina Gravel

## 2016-11-20 NOTE — Op Note (Signed)
PCNL Operative Note  Preoperative Diagnosis: Nephrolithiasis    Postoperative Diagnosis: Same   Procedure(s) Performed: 1. Right percutaneous nephrostolithotomy for stone burden greater than 2 cm 2. Antegrade nephrostogram with nephrostomy tube placement 3. Simple Foley catheter placement 4. Intraoperative fluoroscopy with interpretation less than 1 hour  Surgeons: Primary: Nickie Retort, MD  Resident Assistant: Jonna Clark, MD  Anesthesia: Anesthesiologist: Catalina Gravel, MD CRNA: Victoriano Lain, CRNA; Williford, Peggy D, CRNA  Endotracheal Tube  Indications for Surgery:  Allison [name] is a 61 y.o.-year-old female with a history of nephrolithiasis.  The patient was evaluated and noted with a~ 6 cm total stone burden in the right kidney, complete staghorn calculus3.  The patient presents today for percutaneous treatment of Right kidney stone. The risks and benefits of the procedure were discussed with the patient who wishes to proceed.     Operative Findings: Preprocedure KUB demonstrated that stone was note visible under xray. Retrograde pyelogram demonstrated significant filling defect with nephroureteral stent entering the mid/upper renal pole. Treatment of the entirety of the stone burden.  Antegrade nephrostogram post-treatment demonstrated no contrast extravasation. Successful placement of nephrostomy tube and JJ ureteral stent.  Procedure Details:       The patient was correctly identified in the preoperative holding area where written informed consent as well potential risks and complications were reviewed. The patient was brought back to the operative suite where a preinduction timeout was performed. After correct information was verified, general anesthesia was induced via endotracheal tube.  The patient was then gently placed in prone position.  Sequential compression devices were placed for VTE prophylaxis. A 16 French Foley catheter was placed per  urethra with return of urine with 10 mL's of water in the balloon. The patient was prepped and draped in the usual sterile fashion and appropriate periprocedural antibiotics were administered. A second timeout was  performed.  Nephroureteral stent was noted to be sutured in place by VIR, entering the right flank.  A superstiff Amplatz wire was advanced through the nephroureteral stent under fluoroscopic guidance, and advancement into the bladder was confirmed on fluoroscopy prior to remove of the nephroureteral catheter. Next, a dual lumen catheter was advanced over our wire into the kidney, and an anterograde nephrostogram was shot. 20cc of 50% isovue contrast was inserted via our second lumen of the catheter, which delineated appropriate position and access into the kidney. Large filling defect was noted consistent with patients imaging. No contrast extravasation was noted. A second superstiff wire was placed through our second lumen into the kidney, down the ureter and entered the bladder under fluoroscopic confirmation. The dual lumen was removed.   Next, using a combination of dilators and wires we then developed our percutaneous tract with the advancement of a 30 French x 20 cm Nephromax balloon dilator.  After this, a 30 French sheath was advanced to the edge of the distal calyx on fluoroscopy.     We then performed rigid nephroscopy with the lithotrite. Immediately upon entrance into the collecting system, we encountered the stone and we then proceeded to treat the stone with lithotripsy. After reaching the limit of the stone capable of being treated with the rigid scope, we then switched to flexible nephroscopy and removed additional stone using baskets. The majority of the calyceal stone easy broke up and was able to be removed manually. Given dilation of the urerter, we were able to visualize the majority of the proximal ureter using flexible nephroscopy, and a basket  was advanced into the ureter  and used to clear the ureter of large stone in an antegrade fashion.  At the conclusion of the procedure, we repeated flexible nephroscopy in the kidney as well and withdrew our sheath over our rigid nephroscope to the edge of renal parenchyma which did not reveal any residual stone fragments.   At this point, we elected to leave our stent and nephrostomy tube. Over one wire, a JJ 6Fr x 20cm ureteral stent was advanced under visualization down our ureter, and placement into the bladder was confirmed on fluoroscopy prior to wire removal. A proximal curl was confirmed in the renal pelvis, and curl was noted in the bladder on fluoroscopy. Next, we passed a 20 French [place] tip nephrostomy tube into our sheath. Antegrade nephrostogram demonstrated complete filling of the entire renal collecting system with minimal contrast extravasation from the access tract and satisfactory placement of her nephrostomy tube in the renal pelvis.  3 mL's of contrast was placed in the balloon and the nephrostomy tube was affixed to the patient's skin using a single 0 silk suture in an interrupted fashion.  We then placed the nephrostomy tube to drainage. A final nephrostogram confirmed appropirate placement of the nephrostomy tube in the renal pelvis and no evidence of contrast extravastion.  The site was then dressed in the usual gauze and tape dressing and the patient was carefully returned to supine position. The patient was awoken from anesthesia and taken to the recovery area.  Estimated Blood Loss:  100cc         Drains: 33 [place] council tip nephrostomy tube, 6Fr x 20cm JJ right ureteral stent without dangler, 16 French Foley catheter with 10 cc in the balloon.  Both to drainage.         Total IV Fluids: See anesthesia record         Specimens: Stone for chemical analysis                 Complications:  None         Disposition: PACU - hemodynamically stable.         Condition: stable    Post-Op  Plan/Instructions:    1. Admit patient to Urology Service for routine postoperative care. 2.  We will perform laboratory studies as well as obtain a CT scan postoperative day #1 to assess for any residual stone burden.  Jonna Clark, MD Urologic Surgery Resident

## 2016-11-20 NOTE — Procedures (Signed)
R PCN 5 Fr EBL 0 Comp 0

## 2016-11-20 NOTE — H&P (Signed)
CC: I have kidney stones.  HPI: Allison [name] is a 61 year-old female established patient who is here for renal calculi.  The problem is on the right side. This is not her first kidney stone. She is not currently having flank pain, back pain, groin pain, nausea, vomiting, fever or chills. She has not caught a stone in her urine strainer since her symptoms began.   She has had ureteral stent and ureteroscopy for treatment of her stones in the past.   The patient presents for follow-up for her large right staghorn calculus that takes up most of her right collecting system. She previously underwent ureteroscopy a few months ago on the right side for a 5 mm calculus in the ureter that had since passed. Her staghorn calculus was not interested at that time as she was having symptomatic cholelithiasis. The plan at that time was to remove her ureteral stone so that her right kidney could drain prior to undergoing cholecystectomy. She has now had her cholecystectomy presents today to discuss addressing her right staghorn calculus.   Of note the right staghorn calculus was not visible on fluoroscopy suggesting that it is low density.   GFR is 29.   Had PE x1 many years ago. Is on coumadin. PCP considering stopping coumadin. Has gotten clearance to stop in the past.     ALLERGIES: No Allergies Ultram    MEDICATIONS: Levothyroxine Sodium 125 mcg tablet  Warfarin Sodium 4 mg tablet  ALPRAZolam 0.25 MG Oral Tablet Oral  GlyBURIDE 5 MG Oral Tablet Oral  Lisinopril-Hydrochlorothiazide 20-12.5 MG Oral Tablet Oral  MetFORMIN HCl - 1000 MG Oral Tablet Oral  Pravastatin Sodium 40 MG Oral Tablet Oral  Singulair 10 MG Oral Tablet Oral  Verapamil HCl ER 120 MG Oral Tablet Extended Release Oral  Zyrtec 10 MG TABS Oral     GU PSH: Cystoscopy Insert Stent, Right - 04/17/2016 Cystoscopy Ureteroscopy, Right - 04/17/2016      PSH Notes: Cesarean Section   NON-GU PSH: Cesarean Delivery Only -  2010 Hysterectomy Thyroid Surgery    GU PMH: Renal calculus - 02/20/2016 Ureteral calculus - 02/20/2016, Calculus of ureter, - 2014 Abdominal Pain Unspec, Abdominal pain - 2014 Acute Cystitis/UTI, Acute cystitis without hematuria - 2014 Gross hematuria, Gross hematuria - 2014 History of urolithiasis, Nephrolithiasis - 2014 Stress Incontinence, Female stress incontinence - 2014      PMH Notes:  1898-04-02 00:00:00 - Note: Normal Routine History And Physical Adult  2008-08-06 15:13:45 - Note: Arthritis   NON-GU PMH: Anxiety, Anxiety - 2014 Asthma, Asthma - 2014 Personal history of other diseases of the circulatory system, History of hypertension - 2014 Personal history of other endocrine, nutritional and metabolic disease, History of diabetes mellitus - 2014, History of hypercholesterolemia, - 2014 Arthritis Depression Diabetes Type 2 GERD Hypercholesterolemia Hypertension    FAMILY HISTORY: 1 Daughter - Runs in Family 1 son - Son adopted - No Family History Family Health Status - Mother's Age - Runs In Family Family Health Status Number - Runs In Family Father Deceased At Age86 ___ - Runs In Family   SOCIAL HISTORY: Marital Status: Married Current Smoking Status: Patient has never smoked.  Has never drank.  Does not drink caffeine. 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     Notes: Marital History - Currently Married, Tobacco Use, Alcohol Use, Occupation:, Caffeine Use   REVIEW OF SYSTEMS:    GU Review Female:   Patient denies frequent urination, hard to postpone urination, burning /pain with  urination, get up at night to urinate, leakage of urine, stream starts and stops, trouble starting your stream, have to strain to urinate, and currently pregnant.  Gastrointestinal (Upper):   Patient denies nausea, vomiting, and indigestion/ heartburn.  Gastrointestinal (Lower):   Patient reports diarrhea. Patient denies constipation.  Constitutional:   Patient denies fever,  night sweats, weight loss, and fatigue.  Skin:   Patient denies skin rash/ lesion and itching.  Eyes:   Patient denies blurred vision and double vision.  Ears/ Nose/ Throat:   Patient denies sore throat and sinus problems.  Hematologic/Lymphatic:   Patient denies swollen glands and easy bruising.  Cardiovascular:   Patient denies leg swelling and chest pains.  Respiratory:   Patient denies cough and shortness of breath.  Endocrine:   Patient denies excessive thirst.  Musculoskeletal:   Patient denies back pain and joint pain.  Neurological:   Patient denies dizziness and headaches.  Psychologic:   Patient denies depression and anxiety.   VITAL SIGNS:      07/31/2016 10:50 AM  Weight 233 lb / 105.69 kg  Height 66 in / 167.64 cm  BP 152/81 mmHg  Pulse 113 /min  Temperature 99.4 F / 37 C  BMI 37.6 kg/m   MULTI-SYSTEM PHYSICAL EXAMINATION:    Constitutional: Well-nourished. No physical deformities. Normally developed. Good grooming.  Neck: Neck symmetrical, not swollen. Normal tracheal position.  Respiratory: No labored breathing, no use of accessory muscles.   Cardiovascular: Normal temperature, normal extremity pulses, no swelling, no varicosities.  Skin: No paleness, no jaundice, no cyanosis. No lesion, no ulcer, no rash.  Gastrointestinal: No mass, no tenderness, no rigidity, non obese abdomen.  Eyes: Normal conjunctivae. Normal eyelids.  Ears, Nose, Mouth, and Throat: Left ear no scars, no lesions, no masses. Right ear no scars, no lesions, no masses. Nose no scars, no lesions, no masses. Normal hearing. Normal lips.  Musculoskeletal: Normal gait and station of head and neck.     PAST DATA REVIEWED:  Source Of History:  Patient  Records Review:   Previous Hospital Records  X-Ray Review: [facility] Protocol: Reviewed Films. Reviewed Report. Discussed With Patient.     PROCEDURES:          Urinalysis w/Scope Dipstick Dipstick Cont'd Micro  Color: Yellow Bilirubin: Neg  WBC/hpf: 0 - 5/hpf  Appearance: Cloudy Ketones: Neg RBC/hpf: 3 - 10/hpf  Specific Gravity: 1.020 Blood: 3+ Bacteria: Rare (0-9/hpf)  pH: 5.5 Protein: 2+ Cystals: Amorph Urates  Glucose: Neg Urobilinogen: 0.2 Casts: NS (Not Seen)    Nitrites: Neg Trichomonas: Not Present    Leukocyte Esterase: 1+ Mucous: Not Present      Epithelial Cells: 0 - 5/hpf      Yeast: NS (Not Seen)      Sperm: Not Present    Notes: UNCONCENTRATED MICROSCOPIC    ASSESSMENT:      ICD-10 Details  1 GU:   Renal calculus - N20.0    PLAN:           Orders Labs Urine Culture          Schedule Procedure: Unspecified Date - PCNL - 50080, right          Document Letter(s):  Created for Patient: Clinical Summary   Created for Elvera Maria, MD         Notes:   Proceed with right PCNL today.

## 2016-11-20 NOTE — Transfer of Care (Signed)
Immediate Anesthesia Transfer of Care Note  Patient: Allison [name]  Procedure(s) Performed: Procedure(s): NEPHROLITHOTOMY PERCUTANEOUS (Right)  Patient Location: PACU  Anesthesia Type:General  Level of Consciousness: awake, alert  and oriented  Airway & Oxygen Therapy: Patient Spontanous Breathing and Patient connected to face mask oxygen  Post-op Assessment: Report given to RN and Post -op Vital signs reviewed and stable  Post vital signs: Reviewed and stable  Last Vitals:  Vitals:   11/20/16 0700  BP: 106/66  Pulse: 96  Resp: 18  Temp: 36.6 C  SpO2: 100%    Last Pain:  Vitals:   11/20/16 0700  TempSrc: Oral         Complications: No apparent anesthesia complications

## 2016-11-20 NOTE — H&P (Signed)
Chief Complaint: Patient was seen in consultation today for renal stone  Referring Physician(s): Nickie Retort  Supervising Physician: Marybelle Killings  Patient Status: Allison [name]  History of Present Illness: Allison [name] is a 61 y.o. female with past medical history of anxiety, astham, HLD, HTN, kidney stones, PE on Coumadin who presents with renal stone in right collecting system.   IR consulted for nephroureteral stent placement at the request of Dr. Pilar Jarvis.   Patient has been NPO.  She held her Coumadin since last Wednesday.  Of note, she did undergo lap cholecystectomy prior to this procedure due to cholelithiasis. She presents today in her usual state of health.     Past Medical History:  Diagnosis Date  . Anxiety   . Arthritis    "knees" (06/04/2016)  . Asthma    seasonal  . Chronic bronchitis ([place])   . History of gout   . History of kidney stones   . Hyperlipidemia   . Hypertension   . Hypothyroidism   . Pneumonia ~ 2013; 09/2015; 03/2016  . Pulmonary embolism ([place]) ~ 2013   "2 big ones",   . Thyroid goiter   . Type II diabetes mellitus ([place])   . Uterine cancer ([place])   . Ventral hernia     Past Surgical History:  Procedure Laterality Date  . CHOLECYSTECTOMY N/A 06/04/2016   Procedure: LAPAROSCOPIC CHOLECYSTECTOMY;  Surgeon: Rolm Bookbinder, MD;  Location: [facility];  Service: General;  Laterality: N/A;  LAPAROSCOPIC CHOLECYSTECTOMY  . CYSTOSCOPY WITH RETROGRADE PYELOGRAM, URETEROSCOPY AND STENT PLACEMENT Right 04/17/2016   Procedure: CYSTOSCOPY WITH RETROGRADE PYELOGRAM, URETEROSCOPY AND STENT PLACEMENT;  Surgeon: Nickie Retort, MD;  Location: [facility];  Service: Urology;  Laterality: Right;  . DILATION AND CURETTAGE OF UTERUS     S/P miscarriage  . LAPAROSCOPIC CHOLECYSTECTOMY  06/04/2016  . TONSILLECTOMY    . TOTAL ABDOMINAL HYSTERECTOMY  2000s   "endometrial CA"  . TOTAL THYROIDECTOMY     "had goiter on it"    Allergies: Oxycodone;  Simvastatin; and Tramadol hcl  Medications: Prior to Admission medications   Medication Sig Start Date End Date Taking? Authorizing Provider  acetaminophen (TYLENOL) 500 MG tablet Take 1,000 mg by mouth every 8 (eight) hours as needed for mild pain or headache.     [provider]  ALPRAZolam Duanne Moron) 0.5 MG tablet Take 1 tablet (0.5 mg total) by mouth 3 (three) times daily as needed for anxiety. 03/02/16   McKeag, Marylynn Pearson, MD  cetirizine (ZYRTEC) 10 MG tablet Take 10 mg by mouth daily.      [provider]  glyBURIDE (DIABETA) 5 MG tablet TAKE 1 TABLET TWICE A DAY WITH MEALS 09/28/16   McKeag, Marylynn Pearson, MD  HYDROcodone-acetaminophen (NORCO) 5-325 MG tablet Take 1 tablet by mouth every 4 (four) hours as needed for moderate pain. Patient not taking: Reported on 11/14/2016 06/05/16   Kalman Drape, PA  levothyroxine (SYNTHROID, LEVOTHROID) 125 MCG tablet Take 1 tablet (125 mcg total) by mouth daily before breakfast. 04/05/16   McKeag, Marylynn Pearson, MD  lisinopril (PRINIVIL,ZESTRIL) 10 MG tablet Take 1 tablet (10 mg total) by mouth daily. Patient not taking: Reported on 11/14/2016 06/19/16   McKeag, Marylynn Pearson, MD  lisinopril-hydrochlorothiazide (PRINZIDE,ZESTORETIC) 20-12.5 MG tablet Take 1 tablet by mouth daily.    [provider]  metFORMIN (GLUCOPHAGE) 1000 MG tablet Take 1 tablet (1,000 mg total) by mouth 2 (two) times daily with a meal. 03/15/16   McKeag,  Marylynn Pearson, MD  montelukast (SINGULAIR) 10 MG tablet Take 1 tablet (10 mg total) by mouth at bedtime. Patient not taking: Reported on 11/14/2016 07/20/16   McKeag, Marylynn Pearson, MD  pravastatin (PRAVACHOL) 40 MG tablet Take 1 tablet (40 mg total) by mouth daily. Patient taking differently: Take 40 mg by mouth at bedtime.  03/15/16   McKeag, Marylynn Pearson, MD  PROAIR HFA 108 [phone] Base) MCG/ACT inhaler USE 2 INHALATIONS EVERY 6 HOURS AS NEEDED FOR WHEEZING OR SHORTNESS OF BREATH 10/01/16   Carlyle Dolly, MD  warfarin (COUMADIN) 4 MG tablet Take as  directed Patient taking differently: Take 2-4 mg by mouth See admin instructions. TAKE 0.5 TABLET (2 MG) ON Monday & Friday; TAKE 1 TABLET (4MG ) ON ALL OTHER DAYS 07/20/16   McKeag, Marylynn Pearson, MD     Family History  Problem Relation Age of Onset  . Adopted: Yes  . CAD Brother 22    Social History   Social History  . Marital status: Married    Spouse name: N/A  . Number of children: 2  . Years of education: N/A   Social History Main Topics  . Smoking status: Never Smoker  . Smokeless tobacco: Never Used  . Alcohol use No  . Drug use: No  . Sexual activity: No   Other Topics Concern  . Not on file   Social History Narrative   Patient cares for 2 grand children while her daughter is at work. This limits her ability to have appointments.    Review of Systems  Constitutional: Negative for fatigue and fever.  Respiratory: Negative for cough and shortness of breath.   Cardiovascular: Negative for chest pain.  Gastrointestinal: Negative for abdominal pain.  Musculoskeletal: Negative for back pain.  Psychiatric/Behavioral: Negative for behavioral problems and confusion.    Vital Signs: There were no vitals taken for this visit.  Physical Exam  Constitutional: She is oriented to person, place, and time. She appears well-developed.  Cardiovascular: Normal rate, regular rhythm and normal heart sounds.   Pulmonary/Chest: Effort normal and breath sounds normal. No respiratory distress.  Abdominal: Soft.  Neurological: She is alert and oriented to person, place, and time.  Skin: Skin is warm and dry.  Psychiatric: She has a normal mood and affect. Her behavior is normal. Judgment and thought content normal.  Nursing note and vitals reviewed.   Mallampati Score:  MD Evaluation Airway: WNL Heart: WNL Abdomen: WNL Chest/ Lungs: WNL ASA  Classification: 3 Mallampati/Airway Score: Two  Imaging: No results found.  Labs:  CBC:  Recent Labs  01/10/16 1945 04/13/16 1329  05/29/16 0926 11/16/16 0955  WBC 7.6 8.5 6.1 6.7  HGB 12.0 12.1 11.5* 12.1  HCT 36.7 37.2 36.9 37.7  PLT 247 245 226 206    COAGS:  Recent Labs  08/17/16 0857 09/14/16 0846 10/12/16 0934 11/20/16 0804  INR 1.9 2.8 3.1 0.96  APTT  --   --   --  25    BMP:  Recent Labs  04/13/16 1329 05/29/16 0926 11/16/16 0955 11/20/16 0803  NA 138 142 142 140  K 4.2 4.9 4.5 4.1  CL 100* 104 104 105  CO2 27 25 26 24   GLUCOSE 142* 106* 92 125*  BUN 27* 24* 28* 30*  CALCIUM 8.1* 7.7* 8.6* 7.7*  CREATININE 1.40* 1.50* 1.73* 1.67*  GFRNONAA 40* 37* 31* 32*  GFRAA 46* 43* 36* 37*    LIVER FUNCTION TESTS:  Recent Labs  01/10/16 1945 05/29/16 0926  BILITOT 0.2* 0.6  AST 17 15  ALT 13* 11*  ALKPHOS 46 51  PROT 7.8 7.2  ALBUMIN 4.1 3.8    TUMOR MARKERS: No results for input(s): AFPTM, CEA, CA199, CHROMGRNA in the last 8760 hours.  Assessment and Plan: Patient with past medical history of DM, asthma, GERD, HTN presents with complaint of renal stone.  IR consulted for nephroureteral stent placement at the request of Dr. Pilar Jarvis. Case reviewed by Dr. Barbie Banner who approves patient for procedure.  Patient presents today in their usual state of health.  She has been NPO and is has appropriately held her blood thinners.  Risks and benefits discussed with the patient including, but not limited to infection, bleeding, significant bleeding causing loss or decrease in renal function or damage to adjacent structures.  All of the patient's questions were answered, patient is agreeable to proceed. Consent signed and in chart.   Thank you for this interesting consult.  I greatly enjoyed meeting CHRYS LANDGREBE and look forward to participating in their care.  A copy of this report was sent to the requesting provider on this date.  Electronically Signed: Docia Barrier, PA 11/20/2016, 8:52 AM   I spent a total of  30 Minutes   in face to face in clinical consultation, greater  than 50% of which was counseling/coordinating care for renal stone

## 2016-11-20 NOTE — Progress Notes (Signed)
Patient unable to void again, unable to collect urine culture

## 2016-11-20 NOTE — Anesthesia Procedure Notes (Signed)
Procedure Name: Intubation Date/Time: 11/20/2016 11:42 AM Performed by: Noralyn Pick D Pre-anesthesia Checklist: Patient identified, Emergency Drugs available, Suction available and Patient being monitored Patient Re-evaluated:Patient Re-evaluated prior to induction Oxygen Delivery Method: Circle system utilized Preoxygenation: Pre-oxygenation with 100% oxygen Induction Type: IV induction Ventilation: Mask ventilation without difficulty Laryngoscope Size: Mac and 4 Grade View: Grade I Tube type: Oral Tube size: 7.5 mm Number of attempts: 1 Airway Equipment and Method: Stylet Placement Confirmation: ETT inserted through vocal cords under direct vision,  positive ETCO2 and breath sounds checked- equal and bilateral Secured at: 21 cm Tube secured with: Tape Dental Injury: Teeth and Oropharynx as per pre-operative assessment

## 2016-11-20 NOTE — Anesthesia Preprocedure Evaluation (Addendum)
Anesthesia Evaluation  Patient identified by MRN, date of birth, ID band Patient awake    Reviewed: Allergy & Precautions, NPO status , Patient's Chart, lab work & pertinent test results  Airway Mallampati: III  TM Distance: <3 FB Neck ROM: Full    Dental  (+) Teeth Intact, Dental Advisory Given   Pulmonary asthma , PE   Pulmonary exam normal breath sounds clear to auscultation       Cardiovascular hypertension, Pt. on medications (-) angina(-) CAD, (-) Past MI and (-) CHF Normal cardiovascular exam Rhythm:Regular Rate:Normal     Neuro/Psych PSYCHIATRIC DISORDERS Anxiety Depression negative neurological ROS     GI/Hepatic Neg liver ROS, GERD  ,  Endo/Other  diabetes, Type 2, Oral Hypoglycemic AgentsHypothyroidism Obesity   Renal/GU Renal InsufficiencyRenal disease     Musculoskeletal  (+) Arthritis , Osteoarthritis,    Abdominal   Peds  Hematology  (+) Blood dyscrasia (Warfarin), anemia ,   Anesthesia Other Findings Day of surgery medications reviewed with the patient.  Reproductive/Obstetrics                             Anesthesia Physical  Anesthesia Plan  ASA: III  Anesthesia Plan: General   Post-op Pain Management:    Induction: Intravenous  PONV Risk Score and Plan: 3 and Ondansetron, Dexamethasone and Midazolam  Airway Management Planned: Oral ETT  Additional Equipment:   Intra-op Plan:   Post-operative Plan: Extubation in OR  Informed Consent: I have reviewed the patients History and Physical, chart, labs and discussed the procedure including the risks, benefits and alternatives for the proposed anesthesia with the patient or authorized representative who has indicated his/her understanding and acceptance.   Dental advisory given  Plan Discussed with: CRNA  Anesthesia Plan Comments: (Risks/benefits of general anesthesia discussed with patient including risk of  damage to teeth, lips, gum, and tongue, nausea/vomiting, allergic reactions to medications, and the possibility of heart attack, stroke and death.  All patient questions answered.  Patient wishes to proceed.)        Anesthesia Quick Evaluation  

## 2016-11-21 ENCOUNTER — Ambulatory Visit (HOSPITAL_COMMUNITY): Payer: BLUE CROSS/BLUE SHIELD

## 2016-11-21 DIAGNOSIS — N200 Calculus of kidney: Secondary | ICD-10-CM | POA: Diagnosis not present

## 2016-11-21 LAB — BASIC METABOLIC PANEL
ANION GAP: 8 (ref 5–15)
BUN: 28 mg/dL — ABNORMAL HIGH (ref 6–20)
CO2: 23 mmol/L (ref 22–32)
Calcium: 6.3 mg/dL — CL (ref 8.9–10.3)
Chloride: 106 mmol/L (ref 101–111)
Creatinine, Ser: 1.7 mg/dL — ABNORMAL HIGH (ref 0.44–1.00)
GFR calc Af Amer: 36 mL/min — ABNORMAL LOW (ref >60)
GFR, EST NON AFRICAN AMERICAN: 31 mL/min — AB (ref >60)
GLUCOSE: 205 mg/dL — AB (ref 65–99)
POTASSIUM: 4.4 mmol/L (ref 3.5–5.1)
Sodium: 137 mmol/L (ref 135–145)

## 2016-11-21 LAB — HEMOGLOBIN AND HEMATOCRIT, BLOOD
HEMATOCRIT: 28.9 % — AB (ref 36.0–46.0)
Hemoglobin: 9.5 g/dL — ABNORMAL LOW (ref 12.0–15.0)

## 2016-11-21 LAB — GLUCOSE, CAPILLARY
GLUCOSE-CAPILLARY: 162 mg/dL — AB (ref 65–99)
GLUCOSE-CAPILLARY: 129 mg/dL — AB (ref 65–99)
Glucose-Capillary: 207 mg/dL — ABNORMAL HIGH (ref 65–99)

## 2016-11-21 MED ORDER — OXYBUTYNIN CHLORIDE ER 10 MG PO TB24: 10.0000 mg | ORAL_TABLET | Freq: Every day | ORAL | 0 refills | Status: DC | PRN

## 2016-11-21 MED ORDER — HYDROCODONE-ACETAMINOPHEN 5-325 MG PO TABS: 1.0000 | ORAL_TABLET | ORAL | 0 refills | Status: DC | PRN

## 2016-11-21 MED ORDER — SULFAMETHOXAZOLE-TRIMETHOPRIM 800-160 MG PO TABS: 1.0000 | ORAL_TABLET | Freq: Two times a day (BID) | ORAL | 0 refills | Status: AC

## 2016-11-21 MED ORDER — TAMSULOSIN HCL 0.4 MG PO CAPS: 0.4000 mg | ORAL_CAPSULE | Freq: Every day | ORAL | 0 refills | Status: DC

## 2016-11-21 NOTE — Progress Notes (Signed)
CRITICAL VALUE ALERT  Critical Value:  Calcium 6.3  Date & Time Notied:  11/21/16 06:30  Provider Notified: Pilar Jarvis  Orders Received/Actions taken: No new orders at this time

## 2016-11-21 NOTE — Progress Notes (Signed)
No events overnight Pain well controlled on PO meds. Minimal flank pain No n/v/f/c  CT with small residual stone burden. R PCN migrated out of kidney. Stent in correct position  Vitals:   11/20/16 1545 11/20/16 2013 11/20/16 2338 11/21/16 0404  BP: 130/68 (!) 123/54 (!) 115/51 (!) 91/47  Pulse: 78 85 90 87  Resp: 14 15 16 18   Temp: 97.9 F (36.6 C) 99.3 F (37.4 C) 98.7 F (37.1 C) 99.3 F (37.4 C)  TempSrc:  Oral Oral Oral  SpO2: 100% 97% 96% 94%  Weight:      Height:       I/O last 3 completed shifts: In: 2545 [P.O.:120; I.V.:2225; IV Piggyback:200] Out: 4944 [Urine:1575; Blood:200] No intake/output data recorded.  NAD Soft NT ND Minimal right flank TTP Foley clear PCN with scant output  CBC    Component Value Date/Time   WBC 6.1 11/20/2016 0803   RBC 4.02 11/20/2016 0803   HGB 9.5 (L) 11/21/2016 0520   HCT 28.9 (L) 11/21/2016 0520   PLT 199 11/20/2016 0803   MCV 89.3 11/20/2016 0803   MCH 29.1 11/20/2016 0803   MCHC 32.6 11/20/2016 0803   RDW 15.6 (H) 11/20/2016 0803   LYMPHSABS 1.7 05/29/2016 0926   MONOABS 0.4 05/29/2016 0926   EOSABS 0.2 05/29/2016 0926   BASOSABS 0.0 05/29/2016 0926   BMP Latest Ref Rng & Units 11/21/2016 11/20/2016 11/16/2016  Glucose 65 - 99 mg/dL 205(H) 125(H) 92  BUN 6 - 20 mg/dL 28(H) 30(H) 28(H)  Creatinine 0.44 - 1.00 mg/dL 1.70(H) 1.67(H) 1.73(H)  Sodium 135 - 145 mmol/L 137 140 142  Potassium 3.5 - 5.1 mmol/L 4.4 4.1 4.5  Chloride 101 - 111 mmol/L 106 105 104  CO2 22 - 32 mmol/L 23 24 26   Calcium 8.9 - 10.3 mg/dL 6.3(LL) 7.7(L) 8.6(L)   POD 1 R PCNL. Doing well.  -d/c foley -will d/c PCN later this morning as it has migrated out of kidney -plan for d/c home later today -will need outpatient URS in approximately one month to clear residual stone burden

## 2016-11-21 NOTE — Discharge Summary (Signed)
[facility] Discharge Summary  Admit date: 11/20/2016  Discharge date and time: 11/21/16   Discharge to: Home  Discharge Service: Urology  Discharge Attending Physician:  Dr. Baruch Gouty  Discharge  Diagnoses: Nephrolithiasis  Secondary Diagnosis: Active Problems:   Nephrolithiasis   OR Procedures: Procedure(s): NEPHROLITHOTOMY PERCUTANEOUS 11/20/2016   Ancillary Procedures: None   Discharge Day Services: The patient was seen and examined by the Urology team both in the morning and immediately prior to discharge.  Vital signs and laboratory values were stable and within normal limits.  The physical exam was benign and unchanged and all surgical wounds were examined.  Discharge instructions were explained and all questions answered.  Subjective  No acute events overnight. Pain Controlled. No fever or chills.  Objective Patient Vitals for the past 8 hrs:  BP Temp Temp src Pulse Resp SpO2  11/21/16 0937 (!) 100/55 98.1 F (36.7 C) Oral 75 18 97 %   Total I/O In: -  Out: 450 [Urine:450]  General Appearance:        No acute distress Lungs:                 Normal work of breathing on room air Heart:                             Regular rate and rhythm Abdomen:                       Soft, non-tender, non-distended. Right PCN incision site clean and dry, dressed.  Extremities:                  Warm and well perfused    Hospital Course:  The patient underwent right percutaneous nephrolithotomy for a large staghorn calculus on 11/20/2016. JJ stent and right nephrostomy tube were left in place following the procedure. The patient tolerated the procedure well, was extubated in the OR, and afterwards was taken to the PACU for routine post-surgical care. When stable the patient was transferred to the floor.     The patient did well postoperatively.  The patient's diet was slowly advanced and at the time of discharge was tolerating a regular diet. Foley catheter was removed on  POD1 and patient voided spontaneously. Patient underwent CT abdomen pelvis on POD 1 to evaluate for residual stone burden. A small amount (<1cm) was present, substantiating future ureteroscopic clearance. Right nephrostomy tube was removed on POD1 without compication, and JJ stent remained in place. The patient was discharged home 1 Day Post-Op, at which point was tolerating a regular solid diet, was able to void spontaneously, have adequate pain control with P.O. pain medication, and could ambulate without difficulty. The patient will follow up with [place] for post op check and to schedule second look ureteroscopy.    Patient was discharged to complete a 5 day course of Bactrim. Urine culture sent preoperatively will be followed up on, and antibiotics adjusted accordingly. She was afebrile with nl VS prior to discharge.  Condition at Discharge: Improved  Discharge Medications:  Allergies as of 11/21/2016      Reactions   Oxycodone Nausea And Vomiting   Simvastatin Other (See Comments)   REACTION: Severe leg pain   Tramadol Hcl Nausea And Vomiting   REACTION: Felt sick      Medication List    STOP taking these medications   warfarin 4 MG tablet Commonly known as:  COUMADIN  TAKE these medications   acetaminophen 500 MG tablet Commonly known as:  TYLENOL Take 1,000 mg by mouth every 8 (eight) hours as needed for mild pain or headache.   ALPRAZolam 0.5 MG tablet Commonly known as:  XANAX Take 1 tablet (0.5 mg total) by mouth 3 (three) times daily as needed for anxiety.   cetirizine 10 MG tablet Commonly known as:  ZYRTEC Take 10 mg by mouth daily.   glyBURIDE 5 MG tablet Commonly known as:  DIABETA TAKE 1 TABLET TWICE A DAY WITH MEALS   HYDROcodone-acetaminophen 5-325 MG tablet Commonly known as:  NORCO Take 1 tablet by mouth every 4 (four) hours as needed for moderate pain.   levothyroxine 125 MCG tablet Commonly known as:  SYNTHROID, LEVOTHROID Take 1 tablet (125 mcg total)  by mouth daily before breakfast.   lisinopril 10 MG tablet Commonly known as:  PRINIVIL,ZESTRIL Take 1 tablet (10 mg total) by mouth daily.   lisinopril-hydrochlorothiazide 20-12.5 MG tablet Commonly known as:  PRINZIDE,ZESTORETIC Take 1 tablet by mouth daily.   metFORMIN 1000 MG tablet Commonly known as:  GLUCOPHAGE Take 1 tablet (1,000 mg total) by mouth 2 (two) times daily with a meal.   montelukast 10 MG tablet Commonly known as:  SINGULAIR Take 1 tablet (10 mg total) by mouth at bedtime.   oxybutynin 10 MG 24 hr tablet Commonly known as:  DITROPAN XL Take 1 tablet (10 mg total) by mouth daily as needed (bladder spasms).   pravastatin 40 MG tablet Commonly known as:  PRAVACHOL Take 1 tablet (40 mg total) by mouth daily. What changed:  when to take this   PROAIR HFA 108 (90 Base) MCG/ACT inhaler Generic drug:  albuterol USE 2 INHALATIONS EVERY 6 HOURS AS NEEDED FOR WHEEZING OR SHORTNESS OF BREATH   sulfamethoxazole-trimethoprim 800-160 MG tablet Commonly known as:  BACTRIM DS,SEPTRA DS Take 1 tablet by mouth 2 (two) times daily.   tamsulosin 0.4 MG Caps capsule Commonly known as:  FLOMAX Take 1 capsule (0.4 mg total) by mouth daily after supper.            Discharge Care Instructions        Start     Ordered   11/21/16 0000  HYDROcodone-acetaminophen (NORCO) 5-325 MG tablet  Every 4 hours PRN     11/21/16 0842   11/21/16 0000  Increase activity slowly     11/21/16 0973   11/21/16 0000  Discharge instructions    Comments:  You may see some blood in the urine and may have some burning with urination for 48-72 hours. You also may notice that you have to urinate more frequently or urgently after your procedure which is normal.  You should call should you develop an inability urinate, fever > 101, persistent nausea and vomiting that prevents you from eating or drinking to stay hydrated.  If you have a stent, you will likely urinate more frequently and urgently  until the stent is removed and you may experience some discomfort/pain in the lower abdomen and flank especially when urinating. You may take pain medication prescribed to you if needed for pain. You may also intermittently have blood in the urine until the stent is removed. If you have a catheter, you will be taught how to take care of the catheter by the nursing staff prior to discharge from the hospital.  You may periodically feel a strong urge to void with the catheter in place.  This is a bladder spasm and most  often can occur when having a bowel movement or moving around. It is typically self-limited and usually will stop after a few minutes.  You may use some Vaseline or Neosporin around the tip of the catheter to reduce friction at the tip of the penis. You may also see some blood in the urine.  A very small amount of blood can make the urine look quite red.  As long as the catheter is draining well, there usually is not a problem.  However, if the catheter is not draining well and is bloody, you should call the office [phone]) to notify us.   11/21/16 0842   11/21/16 0000  Diet Carb Modified     11/21/16 0842   11/21/16 0000  Discharge wound care:    Comments:  Change dressing as needed when soiled. Normal to leak for several days.   11/21/16 0842   11/21/16 0000  Call MD for:  temperature >100.4     11/21/16 0842   11/21/16 0000  Call MD for:  persistant nausea and vomiting     11/21/16 0842   11/21/16 0000  Call MD for:  severe uncontrolled pain     11/21/16 0842   11/21/16 0000  Call MD for:  redness, tenderness, or signs of infection (pain, swelling, redness, odor or green/yellow discharge around incision site)     11/21/16 0842   11/21/16 0000  tamsulosin (FLOMAX) 0.4 MG CAPS capsule  Daily after supper     11/21/16 0842   11/21/16 0000  oxybutynin (DITROPAN XL) 10 MG 24 hr tablet  Daily PRN     11/21/16 0842   11/21/16 0000  sulfamethoxazole-trimethoprim (BACTRIM DS,SEPTRA  DS) 800-160 MG tablet  2 times daily     11/21/16 6945      Pending Test Results: Urine culture

## 2016-11-22 LAB — URINE CULTURE: CULTURE: NO GROWTH

## 2016-12-06 ENCOUNTER — Other Ambulatory Visit: Payer: Self-pay | Admitting: Urology

## 2016-12-12 ENCOUNTER — Encounter (HOSPITAL_BASED_OUTPATIENT_CLINIC_OR_DEPARTMENT_OTHER): Payer: Self-pay | Admitting: *Deleted

## 2016-12-12 NOTE — Progress Notes (Signed)
To [facility] at [facility]- Istat on arrival- Ekg with chart.Npo after Mn solids( clear liquids,no pulp,dairy) until 0700-then Npo.will take synthroid,xanax,pain med if needed-to bring proair inhaler.

## 2016-12-18 ENCOUNTER — Encounter (HOSPITAL_BASED_OUTPATIENT_CLINIC_OR_DEPARTMENT_OTHER)
Admission: RE | Disposition: A | Discharge status: Home / Self Care | Payer: Self-pay | Source: Ambulatory Visit | Attending: Urology

## 2016-12-18 ENCOUNTER — Ambulatory Visit (HOSPITAL_BASED_OUTPATIENT_CLINIC_OR_DEPARTMENT_OTHER)
Admission: RE | Admit: 2016-12-18 | Discharge: 2016-12-18 | Disposition: A | Discharge status: Home / Self Care | Payer: BLUE CROSS/BLUE SHIELD | Source: Ambulatory Visit | Attending: Urology | Admitting: Urology

## 2016-12-18 ENCOUNTER — Encounter (HOSPITAL_BASED_OUTPATIENT_CLINIC_OR_DEPARTMENT_OTHER): Payer: Self-pay

## 2016-12-18 ENCOUNTER — Ambulatory Visit (HOSPITAL_BASED_OUTPATIENT_CLINIC_OR_DEPARTMENT_OTHER): Payer: BLUE CROSS/BLUE SHIELD | Admitting: Anesthesiology

## 2016-12-18 DIAGNOSIS — Z79899 Other long term (current) drug therapy: Secondary | ICD-10-CM | POA: Insufficient documentation

## 2016-12-18 DIAGNOSIS — N200 Calculus of kidney: Secondary | ICD-10-CM | POA: Insufficient documentation

## 2016-12-18 DIAGNOSIS — E669 Obesity, unspecified: Secondary | ICD-10-CM | POA: Insufficient documentation

## 2016-12-18 DIAGNOSIS — Z87442 Personal history of urinary calculi: Secondary | ICD-10-CM | POA: Diagnosis not present

## 2016-12-18 DIAGNOSIS — Z6841 Body Mass Index (BMI) 40.0 and over, adult: Secondary | ICD-10-CM | POA: Diagnosis not present

## 2016-12-18 DIAGNOSIS — F419 Anxiety disorder, unspecified: Secondary | ICD-10-CM | POA: Insufficient documentation

## 2016-12-18 DIAGNOSIS — E119 Type 2 diabetes mellitus without complications: Secondary | ICD-10-CM | POA: Diagnosis not present

## 2016-12-18 DIAGNOSIS — Z86711 Personal history of pulmonary embolism: Secondary | ICD-10-CM | POA: Diagnosis not present

## 2016-12-18 DIAGNOSIS — Z7901 Long term (current) use of anticoagulants: Secondary | ICD-10-CM | POA: Diagnosis not present

## 2016-12-18 DIAGNOSIS — Z7984 Long term (current) use of oral hypoglycemic drugs: Secondary | ICD-10-CM | POA: Diagnosis not present

## 2016-12-18 DIAGNOSIS — E7800 Pure hypercholesterolemia, unspecified: Secondary | ICD-10-CM | POA: Diagnosis not present

## 2016-12-18 DIAGNOSIS — K219 Gastro-esophageal reflux disease without esophagitis: Secondary | ICD-10-CM | POA: Insufficient documentation

## 2016-12-18 DIAGNOSIS — I10 Essential (primary) hypertension: Secondary | ICD-10-CM | POA: Insufficient documentation

## 2016-12-18 DIAGNOSIS — F329 Major depressive disorder, single episode, unspecified: Secondary | ICD-10-CM | POA: Insufficient documentation

## 2016-12-18 DIAGNOSIS — D759 Disease of blood and blood-forming organs, unspecified: Secondary | ICD-10-CM | POA: Insufficient documentation

## 2016-12-18 HISTORY — PX: CYSTOSCOPY W/ RETROGRADES: SHX1426

## 2016-12-18 HISTORY — PX: CYSTOSCOPY/URETEROSCOPY/HOLMIUM LASER/STENT PLACEMENT: SHX6546

## 2016-12-18 HISTORY — PX: CYSTOSCOPY W/ URETERAL STENT REMOVAL: SHX1430

## 2016-12-18 HISTORY — PX: HOLMIUM LASER APPLICATION: SHX5852

## 2016-12-18 LAB — POCT I-STAT 4, (NA,K, GLUC, HGB,HCT)
GLUCOSE: 127 mg/dL — AB (ref 65–99)
HEMATOCRIT: 38 % (ref 36.0–46.0)
HEMOGLOBIN: 12.9 g/dL (ref 12.0–15.0)
Potassium: 4.1 mmol/L (ref 3.5–5.1)
Sodium: 144 mmol/L (ref 135–145)

## 2016-12-18 LAB — GLUCOSE, CAPILLARY: GLUCOSE-CAPILLARY: 112 mg/dL — AB (ref 65–99)

## 2016-12-18 SURGERY — CYSTOSCOPY/URETEROSCOPY/HOLMIUM LASER/STENT PLACEMENT
Anesthesia: General | Site: Ureter | Laterality: Right

## 2016-12-18 MED ORDER — HYDROCODONE-ACETAMINOPHEN 5-325 MG PO TABS: 1.0000 | ORAL_TABLET | ORAL | 0 refills | Status: DC | PRN

## 2016-12-18 MED ORDER — FENTANYL CITRATE (PF) 100 MCG/2ML IJ SOLN
INTRAMUSCULAR | Status: DC | PRN
  Administered 2016-12-18: 25 ug via INTRAVENOUS
  Administered 2016-12-18: 50 ug via INTRAVENOUS

## 2016-12-18 MED ORDER — ONDANSETRON HCL 4 MG/2ML IJ SOLN
INTRAMUSCULAR | Status: AC
  Filled 2016-12-18: qty 2

## 2016-12-18 MED ORDER — ONDANSETRON HCL 4 MG/2ML IJ SOLN
INTRAMUSCULAR | Status: DC | PRN
  Administered 2016-12-18: 4 mg via INTRAVENOUS

## 2016-12-18 MED ORDER — PROPOFOL 10 MG/ML IV BOLUS
INTRAVENOUS | Status: AC
  Filled 2016-12-18: qty 20

## 2016-12-18 MED ORDER — PHENYLEPHRINE 40 MCG/ML (10ML) SYRINGE FOR IV PUSH (FOR BLOOD PRESSURE SUPPORT)
PREFILLED_SYRINGE | INTRAVENOUS | Status: AC
  Filled 2016-12-18: qty 10

## 2016-12-18 MED ORDER — CEFAZOLIN SODIUM-DEXTROSE 2-4 GM/100ML-% IV SOLN
INTRAVENOUS | Status: AC
  Filled 2016-12-18: qty 100

## 2016-12-18 MED ORDER — PROMETHAZINE HCL 25 MG/ML IJ SOLN
6.2500 mg | INTRAMUSCULAR | Status: DC | PRN
  Filled 2016-12-18: qty 1

## 2016-12-18 MED ORDER — MIDAZOLAM HCL 2 MG/2ML IJ SOLN
INTRAMUSCULAR | Status: AC
  Filled 2016-12-18: qty 2

## 2016-12-18 MED ORDER — CEFAZOLIN SODIUM-DEXTROSE 2-4 GM/100ML-% IV SOLN
2.0000 g | INTRAVENOUS | Status: AC
  Administered 2016-12-18: 2 g via INTRAVENOUS
  Filled 2016-12-18: qty 100

## 2016-12-18 MED ORDER — CEPHALEXIN 500 MG PO CAPS: 500.0000 mg | ORAL_CAPSULE | Freq: Three times a day (TID) | ORAL | 0 refills | Status: AC

## 2016-12-18 MED ORDER — LIDOCAINE 2% (20 MG/ML) 5 ML SYRINGE
INTRAMUSCULAR | Status: AC
  Filled 2016-12-18: qty 5

## 2016-12-18 MED ORDER — LACTATED RINGERS IV SOLN
INTRAVENOUS | Status: DC
  Administered 2016-12-18: 13:00:00 via INTRAVENOUS
  Filled 2016-12-18: qty 1000

## 2016-12-18 MED ORDER — IOHEXOL 300 MG/ML  SOLN
INTRAMUSCULAR | Status: DC | PRN
  Administered 2016-12-18: 30 mL

## 2016-12-18 MED ORDER — DEXAMETHASONE SODIUM PHOSPHATE 10 MG/ML IJ SOLN
INTRAMUSCULAR | Status: AC
  Filled 2016-12-18: qty 1

## 2016-12-18 MED ORDER — MIDAZOLAM HCL 5 MG/5ML IJ SOLN
INTRAMUSCULAR | Status: DC | PRN
  Administered 2016-12-18: 2 mg via INTRAVENOUS

## 2016-12-18 MED ORDER — LIDOCAINE 2% (20 MG/ML) 5 ML SYRINGE
INTRAMUSCULAR | Status: DC | PRN
  Administered 2016-12-18: 100 mg via INTRAVENOUS

## 2016-12-18 MED ORDER — PHENYLEPHRINE 40 MCG/ML (10ML) SYRINGE FOR IV PUSH (FOR BLOOD PRESSURE SUPPORT)
PREFILLED_SYRINGE | INTRAVENOUS | Status: DC | PRN
  Administered 2016-12-18 (×2): 80 ug via INTRAVENOUS

## 2016-12-18 MED ORDER — PROPOFOL 10 MG/ML IV BOLUS
INTRAVENOUS | Status: DC | PRN
  Administered 2016-12-18: 50 mg via INTRAVENOUS
  Administered 2016-12-18: 200 mg via INTRAVENOUS

## 2016-12-18 MED ORDER — KETOROLAC TROMETHAMINE 30 MG/ML IJ SOLN
INTRAMUSCULAR | Status: AC
  Filled 2016-12-18: qty 1

## 2016-12-18 MED ORDER — FENTANYL CITRATE (PF) 100 MCG/2ML IJ SOLN
INTRAMUSCULAR | Status: AC
  Filled 2016-12-18: qty 2

## 2016-12-18 MED ORDER — SODIUM CHLORIDE 0.9 % IR SOLN
Status: DC | PRN
  Administered 2016-12-18: 4000 mL

## 2016-12-18 MED ORDER — HYDROMORPHONE HCL 1 MG/ML IJ SOLN
0.2500 mg | INTRAMUSCULAR | Status: DC | PRN
  Filled 2016-12-18: qty 0.5

## 2016-12-18 MED ORDER — DEXAMETHASONE SODIUM PHOSPHATE 10 MG/ML IJ SOLN
INTRAMUSCULAR | Status: DC | PRN
  Administered 2016-12-18: 10 mg via INTRAVENOUS

## 2016-12-18 SURGICAL SUPPLY — 32 items
BAG DRAIN URO-CYSTO SKYTR STRL (DRAIN) ×5 IMPLANT
BASKET ZERO TIP NITINOL 2.4FR (BASKET) IMPLANT
CATH URET 5FR 28IN OPEN ENDED (CATHETERS) ×5 IMPLANT
CATH URET DUAL LUMEN 6-10FR 50 (CATHETERS) ×5 IMPLANT
CLOTH BEACON ORANGE TIMEOUT ST (SAFETY) ×5 IMPLANT
FIBER LASER FLEXIVA 365 (UROLOGICAL SUPPLIES) IMPLANT
FIBER LASER TRAC TIP (UROLOGICAL SUPPLIES) ×5 IMPLANT
GLOVE BIO SURGEON STRL SZ 6.5 (GLOVE) ×4 IMPLANT
GLOVE BIO SURGEON STRL SZ7.5 (GLOVE) ×5 IMPLANT
GLOVE BIO SURGEONS STRL SZ 6.5 (GLOVE) ×1
GLOVE BIOGEL PI IND STRL 6.5 (GLOVE) ×3 IMPLANT
GLOVE BIOGEL PI INDICATOR 6.5 (GLOVE) ×2
GOWN STRL REUS W/ TWL XL LVL3 (GOWN DISPOSABLE) IMPLANT
GOWN STRL REUS W/TWL LRG LVL3 (GOWN DISPOSABLE) ×10 IMPLANT
GOWN STRL REUS W/TWL XL LVL3 (GOWN DISPOSABLE)
GUIDEWIRE STR DUAL SENSOR (WIRE) ×10 IMPLANT
GUIDEWIRE SUPER STIFF (WIRE) ×10 IMPLANT
INFUSOR MANOMETER BAG 3000ML (MISCELLANEOUS) ×5 IMPLANT
IV NS 1000ML (IV SOLUTION) ×2
IV NS 1000ML BAXH (IV SOLUTION) ×3 IMPLANT
IV NS IRRIG 3000ML ARTHROMATIC (IV SOLUTION) ×5 IMPLANT
KIT RM TURNOVER CYSTO AR (KITS) ×5 IMPLANT
MANIFOLD NEPTUNE II (INSTRUMENTS) ×5 IMPLANT
NS IRRIG 500ML POUR BTL (IV SOLUTION) IMPLANT
PACK CYSTO (CUSTOM PROCEDURE TRAY) ×5 IMPLANT
SCRUB PCMX 4 OZ (MISCELLANEOUS) IMPLANT
SHEATH ACCESS URETERAL 38CM (SHEATH) ×5 IMPLANT
STENT URET 6FRX26 CONTOUR (STENTS) ×10 IMPLANT
SYRINGE IRR TOOMEY STRL 70CC (SYRINGE) IMPLANT
TUBE CONNECTING 12'X1/4 (SUCTIONS) ×1
TUBE CONNECTING 12X1/4 (SUCTIONS) ×4 IMPLANT
WATER STERILE IRR 500ML POUR (IV SOLUTION) ×5 IMPLANT

## 2016-12-18 NOTE — Anesthesia Procedure Notes (Signed)
Procedure Name: LMA Insertion Date/Time: 12/18/2016 1:03 PM Performed by: Bethena Roys T Pre-anesthesia Checklist: Patient identified, Emergency Drugs available, Suction available and Patient being monitored Patient Re-evaluated:Patient Re-evaluated prior to induction Oxygen Delivery Method: Circle System Utilized Preoxygenation: Pre-oxygenation with 100% oxygen Induction Type: IV induction Ventilation: Mask ventilation without difficulty LMA: LMA with gastric port inserted LMA Size: 5.0 Number of attempts: 1 Placement Confirmation: positive ETCO2 Tube secured with: Tape Dental Injury: Teeth and Oropharynx as per pre-operative assessment

## 2016-12-18 NOTE — Op Note (Signed)
Date of procedure: 12/18/16  Preoperative diagnosis:  1. Bilateral nephrolithiasis   Postoperative diagnosis:  1. same   Procedure: 1. Cystoscopy 2. Bilateral ureteroscopy 3. Laser lithotripsy 4. Bilateral retrograde pyelogram with interpretation 5. Right ureteral stent exchange 6 [place] by 26 cm 6. Left ureteral stent placement 6 French by 26 cm  Surgeon: Baruch Gouty, MD  Anesthesia: General  Complications: None  Intraoperative findings: in the right kidney, there wasmultiple clusters of 3-4 mm fragments. Due to the patient's soft uric acid based stones, these were easily dust and fragments less than 1 mm. Dusted fragments did flow out through the ureteral access sheath showing good stone breakup. Right retrograde pyelogram was unremarkable after the stone was broken up. The left side had only had multiple small fragments less than 1 mm. Many of these washed out through the ureteral access sheath during manipulation from ureteroscopy. Left retrograde pyelogram attempt and the procedure was unremarkable.  EBL: None  Specimens: none  Drains: bilateral 6 [place] by 26 cm double-J ureteral stents  Disposition: Stable to the postanesthesia care unit  Indication for procedure: The patient is a 61 y.o. female with history of nephrolithiasis who recently underwent a right percutaneous nephrolithotomy. Her stone was mostly uric acid based other was some calcium oxylate component. The stone was very soft at the time of her PCNL. She also has stone burden on the left lower pole but is either one large stone or lasering of multiple small stones. She presents today for definitive surgical management bilaterally..  After reviewing the management options for treatment, the patient elected to proceed with the above surgical procedure(s). We have discussed the potential benefits and risks of the procedure, side effects of the proposed treatment, the likelihood of the patient achieving the goals of  the procedure, and any potential problems that might occur during the procedure or recuperation. Informed consent has been obtained.  Description of procedure: The patient was met in the preoperative area. All risks, benefits, and indications of the procedure were described in great detail. The patient consented to the procedure. Preoperative antibiotics were given. The patient was taken to the operative theater. General anesthesia was induced per the anesthesia service. The patient was then placed in the dorsal lithotomy position and prepped and draped in the usual sterile fashion. A preoperative timeout was called.   A 21 French 30 cystoscope was inserted the patient's bladder per urethra atraumatically. The previously placed right ureteral stent was grasped and removed per urethra intact. His too calcified to place the sensor wire in. 2 sensor wire was then advanced level of the right renal pelvis under fluoroscopy. This was performed after obtaining a right retrograde program to ensure no filling defects in the ureter which there were none.Over the sensor wires a ureteral access sheath was placed under fluoroscopy atraumatically. The inner sheath and the guiding sensor wire removed. Pan nephroscopy revealed stones layering in the upper and lower poles. The stones were at the large size of prostate 3-4 mm. There was a large number of them. Due to the known softener to the stone, decision was made to destination the small fragments. This took place for approximately 20 minutes. During this dusted stone did flow easily out of the access sheath. At this point, there are no fragments larger than 2 mm and number passed through the access sheath during nephroscopy. At this point, there were no stones large enough for grasping with stone basket. Retrograde pyelogram was obtained to identify the collecting system.  The right ureteral access sheath was removed under direct localization showing no stones or trauma to  the right ureter.  With the aid of the cystoscope, the 6 [place] by 26 over double-J ureter stent was then placed in the right side over the remaining sensor wire. The sensor wire was removed. A curl was in the patient's renal pelvis under fluoroscopy and in the urinary bladder under direct visualization. At this point attention was turned to the left ureteral orifice and 2 sensor wires were placed the level left renal pelvis. Access sheath was placed identical fashion as the contralateral side. Pan nephroscopy in the left revealed a wearing of stones that were in the left lower pole. Manipulation from irrigations the nephroscope to the stones around the renal pelvis. Many of them passed through the access sheath. They're all no larger than 1 mm. At this point, decision small size no further intervention was carried out. Left retrograde pyelogram was used to identify the collecting system and showed no filling defects. There were no other large stones within the left kidney or ureter. The left ureteral access sheath and removed under direct visualization showing no trauma or ureteral stones. A 6 French by 26 cm left double-J ureteral stent was then placed in medical fashion of the contralateral side. This confirmed to be correct location with direct visualization in the bladder and in the left renal pelvis on fluoroscopy. At this point, the patient's bladder was drained and she was woke from anesthesia and transferred in stable condition to the postanesthesia care unit.  Plan: The patient will follow-up for bilateral ureteral stent removal in one week. She'll need a renal ultrasound 1 month) iatrogenic hydronephrosis. She would benefit from urinary alkalinization due to her uric acid stones. Will discuss this further with the patient at follow up.   Baruch Gouty, M.D.

## 2016-12-18 NOTE — Discharge Instructions (Signed)
Post Anesthesia Home Care Instructions  Activity: Get plenty of rest for the remainder of the day. A responsible individual must stay with you for 24 hours following the procedure.  For the next 24 hours, DO NOT: -Drive a car -Operate machinery -Drink alcoholic beverages -Take any medication unless instructed by your physician -Make any legal decisions or sign important papers.  Meals: Start with liquid foods such as gelatin or soup. Progress to regular foods as tolerated. Avoid greasy, spicy, heavy foods. If nausea and/or vomiting occur, drink only clear liquids until the nausea and/or vomiting subsides. Call your physician if vomiting continues.  Special Instructions/Symptoms: Your throat may feel dry or sore from the anesthesia or the breathing tube placed in your throat during surgery. If this causes discomfort, gargle with warm salt water. The discomfort should disappear within 24 hours.  If you had a scopolamine patch placed behind your ear for the management of post- operative nausea and/or vomiting:  1. The medication in the patch is effective for 72 hours, after which it should be removed.  Wrap patch in a tissue and discard in the trash. Wash hands thoroughly with soap and water. 2. You may remove the patch earlier than 72 hours if you experience unpleasant side effects which may include dry mouth, dizziness or visual disturbances. 3. Avoid touching the patch. Wash your hands with soap and water after contact with the patch.      [facility] [phone] Post Ureteroscopy With or Without Stent Instructions  Definitions:  Ureter: The duct that transports urine from the kidney to the bladder. Stent:   A plastic hollow tube that is placed into the ureter, from the kidney to the bladder to prevent the ureter from swelling shut.  GENERAL INSTRUCTIONS:  Despite the fact that no skin incisions were used, the area around the ureter and bladder is raw and  irritated. The stent is a foreign body which will further irritate the bladder wall. This irritation is manifested by increased frequency of urination, both day and night, and by an increase in the urge to urinate. In some, the urge to urinate is present almost always. Sometimes the urge is strong enough that you may not be able to stop yourself from urinating. The only real cure is to remove the stent and then give time for the bladder wall to heal which can't be done until the danger of the ureter swelling shut has passed, which varies.  You may see some blood in your urine while the stent is in place and a few days afterwards. Do not be alarmed, even if the urine was clear for a while. Get off your feet and drink lots of fluids until clearing occurs. If you start to pass clots or don't improve, call us.  DIET: You may return to your normal diet immediately. Because of the raw surface of your bladder, alcohol, spicy foods, acid type foods and drinks with caffeine may cause irritation or frequency and should be used in moderation. To keep your urine flowing freely and to avoid constipation, drink plenty of fluids during the day ( 8-10 glasses ). Tip: Avoid cranberry juice because it is very acidic.  ACTIVITY: Your physical activity doesn't need to be restricted. However, if you are very active, you may see some blood in your urine. We suggest that you reduce your activity under these circumstances until the bleeding has stopped.  BOWELS: It is important to keep your bowels regular during the postoperative period. Straining   with bowel movements can cause bleeding. A bowel movement every other day is reasonable. Use a mild laxative if needed, such as Milk of Magnesia 2-3 tablespoons, or 2 Dulcolax tablets. Call if you continue to have problems. If you have been taking narcotics for pain, before, during or after your surgery, you may be constipated. Take a laxative if necessary.   MEDICATION: You  should resume your pre-surgery medications unless told not to. In addition you will often be given an antibiotic to prevent infection. These should be taken as prescribed until the bottles are finished unless you are having an unusual reaction to one of the drugs.  PROBLEMS YOU SHOULD REPORT TO US: Fevers over 100.5 Fahrenheit. Heavy bleeding, or clots ( See above notes about blood in urine ). Inability to urinate. Drug reactions ( hives, rash, nausea, vomiting, diarrhea ). Severe burning or pain with urination that is not improving.  FOLLOW-UP: You will need a follow-up appointment to monitor your progress. Call for this appointment at the number listed above. Usually the first appointment will be about three to fourteen days after your surgery.      

## 2016-12-18 NOTE — Anesthesia Preprocedure Evaluation (Signed)
Anesthesia Evaluation  Patient identified by MRN, date of birth, ID band Patient awake    Reviewed: Allergy & Precautions, NPO status , Patient's Chart, lab work & pertinent test results  Airway Mallampati: III  TM Distance: <3 FB Neck ROM: Full    Dental  (+) Teeth Intact, Dental Advisory Given   Pulmonary asthma , PE   Pulmonary exam normal breath sounds clear to auscultation       Cardiovascular hypertension, Pt. on medications (-) angina(-) CAD, (-) Past MI and (-) CHF Normal cardiovascular exam Rhythm:Regular Rate:Normal     Neuro/Psych PSYCHIATRIC DISORDERS Anxiety Depression negative neurological ROS     GI/Hepatic Neg liver ROS, GERD  ,  Endo/Other  diabetes, Type 2, Oral Hypoglycemic AgentsHypothyroidism Obesity   Renal/GU Renal InsufficiencyRenal disease     Musculoskeletal  (+) Arthritis , Osteoarthritis,    Abdominal   Peds  Hematology  (+) Blood dyscrasia (Warfarin), anemia ,   Anesthesia Other Findings Day of surgery medications reviewed with the patient.  Reproductive/Obstetrics                             Anesthesia Physical  Anesthesia Plan  ASA: III  Anesthesia Plan: General   Post-op Pain Management:    Induction: Intravenous  PONV Risk Score and Plan: 3 and Ondansetron, Dexamethasone and Midazolam  Airway Management Planned: Oral ETT  Additional Equipment:   Intra-op Plan:   Post-operative Plan: Extubation in OR  Informed Consent: I have reviewed the patients History and Physical, chart, labs and discussed the procedure including the risks, benefits and alternatives for the proposed anesthesia with the patient or authorized representative who has indicated his/her understanding and acceptance.   Dental advisory given  Plan Discussed with: CRNA  Anesthesia Plan Comments: (Risks/benefits of general anesthesia discussed with patient including risk of  damage to teeth, lips, gum, and tongue, nausea/vomiting, allergic reactions to medications, and the possibility of heart attack, stroke and death.  All patient questions answered.  Patient wishes to proceed.)        Anesthesia Quick Evaluation

## 2016-12-18 NOTE — Transfer of Care (Signed)
Immediate Anesthesia Transfer of Care Note  Patient: Allison [name]  Procedure(s) Performed: Procedure(s): CYSTOSCOPY/URETEROSCOPY/HOLMIUM LASER/    BILATERAL STENT PLACEMENT (Bilateral) CYSTOSCOPY WITH RETROGRADE PYELOGRAM (Bilateral) CYSTOSCOPY WITH STENT REMOVAL (Right) HOLMIUM LASER APPLICATION (Bilateral)  Patient Location: PACU  Anesthesia Type:General  Level of Consciousness: awake and oriented  Airway & Oxygen Therapy: Patient Spontanous Breathing and Patient connected to nasal cannula oxygen  Post-op Assessment: Report given to RN  Post vital signs: Reviewed and stable  Last Vitals: 112/80, 106, 12, 99%, 98.8 Vitals:   12/18/16 1135  BP: 114/62  Pulse: 98  Resp: 20  Temp: 37.1 C  SpO2: 100%    Last Pain:  Vitals:   12/18/16 1135  TempSrc: Oral      Patients Stated Pain Goal: 5 (76/81/15 7262)  Complications: No apparent anesthesia complications

## 2016-12-18 NOTE — H&P (Signed)
CC: I have kidney stones.  HPI: Allison [name] is a 61 year-old female established patient who is here for renal calculi.  The problem is on the right side.   Patient with recent PCNL on the right side for large mainly uric acid stone who presents for follow-up. She does have known residual small fragments in her right kidney on postoperative CT appears to understand place. She also has a 1 cm stone in the left kidney.     ALLERGIES: No Allergies Ultram    MEDICATIONS: Levothyroxine Sodium 125 mcg tablet  Warfarin Sodium 4 mg tablet  ALPRAZolam 0.25 MG Oral Tablet Oral  GlyBURIDE 5 MG Oral Tablet Oral  Lisinopril-Hydrochlorothiazide 20-12.5 MG Oral Tablet Oral  MetFORMIN HCl - 1000 MG Oral Tablet Oral  Pravastatin Sodium 40 MG Oral Tablet Oral  Singulair 10 MG Oral Tablet Oral  Verapamil HCl ER 120 MG Oral Tablet Extended Release Oral  Zyrtec 10 MG TABS Oral     GU PSH: Cystoscopy Insert Stent, Right - 04/17/2016 Cystoscopy Ureteroscopy, Right - 04/17/2016 Percut Stone Removal >2cm - 11/20/2016      PSH Notes: Cesarean Section   NON-GU PSH: Cesarean Delivery Only - 2010 Hysterectomy Thyroid Surgery    GU PMH: Renal calculus - 07/31/2016, - 02/20/2016 Ureteral calculus - 02/20/2016, Calculus of ureter, - 2014 Abdominal Pain Unspec, Abdominal pain - 2014 Acute Cystitis/UTI, Acute cystitis without hematuria - 2014 Gross hematuria, Gross hematuria - 2014 History of urolithiasis, Nephrolithiasis - 2014 Stress Incontinence, Female stress incontinence - 2014      PMH Notes:  1898-04-02 00:00:00 - Note: Normal Routine History And Physical Adult  2008-08-06 15:13:45 - Note: Arthritis   NON-GU PMH: Anxiety, Anxiety - 2014 Asthma, Asthma - 2014 Personal history of other diseases of the circulatory system, History of hypertension - 2014 Personal history of other endocrine, nutritional and metabolic disease, History of diabetes mellitus - 2014, History of hypercholesterolemia, -  2014 Arthritis Depression Diabetes Type 2 GERD Hypercholesterolemia Hypertension    FAMILY HISTORY: 1 Daughter - Runs in Family 1 son - Son adopted - No Family History Family Health Status - Mother's Age - Runs In Family Family Health Status Number - Runs In Family Father Deceased At [employer] ___ - Runs In Family   SOCIAL HISTORY: Marital Status: Married Preferred Language: English; Ethnicity: Not Hispanic Or Latino; Race: White Current Smoking Status: Patient has never smoked.  Has never drank.  Does not drink caffeine. 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     Notes: Marital History - Currently Married, Tobacco Use, Alcohol Use, Occupation:, Caffeine Use   REVIEW OF SYSTEMS:    GU Review Female:   Patient reports frequent urination, burning /pain with urination, and get up at night to urinate. Patient denies hard to postpone urination, leakage of urine, stream starts and stops, trouble starting your stream, have to strain to urinate, and being pregnant.  Gastrointestinal (Upper):   Patient denies nausea, vomiting, and indigestion/ heartburn.  Gastrointestinal (Lower):   Patient denies diarrhea and constipation.  Constitutional:   Patient denies fever, night sweats, weight loss, and fatigue.  Skin:   Patient denies skin rash/ lesion and itching.  Eyes:   Patient denies blurred vision and double vision.  Ears/ Nose/ Throat:   Patient denies sore throat and sinus problems.  Hematologic/Lymphatic:   Patient denies swollen glands and easy bruising.  Cardiovascular:   Patient denies leg swelling and chest pains.  Respiratory:   Patient denies cough and shortness of breath.  Endocrine:   Patient denies excessive thirst.  Musculoskeletal:   Patient denies back pain and joint pain.  Neurological:   Patient denies headaches and dizziness.  Psychologic:   Patient denies depression and anxiety.   VITAL SIGNS:      12/04/2016 12:00 PM  BP 135/66 mmHg  Pulse 114 /min  Temperature  98.6 F / 37 C   MULTI-SYSTEM PHYSICAL EXAMINATION:    Constitutional: Well-nourished. No physical deformities. Normally developed. Good grooming.  Neck: Neck symmetrical, not swollen. Normal tracheal position.  Respiratory: No labored breathing, no use of accessory muscles.   Cardiovascular: Normal temperature, normal extremity pulses, no swelling, no varicosities.  Skin: No paleness, no jaundice, no cyanosis. No lesion, no ulcer, no rash.  Gastrointestinal: No mass, no tenderness, no rigidity, non obese abdomen.  Eyes: Normal conjunctivae. Normal eyelids.  Ears, Nose, Mouth, and Throat: Left ear no scars, no lesions, no masses. Right ear no scars, no lesions, no masses. Nose no scars, no lesions, no masses. Normal hearing. Normal lips.  Musculoskeletal: Normal gait and station of head and neck.     PAST DATA REVIEWED:  Source Of History:  Patient   PROCEDURES:          Urinalysis w/Scope - 81001 Dipstick Dipstick Cont'd Micro  Color: Yellow Bilirubin: Neg WBC/hpf: 10 - 20/hpf  Appearance: Cloudy Ketones: Trace RBC/hpf: >60/hpf  Specific Gravity: 1.020 Blood: 3+ Bacteria: NS (Not Seen)  pH: <=5.0 Protein: 3+ Cystals: Amorph Urates  Glucose: Neg Urobilinogen: 0.2 Casts: NS (Not Seen)    Nitrites: Neg Trichomonas: Not Present    Leukocyte Esterase: 1+ Mucous: Not Present      Epithelial Cells: 0 - 5/hpf      Yeast: NS (Not Seen)      Sperm: Not Present    Notes:      ASSESSMENT:      ICD-10 Details  1 GU:   Renal calculus - N20.0    PLAN:           Document Letter(s):  Created for Elvera Maria, MD   Created for Patient: Clinical Summary         Notes:   I discussed with patient that she does have residual stone burden on the right side as well as a stone on the left side. We discussed undergoing cystoscopy, bilateral ureteroscopy, laser lithotripsy, right ureteral stent exchange, left ureteral stone. Discussed the risks and benefits of this. She is agreeable to  proceeding. We'll plan for doing this in a few weeks when she is fully healed from surgery.   I did also briefly discussed that she does have uric acid stones and current urine pH of less than 5. She likely will benefit from potassium citrate therapy in the future as well as a 24-hour urines.

## 2016-12-19 ENCOUNTER — Encounter (HOSPITAL_BASED_OUTPATIENT_CLINIC_OR_DEPARTMENT_OTHER): Payer: Self-pay | Admitting: Urology

## 2016-12-19 NOTE — Anesthesia Postprocedure Evaluation (Signed)
Anesthesia Post Note  Patient: YUDIT MODESITT  Procedure(s) Performed: Procedure(s) (LRB): CYSTOSCOPY/URETEROSCOPY/HOLMIUM LASER/    BILATERAL STENT PLACEMENT (Bilateral) CYSTOSCOPY WITH RETROGRADE PYELOGRAM (Bilateral) CYSTOSCOPY WITH STENT REMOVAL (Right) HOLMIUM LASER APPLICATION (Bilateral)     Patient location during evaluation: PACU Anesthesia Type: General Level of consciousness: awake and alert Pain management: pain level controlled Vital Signs Assessment: post-procedure vital signs reviewed and stable Respiratory status: spontaneous breathing, nonlabored ventilation and respiratory function stable Cardiovascular status: blood pressure returned to baseline and stable Postop Assessment: no apparent nausea or vomiting Anesthetic complications: no    Last Vitals:  Vitals:   12/18/16 1430 12/18/16 1600  BP: (!) 114/59 119/63  Pulse: 87 83  Resp: 18 16  Temp:  37.2 C  SpO2: 97% 95%    Last Pain:  Vitals:   12/18/16 1135  TempSrc: Oral                 Lynda Rainwater

## 2017-02-15 ENCOUNTER — Other Ambulatory Visit: Payer: Self-pay | Admitting: Family Medicine

## 2017-03-06 ENCOUNTER — Other Ambulatory Visit: Payer: Self-pay | Admitting: Family Medicine

## 2017-03-06 DIAGNOSIS — E119 Type 2 diabetes mellitus without complications: Secondary | ICD-10-CM

## 2017-03-13 ENCOUNTER — Telehealth: Payer: Self-pay | Admitting: Family Medicine

## 2017-03-13 ENCOUNTER — Other Ambulatory Visit: Payer: Self-pay | Admitting: Family Medicine

## 2017-03-13 DIAGNOSIS — I10 Essential (primary) hypertension: Secondary | ICD-10-CM

## 2017-03-13 DIAGNOSIS — E119 Type 2 diabetes mellitus without complications: Secondary | ICD-10-CM

## 2017-03-13 MED ORDER — METFORMIN HCL 1000 MG PO TABS: 1000.0000 mg | ORAL_TABLET | Freq: Two times a day (BID) | ORAL | 0 refills | Status: DC

## 2017-03-13 NOTE — Telephone Encounter (Signed)
Pt needs refill on her metformin sent to [facility]. She is completely out and needs some to last her until her 12/17 appt. Please advise

## 2017-03-13 NOTE — Telephone Encounter (Signed)
rx sent to pharmacy

## 2017-03-18 ENCOUNTER — Other Ambulatory Visit: Payer: Self-pay

## 2017-03-18 ENCOUNTER — Ambulatory Visit (INDEPENDENT_AMBULATORY_CARE_PROVIDER_SITE_OTHER): Payer: BLUE CROSS/BLUE SHIELD | Admitting: Family Medicine

## 2017-03-18 ENCOUNTER — Encounter: Payer: Self-pay | Admitting: Family Medicine

## 2017-03-18 VITALS — BP 130/78 | HR 115 | Temp 98.7°F | Ht 66.0 in | Wt 255.0 lb

## 2017-03-18 DIAGNOSIS — I2782 Chronic pulmonary embolism: Secondary | ICD-10-CM

## 2017-03-18 DIAGNOSIS — Z1231 Encounter for screening mammogram for malignant neoplasm of breast: Secondary | ICD-10-CM

## 2017-03-18 DIAGNOSIS — Z1239 Encounter for other screening for malignant neoplasm of breast: Secondary | ICD-10-CM

## 2017-03-18 DIAGNOSIS — E039 Hypothyroidism, unspecified: Secondary | ICD-10-CM

## 2017-03-18 DIAGNOSIS — E119 Type 2 diabetes mellitus without complications: Secondary | ICD-10-CM

## 2017-03-18 DIAGNOSIS — I10 Essential (primary) hypertension: Secondary | ICD-10-CM

## 2017-03-18 DIAGNOSIS — E118 Type 2 diabetes mellitus with unspecified complications: Secondary | ICD-10-CM | POA: Diagnosis not present

## 2017-03-18 DIAGNOSIS — F411 Generalized anxiety disorder: Secondary | ICD-10-CM

## 2017-03-18 LAB — POCT GLYCOSYLATED HEMOGLOBIN (HGB A1C): HEMOGLOBIN A1C: 7

## 2017-03-18 MED ORDER — ALBUTEROL SULFATE HFA 108 (90 BASE) MCG/ACT IN AERS: INHALATION_SPRAY | RESPIRATORY_TRACT | 1 refills | Status: DC

## 2017-03-18 MED ORDER — GLYBURIDE 5 MG PO TABS: 5.0000 mg | ORAL_TABLET | Freq: Two times a day (BID) | ORAL | 3 refills | Status: DC

## 2017-03-18 MED ORDER — LISINOPRIL-HYDROCHLOROTHIAZIDE 20-12.5 MG PO TABS: 1.0000 | ORAL_TABLET | Freq: Every day | ORAL | 1 refills | Status: DC

## 2017-03-18 MED ORDER — METFORMIN HCL 1000 MG PO TABS: 1000.0000 mg | ORAL_TABLET | Freq: Two times a day (BID) | ORAL | 0 refills | Status: DC

## 2017-03-18 MED ORDER — PRAVASTATIN SODIUM 40 MG PO TABS: 40.0000 mg | ORAL_TABLET | Freq: Every day | ORAL | 1 refills | Status: DC

## 2017-03-18 MED ORDER — MONTELUKAST SODIUM 10 MG PO TABS: 10.0000 mg | ORAL_TABLET | Freq: Every day | ORAL | 3 refills | Status: DC

## 2017-03-18 MED ORDER — FLUOXETINE HCL 20 MG PO TABS: 20.0000 mg | ORAL_TABLET | Freq: Every day | ORAL | 0 refills | Status: DC

## 2017-03-18 MED ORDER — LEVOTHYROXINE SODIUM 125 MCG PO TABS: 125.0000 ug | ORAL_TABLET | Freq: Every day | ORAL | 3 refills | Status: DC

## 2017-03-18 MED ORDER — ALPRAZOLAM 0.5 MG PO TABS: 0.5000 mg | ORAL_TABLET | Freq: Three times a day (TID) | ORAL | 0 refills | Status: DC | PRN
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
## 2017-03-18 NOTE — Assessment & Plan Note (Signed)
History of pulmonary embolism in 2013, apparently bilateral. Iinitially thought to be idiopathic. 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 She is now status post hysterectomy. Patient previously on Coumadin  for about 5 years and was recently stopped 3 months ago for 1 of her physicians.  She has been doing well off of this medication.  Will not resume anticoagulation as it does not appear indicated at this time.

## 2017-03-18 NOTE — Patient Instructions (Addendum)
Thank you for coming in today, it was so nice to see you! Today we talked about:    I have refilled all of your medications today  We have started you on an anti anxiety medication called Prozac. I have attached some information on this below. Only take the Xanax when you absolutely need to. The goal is to wean off this medication due to side effects.   Come in one morning this week to get blood work   I have placed a referral for a mammogram, someone will call you to schedule this  Please follow up in 1 month for a complete physical exam. You can schedule this appointment at the front desk before you leave or call the clinic.  Bring in all your medications or supplements to each appointment for review.   If we ordered any tests today, you will be notified via telephone of any abnormalities. If everything is normal you will get a letter in the mail.   If you have any questions or concerns, please do not hesitate to call the office at [phone]. You can also message me directly via MyChart.   Sincerely,  Smitty Cords, MD
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
## 2017-03-22 ENCOUNTER — Other Ambulatory Visit: Payer: BLUE CROSS/BLUE SHIELD

## 2017-03-22 DIAGNOSIS — E039 Hypothyroidism, unspecified: Secondary | ICD-10-CM

## 2017-03-22 DIAGNOSIS — I10 Essential (primary) hypertension: Secondary | ICD-10-CM

## 2017-03-22 DIAGNOSIS — E118 Type 2 diabetes mellitus with unspecified complications: Secondary | ICD-10-CM

## 2017-03-23 ENCOUNTER — Telehealth: Payer: Self-pay | Admitting: Internal Medicine

## 2017-03-23 LAB — COMPREHENSIVE METABOLIC PANEL
A/G RATIO: 1.6 (ref 1.2–2.2)
ALBUMIN: 4.4 g/dL (ref 3.6–4.8)
ALK PHOS: 64 IU/L (ref 39–117)
ALT: 8 IU/L (ref 0–32)
AST: 14 IU/L (ref 0–40)
BUN / CREAT RATIO: 14 (ref 12–28)
BUN: 16 mg/dL (ref 8–27)
Bilirubin Total: 0.5 mg/dL (ref 0.0–1.2)
CO2: 25 mmol/L (ref 20–29)
CREATININE: 1.18 mg/dL — AB (ref 0.57–1.00)
Calcium: 6.7 mg/dL — CL (ref 8.7–10.3)
Chloride: 101 mmol/L (ref 96–106)
GFR calc Af Amer: 58 mL/min/{1.73_m2} — ABNORMAL LOW (ref >59)
GFR calc non Af Amer: 50 mL/min/{1.73_m2} — ABNORMAL LOW (ref >59)
GLOBULIN, TOTAL: 2.8 g/dL (ref 1.5–4.5)
Glucose: 123 mg/dL — ABNORMAL HIGH (ref 65–99)
Potassium: 4.5 mmol/L (ref 3.5–5.2)
SODIUM: 146 mmol/L — AB (ref 134–144)
Total Protein: 7.2 g/dL (ref 6.0–8.5)

## 2017-03-23 LAB — CBC WITH DIFFERENTIAL/PLATELET
BASOS: 0 %
Basophils Absolute: 0 10*3/uL (ref 0.0–0.2)
EOS (ABSOLUTE): 0.2 10*3/uL (ref 0.0–0.4)
EOS: 3 %
HEMATOCRIT: 39.1 % (ref 34.0–46.6)
Hemoglobin: 12.3 g/dL (ref 11.1–15.9)
Immature Grans (Abs): 0 10*3/uL (ref 0.0–0.1)
Immature Granulocytes: 0 %
LYMPHS ABS: 2.1 10*3/uL (ref 0.7–3.1)
Lymphs: 39 %
MCH: 28.2 pg (ref 26.6–33.0)
MCHC: 31.5 g/dL (ref 31.5–35.7)
MCV: 90 fL (ref 79–97)
MONOS ABS: 0.4 10*3/uL (ref 0.1–0.9)
Monocytes: 6 %
Neutrophils Absolute: 2.8 10*3/uL (ref 1.4–7.0)
Neutrophils: 52 %
Platelets: 197 10*3/uL (ref 150–379)
RBC: 4.36 x10E6/uL (ref 3.77–5.28)
RDW: 14.6 % (ref 12.3–15.4)
WBC: 5.5 10*3/uL (ref 3.4–10.8)

## 2017-03-23 LAB — LIPID PANEL
CHOLESTEROL TOTAL: 162 mg/dL (ref 100–199)
Chol/HDL Ratio: 3.4 ratio (ref 0.0–4.4)
HDL: 48 mg/dL (ref >39)
LDL CALC: 88 mg/dL (ref 0–99)
TRIGLYCERIDES: 132 mg/dL (ref 0–149)
VLDL CHOLESTEROL CAL: 26 mg/dL (ref 5–40)

## 2017-03-23 LAB — TSH: TSH: 0.139 u[IU]/mL — ABNORMAL LOW (ref 0.450–4.500)

## 2017-03-23 NOTE — Telephone Encounter (Signed)
Received page to emergency after hours line from [place] about critical calcium level of 6.7 on labs drawn 12//21/18. Of note, patient has CKD and previous low calcium level of 6.3 on 11/21/16. Lab not drawn due to symptoms but as part of routine monitoring. To have annual exam next month. Consider referring to nephrology, performing further work-up with vitamin D level, PTH level.   Olene Floss, MD [place], PGY-3

## 2017-04-09 ENCOUNTER — Telehealth: Payer: Self-pay | Admitting: Family Medicine

## 2017-04-09 ENCOUNTER — Encounter: Payer: Self-pay | Admitting: Family Medicine

## 2017-04-09 NOTE — Telephone Encounter (Signed)
Called patient to discuss her blood work. Tried both numbers on file. No answer. Left voicemail for her to call back. Some lab abnormalities I would like to discuss:   1. Low Calcium. Calcium of 6.7. Albumin normal 2. Mildly elevated Cr to 1.18 3. Low TSH to 0.139  She will need to repeat her BMP. We will also need to decrease her synthroid dose and recheck TSH in 6 weeks, but would like to discuss this with her first.   Smitty Cords, MD [place], PGY-3

## 2017-04-09 NOTE — Telephone Encounter (Signed)
Patient returning call to PCP for lab results. Hubbard Hartshorn, RN, BSN

## 2017-04-10 MED ORDER — LEVOTHYROXINE SODIUM 100 MCG PO TABS: 100.0000 ug | ORAL_TABLET | Freq: Every day | ORAL | 3 refills | Status: DC

## 2017-04-10 NOTE — Telephone Encounter (Signed)
Pt called about her results

## 2017-04-10 NOTE — Telephone Encounter (Signed)
Returned call. Discussed lab results. She was agreeable to trying a lower dose of synthroid, discontinued 125 mcg of synthroid and started 18mcg daily. Will see her in 1 week.

## 2017-04-10 NOTE — Addendum Note (Signed)
Addended by: Carlyle Dolly on: 04/10/2017 05:48 PM   Modules accepted: Orders

## 2017-04-19 ENCOUNTER — Ambulatory Visit (INDEPENDENT_AMBULATORY_CARE_PROVIDER_SITE_OTHER): Payer: BLUE CROSS/BLUE SHIELD | Admitting: Family Medicine

## 2017-04-19 ENCOUNTER — Other Ambulatory Visit: Payer: Self-pay

## 2017-04-19 ENCOUNTER — Encounter: Payer: Self-pay | Admitting: Family Medicine

## 2017-04-19 VITALS — BP 110/62 | HR 104 | Temp 98.3°F | Ht 66.0 in | Wt 253.0 lb

## 2017-04-19 DIAGNOSIS — F411 Generalized anxiety disorder: Secondary | ICD-10-CM | POA: Diagnosis not present

## 2017-04-19 DIAGNOSIS — Z0000 Encounter for general adult medical examination without abnormal findings: Secondary | ICD-10-CM

## 2017-04-19 DIAGNOSIS — E119 Type 2 diabetes mellitus without complications: Secondary | ICD-10-CM | POA: Diagnosis not present

## 2017-04-19 DIAGNOSIS — N898 Other specified noninflammatory disorders of vagina: Secondary | ICD-10-CM | POA: Diagnosis not present

## 2017-04-19 DIAGNOSIS — E039 Hypothyroidism, unspecified: Secondary | ICD-10-CM

## 2017-04-19 DIAGNOSIS — R350 Frequency of micturition: Secondary | ICD-10-CM | POA: Diagnosis not present

## 2017-04-19 DIAGNOSIS — R7989 Other specified abnormal findings of blood chemistry: Secondary | ICD-10-CM

## 2017-04-19 MED ORDER — LEVOTHYROXINE SODIUM 100 MCG PO TABS: 100.0000 ug | ORAL_TABLET | Freq: Every day | ORAL | 0 refills | Status: DC

## 2017-04-19 MED ORDER — GLYBURIDE 5 MG PO TABS: 5.0000 mg | ORAL_TABLET | Freq: Two times a day (BID) | ORAL | 3 refills | Status: DC

## 2017-04-19 MED ORDER — FLUCONAZOLE 150 MG PO TABS: 150.0000 mg | ORAL_TABLET | Freq: Once | ORAL | 0 refills | Status: AC

## 2017-04-19 MED ORDER — LISINOPRIL-HYDROCHLOROTHIAZIDE 20-12.5 MG PO TABS: 1.0000 | ORAL_TABLET | Freq: Every day | ORAL | 1 refills | Status: DC

## 2017-04-19 MED ORDER — PRAVASTATIN SODIUM 40 MG PO TABS: 40.0000 mg | ORAL_TABLET | Freq: Every day | ORAL | 1 refills | Status: DC

## 2017-04-19 MED ORDER — METFORMIN HCL 1000 MG PO TABS: 1000.0000 mg | ORAL_TABLET | Freq: Two times a day (BID) | ORAL | 0 refills | Status: DC

## 2017-04-19 MED ORDER — ALBUTEROL SULFATE HFA 108 (90 BASE) MCG/ACT IN AERS: INHALATION_SPRAY | RESPIRATORY_TRACT | 1 refills | Status: DC

## 2017-04-19 MED ORDER — CETIRIZINE HCL 10 MG PO TABS: 10.0000 mg | ORAL_TABLET | Freq: Every day | ORAL | 1 refills | Status: DC

## 2017-04-19 NOTE — Patient Instructions (Signed)
Thank you for coming in today, it was so nice to see you! Today we talked about:    Annual physical: We have addressed health maintenance items. You will need your mammogram asap.   Possible yeast infection: I have sent you in diflucan. If your symptoms don't resolve then please come back   You calcium was a little low last time we checked. I'd like to check it again along with vitamin D and PTH  Please follow up in 3 months for your next A1C check. You can schedule this appointment at the front desk before you leave or call the clinic.  Bring in all your medications or supplements to each appointment for review.   If we ordered any tests today, you will be notified via telephone of any abnormalities. If everything is normal you will get a letter in the mail.   If you have any questions or concerns, please do not hesitate to call the office at [phone]. You can also message me directly via MyChart.   Sincerely,  Smitty Cords, MD

## 2017-04-19 NOTE — Assessment & Plan Note (Signed)
Continues to endorse anxiety.  She never started the Prozac that was prescribed on December 17.  she is resistant to starting any other medications for anxiety and depression.  She only wants to take Xanax as needed.  Declines behavioral Estate manager/land agent.  Discussed that Xanax is not a good long-term option for her symptoms and I strongly recommend behavioral therapy.  She states that she would consider it may be in the future.

## 2017-04-19 NOTE — Assessment & Plan Note (Signed)
Calcium was 6.7 when checked a couple weeks ago.  Could be related to parathyroid hormone abnormality versus vitamin D deficiency -Recheck calcium, PTH, vitamin D
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
## 2017-04-19 NOTE — Assessment & Plan Note (Signed)
TSH too low when last checked a few weeks ago.  Decreased her Synthroid dose 100 mcg daily.  She has not filled the prescription because she wants this prescription sent to another pharmacy for a cheaper price.  -Continue 125 mcg of Synthroid until she can pick up the 100 mcg of Synthroid -Recheck TSH in 6 weeks after she starts the 100 mcg of Synthroid

## 2017-04-20 LAB — PTH, INTACT AND CALCIUM
CALCIUM: 7 mg/dL — AB (ref 8.7–10.3)
PTH: 32 pg/mL (ref 15–65)

## 2017-04-20 LAB — VITAMIN D 25 HYDROXY (VIT D DEFICIENCY, FRACTURES): Vit D, 25-Hydroxy: 8.6 ng/mL — ABNORMAL LOW (ref 30.0–100.0)

## 2017-04-22 ENCOUNTER — Telehealth: Payer: Self-pay | Admitting: Family Medicine

## 2017-04-22 MED ORDER — VITAMIN D (ERGOCALCIFEROL) 1.25 MG (50000 UNIT) PO CAPS: 50000.0000 [IU] | ORAL_CAPSULE | ORAL | 0 refills | Status: DC

## 2017-04-22 NOTE — Telephone Encounter (Addendum)
Called patient to discuss low calcium and vitamin D. Her low calcium may be due to her low vitamin D.   Discussed that I would like for her to start 50,000 IU of vitamin D for 7 weeks and return to clinic for a Vitamin D check and calcium check. She was agreeable.   Smitty Cords, MD [place], PGY-3

## 2017-05-14 ENCOUNTER — Ambulatory Visit (INDEPENDENT_AMBULATORY_CARE_PROVIDER_SITE_OTHER): Payer: BLUE CROSS/BLUE SHIELD | Admitting: Family Medicine

## 2017-05-14 ENCOUNTER — Encounter: Payer: Self-pay | Admitting: Family Medicine

## 2017-05-14 ENCOUNTER — Other Ambulatory Visit: Payer: Self-pay

## 2017-05-14 VITALS — BP 94/60 | HR 102 | Temp 98.0°F | Ht 66.0 in | Wt 247.6 lb

## 2017-05-14 DIAGNOSIS — E039 Hypothyroidism, unspecified: Secondary | ICD-10-CM | POA: Diagnosis not present

## 2017-05-14 DIAGNOSIS — I10 Essential (primary) hypertension: Secondary | ICD-10-CM

## 2017-05-14 DIAGNOSIS — R7989 Other specified abnormal findings of blood chemistry: Secondary | ICD-10-CM | POA: Diagnosis not present

## 2017-05-14 MED ORDER — HYDROCHLOROTHIAZIDE 12.5 MG PO TABS: 12.5000 mg | ORAL_TABLET | Freq: Every day | ORAL | 3 refills | Status: DC

## 2017-05-14 NOTE — Assessment & Plan Note (Addendum)
Patient is having some episodes of hypotension, today's blood pressure is 94/60. She is endorsing some lightheadedness when standing too quickly. Wonder if she may also have some degree of orthostatic hypotension.  Discussed options with patient and the need to decrease her antihypertensive medication - Discontinue the lisinopril hydrochlorothiazide combo pill -Will just do hydrochlorothiazide 12.5 mg daily (patient prefers this medication as it has a small diuretic effect for her additionally this would be better for her hypocalcemia) -Return to clinic in 2 months and if blood pressure remains uncontrolled then may consider adding back on lisinopril or increasing hydrochlorothiazide -Asked patient to please check her blood pressure outside of the clinic and discuss goal blood pressure of 140/90

## 2017-05-14 NOTE — Patient Instructions (Signed)
Thank you for coming in today, it was so nice to see you! Today we talked about:    Blood pressure: Your pressure was a little low today. We stopped your blood pressure combination pill and started you on hydrochlorothiazide instead. This is at your pharmacy. Goal pressure is below 140/90.   We will need to recheck your Vitamin D and thyroid in 3 weeks at the lab. You can just walk in and have this done   I would like to see you back in 2 months for diabetes check and we will check your A1C then. You can schedule this appointment at the front desk before you leave or call the clinic.   If we ordered any tests today, you will be notified via telephone of any abnormalities. If everything is normal you will get a letter in the mail.   If you have any questions or concerns, please do not hesitate to call the office at [phone]. You can also message me directly via MyChart.   Sincerely,  Smitty Cords, MD

## 2017-05-14 NOTE — Progress Notes (Signed)
Subjective:    Patient ID: Allison [name] , female   DOB: [birth year] , 62 y.o..   MRN: [id number]  HPI  Allison [name] is 62 y.o. year old female PMH of T2DM, Hypothyroidism, CKD 3, recurrent kidney stones, obesity, PE, HLD, GERD, depression, anxiety here for   1.Hypertension Blood pressure at home: Does not check Exercise: No exercise Low salt diet: Compliant mostly time Medications: Compliant with lisinopril hydrochlorothiazide pill Side effects: Feels lightheaded and dizzy sometimes when she gets up too fast. No syncope of LOC.  ROS: Denies visual changes, nausea, vomiting, chest pain, abdominal pain or shortness of breath. BP Readings from Last 3 Encounters:  05/14/17 94/60  04/19/17 110/62  03/18/17 130/78   Review of Systems: Per HPI.    Medications: reviewed  Current Outpatient Medications  Medication Sig Dispense Refill  . acetaminophen (TYLENOL) 500 MG tablet Take 1,000 mg by mouth every 8 (eight) hours as needed for mild pain or headache.     . albuterol (PROAIR HFA) 108 (90 Base) MCG/ACT inhaler USE 2 INHALATIONS EVERY 6 HOURS AS NEEDED FOR WHEEZING OR SHORTNESS OF BREATH 17 g 1  . ALPRAZolam (XANAX) 0.5 MG tablet Take 1 tablet (0.5 mg total) by mouth 3 (three) times daily as needed for anxiety. 90 tablet 0  . cetirizine (ZYRTEC) 10 MG tablet Take 1 tablet (10 mg total) by mouth daily. 90 tablet 1  . glyBURIDE (DIABETA) 5 MG tablet Take 1 tablet (5 mg total) by mouth 2 (two) times daily with a meal. 180 tablet 3  . hydrochlorothiazide (HYDRODIURIL) 12.5 MG tablet Take 1 tablet (12.5 mg total) by mouth daily. 90 tablet 3  . levothyroxine (SYNTHROID, LEVOTHROID) 100 MCG tablet Take 1 tablet (100 mcg total) by mouth daily. 90 tablet 0  . metFORMIN (GLUCOPHAGE) 1000 MG tablet Take 1 tablet (1,000 mg total) by mouth 2 (two) times daily with a meal. 180 tablet 0  . montelukast (SINGULAIR) 10 MG tablet Take 1 tablet (10 mg total) by mouth at bedtime. 90 tablet 3  .  pravastatin (PRAVACHOL) 40 MG tablet Take 1 tablet (40 mg total) by mouth at bedtime. 90 tablet 1  . Vitamin D, Ergocalciferol, (DRISDOL) 50000 units CAPS capsule Take 1 capsule (50,000 Units total) by mouth every 7 (seven) days. 10 capsule 0   No current facility-administered medications for this visit.     Social Hx:  reports that  has never smoked. she has never used smokeless tobacco.   Objective:   BP 94/60   [name] (!) 102   Temp 98 F (36.7 C) (Oral)   Ht 5\' 6"  (1.676 m)   Wt 247 lb 9.6 oz (112.3 kg)   SpO2 99%   BMI 39.96 kg/m  Physical Exam  Gen: NAD, alert, cooperative with exam, well-appearing Cardiac: Regular rate and rhythm Respiratory:  non-labored breathing Psych: good insight, normal mood and affect  Assessment & Plan:  HYPERTENSION, BENIGN SYSTEMIC Patient is having some episodes of hypotension, today's blood pressure is 94/60. She is endorsing some lightheadedness when standing too quickly. Wonder if she may also have some degree of orthostatic hypotension.  Discussed options with patient and the need to decrease her antihypertensive medication - Discontinue the lisinopril hydrochlorothiazide combo pill -Will just do hydrochlorothiazide 12.5 mg daily (patient prefers this medication as it has a small diuretic effect for her additionally this would be better for her hypocalcemia) -Return to clinic in 2 months and if blood pressure remains uncontrolled then may  consider adding back on lisinopril or increasing hydrochlorothiazide -Asked patient to please check her blood pressure outside of the clinic and discuss goal blood pressure of 140/90  Orders Placed This Encounter  Procedures  . VITAMIN D 25 Hydroxy (Vit-D Deficiency, Fractures)    Standing Status:   Future    Standing Expiration Date:   05/14/2018  . TSH    Standing Status:   Future    Standing Expiration Date:   05/14/2018  . Basic Metabolic Panel    Standing Status:   Future    Standing Expiration  Date:   05/14/2018   Meds ordered this encounter  Medications  . hydrochlorothiazide (HYDRODIURIL) 12.5 MG tablet    Sig: Take 1 tablet (12.5 mg total) by mouth daily.    Dispense:  90 tablet    Refill:  3    Smitty Cords, MD [place], PGY-3

## 2017-06-04 ENCOUNTER — Other Ambulatory Visit: Payer: BLUE CROSS/BLUE SHIELD

## 2017-06-04 DIAGNOSIS — R7989 Other specified abnormal findings of blood chemistry: Secondary | ICD-10-CM

## 2017-06-04 DIAGNOSIS — E039 Hypothyroidism, unspecified: Secondary | ICD-10-CM

## 2017-06-05 ENCOUNTER — Telehealth: Payer: Self-pay | Admitting: Student in an Organized Health Care Education/Training Program

## 2017-06-05 LAB — BASIC METABOLIC PANEL
BUN/Creatinine Ratio: 12 (ref 12–28)
BUN: 19 mg/dL (ref 8–27)
CALCIUM: 6.7 mg/dL — AB (ref 8.7–10.3)
CHLORIDE: 95 mmol/L — AB (ref 96–106)
CO2: 30 mmol/L — AB (ref 20–29)
Creatinine, Ser: 1.63 mg/dL — ABNORMAL HIGH (ref 0.57–1.00)
GFR, EST AFRICAN AMERICAN: 39 mL/min/{1.73_m2} — AB (ref >59)
GFR, EST NON AFRICAN AMERICAN: 34 mL/min/{1.73_m2} — AB (ref >59)
Glucose: 178 mg/dL — ABNORMAL HIGH (ref 65–99)
POTASSIUM: 3.7 mmol/L (ref 3.5–5.2)
SODIUM: 143 mmol/L (ref 134–144)

## 2017-06-05 LAB — TSH: TSH: 0.629 u[IU]/mL (ref 0.450–4.500)

## 2017-06-05 LAB — VITAMIN D 25 HYDROXY (VIT D DEFICIENCY, FRACTURES): VIT D 25 HYDROXY: 31.2 ng/mL (ref 30.0–100.0)

## 2017-06-05 NOTE — Telephone Encounter (Signed)
**  Critical Lab Alert**  Received page for critical lab value from [facility] - Calcium low at 6.7. Per chart review this appears to be a chronic problem for the patient and it is being actively managed by PCP.   Will forward to PCP.  Everrett Coombe, MD PGY-2 [facility]

## 2017-06-06 ENCOUNTER — Telehealth: Payer: Self-pay | Admitting: Family Medicine

## 2017-06-06 MED ORDER — VITAMIN D 1000 UNITS PO TABS: 1000.0000 [IU] | ORAL_TABLET | Freq: Every day | ORAL | 2 refills | Status: DC

## 2017-06-06 MED ORDER — CALCIUM CARBONATE 1500 (600 CA) MG PO TABS: 1500.0000 mg | ORAL_TABLET | Freq: Two times a day (BID) | ORAL | 1 refills | Status: DC

## 2017-06-06 NOTE — Telephone Encounter (Signed)
Called patient to discuss the following:   1. Low calcium: calcium still low at 6.7 . Every once in awhile she will get a facial twitch. No chest pain or palpitations. PTH previously normal when checked. Will prescribe calcium carbonate 1500 mg BID x 1 week  2. Vitamin D : normalized to 30. Would still like her to supplement with 1000 U Vit D daily.   3. Elevated Cr: Cr rising from 1.18 to 1.63, signs of AKI. Unsure why. Patient states she has had a gout flare and has been taking lots of ibuprofen. Advised only to take Tylenol as needed for pain and to drink plenty of water.  I made an appt for her on 3/18 to recheck BMP and magnesium and discuss the above issues. She was appreciative of the call.   Smitty Cords, MD [place], PGY-3

## 2017-06-07 NOTE — Telephone Encounter (Signed)
Contacted pharmacy about the below Rx's and they stated that they do have them on hold but that the pt insurance will not pay for them, she will need to get them over the counter.  Contacted pt and informed her of this. Katharina Caper, Dreyson Mishkin D, [place]

## 2017-06-07 NOTE — Telephone Encounter (Signed)
Pt said the pharmacy did not get the Rx for the Vitamin D or the Calcium pill that were sent yesterday. She just got off the phone with them this morning before she called and they still did not get it.

## 2017-06-16 ENCOUNTER — Other Ambulatory Visit: Payer: Self-pay | Admitting: Family Medicine

## 2017-06-16 DIAGNOSIS — E119 Type 2 diabetes mellitus without complications: Secondary | ICD-10-CM

## 2017-06-17 ENCOUNTER — Encounter: Payer: Self-pay | Admitting: Family Medicine

## 2017-06-17 ENCOUNTER — Other Ambulatory Visit: Payer: Self-pay

## 2017-06-17 ENCOUNTER — Ambulatory Visit (INDEPENDENT_AMBULATORY_CARE_PROVIDER_SITE_OTHER): Payer: BLUE CROSS/BLUE SHIELD | Admitting: Family Medicine

## 2017-06-17 DIAGNOSIS — N183 Chronic kidney disease, stage 3 unspecified: Secondary | ICD-10-CM

## 2017-06-17 DIAGNOSIS — M25571 Pain in right ankle and joints of right foot: Secondary | ICD-10-CM | POA: Diagnosis not present

## 2017-06-17 DIAGNOSIS — F411 Generalized anxiety disorder: Secondary | ICD-10-CM

## 2017-06-17 MED ORDER — PREDNISONE 20 MG PO TABS
ORAL_TABLET | ORAL | 0 refills | Status: DC
Start: 2017-06-17 — End: 2018-04-11

## 2017-06-17 NOTE — Patient Instructions (Addendum)
Thank you for coming in today, it was so nice to see you! Today we talked about:    Low calcium: Good job taking the calcium and vitamin D, please continue to do this.  Take 1 pink pill in the morning and one at night. We are rechecking your calcium today along with your magnesium  Kidneys; we rechecked blood work today to make sure her kidneys are okay.  Stop taking the hydrochlorothiazide for a week.  Continue to drink plenty of water until urine is clear.  Do not take ibuprofen or the indomethacin.  Gout: I have sent a prescription for prednisone. Stop taking the indomethacin  Anxiety: Please call the number on the yellow paper to schedule an appointment  We will see when you need to follow up based on your blood work today  If we ordered any tests today, you will be notified via telephone of any abnormalities. If everything is normal you will get a letter in the mail.   If you have any questions or concerns, please do not hesitate to call the office at [phone]. You can also message me directly via MyChart.   Sincerely,  Smitty Cords, MD

## 2017-06-17 NOTE — Progress Notes (Deleted)
Subjective:    Patient ID: Allison [name] , female   DOB: Apr 11, 1955 , 62 y.o..   MRN: [id number]  HPI  Allison [name] is here for No chief complaint on file.   1. Hypocalcemia:   2. AKI:   3. Vitamin D:   Review of Systems: Per HPI.   Past Medical History: Patient Active Problem List   Diagnosis Date Noted  . Hypocalcemia 04/19/2017  . S/P laparoscopic cholecystectomy 06/04/2016  . Chronic kidney disease (CKD), stage III (moderate) ([place]) 09/12/2015  . Uric acid renal calculus 11/24/2012  . Nephrolithiasis 10/18/2011  . Hypothyroid 10/10/2010  . Venous stasis dermatitis 09/26/2010  . Asthma 01/03/2010  . PROTEINURIA 08/25/2009  . MORBID OBESITY 04/14/2009  . KNEE PAIN, BILATERAL 04/14/2009  . GOITER NOS 05/30/2006  . Type II diabetes mellitus with complication ([place]) 44/96/7591  . HYPERLIPIDEMIA 05/30/2006  . Anxiety state 05/30/2006  . DEPRESSIVE DISORDER, NOS 05/30/2006  . HYPERTENSION, BENIGN SYSTEMIC 05/30/2006  . GERD 04/05/2006  . LOW BACK PAIN 04/05/2006    Medications: reviewed and updated Current Outpatient Medications  Medication Sig Dispense Refill  . acetaminophen (TYLENOL) 500 MG tablet Take 1,000 mg by mouth every 8 (eight) hours as needed for mild pain or headache.     . albuterol (PROAIR HFA) 108 (90 Base) MCG/ACT inhaler USE 2 INHALATIONS EVERY 6 HOURS AS NEEDED FOR WHEEZING OR SHORTNESS OF BREATH 17 g 1  . ALPRAZolam (XANAX) 0.5 MG tablet Take 1 tablet (0.5 mg total) by mouth 3 (three) times daily as needed for anxiety. 90 tablet 0  . calcium carbonate (OSCAL) 1500 (600 Ca) MG TABS tablet Take 1 tablet (1,500 mg total) by mouth 2 (two) times daily with a meal. 60 tablet 1  . cetirizine (ZYRTEC) 10 MG tablet Take 1 tablet (10 mg total) by mouth daily. 90 tablet 1  . cholecalciferol (VITAMIN D) 1000 units tablet Take 1 tablet (1,000 Units total) by mouth daily. 30 tablet 2  . glyBURIDE (DIABETA) 5 MG tablet Take 1 tablet (5 mg total) by mouth 2  (two) times daily with a meal. 180 tablet 3  . hydrochlorothiazide (HYDRODIURIL) 12.5 MG tablet Take 1 tablet (12.5 mg total) by mouth daily. 90 tablet 3  . levothyroxine (SYNTHROID, LEVOTHROID) 100 MCG tablet Take 1 tablet (100 mcg total) by mouth daily. 90 tablet 0  . metFORMIN (GLUCOPHAGE) 1000 MG tablet Take 1 tablet (1,000 mg total) by mouth 2 (two) times daily with a meal. 180 tablet 0  . montelukast (SINGULAIR) 10 MG tablet Take 1 tablet (10 mg total) by mouth at bedtime. 90 tablet 3  . pravastatin (PRAVACHOL) 40 MG tablet Take 1 tablet (40 mg total) by mouth at bedtime. 90 tablet 1   No current facility-administered medications for this visit.     Social Hx:  reports that  has never smoked. she has never used smokeless tobacco.   Objective:   There were no vitals taken for this visit. Physical Exam  Gen: NAD, alert, cooperative with exam, well-appearing HEENT: NCAT, PERRL, clear conjunctiva, oropharynx clear, supple neck Cardiac: Regular rate and rhythm, normal S1/S2, no murmur, no edema, capillary refill brisk  Respiratory: Clear to auscultation bilaterally, no wheezes, non-labored breathing Gastrointestinal: soft, non tender, non distended, bowel sounds present Skin: no rashes, normal turgor  Neurological: no gross deficits.  Psych: good insight, normal mood and affect  Assessment & Plan:  No problem-specific Assessment & Plan notes found for this encounter.  No orders of  the defined types were placed in this encounter.  No orders of the defined types were placed in this encounter.   Smitty Cords, MD [place], PGY-3
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
## 2017-06-18 LAB — BASIC METABOLIC PANEL
BUN/Creatinine Ratio: 13 (ref 12–28)
BUN: 19 mg/dL (ref 8–27)
CO2: 29 mmol/L (ref 20–29)
CREATININE: 1.48 mg/dL — AB (ref 0.57–1.00)
Calcium: 9 mg/dL (ref 8.7–10.3)
Chloride: 96 mmol/L (ref 96–106)
GFR calc Af Amer: 44 mL/min/{1.73_m2} — ABNORMAL LOW (ref >59)
GFR, EST NON AFRICAN AMERICAN: 38 mL/min/{1.73_m2} — AB (ref >59)
GLUCOSE: 110 mg/dL — AB (ref 65–99)
Potassium: 4 mmol/L (ref 3.5–5.2)
Sodium: 144 mmol/L (ref 134–144)

## 2017-06-18 LAB — MAGNESIUM: MAGNESIUM: 1.4 mg/dL — AB (ref 1.6–2.3)

## 2017-10-15 ENCOUNTER — Other Ambulatory Visit: Payer: Self-pay

## 2017-10-16 MED ORDER — LEVOTHYROXINE SODIUM 100 MCG PO TABS: 100.0000 ug | ORAL_TABLET | Freq: Every day | ORAL | 3 refills | Status: DC

## 2017-12-11 IMAGING — RF DG C-ARM 61-120 MIN-NO REPORT
1 series · 5 of 5 positions shown · non-contrast
Comparison: none

[Series 1: run · 5 of 5 slices shown]
[im 1/5]
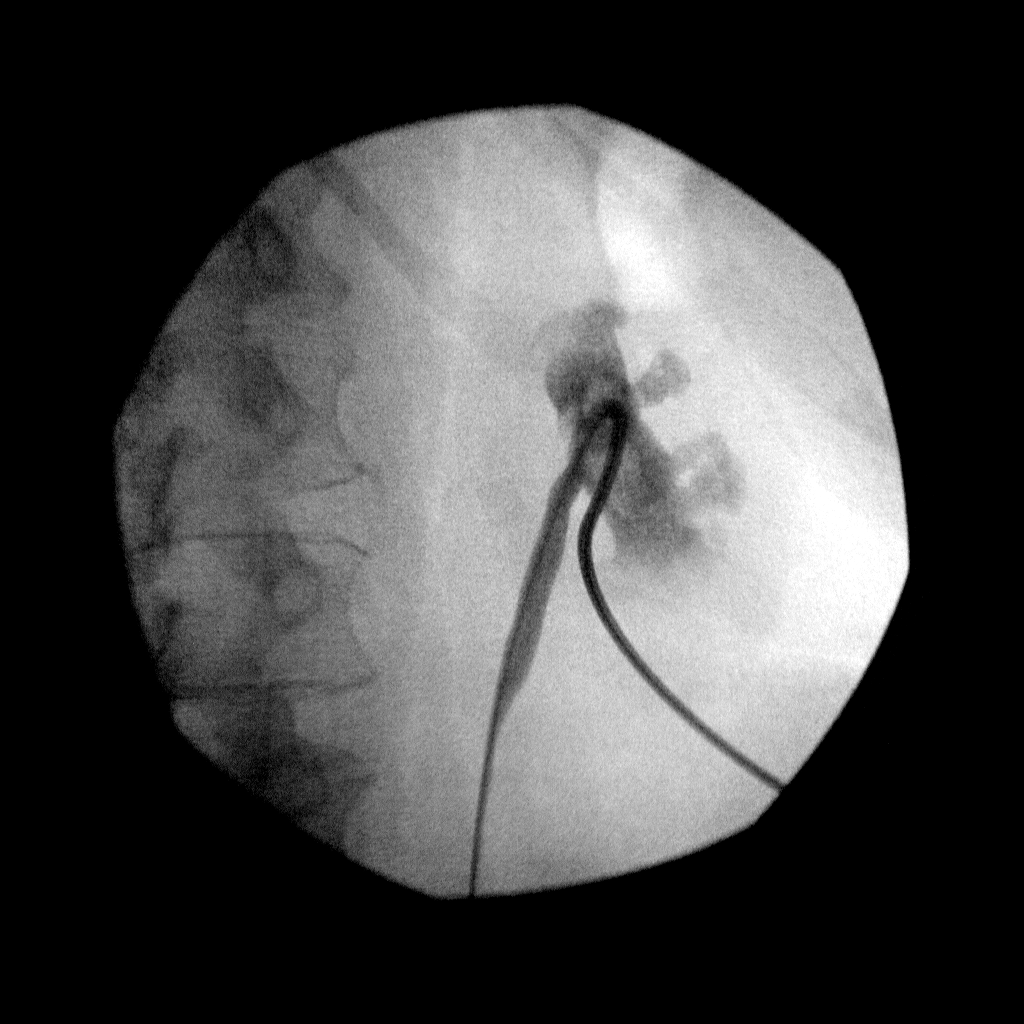
[im 2/5]
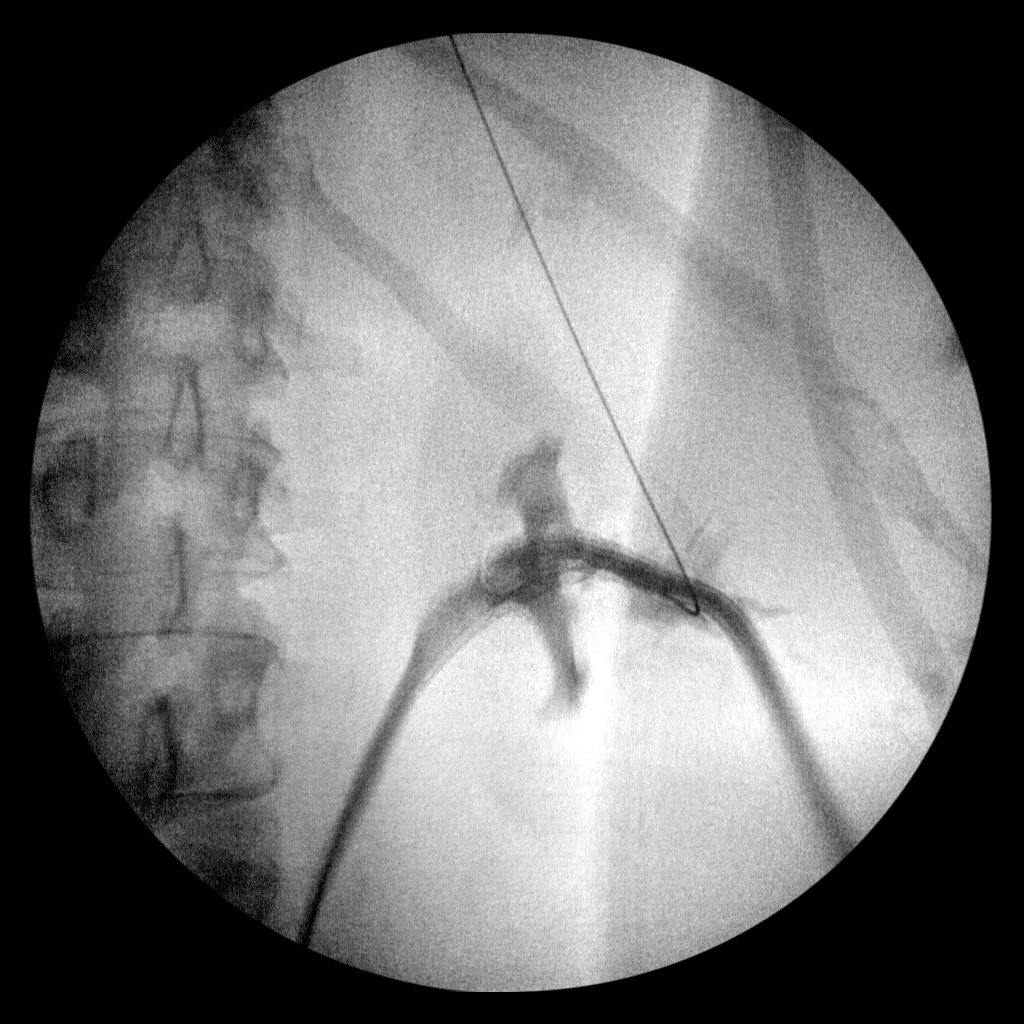
[im 3/5]
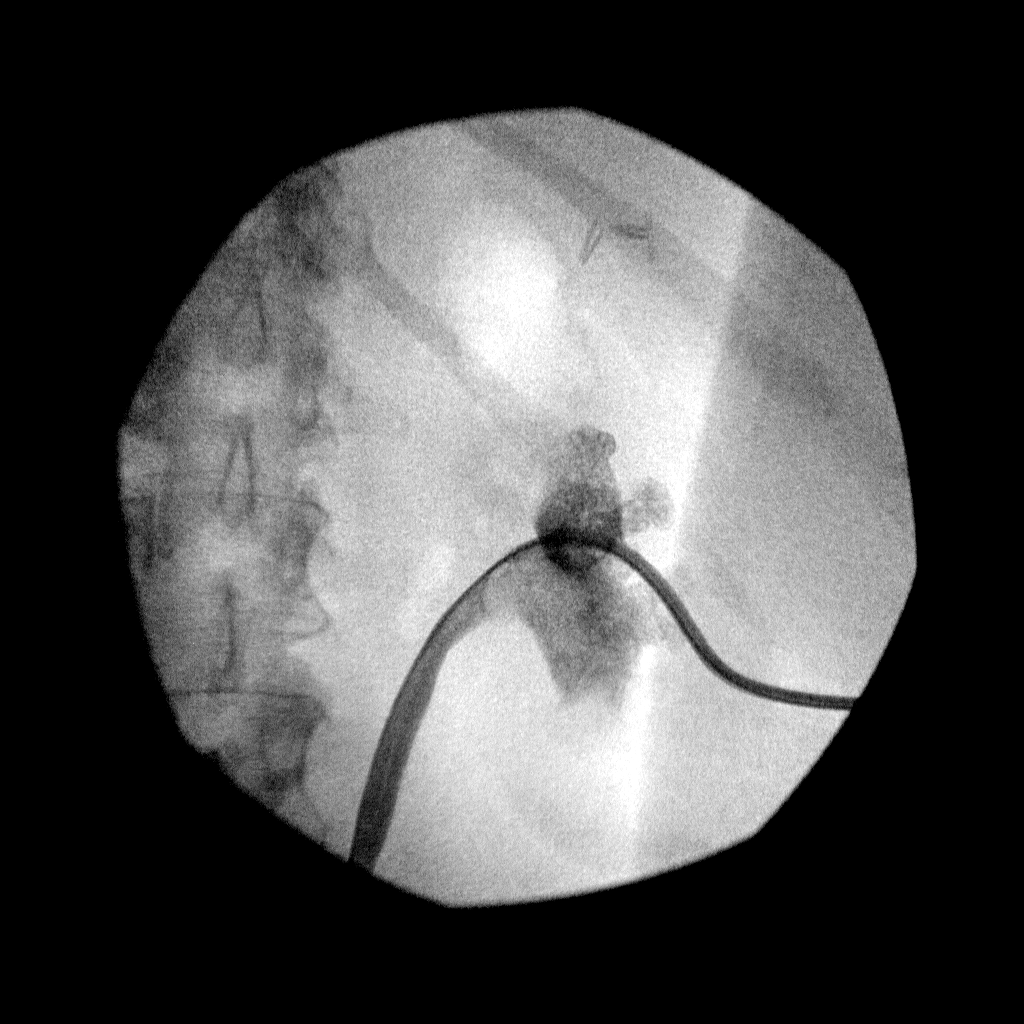
[im 4/5]
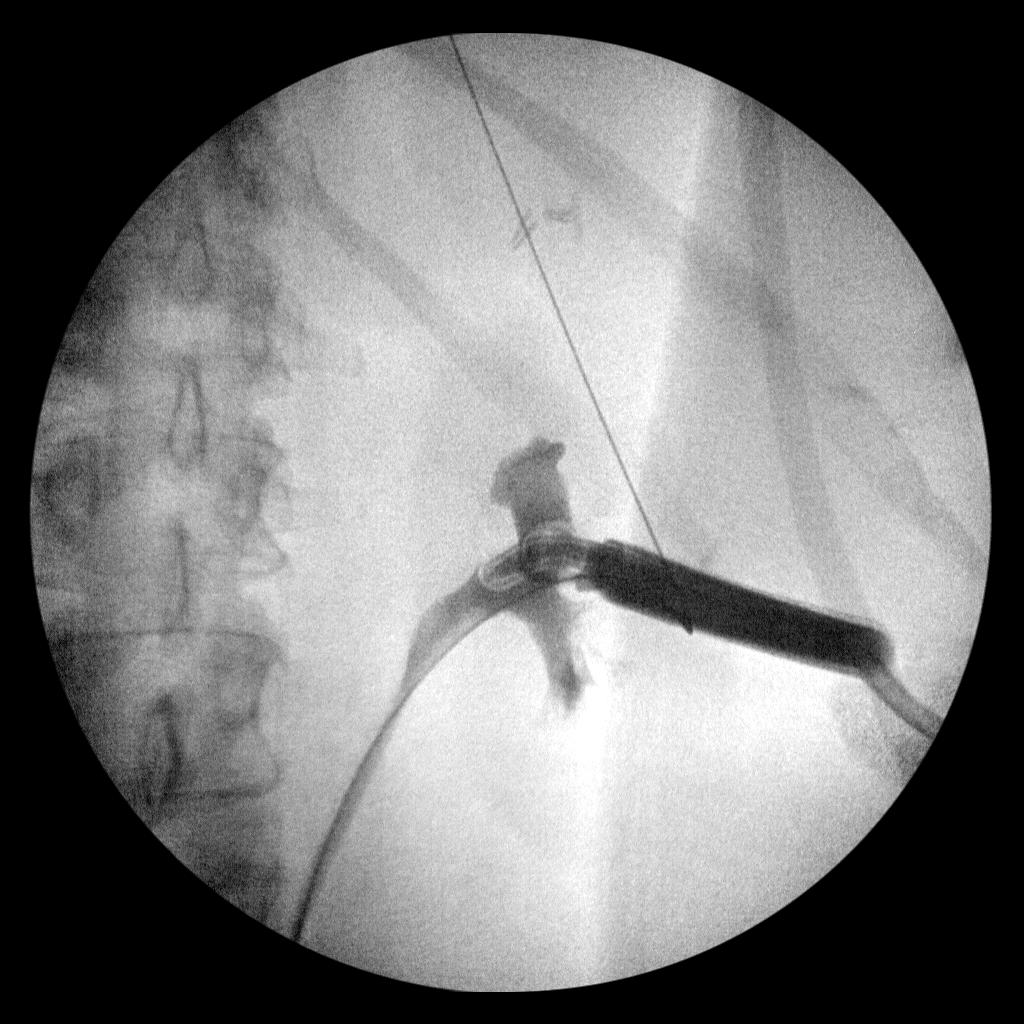
[im 5/5]
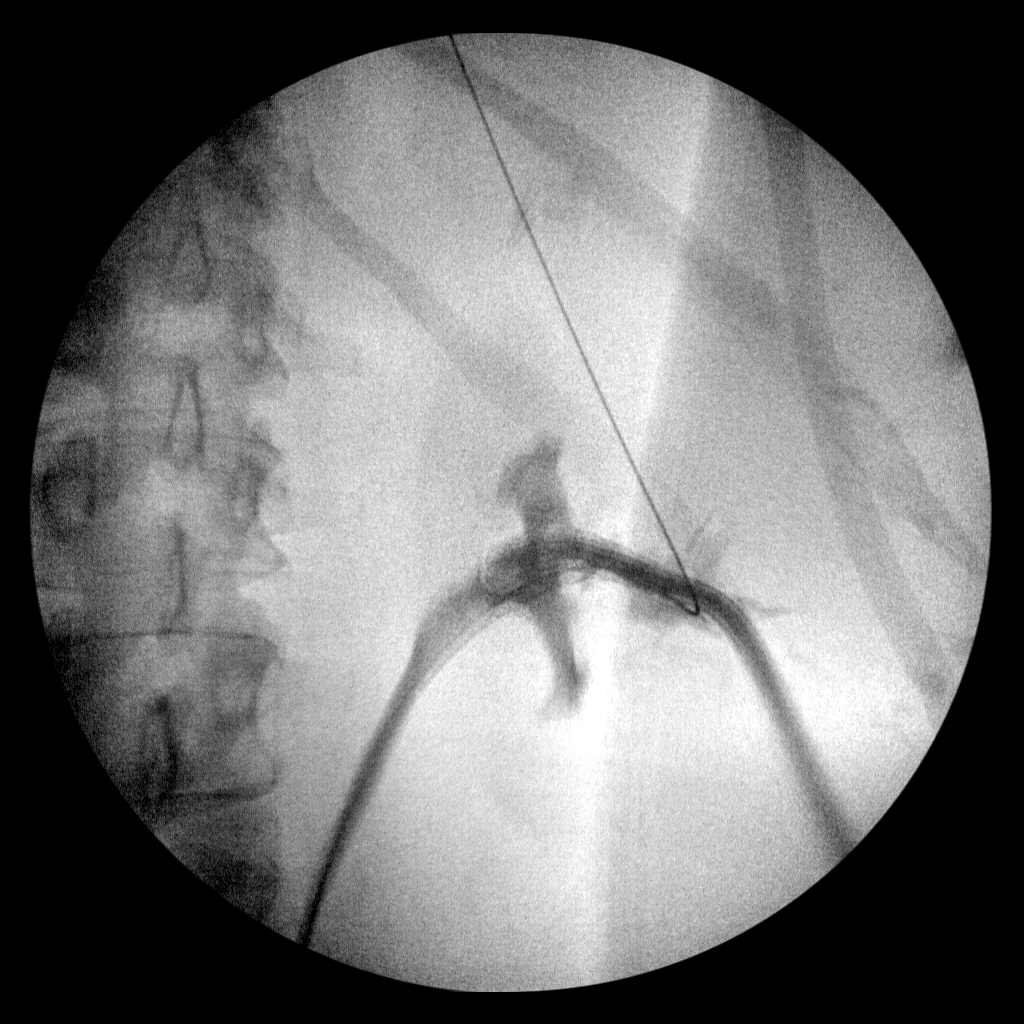

[5 of 5 positions shown; findings below may reference images not displayed]

Canned report from images found in remote index.

Refer to host system for actual result text.

## 2017-12-11 IMAGING — XA IR URETURAL STENT RIGHT NEW ACCESS W/O SEP NEPHROSTOMY CATH
3 series · 9 of 9 positions shown · non-contrast
Comparison: None.

INDICATION: Staghorn right renal calculus

EXAM:
RIGHT PERCUTANEOUS NEPHROURETERAL CATHETER PLACEMENT

[Series 1: care single · 1 of 1 slices shown]
[im 1/1]
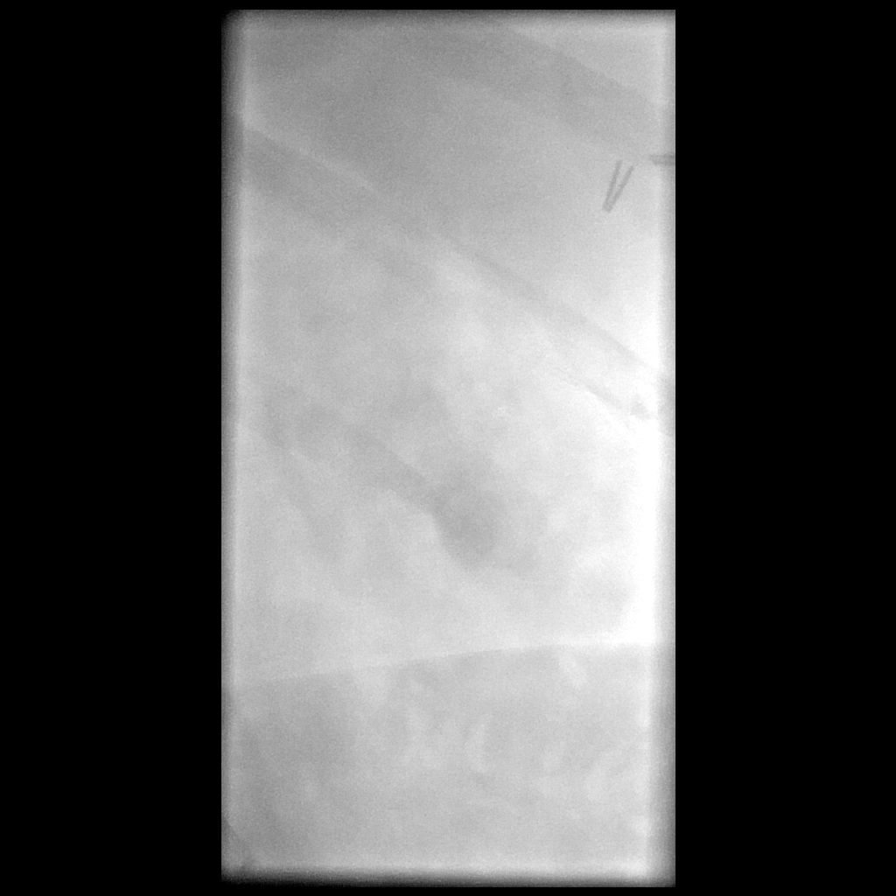

[Series 2: fl - angio · 4 of 89 frames shown]
[frame 11/89]
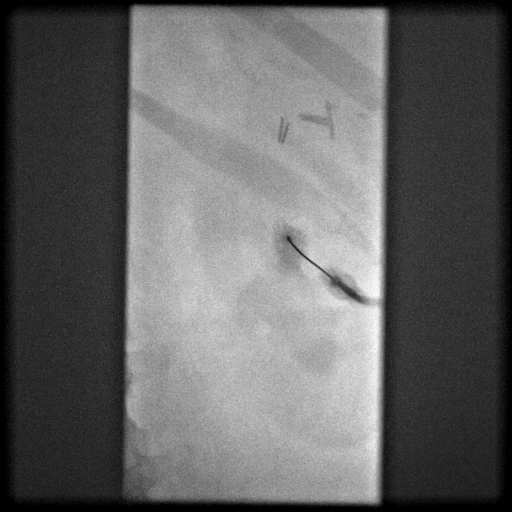
[frame 14/89]
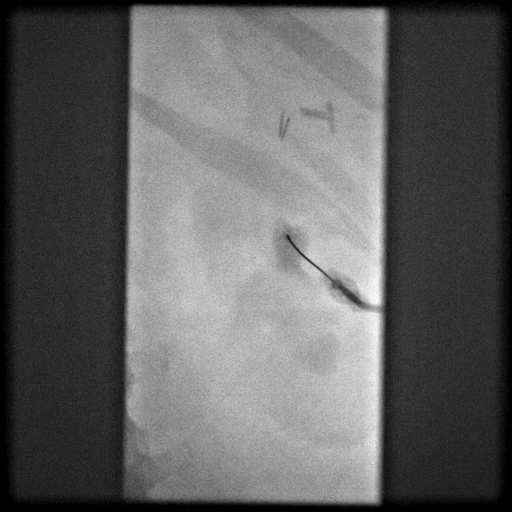
[frame 45/89]
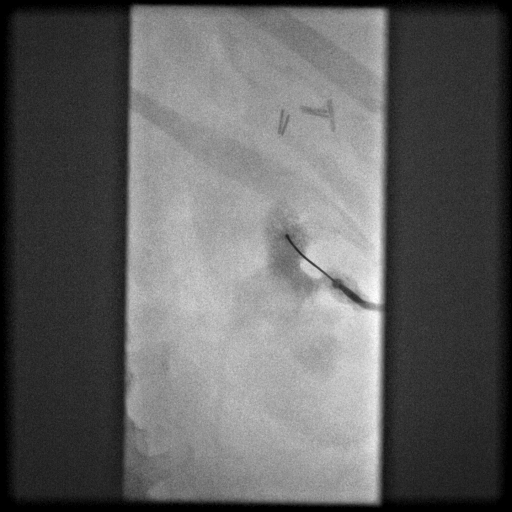
[frame 76/89]
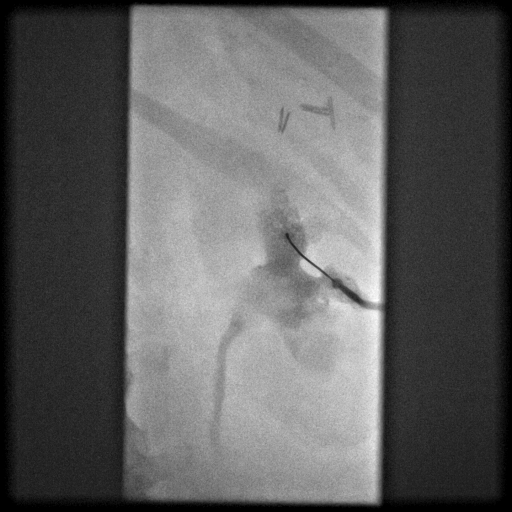

[Series 300: ir ureteral stent right new access w/o s · non-contrast · 4 of 4 slices shown]
[im 1/4]
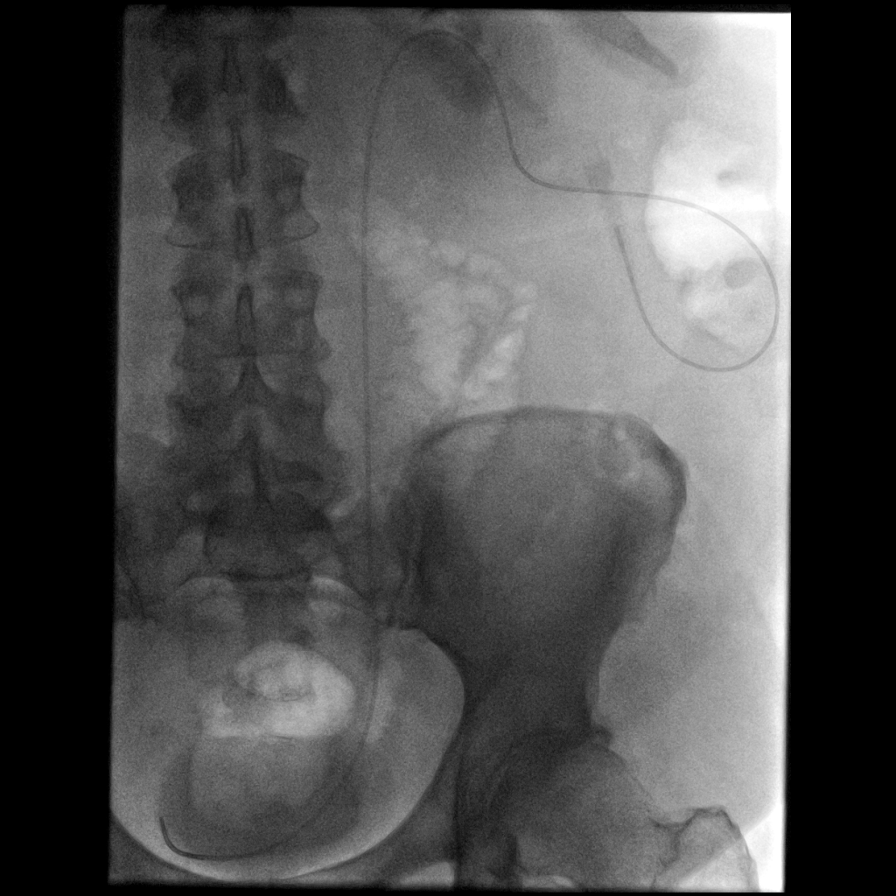
[im 2/4]
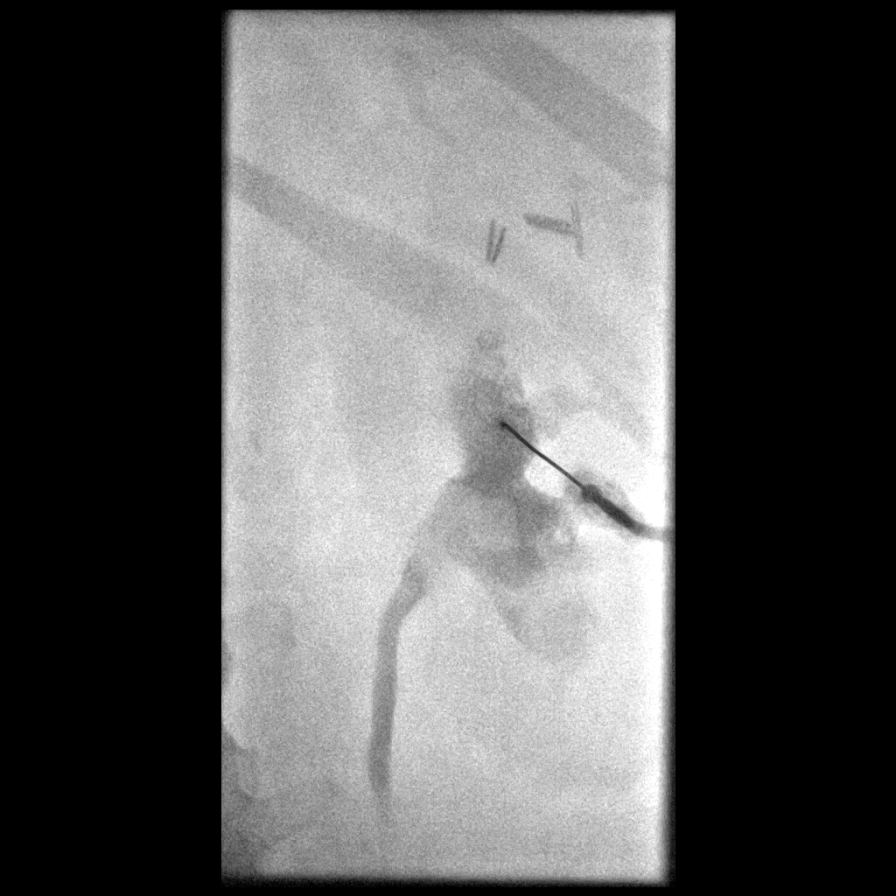
[im 3/4]
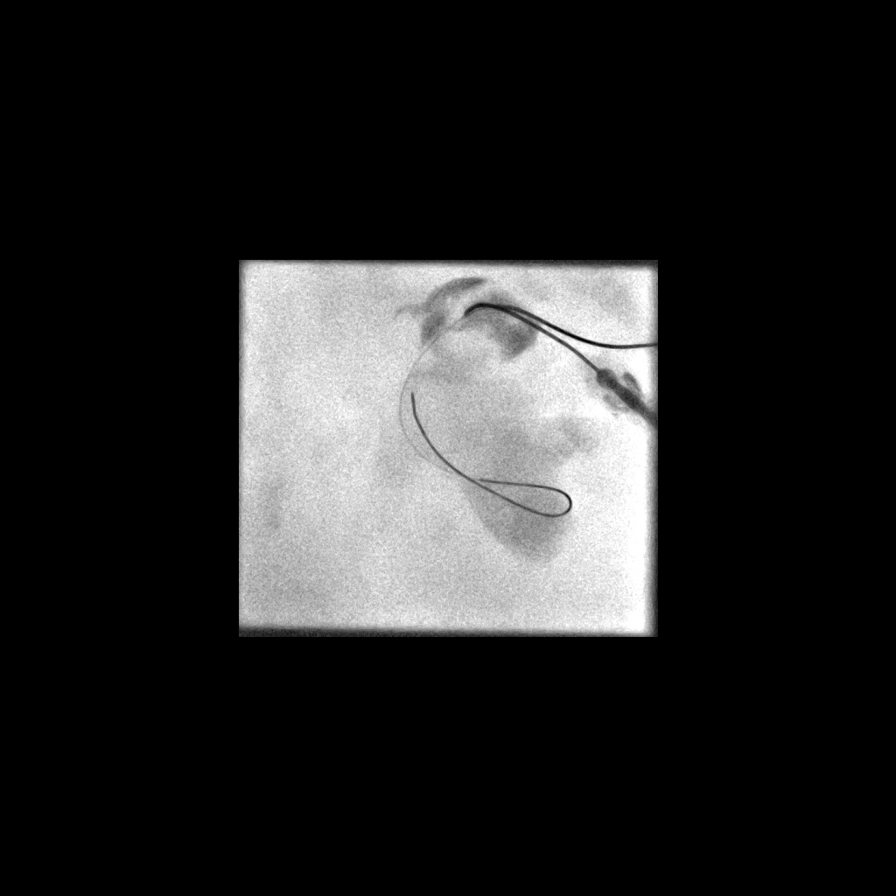
[im 4/4]
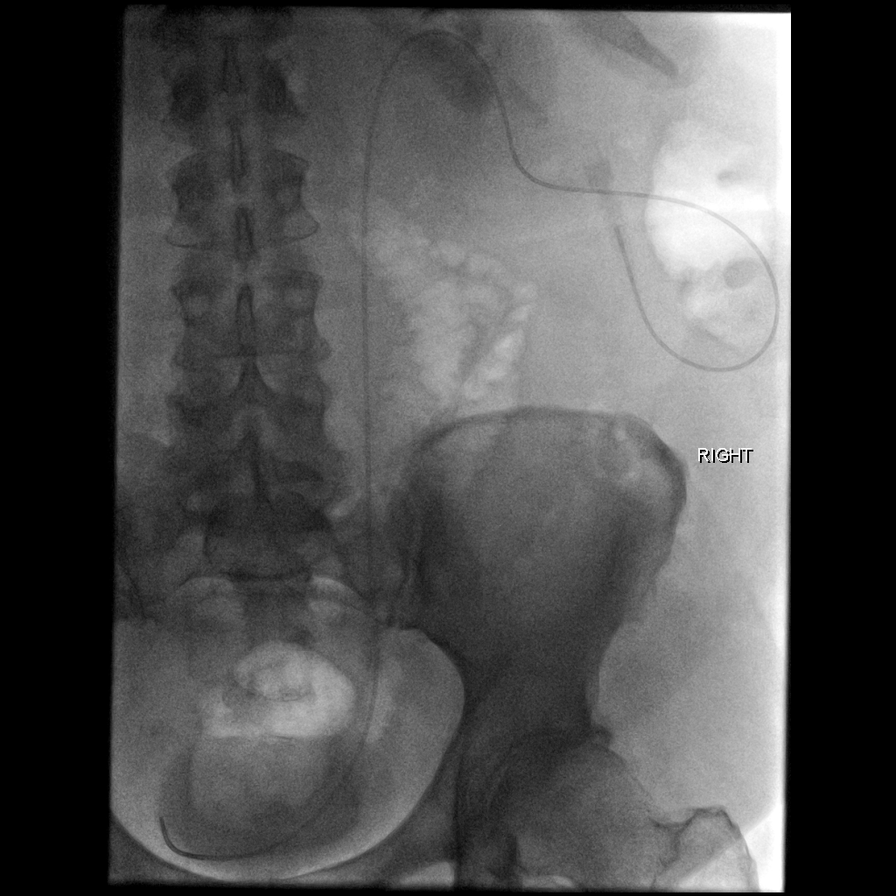

[9 of 9 positions shown; findings below may reference images not displayed]

MEDICATIONS:
Ciprofloxacin 400 mg IV; The antibiotic was administered in an
appropriate time frame prior to skin puncture.

ANESTHESIA/SEDATION:
Fentanyl 200 mcg IV; Versed 4 mg IV

Moderate Sedation Time:  22

The patient was continuously monitored during the procedure by the
interventional radiology nurse under my direct supervision.

CONTRAST:  10 cc [id number] - administered into the collecting
system(s)

FLUOROSCOPY TIME:  Fluoroscopy Time: 5 minutes 0 seconds (353 mGy).

COMPLICATIONS:
None immediate.

PROCEDURE:
Informed written consent was obtained from the patient after a
thorough discussion of the procedural risks, benefits and
alternatives. All questions were addressed. Maximal Sterile Barrier
Technique was utilized including caps, mask, sterile gowns, sterile
gloves, sterile drape, hand hygiene and skin antiseptic. A timeout
was performed prior to the initiation of the procedure.

1% lidocaine was utilized for local anesthesia. Under fluoroscopic
guidance, a 22 gauge needle was advanced into the upper pole calyx
containing stone. The trajectory was below the twelfth rib. Contrast
was injected opacifying the collecting system. A second 22 gauge
needle was advanced into the identical upper pole calyx from a more
lateral approach and removed over a 018 wire which was up sized to Amazigh
Gobor. This was advanced into the bladder. A 5 French Kumpe
catheter was advanced over the Gobor into the bladder. It was sewn
in place. Contrast was injected opacifying the bladder.
FINDINGS: Imaging demonstrates access into the right renal collecting system
via upper pole approach. Access under the twelfth rib is achieved.
The tip of the catheter is in the bladder.
IMPRESSION: Successful right nephroureteral catheter placement.

## 2017-12-30 ENCOUNTER — Other Ambulatory Visit: Payer: Self-pay | Admitting: Family Medicine

## 2018-01-10 ENCOUNTER — Other Ambulatory Visit: Payer: Self-pay | Admitting: Family Medicine

## 2018-01-17 ENCOUNTER — Other Ambulatory Visit: Payer: Self-pay | Admitting: Family Medicine

## 2018-01-17 DIAGNOSIS — E119 Type 2 diabetes mellitus without complications: Secondary | ICD-10-CM

## 2018-02-12 ENCOUNTER — Other Ambulatory Visit: Payer: Self-pay | Admitting: Family Medicine

## 2018-02-12 DIAGNOSIS — E119 Type 2 diabetes mellitus without complications: Secondary | ICD-10-CM

## 2018-02-19 ENCOUNTER — Other Ambulatory Visit: Payer: Self-pay

## 2018-02-19 DIAGNOSIS — E119 Type 2 diabetes mellitus without complications: Secondary | ICD-10-CM

## 2018-02-19 MED ORDER — GLYBURIDE 5 MG PO TABS: ORAL_TABLET | ORAL | 0 refills | Status: DC

## 2018-02-20 ENCOUNTER — Telehealth: Payer: Self-pay | Admitting: *Deleted

## 2018-02-20 NOTE — Telephone Encounter (Signed)
-----   Message from [name], MD sent at 02/13/2018  9:35 AM EST ----- Regarding: Pt needs appointment Please let the patient know that I have refilled her glyburide for 1 month because she has not been seen in our clinic for the the last 10 months, and she needs to come in to see me to check her diabetes before she gets more medicine.  Thanks.

## 2018-02-20 NOTE — Telephone Encounter (Signed)
LVM to call office back to inform her of below and to schedule an appointment per Dr. Shan Levans. Katharina Caper, April D, [place]

## 2018-03-11 NOTE — Telephone Encounter (Signed)
Contacted pt and scheduled her an appointment on 04/11/2018 to check up with her diabetes. Routing to PCP as an Pharmacist, hospital. Katharina Caper, April D, [place]

## 2018-04-11 ENCOUNTER — Other Ambulatory Visit: Payer: Self-pay

## 2018-04-11 ENCOUNTER — Encounter: Payer: Self-pay | Admitting: Family Medicine

## 2018-04-11 ENCOUNTER — Ambulatory Visit (INDEPENDENT_AMBULATORY_CARE_PROVIDER_SITE_OTHER): Payer: BLUE CROSS/BLUE SHIELD | Admitting: Family Medicine

## 2018-04-11 VITALS — BP 125/83 | HR 102 | Temp 98.5°F | Wt 245.0 lb

## 2018-04-11 DIAGNOSIS — Z0000 Encounter for general adult medical examination without abnormal findings: Secondary | ICD-10-CM

## 2018-04-11 DIAGNOSIS — E119 Type 2 diabetes mellitus without complications: Secondary | ICD-10-CM

## 2018-04-11 DIAGNOSIS — F411 Generalized anxiety disorder: Secondary | ICD-10-CM

## 2018-04-11 DIAGNOSIS — E118 Type 2 diabetes mellitus with unspecified complications: Secondary | ICD-10-CM | POA: Diagnosis not present

## 2018-04-11 DIAGNOSIS — E890 Postprocedural hypothyroidism: Secondary | ICD-10-CM

## 2018-04-11 LAB — POCT GLYCOSYLATED HEMOGLOBIN (HGB A1C): HbA1c, POC (controlled diabetic range): 8.6 % — AB (ref 0.0–7.0)

## 2018-04-11 MED ORDER — LEVOTHYROXINE SODIUM 100 MCG PO TABS: 100.0000 ug | ORAL_TABLET | Freq: Every day | ORAL | 3 refills | Status: DC

## 2018-04-11 MED ORDER — CETIRIZINE HCL 10 MG PO TABS: 10.0000 mg | ORAL_TABLET | Freq: Every day | ORAL | 1 refills | Status: DC

## 2018-04-11 MED ORDER — METFORMIN HCL 1000 MG PO TABS: ORAL_TABLET | ORAL | 0 refills | Status: DC

## 2018-04-11 MED ORDER — CALCIUM CARBONATE 1500 (600 CA) MG PO TABS: 1500.0000 mg | ORAL_TABLET | Freq: Two times a day (BID) | ORAL | 1 refills | Status: DC

## 2018-04-11 MED ORDER — ALPRAZOLAM 0.5 MG PO TABS: 0.5000 mg | ORAL_TABLET | Freq: Three times a day (TID) | ORAL | 0 refills | Status: DC | PRN

## 2018-04-11 MED ORDER — PRAVASTATIN SODIUM 40 MG PO TABS: 40.0000 mg | ORAL_TABLET | Freq: Every day | ORAL | 1 refills | Status: DC

## 2018-04-11 MED ORDER — MONTELUKAST SODIUM 10 MG PO TABS: 10.0000 mg | ORAL_TABLET | Freq: Every day | ORAL | 3 refills | Status: DC

## 2018-04-11 MED ORDER — ALBUTEROL SULFATE HFA 108 (90 BASE) MCG/ACT IN AERS: INHALATION_SPRAY | RESPIRATORY_TRACT | 0 refills | Status: DC

## 2018-04-11 NOTE — Progress Notes (Signed)
Subjective:    Allison [name] - 63 y.o. female MRN [id number]  Date of birth: 1955/04/22  CC:  Allison [name] is here for diabetes follow-up.  HPI: Blood sugars: needs meter, so has not been checking them Medications: glyburide 5 mg daily, metformin 1,000 BID Hypoglycemic symptoms: none Diet: no sweets, drinking more water, although was not careful with diet over the holidays She has had a difficult year with her husband suffering from an MI and its sequelae, but she says that things are improving.  Health Maintenance:  -Has not gotten a mammogram in several years but is amenable to getting one now.  Got her flu shot at a pharmacy this fall.  Had a total hysterectomy, so Pap smear is not needed Health Maintenance Due  Topic Date Due  . OPHTHALMOLOGY EXAM  10/19/1965  . COLONOSCOPY  10/19/2005  . MAMMOGRAM  01/08/2011    -  reports that she has never smoked. She has never used smokeless tobacco. - Review of Systems: Per HPI. - Past Medical History: Patient Active Problem List   Diagnosis Date Noted  . Health care maintenance 04/13/2018  . Hypocalcemia 04/19/2017  . S/P laparoscopic cholecystectomy 06/04/2016  . Chronic kidney disease (CKD), stage III (moderate) ([place]) 09/12/2015  . Uric acid renal calculus 11/24/2012  . Nephrolithiasis 10/18/2011  . Hypothyroid 10/10/2010  . Venous stasis dermatitis 09/26/2010  . Asthma 01/03/2010  . PROTEINURIA 08/25/2009  . MORBID OBESITY 04/14/2009  . KNEE PAIN, BILATERAL 04/14/2009  . GOITER NOS 05/30/2006  . Type II diabetes mellitus with complication (Allison [name]) 24/12/7351  . HYPERLIPIDEMIA 05/30/2006  . Anxiety state 05/30/2006  . DEPRESSIVE DISORDER, NOS 05/30/2006  . HYPERTENSION, BENIGN SYSTEMIC 05/30/2006  . GERD 04/05/2006  . LOW BACK PAIN 04/05/2006   - Medications: reviewed and updated   Objective:   Physical Exam BP 125/83   Pulse (!) 102   Temp 98.5 F (36.9 C) (Oral)   Wt 245 lb (111.1 kg)   SpO2 99%   BMI  39.54 kg/m  Gen: NAD, alert, cooperative with exam, well-appearing, pleasant CV: RRR, good S1/S2, no murmur Resp: CTABL, no wheezes, non-labored Abd: SNTND, BS present, no guarding or organomegaly Skin: no rashes, normal turgor  Neuro: no gross deficits.  Psych: good insight, tearful but hopeful during exam, alert and oriented  Diabetic Foot Exam - Simple   Simple Foot Form Diabetic Foot exam was performed with the following findings:  Yes 04/11/2018 11:55 AM  Visual Inspection No deformities, no ulcerations, no other skin breakdown bilaterally:  Yes Sensation Testing Intact to touch and monofilament testing bilaterally:  Yes Pulse Check Posterior Tibialis and Dorsalis pulse intact bilaterally:  Yes Comments         Assessment & Plan:   Type II diabetes mellitus with complication G9J today is 8.6, elevated from 7.0 1 year ago.  Discussed with patient that while she should continue her metformin as prescribed, glyburide is a third line medication, and we can likely switch her to a GLP-1 or SGLT2 inhibitor.  Patient is reticent to start an injectable medication, so an SGLT2 may work better for her, but given her CKD with last GFR of 38, we will wait to make any changes until we get a BMP.  We will also obtain a lipid panel today.  We agreed that I would call her with her results and discuss any medication changes at that time.  Discussed lifestyle modifications with patient as well.  She seems determined to  improve her glycemic control.  I would like to see her back in 3 months.  Hypothyroid Will obtain TSH today.  Patient is asymptomatic and continues to take levothyroxine 100 mcg daily.  Health care maintenance Patient would like to try Cologuard rather than colonoscopy, so Cologuard order was placed.  Patient has not received a mammogram in several years and was counseled on the importance of this.  Referral for mammogram was placed and patient was given handout to call [facility]  breast center.    [name], M.D. 04/13/2018, 12:01 PM PGY-2, [place]

## 2018-04-11 NOTE — Patient Instructions (Addendum)
It was nice seeing you today Allison [name]!  Today your A1c was increased to 8.6 from 7.0, so please work on continuing to eat less sugar and getting some exercise every day.  We are going to check your kidney function and adjust your medications for diabetes, including stopping your glyburide.  I will call you regarding these changes, and I will work on making sure there is a pill rather than an injection that you can take for your diabetes.  I am also getting cholesterol and thyroid levels today, and I will call you if these are abnormal.  Please call to schedule your mammogram, go see your eye doctor, and collect the Cologuard to be dropped off at our clinic.  I would like to see you back in about 3 months to follow-up on your diabetes.  I hope your husband continues to do better.  If you have any questions or concerns, please feel free to call the clinic.   Be well,  Dr. Shan Levans  Exercises To Do While Sitting  Exercises that you do while sitting (chair exercises) can give you many of the same benefits as full exercise. Benefits include strengthening your heart, burning calories, and keeping muscles and joints healthy. Exercise can also improve your mood and help with depression and anxiety. You may benefit from chair exercises if you are unable to do standing exercises because of:  Diabetic foot pain.  Obesity.  Illness.  Arthritis.  Recovery from surgery or injury.  Breathing problems.  Balance problems.  Another type of disability. Before starting chair exercises, check with your health care provider or a physical therapist to find out how much exercise you can tolerate and which exercises are safe for you. If your health care provider approves:  Start out slowly and build up over time. Aim to work up to about 10-20 minutes for each exercise session.  Make exercise part of your daily routine.  Drink water when you exercise. Do not wait until you are thirsty. Drink every  10-15 minutes.  Stop exercising right away if you have pain, nausea, shortness of breath, or dizziness.  If you are exercising in a wheelchair, make sure to lock the wheels.  Ask your health care provider whether you can do tai chi or yoga. Many positions in these mind-body exercises can be modified to do while seated. Warm-up Before starting other exercises: 1. Sit up as straight as you can. Have your knees bent at 90 degrees, which is the shape of the capital letter "L." Keep your feet flat on the floor. 2. Sit at the front edge of your chair, if you can. 3. Pull in (tighten) the muscles in your abdomen and stretch your spine and neck as straight as you can. Hold this position for a few minutes. 4. Breathe in and out evenly. Try to concentrate on your breathing, and relax your mind. Stretching Exercise A: Arm stretch 1. Hold your arms out straight in front of your body. 2. Bend your hands at the wrist with your fingers pointing up, as if signaling someone to stop. Notice the slight tension in your forearms as you hold the position. 3. Keeping your arms out and your hands bent, rotate your hands outward as far as you can and hold this stretch. Aim to have your thumbs pointing up and your pinkie fingers pointing down. Slowly repeat arm stretches for one minute as tolerated. Exercise B: Leg stretch 1. If you can move your legs, try to "draw"  letters on the floor with the toes of your foot. Write your name with one foot. 2. Write your name with the toes of your other foot. Slowly repeat the movements for one minute as tolerated. Exercise C: Reach for the sky 1. Reach your hands as far over your head as you can to stretch your spine. 2. Move your hands and arms as if you are climbing a rope. Slowly repeat the movements for one minute as tolerated. Range of motion exercises Exercise A: Shoulder roll 1. Let your arms hang loosely at your sides. 2. Lift just your shoulders up toward your  ears, then let them relax back down. 3. When your shoulders feel loose, rotate your shoulders in backward and forward circles. Do shoulder rolls slowly for one minute as tolerated. Exercise B: March in place 1. As if you are marching, pump your arms and lift your legs up and down. Lift your knees as high as you can. ? If you are unable to lift your knees, just pump your arms and move your ankles and feet up and down. March in place for one minute as tolerated. Exercise C: Seated jumping jacks 1. Let your arms hang down straight. 2. Keeping your arms straight, lift them up over your head. Aim to point your fingers to the ceiling. 3. While you lift your arms, straighten your legs and slide your heels along the floor to your sides, as wide as you can. 4. As you bring your arms back down to your sides, slide your legs back together. ? If you are unable to use your legs, just move your arms. Slowly repeat seated jumping jacks for one minute as tolerated. Strengthening exercises Exercise A: Shoulder squeeze 1. Hold your arms straight out from your body to your sides, with your elbows bent and your fists pointed at the ceiling. 2. Keeping your arms in the bent position, move them forward so your elbows and forearms meet in front of your face. 3. Open your arms back out as wide as you can with your elbows still bent, until you feel your shoulder blades squeezing together. Hold for 5 seconds. Slowly repeat the movements forward and backward for one minute as tolerated. Contact a health care provider if you:  Had to stop exercising due to any of the following: ? Pain. ? Nausea. ? Shortness of breath. ? Dizziness. ? Fatigue.  Have significant pain or soreness after exercising. Get help right away if you have:  Chest pain.  Difficulty breathing. These symptoms may represent a serious problem that is an emergency. Do not wait to see if the symptoms will go away. Get medical help right away.  Call your local emergency services (911 in the [place] Do not drive yourself to the hospital. This information is not intended to replace advice given to you by your health care provider. Make sure you discuss any questions you have with your health care provider. Document Released: 01/30/2017 Document Revised: 01/30/2017 Document Reviewed: 01/30/2017 Elsevier Interactive Patient Education  2019 [employer].

## 2018-04-12 LAB — LIPID PANEL
Chol/HDL Ratio: 3.8 ratio (ref 0.0–4.4)
Cholesterol, Total: 175 mg/dL (ref 100–199)
HDL: 46 mg/dL (ref >39)
LDL Calculated: 87 mg/dL (ref 0–99)
Triglycerides: 209 mg/dL — ABNORMAL HIGH (ref 0–149)
VLDL Cholesterol Cal: 42 mg/dL — ABNORMAL HIGH (ref 5–40)

## 2018-04-12 LAB — BASIC METABOLIC PANEL
BUN/Creatinine Ratio: 13 (ref 12–28)
BUN: 19 mg/dL (ref 8–27)
CO2: 24 mmol/L (ref 20–29)
Calcium: 9.2 mg/dL (ref 8.7–10.3)
Chloride: 95 mmol/L — ABNORMAL LOW (ref 96–106)
Creatinine, Ser: 1.41 mg/dL — ABNORMAL HIGH (ref 0.57–1.00)
GFR calc Af Amer: 46 mL/min/{1.73_m2} — ABNORMAL LOW (ref >59)
GFR calc non Af Amer: 40 mL/min/{1.73_m2} — ABNORMAL LOW (ref >59)
GLUCOSE: 198 mg/dL — AB (ref 65–99)
Potassium: 3.8 mmol/L (ref 3.5–5.2)
Sodium: 144 mmol/L (ref 134–144)

## 2018-04-12 LAB — TSH: TSH: 3.22 u[IU]/mL (ref 0.450–4.500)

## 2018-04-13 DIAGNOSIS — Z0000 Encounter for general adult medical examination without abnormal findings: Secondary | ICD-10-CM | POA: Insufficient documentation

## 2018-04-13 NOTE — Assessment & Plan Note (Addendum)
A1c today is 8.6, elevated from 7.0 1 year ago.  Discussed with patient that while she should continue her metformin as prescribed, glyburide is a third line medication, and we can likely switch her to a GLP-1 or SGLT2 inhibitor.  Patient is reticent to start an injectable medication, so an SGLT2 may work better for her, but given her CKD with last GFR of 38, we will wait to make any changes until we get a BMP.  We will also obtain a lipid panel today.  We agreed that I would call her with her results and discuss any medication changes at that time.  Discussed lifestyle modifications with patient as well.  She seems determined to improve her glycemic control.  I would like to see her back in 3 months.

## 2018-04-13 NOTE — Assessment & Plan Note (Signed)
Will obtain TSH today.  Patient is asymptomatic and continues to take levothyroxine 100 mcg daily.

## 2018-04-13 NOTE — Assessment & Plan Note (Signed)
Patient would like to try Cologuard rather than colonoscopy, so Cologuard order was placed.  Patient has not received a mammogram in several years and was counseled on the importance of this.  Referral for mammogram was placed and patient was given handout to call [facility] breast center.

## 2018-04-17 ENCOUNTER — Telehealth: Payer: Self-pay | Admitting: Family Medicine

## 2018-04-17 NOTE — Telephone Encounter (Signed)
Discussed with patient that her BMP showed that her GFR is 40, which is high enough for [place] to start Jardiance if she is amenable to this change.  We would be stopping glyburide in order to start this medication.  I reviewed with patient that glyburide is considered a third line medication because it does not work as well over time and can cause dangerous hypoglycemia.  I told her that Vania Rea is protective of the kidneys and the heart but does have the side effects of increased UTIs and yeast infections.  Patient is currently unsure about this change and would like to think about it.  She will call the clinic and let us know what her preferences and I will follow her wishes.

## 2018-04-25 ENCOUNTER — Other Ambulatory Visit: Payer: Self-pay | Admitting: Family Medicine

## 2018-04-25 DIAGNOSIS — E119 Type 2 diabetes mellitus without complications: Secondary | ICD-10-CM

## 2018-06-23 ENCOUNTER — Other Ambulatory Visit: Payer: Self-pay | Admitting: Family Medicine

## 2018-06-23 DIAGNOSIS — E119 Type 2 diabetes mellitus without complications: Secondary | ICD-10-CM

## 2018-07-20 ENCOUNTER — Other Ambulatory Visit: Payer: Self-pay | Admitting: Family Medicine

## 2018-07-20 DIAGNOSIS — E119 Type 2 diabetes mellitus without complications: Secondary | ICD-10-CM

## 2018-08-12 ENCOUNTER — Other Ambulatory Visit: Payer: Self-pay

## 2018-08-12 MED ORDER — HYDROCHLOROTHIAZIDE 12.5 MG PO TABS: 12.5000 mg | ORAL_TABLET | Freq: Every day | ORAL | 3 refills | Status: DC

## 2018-08-17 ENCOUNTER — Other Ambulatory Visit: Payer: Self-pay | Admitting: Family Medicine

## 2018-09-01 ENCOUNTER — Other Ambulatory Visit: Payer: Self-pay | Admitting: Family Medicine

## 2018-09-01 ENCOUNTER — Other Ambulatory Visit: Payer: Self-pay

## 2018-09-01 DIAGNOSIS — E119 Type 2 diabetes mellitus without complications: Secondary | ICD-10-CM

## 2018-09-01 NOTE — Telephone Encounter (Signed)
Lisinopril/HCTZ has been requested by the pharmacy. Did not see on the med list.

## 2018-09-01 NOTE — Telephone Encounter (Signed)
Opened in error

## 2018-09-02 NOTE — Telephone Encounter (Signed)
Patient will need to see me for an appointment before I will consider prescribing this medication.  She would benefit from being seen about her diabetes anyway.  If you would call her to let her know about this and schedule an appointment when she is able, I would appreciate it.

## 2018-09-03 ENCOUNTER — Other Ambulatory Visit: Payer: Self-pay

## 2018-09-04 NOTE — Telephone Encounter (Signed)
LVM to call office to inform pt of below and to assist in getting appointment scheduled.Reakwon Barren Zimmerman Rumple, CMA

## 2018-09-09 NOTE — Telephone Encounter (Signed)
LVM to call office back to inform her of below and assist in getting an appointment scheduled.Allison [name], CMA

## 2019-01-05 ENCOUNTER — Other Ambulatory Visit: Payer: Self-pay

## 2019-01-05 MED ORDER — ALBUTEROL SULFATE HFA 108 (90 BASE) MCG/ACT IN AERS: INHALATION_SPRAY | RESPIRATORY_TRACT | 0 refills | Status: DC

## 2019-04-01 ENCOUNTER — Other Ambulatory Visit: Payer: Self-pay | Admitting: Family Medicine

## 2019-04-01 DIAGNOSIS — F411 Generalized anxiety disorder: Secondary | ICD-10-CM

## 2019-04-01 DIAGNOSIS — E119 Type 2 diabetes mellitus without complications: Secondary | ICD-10-CM

## 2019-04-06 ENCOUNTER — Telehealth: Payer: Self-pay | Admitting: Family Medicine

## 2019-04-06 NOTE — Telephone Encounter (Signed)
Would you call Ms. Amsberry and see if she would be able to come in for an appointment with me to discuss her diabetes and her medications?  I have not seen her in a year.  If she does not feel comfortable coming in, we can also do a virtual visit.  Thanks.

## 2019-04-07 ENCOUNTER — Other Ambulatory Visit: Payer: Self-pay

## 2019-04-07 NOTE — Telephone Encounter (Signed)
Contacted pt and informed her of below and she stated that she is waiting to get her insurance information.  She said she should be getting it anytime.  She also said that she went to [facility] on Sunday and was diagnosed with shingles and she is taking prednisone. Routing to PCP as a FYI. Conny Moening Zimmerman Rumple, CMA

## 2019-04-08 MED ORDER — PRAVASTATIN SODIUM 40 MG PO TABS: 40.0000 mg | ORAL_TABLET | Freq: Every day | ORAL | 1 refills | Status: DC

## 2019-06-29 ENCOUNTER — Other Ambulatory Visit: Payer: Self-pay | Admitting: Family Medicine

## 2019-06-29 ENCOUNTER — Telehealth: Payer: Self-pay | Admitting: Family Medicine

## 2019-06-29 DIAGNOSIS — E119 Type 2 diabetes mellitus without complications: Secondary | ICD-10-CM

## 2019-06-29 NOTE — Telephone Encounter (Signed)
Would you call Ms. Allison [name] and schedule her for an appointment to follow-up on her diabetes?  She has not seen me in over a year.  I have refilled her Metformin for 1 month to give her enough medication before her appointment.  Thanks.

## 2019-06-30 ENCOUNTER — Other Ambulatory Visit: Payer: Self-pay

## 2019-06-30 MED ORDER — ALBUTEROL SULFATE HFA 108 (90 BASE) MCG/ACT IN AERS: INHALATION_SPRAY | RESPIRATORY_TRACT | 0 refills | Status: DC

## 2019-06-30 NOTE — Telephone Encounter (Signed)
Spoke to pt. Scheduled her for 4/15 @ 3:50. Pt scared (because of COVID) to come in but knows she has to. (FYI.Marland Kitchen Pt mother is dying, father died last 14-Feb-2023. Having a hard time right now.) Ottis Stain, CMA

## 2019-07-16 ENCOUNTER — Ambulatory Visit (INDEPENDENT_AMBULATORY_CARE_PROVIDER_SITE_OTHER): Payer: 59 | Admitting: Family Medicine

## 2019-07-16 ENCOUNTER — Encounter: Payer: Self-pay | Admitting: Family Medicine

## 2019-07-16 ENCOUNTER — Telehealth: Payer: Self-pay | Admitting: Family Medicine

## 2019-07-16 ENCOUNTER — Other Ambulatory Visit: Payer: Self-pay

## 2019-07-16 VITALS — BP 138/84 | HR 108 | Ht 66.0 in | Wt 242.4 lb

## 2019-07-16 DIAGNOSIS — Z1231 Encounter for screening mammogram for malignant neoplasm of breast: Secondary | ICD-10-CM | POA: Diagnosis not present

## 2019-07-16 DIAGNOSIS — I10 Essential (primary) hypertension: Secondary | ICD-10-CM

## 2019-07-16 DIAGNOSIS — E118 Type 2 diabetes mellitus with unspecified complications: Secondary | ICD-10-CM

## 2019-07-16 DIAGNOSIS — E890 Postprocedural hypothyroidism: Secondary | ICD-10-CM | POA: Diagnosis not present

## 2019-07-16 DIAGNOSIS — Z1239 Encounter for other screening for malignant neoplasm of breast: Secondary | ICD-10-CM | POA: Insufficient documentation

## 2019-07-16 DIAGNOSIS — E119 Type 2 diabetes mellitus without complications: Secondary | ICD-10-CM

## 2019-07-16 LAB — POCT GLYCOSYLATED HEMOGLOBIN (HGB A1C): HbA1c, POC (controlled diabetic range): 10.5 % — AB (ref 0.0–7.0)

## 2019-07-16 MED ORDER — LISINOPRIL-HYDROCHLOROTHIAZIDE 10-12.5 MG PO TABS: 1.0000 | ORAL_TABLET | Freq: Every day | ORAL | 3 refills | Status: DC

## 2019-07-16 MED ORDER — CANAGLIFLOZIN 300 MG PO TABS: 300.0000 mg | ORAL_TABLET | Freq: Every day | ORAL | 11 refills | Status: DC

## 2019-07-16 MED ORDER — PRAVASTATIN SODIUM 40 MG PO TABS: 40.0000 mg | ORAL_TABLET | Freq: Every day | ORAL | 1 refills | Status: DC

## 2019-07-16 MED ORDER — METFORMIN HCL 1000 MG PO TABS: 1000.0000 mg | ORAL_TABLET | Freq: Two times a day (BID) | ORAL | 11 refills | Status: DC

## 2019-07-16 MED ORDER — LEVOTHYROXINE SODIUM 100 MCG PO TABS: 100.0000 ug | ORAL_TABLET | Freq: Every day | ORAL | 3 refills | Status: DC

## 2019-07-16 MED ORDER — CALCIUM CARBONATE 1500 (600 CA) MG PO TABS: 1500.0000 mg | ORAL_TABLET | Freq: Two times a day (BID) | ORAL | 3 refills | Status: DC

## 2019-07-16 NOTE — Assessment & Plan Note (Signed)
Patient is taking her levothyroxine as prescribed.  Will obtain a TSH today.

## 2019-07-16 NOTE — Patient Instructions (Signed)
It was nice meeting you today Allison [name]!  We are stopping the glyburide and starting a medication called Invokana today.  I would like for you to cut the medicine in half for the first month and if you are tolerating it well by the 1 month mark, you can take a whole pill once per day after that.  I will be calling in a new glucose meter for you so that you can take your blood sugar once per day, preferably before you eat breakfast.  I highly recommend that you schedule an appointment to get the [place] return of vaccines.  You can go to [email or web address] to schedule your appointment.  Please consider getting a mammogram later this year when you feel ready.  We are getting some labs today.  I will be in touch regarding these results.  If you have any questions or concerns, please feel free to call the clinic.   Be well,  Dr. Shan Levans

## 2019-07-16 NOTE — Assessment & Plan Note (Signed)
Hemoglobin A1c today is 10.5, dictating worsening control in the past year.  A1c was 8.6 one year ago.  We discussed the importance of controlling her diabetes to prevent health consequences.  We decided to stop her glyburide and to start Invokana 300 mg.  Since it appears that her insurance covers the higher dose only, she will plan on cutting the tablet in half for the first month and then taking the whole tablet if she has no concerning side effects for the first month.  We also obtained a BMP and lipid panel today.  I would like to follow-up with her virtually in 1 month.

## 2019-07-16 NOTE — Progress Notes (Deleted)
    SUBJECTIVE:   CHIEF COMPLAINT / HPI:   ***  PERTINENT  PMH / PSH: ***  OBJECTIVE:   BP (!) 144/78   Pulse (!) 108   Ht 5\' 6"  (1.676 m)   Wt 242 lb 6.4 oz (110 kg)   SpO2 99%   BMI 39.12 kg/m   ***  ASSESSMENT/PLAN:   No problem-specific Assessment & Plan notes found for this encounter.     [place]

## 2019-07-16 NOTE — Telephone Encounter (Signed)
Would you call the patient and ask her to make a virtual appointment with me for about 1 month?  I would like to see how she is doing with her diabetes and new medicine at that time.  Thank you!

## 2019-07-16 NOTE — Assessment & Plan Note (Signed)
Pressure is borderline today.  Due to patient's concurrent diabetes, we decided to start lisinopril-HCTZ 10-12.5 mg.  She has tolerated this medication well in the past.

## 2019-07-16 NOTE — Assessment & Plan Note (Signed)
Ordered a mammogram today and gave patient the number for [facility] imaging, although she may feel uncomfortable getting this done until she is either vaccinated for Covid or the pandemic improves.  I discussed the science behind the vaccines and the low risk of side effects with the patient and encouraged her to consider getting the vaccine especially given her comorbid conditions.

## 2019-07-16 NOTE — Progress Notes (Signed)
    SUBJECTIVE:   CHIEF COMPLAINT / HPI:   Grief reaction Patient reports that she has been struggling recently because her mother and father both passed away late last year.  Her husband's health is also very poor.  She has been tearful every day.  Also, due to Covid, she has not left home very much.  She is also anxious about getting the vaccine but also anxious about not getting the vaccine.  She says that her diet has suffered due to her stress and she believes that her diabetes is likely poorly controlled.  Type 2 Diabetes Mellitus: CBGs: does not check Medications: Metformin 1000 mg twice daily, glyburide 5 mg once daily Hypo/hyperglycemic symptoms: None Diet and exercise: Has not been watching her diet or activity level Reports that her current medical insurance does not pay for eye exams   PERTINENT  PMH / PSH: Type 2 diabetes, hypertension, hyperlipidemia, hypothyroidism  OBJECTIVE:   BP 138/84   Pulse (!) 108   Ht 5\' 6"  (1.676 m)   Wt 242 lb 6.4 oz (110 kg)   SpO2 99%   BMI 39.12 kg/m   General: well appearing, appears stated age Cardiac: Slightly tachycardic with regular rhythm, no MRG Respiratory: CTAB, no rhonchi, rales, or wheezing, normal work of breathing Skin: no rashes or other lesions, warm and well perfused Psych: Depressed mood and affect  Diabetic Foot Exam - Simple   Simple Foot Form Diabetic Foot exam was performed with the following findings: Yes 07/16/2019  5:31 PM  Visual Inspection No deformities, no ulcerations, no other skin breakdown bilaterally: Yes Sensation Testing Intact to touch and monofilament testing bilaterally: Yes Pulse Check Posterior Tibialis and Dorsalis pulse intact bilaterally: Yes Comments     ASSESSMENT/PLAN:   Type II diabetes mellitus with complication Hemoglobin 123456 today is 10.5, dictating worsening control in the past year.  A1c was 8.6 one year ago.  We discussed the importance of controlling her diabetes to  prevent health consequences.  We decided to stop her glyburide and to start Invokana 300 mg.  Since it appears that her insurance covers the higher dose only, she will plan on cutting the tablet in half for the first month and then taking the whole tablet if she has no concerning side effects for the first month.  We also obtained a BMP and lipid panel today.  I would like to follow-up with her virtually in 1 month.  Hypothyroid Patient is taking her levothyroxine as prescribed.  Will obtain a TSH today.  Encounter for screening mammogram for malignant neoplasm of breast Ordered a mammogram today and gave patient the number for [facility] imaging, although she may feel uncomfortable getting this done until she is either vaccinated for Covid or the pandemic improves.  I discussed the science behind the vaccines and the low risk of side effects with the patient and encouraged her to consider getting the vaccine especially given her comorbid conditions.  HYPERTENSION, BENIGN SYSTEMIC Pressure is borderline today.  Due to patient's concurrent diabetes, we decided to start lisinopril-HCTZ 10-12.5 mg.  She has tolerated this medication well in the past.     [name], MD [place]

## 2019-07-17 LAB — LIPID PANEL
Chol/HDL Ratio: 3.6 ratio (ref 0.0–4.4)
Cholesterol, Total: 173 mg/dL (ref 100–199)
HDL: 48 mg/dL (ref >39)
LDL Chol Calc (NIH): 87 mg/dL (ref 0–99)
Triglycerides: 230 mg/dL — ABNORMAL HIGH (ref 0–149)
VLDL Cholesterol Cal: 38 mg/dL (ref 5–40)

## 2019-07-17 LAB — BASIC METABOLIC PANEL
BUN/Creatinine Ratio: 13 (ref 12–28)
BUN: 21 mg/dL (ref 8–27)
CO2: 28 mmol/L (ref 20–29)
Calcium: 9.3 mg/dL (ref 8.7–10.3)
Chloride: 96 mmol/L (ref 96–106)
Creatinine, Ser: 1.56 mg/dL — ABNORMAL HIGH (ref 0.57–1.00)
GFR calc Af Amer: 40 mL/min/{1.73_m2} — ABNORMAL LOW (ref >59)
GFR calc non Af Amer: 35 mL/min/{1.73_m2} — ABNORMAL LOW (ref >59)
Glucose: 220 mg/dL — ABNORMAL HIGH (ref 65–99)
Potassium: 4.7 mmol/L (ref 3.5–5.2)
Sodium: 141 mmol/L (ref 134–144)

## 2019-07-17 LAB — TSH: TSH: 6 u[IU]/mL — ABNORMAL HIGH (ref 0.450–4.500)

## 2019-07-20 NOTE — Telephone Encounter (Signed)
Spoke with pt. She does not want to take the new medicine. She would like to go back on the Glyburide that she took in the past. Pt stated that she already has some itching and doesn't want to take a chance on that getting worse.  I tried to schedule pt with a virtual appt but she refused. Pt said I was just there last week. I told pt this would be over the phone and she asked if I could send Dr. Shan Levans a message and see if she would send in the Glyburide. Ottis Stain, CMA

## 2019-07-21 ENCOUNTER — Other Ambulatory Visit: Payer: Self-pay | Admitting: Family Medicine

## 2019-07-21 MED ORDER — GLYBURIDE 5 MG PO TABS: 10.0000 mg | ORAL_TABLET | Freq: Every day | ORAL | 3 refills | Status: DC

## 2019-07-21 NOTE — Telephone Encounter (Signed)
I will send in the glyburide and stop the Jardiance at the patient's request.  She will need to return for her follow-up appointment in 3 months.  Thank you.

## 2019-07-21 NOTE — Telephone Encounter (Signed)
Pt informed of below.Allison [name], CMA ? ?

## 2019-07-21 NOTE — Telephone Encounter (Signed)
Correction-I will stop the Invokana rather than the Jardiance.  Vania Rea was never prescribed due to insurance preference.

## 2019-07-22 ENCOUNTER — Other Ambulatory Visit: Payer: Self-pay | Admitting: Family Medicine

## 2019-07-22 ENCOUNTER — Telehealth: Payer: Self-pay | Admitting: Family Medicine

## 2019-07-22 DIAGNOSIS — F411 Generalized anxiety disorder: Secondary | ICD-10-CM

## 2019-07-22 MED ORDER — ALPRAZOLAM 0.5 MG PO TABS: 0.5000 mg | ORAL_TABLET | Freq: Every day | ORAL | 0 refills | Status: DC | PRN

## 2019-07-22 MED ORDER — ROSUVASTATIN CALCIUM 20 MG PO TABS: 20.0000 mg | ORAL_TABLET | Freq: Every day | ORAL | 11 refills | Status: DC

## 2019-07-22 MED ORDER — LEVOTHYROXINE SODIUM 112 MCG PO TABS: 112.0000 ug | ORAL_TABLET | Freq: Every day | ORAL | 11 refills | Status: DC

## 2019-07-22 NOTE — Telephone Encounter (Signed)
I spoke with Ms. Siron regarding her lab results and plan going forward for her diabetes.  Her LDL is a little higher than goal, so we will switch her pravastatin to rosuvastatin 20 mg.  Her TSH is slightly high, and she says that she is taking her medication as prescribed, so we will increase her levothyroxine from 100 mcg to 112 mcg.  I have also refilled her Xanax at her request after checking the controlled substances database.  We discussed that we know that her glyburide is not being effective in treating her diabetes.  The best medication for her would be a GLP-1 agonist due to her CKD and worsening diabetes, but she is concerned about the cost of this medication as well as any potential side effects and the necessity of giving herself shot.  I told her that we can bring her in to have our pharmacy team help her learn how to administer this medication, and we also have samples in our clinic if we need to use them due to cost.  She will consider this option and let me know if she would like to try this medication.  We also decided that she will call and make an appointment to have a virtual visit with me when she is ready in about a month.  She also requested a refill of her Metformin, but this medication was refilled at her clinic appointment with me on 4/15

## 2019-09-23 ENCOUNTER — Other Ambulatory Visit: Payer: Self-pay | Admitting: *Deleted

## 2019-09-24 MED ORDER — ALBUTEROL SULFATE HFA 108 (90 BASE) MCG/ACT IN AERS: INHALATION_SPRAY | RESPIRATORY_TRACT | 0 refills | Status: DC

## 2020-01-12 ENCOUNTER — Ambulatory Visit (HOSPITAL_COMMUNITY)
Admission: RE | Admit: 2020-01-12 | Discharge: 2020-01-12 | Disposition: A | Discharge status: Home / Self Care | Payer: 59 | Source: Ambulatory Visit

## 2020-01-12 ENCOUNTER — Ambulatory Visit (HOSPITAL_COMMUNITY)
Admission: RE | Admit: 2020-01-12 | Discharge: 2020-01-12 | Disposition: A | Discharge status: Home / Self Care | Payer: 59 | Source: Ambulatory Visit | Attending: Family Medicine | Admitting: Family Medicine

## 2020-01-12 ENCOUNTER — Encounter: Payer: Self-pay | Admitting: Family Medicine

## 2020-01-12 ENCOUNTER — Ambulatory Visit (INDEPENDENT_AMBULATORY_CARE_PROVIDER_SITE_OTHER): Payer: 59 | Admitting: Family Medicine

## 2020-01-12 ENCOUNTER — Other Ambulatory Visit: Payer: Self-pay

## 2020-01-12 ENCOUNTER — Ambulatory Visit (HOSPITAL_COMMUNITY): Payer: 59

## 2020-01-12 VITALS — BP 124/78 | HR 121 | Ht 66.0 in | Wt 219.2 lb

## 2020-01-12 DIAGNOSIS — E890 Postprocedural hypothyroidism: Secondary | ICD-10-CM

## 2020-01-12 DIAGNOSIS — R079 Chest pain, unspecified: Secondary | ICD-10-CM | POA: Insufficient documentation

## 2020-01-12 DIAGNOSIS — R5383 Other fatigue: Secondary | ICD-10-CM | POA: Insufficient documentation

## 2020-01-12 DIAGNOSIS — N3000 Acute cystitis without hematuria: Secondary | ICD-10-CM

## 2020-01-12 DIAGNOSIS — Z0000 Encounter for general adult medical examination without abnormal findings: Secondary | ICD-10-CM

## 2020-01-12 DIAGNOSIS — R300 Dysuria: Secondary | ICD-10-CM | POA: Diagnosis not present

## 2020-01-12 DIAGNOSIS — E118 Type 2 diabetes mellitus with unspecified complications: Secondary | ICD-10-CM

## 2020-01-12 LAB — POCT URINALYSIS DIP (MANUAL ENTRY)
Bilirubin, UA: NEGATIVE
Glucose, UA: NEGATIVE mg/dL
Ketones, POC UA: NEGATIVE mg/dL
Nitrite, UA: NEGATIVE
Protein Ur, POC: NEGATIVE mg/dL
Spec Grav, UA: 1.01 (ref 1.010–1.025)
Urobilinogen, UA: 0.2 E.U./dL
pH, UA: 6.5 (ref 5.0–8.0)

## 2020-01-12 LAB — POCT GLYCOSYLATED HEMOGLOBIN (HGB A1C): HbA1c, POC (controlled diabetic range): 6.3 % (ref 0.0–7.0)

## 2020-01-12 LAB — POCT UA - MICROSCOPIC ONLY

## 2020-01-12 LAB — GLUCOSE, POCT (MANUAL RESULT ENTRY): POC Glucose: 163 mg/dl — AB (ref 70–99)

## 2020-01-12 MED ORDER — CEPHALEXIN 500 MG PO CAPS: 500.0000 mg | ORAL_CAPSULE | Freq: Two times a day (BID) | ORAL | 0 refills | Status: AC

## 2020-01-12 MED ORDER — LEVOTHYROXINE SODIUM 100 MCG PO TABS: 100.0000 ug | ORAL_TABLET | Freq: Every day | ORAL | 0 refills | Status: DC

## 2020-01-12 NOTE — Patient Instructions (Addendum)
Great to see you today Mrs. Allison [name]!!  Sorry that you have been feeling unwell for the last few weeks I think this is because of a urine infection. I have prescribed you kelfex 500mg  twice a day which you can take for the next 7 days.  I am running some tests such as a full blood count, thyroid studies and anemia panel given that you have been tired. I will contact you if the results are abnormal. Please follow-up with me in the next in early November.  Diabetes is very well controlled continu on current medications. We can consider switching the glyburide to another medicine.  Best wishes,  Dr. Posey Pronto

## 2020-01-12 NOTE — Assessment & Plan Note (Deleted)
Placed GI referral for colonoscopy.

## 2020-01-12 NOTE — Progress Notes (Signed)
SUBJECTIVE:   CHIEF COMPLAINT / HPI:   Allison [name] is a 64 yr old female who presents today for diabetes follow up  Diabetes Last A1c was 10.5, denies polyuria, polydipsia, fatigue, blurred vision.  Currently takes Metformin 1000 mg twice daily and glyburide mg BID. Discussed with Dr. Shan Levans at previous visit regarding switching to GLP-1 agonist however patient had trouble affording this medication.  Denies side effects to Metformin and is tolerating medication well.  Pt is trying to lose weight, lost over >10kg since April. Has diabetic eye doctor but has not seen him this year yet.   Hyperlipidemia Patient takes rosuvastatin 20 mg daily.  Tolerating well without side effects.  Denies right upper quadrant pain and myalgias.   Hypothyroidism Last TSH 6. Takes Synthroid 142mcg. Did not know the dose was increased to 112 mcg at the last visit.   Fatigue Over the last 3 to 4 weeks has felt tired with associated sinus drainage. Other symptoms include productive cough, central chest pain (not related to exertion), dizziness dyspnea and weakness. She feels as though her arms and legs are going to drop off. She feels like she has a pneumonia and is very worried. Several years ago she was admitted to hospital with pneumonia and developed pulmonary emboli and was on warfarin.  Healthcare maintenance Colonoscopy: Patient sent Cologuard at previous visit.  Has never had a screening colonoscopy. Declined at this visit. Covid vaccine: Has had both Flu vaccine: last month  Urinary symptoms Endorses frequency for the past few weeks. Denies dysuria, hematuria, fevers, chills, back pain or abdominal pain.  PERTINENT  PMH / PSH: Diabetes, hypertension, hypothyroidism, hx of PE  OBJECTIVE:   BP 124/78    Pulse (!) 121    Ht 5\' 6"  (1.676 m)    Wt 219 lb 3.2 oz (99.4 kg)    SpO2 98%    BMI 35.38 kg/m    General: Alert, pleasant no acute distress Cardio: Normal S1 and S2, RRR Pulm:  CTAB,  normal work of breathing Abdomen: Bowel sounds normal. Abdomen soft and non-tender. No renal angle tenderness  Extremities: No peripheral edema. Neuro: Cranial nerves grossly intact  ASSESSMENT/PLAN:   HYPERLIPIDEMIA Lipid panel obtained at last visit, wnl except TG 230. Continue rosuvastatin 20 mg. Diet and exercise counseling provided.  Fatigue Fatigue is likely multifactorial. It is most likely due to current UTI and sinusitis. Pt did have TSH 6 earlier this year and did not increase take increased dose of Synthroid. Will obtained TSH and change Synthroid dose accordingly. Will obtain CBC with diff and anemia panel as she may be anemic. BMP already obtained in April. CBG 163. Pt also appears flat and depressed today which could be contributing to her sx but PHQ 9 3. Has tried antidepressants in the past. Will follow up pt in 2 weeks and see if improvement in sx after resolution of infection.   UTI (urinary tract infection) UA shows trace Hb and moderate leuks and pt is symptomatic. No renal angle tenderness or fevers, pyelonepritis unlikely. Treated with 7 days of Keflex. Urine sent for culture. If negative will d/c antibiotics.   Type II diabetes mellitus with complication L7L 8.9% improved from 10.5  Pt has improved diet significantly since this visit and lost > 20lb since then. Congratulated patient on her efforts and told her to keep up the great work. Discussed stopping Glyburide and switching to Jardiance which would be covered under her insurance but pt declined and is  worried about Jardiance causing UTIs. I explained that sulfonylureas can cause hypoglycemia and SGTL2 inhibitors are renal and cardioprotective in pts with diabetes and the preferred drug of choice however she declined.  Will continue Metformin 1000mg  BID and Glyburide 5mg  BID for now. F/u in 3 months.  Hypothyroid TSH wnl. Continue Synthroid 120mcg daily.      Allison Haw, MD PGY-2 [place]

## 2020-01-13 ENCOUNTER — Telehealth: Payer: Self-pay | Admitting: Family Medicine

## 2020-01-13 ENCOUNTER — Other Ambulatory Visit: Payer: Self-pay | Admitting: Family Medicine

## 2020-01-13 DIAGNOSIS — F411 Generalized anxiety disorder: Secondary | ICD-10-CM

## 2020-01-13 DIAGNOSIS — R5383 Other fatigue: Secondary | ICD-10-CM | POA: Insufficient documentation

## 2020-01-13 LAB — ANEMIA PANEL
Ferritin: 58 ng/mL (ref 15–150)
Folate, Hemolysate: 341 ng/mL
Folate, RBC: 806 ng/mL (ref >498)
Hematocrit: 42.3 % (ref 34.0–46.6)
Iron Saturation: 13 % — ABNORMAL LOW (ref 15–55)
Iron: 50 ug/dL (ref 27–139)
Retic Ct Pct: 1.4 % (ref 0.6–2.6)
Total Iron Binding Capacity: 385 ug/dL (ref 250–450)
UIBC: 335 ug/dL (ref 118–369)
Vitamin B-12: 124 pg/mL — ABNORMAL LOW (ref 232–1245)

## 2020-01-13 LAB — CBC WITH DIFFERENTIAL/PLATELET
Basophils Absolute: 0 10*3/uL (ref 0.0–0.2)
Basos: 0 %
EOS (ABSOLUTE): 0.1 10*3/uL (ref 0.0–0.4)
Eos: 1 %
Hemoglobin: 14.6 g/dL (ref 11.1–15.9)
Immature Grans (Abs): 0 10*3/uL (ref 0.0–0.1)
Immature Granulocytes: 0 %
Lymphocytes Absolute: 1.6 10*3/uL (ref 0.7–3.1)
Lymphs: 23 %
MCH: 30.5 pg (ref 26.6–33.0)
MCHC: 34.5 g/dL (ref 31.5–35.7)
MCV: 88 fL (ref 79–97)
Monocytes Absolute: 0.5 10*3/uL (ref 0.1–0.9)
Monocytes: 7 %
Neutrophils Absolute: 4.7 10*3/uL (ref 1.4–7.0)
Neutrophils: 69 %
Platelets: 218 10*3/uL (ref 150–450)
RBC: 4.79 x10E6/uL (ref 3.77–5.28)
RDW: 12.8 % (ref 11.7–15.4)
WBC: 6.9 10*3/uL (ref 3.4–10.8)

## 2020-01-13 LAB — TSH: TSH: 2.03 u[IU]/mL (ref 0.450–4.500)

## 2020-01-13 MED ORDER — ALBUTEROL SULFATE HFA 108 (90 BASE) MCG/ACT IN AERS
INHALATION_SPRAY | RESPIRATORY_TRACT | 0 refills | Status: DC
Start: 2020-01-13 — End: 2021-04-04

## 2020-01-13 MED ORDER — ALPRAZOLAM 0.5 MG PO TABS: 0.5000 mg | ORAL_TABLET | Freq: Every day | ORAL | 0 refills | Status: DC | PRN

## 2020-01-13 MED ORDER — PRAVASTATIN SODIUM 40 MG PO TABS: 40.0000 mg | ORAL_TABLET | Freq: Every evening | ORAL | 11 refills | Status: DC

## 2020-01-13 NOTE — Assessment & Plan Note (Addendum)
Fatigue is likely multifactorial. It is most likely due to current UTI and sinusitis. Pt did have TSH 6 earlier this year and did not increase take increased dose of Synthroid. Will obtained TSH and change Synthroid dose accordingly. Will obtain CBC with diff and anemia panel as she may be anemic. BMP already obtained in April. CBG 163. Pt also appears flat and depressed today which could be contributing to her sx but PHQ 9 3. Has tried antidepressants in the past. Will follow up pt in 2 weeks and see if improvement in sx after resolution of infection.

## 2020-01-13 NOTE — Assessment & Plan Note (Signed)
A1c 6.3% improved from 10.5  Pt has improved diet significantly since this visit and lost > 20lb since then. Congratulated patient on her efforts and told her to keep up the great work. Discussed stopping Glyburide and switching to Jardiance which would be covered under her insurance but pt declined and is worried about Jardiance causing UTIs. I explained that sulfonylureas can cause hypoglycemia and SGTL2 inhibitors are renal and cardioprotective in pts with diabetes and the preferred drug of choice however she declined.  Will continue Metformin 1000mg  BID and Glyburide 5mg  BID for now. F/u in 3 months.

## 2020-01-13 NOTE — Assessment & Plan Note (Signed)
TSH wnl. Continue Synthroid 144mcg daily.

## 2020-01-13 NOTE — Assessment & Plan Note (Signed)
UA shows trace Hb and moderate leuks and pt is symptomatic. No renal angle tenderness or fevers, pyelonepritis unlikely. Treated with 7 days of Keflex. Urine sent for culture. If negative will d/c antibiotics.

## 2020-01-13 NOTE — Assessment & Plan Note (Signed)
Lipid panel obtained at last visit, wnl except TG 230. Continue rosuvastatin 20 mg. Diet and exercise counseling provided.

## 2020-01-13 NOTE — Telephone Encounter (Signed)
Called pt to inform her of labs results from yesterday. Recommended OTC vitamin b12. She would refill for xanax and albuterol. Refills sent to the pharmacy.

## 2020-01-14 LAB — URINE CULTURE

## 2020-01-15 ENCOUNTER — Emergency Department (HOSPITAL_COMMUNITY)
Admission: EM | Admit: 2020-01-15 | Discharge: 2020-01-16 | Disposition: A | Discharge status: Home / Self Care | Payer: 59 | Attending: Emergency Medicine | Admitting: Emergency Medicine

## 2020-01-15 ENCOUNTER — Emergency Department (HOSPITAL_COMMUNITY): Payer: 59

## 2020-01-15 ENCOUNTER — Other Ambulatory Visit: Payer: Self-pay

## 2020-01-15 ENCOUNTER — Encounter (HOSPITAL_COMMUNITY): Payer: Self-pay | Admitting: Emergency Medicine

## 2020-01-15 DIAGNOSIS — E039 Hypothyroidism, unspecified: Secondary | ICD-10-CM | POA: Insufficient documentation

## 2020-01-15 DIAGNOSIS — R079 Chest pain, unspecified: Secondary | ICD-10-CM | POA: Insufficient documentation

## 2020-01-15 DIAGNOSIS — Z20822 Contact with and (suspected) exposure to covid-19: Secondary | ICD-10-CM | POA: Insufficient documentation

## 2020-01-15 DIAGNOSIS — J45909 Unspecified asthma, uncomplicated: Secondary | ICD-10-CM | POA: Diagnosis not present

## 2020-01-15 DIAGNOSIS — Z79890 Hormone replacement therapy (postmenopausal): Secondary | ICD-10-CM | POA: Diagnosis not present

## 2020-01-15 DIAGNOSIS — Z955 Presence of coronary angioplasty implant and graft: Secondary | ICD-10-CM | POA: Insufficient documentation

## 2020-01-15 DIAGNOSIS — E119 Type 2 diabetes mellitus without complications: Secondary | ICD-10-CM | POA: Insufficient documentation

## 2020-01-15 DIAGNOSIS — Z8544 Personal history of malignant neoplasm of other female genital organs: Secondary | ICD-10-CM | POA: Diagnosis not present

## 2020-01-15 DIAGNOSIS — I10 Essential (primary) hypertension: Secondary | ICD-10-CM | POA: Insufficient documentation

## 2020-01-15 DIAGNOSIS — Z7984 Long term (current) use of oral hypoglycemic drugs: Secondary | ICD-10-CM | POA: Diagnosis not present

## 2020-01-15 DIAGNOSIS — R5383 Other fatigue: Secondary | ICD-10-CM | POA: Diagnosis not present

## 2020-01-15 LAB — BASIC METABOLIC PANEL
Anion gap: 16 — ABNORMAL HIGH (ref 5–15)
BUN: 17 mg/dL (ref 8–23)
CO2: 27 mmol/L (ref 22–32)
Calcium: 8.3 mg/dL — ABNORMAL LOW (ref 8.9–10.3)
Chloride: 97 mmol/L — ABNORMAL LOW (ref 98–111)
Creatinine, Ser: 1.22 mg/dL — ABNORMAL HIGH (ref 0.44–1.00)
GFR, Estimated: 47 mL/min — ABNORMAL LOW (ref >60)
Glucose, Bld: 143 mg/dL — ABNORMAL HIGH (ref 70–99)
Potassium: 3.4 mmol/L — ABNORMAL LOW (ref 3.5–5.1)
Sodium: 140 mmol/L (ref 135–145)

## 2020-01-15 LAB — CBC
HCT: 44.7 % (ref 36.0–46.0)
Hemoglobin: 14.5 g/dL (ref 12.0–15.0)
MCH: 29.7 pg (ref 26.0–34.0)
MCHC: 32.4 g/dL (ref 30.0–36.0)
MCV: 91.4 fL (ref 80.0–100.0)
Platelets: 210 10*3/uL (ref 150–400)
RBC: 4.89 MIL/uL (ref 3.87–5.11)
RDW: 13.2 % (ref 11.5–15.5)
WBC: 6.1 10*3/uL (ref 4.0–10.5)
nRBC: 0 % (ref 0.0–0.2)

## 2020-01-15 LAB — TROPONIN I (HIGH SENSITIVITY): Troponin I (High Sensitivity): 3 ng/L (ref <18)

## 2020-01-15 NOTE — ED Triage Notes (Signed)
Patient reports central chest pain radiating to upper back with SOB onset this week , no emesis or diaphoresis , no cough or fever , patient added history of pulmonary embolism .

## 2020-01-16 ENCOUNTER — Emergency Department (HOSPITAL_COMMUNITY): Payer: 59

## 2020-01-16 ENCOUNTER — Other Ambulatory Visit: Payer: Self-pay

## 2020-01-16 ENCOUNTER — Ambulatory Visit (HOSPITAL_COMMUNITY)
Admit: 2020-01-16 | Discharge: 2020-01-16 | Disposition: A | Discharge status: Home / Self Care | Payer: 59 | Attending: Family Medicine | Admitting: Family Medicine

## 2020-01-16 LAB — TROPONIN I (HIGH SENSITIVITY): Troponin I (High Sensitivity): 3 ng/L (ref <18)

## 2020-01-16 LAB — RESPIRATORY PANEL BY RT PCR (FLU A&B, COVID)
Influenza A by PCR: NEGATIVE
Influenza B by PCR: NEGATIVE
SARS Coronavirus 2 by RT PCR: NEGATIVE

## 2020-01-16 MED ORDER — HYDROXYZINE HCL 25 MG PO TABS: 25.0000 mg | ORAL_TABLET | Freq: Four times a day (QID) | ORAL | 0 refills | Status: DC

## 2020-01-16 MED ORDER — OMEPRAZOLE 20 MG PO CPDR: 20.0000 mg | DELAYED_RELEASE_CAPSULE | Freq: Every day | ORAL | 0 refills | Status: DC

## 2020-01-16 MED ORDER — IOHEXOL 350 MG/ML SOLN
75.0000 mL | Freq: Once | INTRAVENOUS | Status: AC | PRN
  Administered 2020-01-16: 75 mL via INTRAVENOUS

## 2020-01-16 NOTE — Discharge Instructions (Addendum)
Your CT scan was negative for any blood clots in your lung.  Please drink plenty of water, eat 3 regular meals per day.  Take your medications as prescribed.  I am also prescribing omeprazole which is a medicine for reflux in case this is contributing to your chest pain.  I also wonder if there could be some anxiety is a component.  I will provide you with a prescription for hydroxyzine as well which can help with anxiety.  Please follow up with cardiologist. Please return to ED for any new or concerning symptoms.

## 2020-01-16 NOTE — ED Provider Notes (Signed)
[facility] EMERGENCY DEPARTMENT Provider Note   CSN: [id number] Arrival date & time: 01/15/20  2129     History Chief Complaint  Patient presents with  . Chest Pain    Allison [name] is a 64 y.o. female.  HPI Patient is a 64 year old female with a history of reflux, pulmonary embolism, HLD, HTN, asthma, arthritis, anxiety, DM 2  Patient is presented today with 1 week of of central chest pain which she describes as as radiated to the back, nonexertional, does not seem to have any aggravating or mitigating factors.  She denies any nausea, vomiting, diaphoresis.  Fevers cough or chills.  She states that it feels very similar to when she had a pulmonary embolism in the past.  She states that she has had some runny nose and postnasal drainage for the past week as well.  She is vaccinated against COVID-19.  She denies any exposure that she knows of.  No other sick contacts that she knows of.  No other associated symptoms.  She is taken nothing prior to arrival in ED for her symptoms.  Notably patient does take Xanax for anxiety.  She states that she does not use this regularly. HPI: A 64 year old patient with a history of treated diabetes, hypertension, hypercholesterolemia and obesity presents for evaluation of chest pain. Initial onset of pain was more than 6 hours ago. The patient's chest pain is not worse with exertion. The patient's chest pain is middle- or left-sided, is not well-localized, is not described as heaviness/pressure/tightness, is not sharp and does not radiate to the arms/jaw/neck. The patient does not complain of nausea and denies diaphoresis. The patient has no history of stroke, has no history of peripheral artery disease, has not smoked in the past 90 days and has no relevant family history of coronary artery disease (first degree relative at less than age 36).   Past Medical History:  Diagnosis Date  . Anxiety   . Arthritis    "knees" (06/04/2016)  .  Asthma    seasonal  . Chronic bronchitis ([place])   . History of gout   . History of kidney stones   . Hyperlipidemia   . Hypertension   . Hypothyroidism   . Pneumonia ~ 2013; 09/2015; 03/2016  . Pulmonary embolism ([place]) ~ 2013   "2 big ones",   . Thyroid goiter   . Type II diabetes mellitus ([place])   . Uterine cancer ([place]) 2000   Hysterectomy  . Ventral hernia     Patient Active Problem List   Diagnosis Date Noted  . Fatigue 01/13/2020  . Encounter for screening mammogram for malignant neoplasm of breast 07/16/2019  . Health care maintenance 04/13/2018  . Hypocalcemia 04/19/2017  . S/P laparoscopic cholecystectomy 06/04/2016  . Chronic kidney disease (CKD), stage III (moderate) ([facility]) 09/12/2015  . Uric acid renal calculus 11/24/2012  . Nephrolithiasis 10/18/2011  . Hypothyroid 10/10/2010  . Venous stasis dermatitis 09/26/2010  . UTI (urinary tract infection) 07/28/2010  . Asthma 01/03/2010  . PROTEINURIA 08/25/2009  . MORBID OBESITY 04/14/2009  . KNEE PAIN, BILATERAL 04/14/2009  . GOITER NOS 05/30/2006  . Type II diabetes mellitus with complication ([place]) 40/98/1191  . HYPERLIPIDEMIA 05/30/2006  . Anxiety state 05/30/2006  . DEPRESSIVE DISORDER, NOS 05/30/2006  . HYPERTENSION, BENIGN SYSTEMIC 05/30/2006  . GERD 04/05/2006  . LOW BACK PAIN 04/05/2006    Past Surgical History:  Procedure Laterality Date  . CHOLECYSTECTOMY N/A 06/04/2016   Procedure: LAPAROSCOPIC CHOLECYSTECTOMY;  Surgeon: Rodman Key  Donne Hazel, MD;  Location: [facility];  Service: General;  Laterality: N/A;  LAPAROSCOPIC CHOLECYSTECTOMY  . CYSTOSCOPY W/ RETROGRADES Bilateral 12/18/2016   Procedure: CYSTOSCOPY WITH RETROGRADE PYELOGRAM;  Surgeon: Nickie Retort, MD;  Location: [facility];  Service: Urology;  Laterality: Bilateral;  . CYSTOSCOPY W/ URETERAL STENT REMOVAL Right 12/18/2016   Procedure: CYSTOSCOPY WITH STENT REMOVAL;  Surgeon: Nickie Retort, MD;  Location: [facility];  Service: Urology;  Laterality: Right;  . CYSTOSCOPY WITH RETROGRADE PYELOGRAM, URETEROSCOPY AND STENT PLACEMENT Right 04/17/2016   Procedure: CYSTOSCOPY WITH RETROGRADE PYELOGRAM, URETEROSCOPY AND STENT PLACEMENT;  Surgeon: Nickie Retort, MD;  Location: [facility];  Service: Urology;  Laterality: Right;  . CYSTOSCOPY/URETEROSCOPY/HOLMIUM LASER/STENT PLACEMENT Bilateral 12/18/2016   Procedure: CYSTOSCOPY/URETEROSCOPY/HOLMIUM LASER/    BILATERAL STENT PLACEMENT;  Surgeon: Nickie Retort, MD;  Location: [facility];  Service: Urology;  Laterality: Bilateral;  . DILATION AND CURETTAGE OF UTERUS     S/P miscarriage  . HOLMIUM LASER APPLICATION Bilateral 5/57/3220   Procedure: HOLMIUM LASER APPLICATION;  Surgeon: Nickie Retort, MD;  Location: [facility];  Service: Urology;  Laterality: Bilateral;  . IR URETERAL STENT RIGHT NEW ACCESS W/O SEP NEPHROSTOMY CATH  11/20/2016  . LAPAROSCOPIC CHOLECYSTECTOMY  06/04/2016  . NEPHROLITHOTOMY Right 11/20/2016   Procedure: NEPHROLITHOTOMY PERCUTANEOUS;  Surgeon: Nickie Retort, MD;  Location: [facility];  Service: Urology;  Laterality: Right;  . TONSILLECTOMY    . TOTAL ABDOMINAL HYSTERECTOMY  2000s   "endometrial CA"  . TOTAL THYROIDECTOMY  2012   "had goiter on it"-one lobe removed     OB History   No obstetric history on file.     Family History  Adopted: Yes  Problem Relation Age of Onset  . CAD Brother 44    Social History   Tobacco Use  . Smoking status: Never Smoker  . Smokeless tobacco: Never Used  Substance Use Topics  . Alcohol use: No  . Drug use: No    Home Medications Prior to Admission medications   Medication Sig Start Date End Date Taking? Authorizing Provider  albuterol (PROAIR HFA) 108 (90 Base) MCG/ACT inhaler USE 2 PUFFS BY MOUTH EVERY  6 HOURS AS NEEDED FOR  WHEEZING OR SHORTNESS OF  BREATH 01/13/20   [name], MD  ALPRAZolam Duanne Moron) 0.5 MG tablet Take 1 tablet (0.5  mg total) by mouth daily as needed for anxiety. for anxiety 01/13/20   [name], MD  calcium carbonate (OSCAL) 1500 (600 Ca) MG TABS tablet Take 1 tablet (1,500 mg total) by mouth 2 (two) times daily with a meal. 07/16/19   Winfrey, Alcario Drought, MD  cephALEXin (KEFLEX) 500 MG capsule Take 1 capsule (500 mg total) by mouth 2 (two) times daily for 7 days. 01/12/20 01/19/20  [name], MD  cetirizine (ZYRTEC) 10 MG tablet Take 1 tablet (10 mg total) by mouth daily. 04/11/18   [name], MD  glyBURIDE (DIABETA) 5 MG tablet Take 2 tablets (10 mg total) by mouth daily with breakfast. 07/21/19   [name], MD  hydrOXYzine (ATARAX/VISTARIL) 25 MG tablet Take 1 tablet (25 mg total) by mouth every 6 (six) hours. 01/16/20   Tedd Sias, PA  levothyroxine (SYNTHROID) 100 MCG tablet Take 1 tablet (100 mcg total) by mouth daily before breakfast. 01/12/20 02/11/20  [name], MD  metFORMIN (GLUCOPHAGE) 1000 MG tablet Take 1 tablet (1,000 mg total) by mouth 2 (two) times daily. 07/16/19  [name], MD  omeprazole (PRILOSEC) 20 MG capsule Take 1 capsule (20 mg total) by mouth daily for 21 days. 01/16/20 02/06/20  Tedd Sias, PA  pravastatin (PRAVACHOL) 40 MG tablet Take 1 tablet (40 mg total) by mouth every evening. 01/13/20 01/12/21  [name], MD  albuterol (PROVENTIL,VENTOLIN) 90 MCG/ACT inhaler Inhale 2 puffs into the lungs every 6 (six) hours as needed. 10/23/10 11/13/11  [place]ique, Sarah T, MD    Allergies    Oxycodone, Simvastatin, and Tramadol hcl  Review of Systems   Review of Systems  Constitutional: Negative for chills and fever.  HENT: Negative for congestion.   Eyes: Negative for pain.  Respiratory: Positive for shortness of breath. Negative for cough.   Cardiovascular: Positive for chest pain. Negative for leg swelling.  Gastrointestinal: Negative for abdominal pain, diarrhea, nausea and vomiting.  Genitourinary: Negative for dysuria.  Musculoskeletal:  Negative for myalgias.  Skin: Negative for rash.  Neurological: Negative for dizziness and headaches.    Physical Exam Updated Vital Signs BP (!) 149/80   Pulse 91   Temp 97.9 F (36.6 C) (Oral)   Resp 16   Ht 5\' 6"  (1.676 m)   Wt 108 kg   SpO2 95%   BMI 38.43 kg/m   Physical Exam Vitals and nursing note reviewed.  Constitutional:      General: She is not in acute distress. HENT:     Head: Normocephalic and atraumatic.     Nose: Nose normal.  Eyes:     General: No scleral icterus. Cardiovascular:     Rate and Rhythm: Normal rate and regular rhythm.     Pulses: Normal pulses.     Heart sounds: Normal heart sounds.  Pulmonary:     Effort: Pulmonary effort is normal. No respiratory distress.     Breath sounds: No wheezing.     Comments: Normal work of breathing, speaking full sentences.  Lungs are clear to auscultation in all fields.  Murmurs rubs or gallops no cardiac exam. Abdominal:     Palpations: Abdomen is soft.     Tenderness: There is no abdominal tenderness.  Musculoskeletal:     Cervical back: Normal range of motion.     Right lower leg: No edema.     Left lower leg: No edema.  Skin:    General: Skin is warm and dry.     Capillary Refill: Capillary refill takes less than 2 seconds.  Neurological:     Mental Status: She is alert. Mental status is at baseline.  Psychiatric:        Mood and Affect: Mood normal.        Behavior: Behavior normal.     ED Results / Procedures / Treatments   Labs (all labs ordered are listed, but only abnormal results are displayed) Labs Reviewed  BASIC METABOLIC PANEL - Abnormal; Notable for the following components:      Result Value   Potassium 3.4 (*)    Chloride 97 (*)    Glucose, Bld 143 (*)    Creatinine, Ser 1.22 (*)    Calcium 8.3 (*)    GFR, Estimated 47 (*)    Anion gap 16 (*)    All other components within normal limits  RESPIRATORY PANEL BY RT PCR (FLU A&B, COVID)  CBC  TROPONIN I (HIGH SENSITIVITY)    TROPONIN I (HIGH SENSITIVITY)    EKG EKG Interpretation  Date/Time:  Saturday January 16 2020 10:00:00 EDT Ventricular Rate:  95 PR  Interval:    QRS Duration: 88 QT Interval:  380 QTC Calculation: 478 R Axis:   -64 Text Interpretation: Sinus rhythm Left anterior fascicular block poor r wave progression, similar to older tracings Confirmed by Charlesetta Shanks [phone]) on 01/16/2020 1:08:56 PM   Radiology DG Chest 2 View  Result Date: 01/15/2020 CLINICAL DATA:  Chest pain EXAM: CHEST - 2 VIEW COMPARISON:  March 09, 2016. FINDINGS: The heart size and mediastinal contours are within normal limits. Both lungs are clear. The visualized skeletal structures are unremarkable. IMPRESSION: No active cardiopulmonary disease. Electronically Signed   By: Prudencio Pair M.D.   On: 01/15/2020 23:02   CT Angio Chest PE W/Cm &/Or Wo Cm  Result Date: 01/16/2020 CLINICAL DATA:  64 year old female with chest pain and shortness of breath, suspected pulmonary embolism. EXAM: CT ANGIOGRAPHY CHEST WITH CONTRAST TECHNIQUE: Multidetector CT imaging of the chest was performed using the standard protocol during bolus administration of intravenous contrast. Multiplanar CT image reconstructions and MIPs were obtained to evaluate the vascular anatomy. CONTRAST:  100 mL 350, intravenous COMPARISON:  05/02/2012 FINDINGS: Cardiovascular: Satisfactory opacification of the pulmonary arteries to the segmental level. No evidence of pulmonary embolism. Normal caliber main pulmonary artery. There are few scattered atherosclerotic calcifications in the aortic arch. Normal heart size. No pericardial effusion. Mediastinum/Nodes: No enlarged mediastinal, hilar, or axillary lymph nodes. Thyroid gland, trachea, and esophagus demonstrate no significant findings. Lungs/Pleura: Mild mosaic attenuation pattern throughout the pulmonary parenchyma, likely secondary to expiratory phase image acquisition. An azygos fissure is noted. No focal  consolidations. Mild bibasilar subsegmental atelectasis. No suspicious pulmonary nodules. No pleural effusion or pneumothorax. No focal consolidations. No suspicious pulmonary nodules. No pleural effusion or pneumothorax. Upper Abdomen: Incompletely visualized bilateral nonobstructive appearing nephrolithiasis. Gallbladder is surgically absent. The remaining visualized upper abdomen is within normal limits. Musculoskeletal: No chest wall abnormality. Multilevel degenerative changes of the thoracic spine no acute osseous findings. Review of the MIP images confirms the above findings. IMPRESSION: No evidence of pulmonary embolism. Mild diffuse mosaic attenuation pattern of the pulmonary parenchyma likely due to expiratory phase imaging, however could also be seen with mild pulmonary edema. Nonobstructive appearing bilateral nephrolithiasis, incompletely evaluated. Electronically Signed   By: Ruthann Cancer MD   On: 01/16/2020 12:45    Procedures Procedures (including critical care time)  Medications Ordered in ED Medications  iohexol (OMNIPAQUE) 350 MG/ML injection 75 mL (75 mLs Intravenous Contrast Given 01/16/20 1156)    ED Course  I have reviewed the triage vital signs and the nursing notes.  Pertinent labs & imaging results that were available during my care of the patient were reviewed by me and considered in my medical decision making (see chart for details).    MDM Rules/Calculators/A&P HEAR Score: 4                        Patient past medical history detailed above presented today for chest pain shortness of breath for 1 week.  Is been intermittent and seems to have no aggravating or mitigating factors.  See HPI for full details.  Physical exam is unremarkable.  Lungs are clear to auscultation all fields and she has no abnormalities on cardiac exam.  Given patient has a history of pulmonary embolisms and states that her symptoms are consistent with some today we will obtain CT PE study to  rule out pulmonary embolism.  This was negative.  Troponin x2 within normal limits and without any change.  CBC  without leukocytosis or anemia.  BMP with stable creatinine at baseline no significant electrolyte abnormalities.  She will follow up with her primary care doctor for reassessment.  She is given strict return precautions.  Chest x-ray also reviewed by myself which is without any acute abnormality.  EKG without any evidence of acute ischemia.  Patient initially tachycardic this resolved without intervention.  No evidence of hypoxia.  Reviewed prior cardiac evals. Dec 2017 pt had echo and stress test conducted without any abnormalities at that time.   I discussed this case with my attending physician who cosigned this note including patient's presenting symptoms, physical exam, and planned diagnostics and interventions. Attending physician stated agreement with plan or made changes to plan which were implemented.   Attending physician assessed patient at bedside.  Discharge patient with cardiology follow-up and return precautions also with omeprazole for possible reflux and hydroxyzine for possible anxiety.  She does take benzos.  Recommended that she follow with PCP and perhaps consider transition to SSRI/SNRI.  Final Clinical Impression(s) / ED Diagnoses Final diagnoses:  Chest pain, unspecified type    Rx / DC Orders ED Discharge Orders         Ordered    omeprazole (PRILOSEC) 20 MG capsule  Daily        01/16/20 1409    hydrOXYzine (ATARAX/VISTARIL) 25 MG tablet  Every 6 hours        01/16/20 1409           Pati Gallo [place], [place] 01/16/20 1809    Charlesetta Shanks, MD 01/17/20 1449

## 2020-01-16 NOTE — ED Provider Notes (Signed)
Medical screening examination/treatment/procedure(s) were conducted as a shared visit with non-physician practitioner(s) and myself.  I personally evaluated the patient during the encounter.  EKG Interpretation  Date/Time:  Saturday January 16 2020 10:00:00 EDT Ventricular Rate:  95 PR Interval:    QRS Duration: 88 QT Interval:  380 QTC Calculation: 478 R Axis:   -64 Text Interpretation: Sinus rhythm Left anterior fascicular block poor r wave progression, similar to older tracings Confirmed by Charlesetta Shanks [phone]) on 01/16/2020 1:08:56 PM  Patient reports symptoms started about a week ago with URI type symptoms.  She had nasal congestion and drainage in her throat.  She had some facial pressure.  She saw her PCP.  She reports symptoms progressed and then she started developing chest discomfort.  She had a slight amount of coughing.  Chest discomfort was first somewhat to the right but then more central aching quality and radiating to her back at times.  Has been coming and going for about a week.  She thought maybe it was her asthma but has not gotten relief with her albuterol.  She does not note that the pain is any worse with exertion.  It will come and go independent of activities.  She has not had lower extremity swelling.  Patient however had concern for PE because she has had one in the past.  Patient denies history of being treated for reflux.  Alert and nontoxic.  No respiratory distress.  Heart is regular without rub murmur gallop.  Lungs are clear.  Epigastrium upper abdomen is nontender.  No significant peripheral edema.  Skin warm and dry.  Agnostic results reviewed.  PE study negative for PE.  EKG without ischemic changes and troponin negative.  At this time, patient does have cardiac risk factors but pain is atypical.  Will recommend outpatient follow-up with cardiology for reassessment.  Last diagnostic testing done in 2017.  No evidence of pneumonia.  Patient does describe  symptoms starting with URI type symptoms.  She however is not having any wheezing and has good airflow.  I do not think she has significant asthma exacerbation at this time.  With quality of central pain and some radiation to the back will have patient trial a course of omeprazole and provide information for reflux.  She is also to follow-up with PCP ASAP and return precautions reviewed.   Charlesetta Shanks, MD 01/16/20 1410

## 2020-01-21 ENCOUNTER — Ambulatory Visit: Payer: 59

## 2020-01-25 ENCOUNTER — Ambulatory Visit: Payer: 59 | Admitting: Family Medicine

## 2020-07-17 ENCOUNTER — Other Ambulatory Visit: Payer: Self-pay | Admitting: Family Medicine

## 2020-07-17 DIAGNOSIS — F411 Generalized anxiety disorder: Secondary | ICD-10-CM

## 2020-08-14 NOTE — Progress Notes (Signed)
    SUBJECTIVE:   CHIEF COMPLAINT / HPI: check up and medication refills   HTN  Patient adherent to medications. Does not measure BP at home. Denies symptoms of hypotension. Denies frequent HA or chest pain.   Type 2 diabetes Glyburide/metformin adherence  Patient measures blood glucose levels at home and range between 70-120s both fasting and postprandial.  Patient reports she has been working on her diet to help control her BG   UTI Concern  Patient reports that for the last four days she has been having discomfort associated with urination. She denies hematuria but reports hx of infections in the past and is concerned that she may have another. She denies fevers or chills. She denies presence of back pain.    PERTINENT  PMH / PSH:  Hypertension Type 2 diabetes Hypothyroidism CKD, 3B Asthma  OBJECTIVE:   BP 138/82   Pulse 88   Ht 5\' 6"  (1.676 m)   Wt 213 lb 6.4 oz (96.8 kg)   SpO2 97%   BMI 34.44 kg/m   General: Female appearing stated age in no acute distress HEENT: MMM, no oral lesions noted,Neck non-tender without lymphadenopathy Cardio: Normal S1 and S2, no S3 or S4. Rhythm is regular. No murmurs or rubs.  Bilateral radial pulses palpable Pulm: Clear to auscultation bilaterally, no crackles, wheezing, or diminished breath sounds. Normal respiratory effort Abdomen: Bowel sounds normal. Abdomen soft and non-tender.  Extremities: No peripheral edema. Warm & well perfused.  Neuro: pt alert and oriented x4, follows commands, PERRLA, EOMI bilaterally   ASSESSMENT/PLAN:   UTI (urinary tract infection) U/A ordered  + for leukocytes Given hx will prescribe Keflex for UTI   Type II diabetes mellitus with complication Repeat Hgb V7C today  Well within goal range Offered to discontinue metformin or glyburide however patient declines as when she has tried to stop them before, her A1c worsened  Congratulated patient on hard work for weight loss and glycemic  control  Health care maintenance Mammogram ordered  Ophthalmology referral to submit once patient has updated insurance for DM eye exam  A1c completed today   HYPERTENSION, BENIGN SYSTEMIC Adherence to medication regimen Continue current therapies  CMP today      Eulis Foster, MD [place]

## 2020-08-14 NOTE — Patient Instructions (Signed)
It was a pleasure to see you today!  Thank you for choosing [facility] for your primary care.   Allison [name] was seen for diabetes checkup and medication management.   Our plans for today were:  For your diabetes, you are doing a great job with lowering your hemoglobin A1c to 5.9 from 6.3.  This is technically in the prediabetes range so keep up the good work!  I have sent refills for your other medications.  I have also submitted a referral to ophthalmology for you to arrange for a diabetes eye exam.  Please be on the look out for a call from their office in order to schedule an appointment.  I have submitted an order for you to have a mammogram at the [facility] breast center.  Please call [phone] to schedule this appointment at your convenience.  We will complete a study of your urine to see if he have a UTI.  If the results are abnormal, I will call you to discuss next steps.   Best Wishes,   Dr. Alba Cory

## 2020-08-15 ENCOUNTER — Ambulatory Visit (INDEPENDENT_AMBULATORY_CARE_PROVIDER_SITE_OTHER): Payer: 59 | Admitting: Family Medicine

## 2020-08-15 ENCOUNTER — Ambulatory Visit (INDEPENDENT_AMBULATORY_CARE_PROVIDER_SITE_OTHER): Payer: 59

## 2020-08-15 ENCOUNTER — Other Ambulatory Visit: Payer: Self-pay

## 2020-08-15 ENCOUNTER — Encounter: Payer: Self-pay | Admitting: Family Medicine

## 2020-08-15 ENCOUNTER — Other Ambulatory Visit: Payer: Self-pay | Admitting: Family Medicine

## 2020-08-15 VITALS — BP 138/82 | HR 88 | Ht 66.0 in | Wt 213.4 lb

## 2020-08-15 DIAGNOSIS — I10 Essential (primary) hypertension: Secondary | ICD-10-CM | POA: Diagnosis not present

## 2020-08-15 DIAGNOSIS — E890 Postprocedural hypothyroidism: Secondary | ICD-10-CM

## 2020-08-15 DIAGNOSIS — Z23 Encounter for immunization: Secondary | ICD-10-CM

## 2020-08-15 DIAGNOSIS — E119 Type 2 diabetes mellitus without complications: Secondary | ICD-10-CM

## 2020-08-15 DIAGNOSIS — Z0000 Encounter for general adult medical examination without abnormal findings: Secondary | ICD-10-CM | POA: Diagnosis not present

## 2020-08-15 DIAGNOSIS — E118 Type 2 diabetes mellitus with unspecified complications: Secondary | ICD-10-CM | POA: Diagnosis not present

## 2020-08-15 DIAGNOSIS — R300 Dysuria: Secondary | ICD-10-CM

## 2020-08-15 DIAGNOSIS — Z1231 Encounter for screening mammogram for malignant neoplasm of breast: Secondary | ICD-10-CM

## 2020-08-15 DIAGNOSIS — N3000 Acute cystitis without hematuria: Secondary | ICD-10-CM

## 2020-08-15 LAB — POCT URINALYSIS DIP (MANUAL ENTRY)
Bilirubin, UA: NEGATIVE
Glucose, UA: NEGATIVE mg/dL
Ketones, POC UA: NEGATIVE mg/dL
Nitrite, UA: NEGATIVE
Protein Ur, POC: NEGATIVE mg/dL
Spec Grav, UA: 1.015 (ref 1.010–1.025)
Urobilinogen, UA: 0.2 E.U./dL
pH, UA: 5.5 (ref 5.0–8.0)

## 2020-08-15 LAB — POCT UA - MICROSCOPIC ONLY

## 2020-08-15 LAB — POCT GLYCOSYLATED HEMOGLOBIN (HGB A1C): HbA1c, POC (controlled diabetic range): 5.9 % (ref 0.0–7.0)

## 2020-08-15 MED ORDER — LEVOTHYROXINE SODIUM 100 MCG PO TABS: 100.0000 ug | ORAL_TABLET | Freq: Every day | ORAL | 1 refills | Status: DC

## 2020-08-15 MED ORDER — GLYBURIDE 5 MG PO TABS
10.0000 mg | ORAL_TABLET | Freq: Every day | ORAL | 1 refills | Status: DC
Start: 2020-08-15 — End: 2020-09-14

## 2020-08-15 MED ORDER — METFORMIN HCL 1000 MG PO TABS: 1000.0000 mg | ORAL_TABLET | Freq: Two times a day (BID) | ORAL | 1 refills | Status: DC

## 2020-08-15 MED ORDER — CETIRIZINE HCL 10 MG PO TABS
10.0000 mg | ORAL_TABLET | Freq: Every day | ORAL | 1 refills | Status: DC
Start: 2020-08-15 — End: 2022-02-02

## 2020-08-15 MED ORDER — CEPHALEXIN 500 MG PO CAPS: 500.0000 mg | ORAL_CAPSULE | Freq: Three times a day (TID) | ORAL | 0 refills | Status: DC

## 2020-08-16 LAB — TSH: TSH: 0.212 u[IU]/mL — ABNORMAL LOW (ref 0.450–4.500)

## 2020-08-16 LAB — COMPREHENSIVE METABOLIC PANEL
ALT: 10 IU/L (ref 0–32)
AST: 17 IU/L (ref 0–40)
Albumin/Globulin Ratio: 2 (ref 1.2–2.2)
Albumin: 4.5 g/dL (ref 3.8–4.8)
Alkaline Phosphatase: 58 IU/L (ref 44–121)
BUN/Creatinine Ratio: 16 (ref 12–28)
BUN: 17 mg/dL (ref 8–27)
Bilirubin Total: 0.6 mg/dL (ref 0.0–1.2)
CO2: 21 mmol/L (ref 20–29)
Calcium: 7.8 mg/dL — ABNORMAL LOW (ref 8.7–10.3)
Chloride: 99 mmol/L (ref 96–106)
Creatinine, Ser: 1.04 mg/dL — ABNORMAL HIGH (ref 0.57–1.00)
Globulin, Total: 2.3 g/dL (ref 1.5–4.5)
Glucose: 104 mg/dL — ABNORMAL HIGH (ref 65–99)
Potassium: 4.1 mmol/L (ref 3.5–5.2)
Sodium: 140 mmol/L (ref 134–144)
Total Protein: 6.8 g/dL (ref 6.0–8.5)
eGFR: 60 mL/min/{1.73_m2} (ref >59)

## 2020-08-17 LAB — URINE CULTURE

## 2020-08-17 NOTE — Assessment & Plan Note (Signed)
Adherence to medication regimen Continue current therapies  CMP today

## 2020-08-17 NOTE — Assessment & Plan Note (Signed)
U/A ordered  + for leukocytes Given hx will prescribe Keflex for UTI

## 2020-08-17 NOTE — Assessment & Plan Note (Signed)
Mammogram ordered  Ophthalmology referral to submit once patient has updated insurance for DM eye exam  A1c completed today

## 2020-08-17 NOTE — Assessment & Plan Note (Signed)
Repeat Hgb A1c today  Well within goal range Offered to discontinue metformin or glyburide however patient declines as when she has tried to stop them before, her A1c worsened  Congratulated patient on hard work for weight loss and glycemic control

## 2020-08-18 ENCOUNTER — Other Ambulatory Visit: Payer: Self-pay | Admitting: Family Medicine

## 2020-08-18 DIAGNOSIS — E890 Postprocedural hypothyroidism: Secondary | ICD-10-CM

## 2020-08-18 MED ORDER — LEVOTHYROXINE SODIUM 88 MCG PO TABS: 88.0000 ug | ORAL_TABLET | ORAL | 1 refills | Status: DC

## 2020-09-06 ENCOUNTER — Emergency Department (HOSPITAL_COMMUNITY): Payer: 59

## 2020-09-06 ENCOUNTER — Encounter (HOSPITAL_COMMUNITY): Payer: Self-pay | Admitting: Emergency Medicine

## 2020-09-06 ENCOUNTER — Emergency Department (HOSPITAL_COMMUNITY)
Admission: EM | Admit: 2020-09-06 | Discharge: 2020-09-06 | Disposition: A | Discharge status: Home / Self Care | Payer: 59 | Attending: Emergency Medicine | Admitting: Emergency Medicine

## 2020-09-06 ENCOUNTER — Other Ambulatory Visit: Payer: Self-pay

## 2020-09-06 DIAGNOSIS — E1122 Type 2 diabetes mellitus with diabetic chronic kidney disease: Secondary | ICD-10-CM | POA: Insufficient documentation

## 2020-09-06 DIAGNOSIS — Z8542 Personal history of malignant neoplasm of other parts of uterus: Secondary | ICD-10-CM | POA: Insufficient documentation

## 2020-09-06 DIAGNOSIS — R109 Unspecified abdominal pain: Secondary | ICD-10-CM | POA: Insufficient documentation

## 2020-09-06 DIAGNOSIS — Z7984 Long term (current) use of oral hypoglycemic drugs: Secondary | ICD-10-CM | POA: Diagnosis not present

## 2020-09-06 DIAGNOSIS — I129 Hypertensive chronic kidney disease with stage 1 through stage 4 chronic kidney disease, or unspecified chronic kidney disease: Secondary | ICD-10-CM | POA: Insufficient documentation

## 2020-09-06 DIAGNOSIS — Z79899 Other long term (current) drug therapy: Secondary | ICD-10-CM | POA: Insufficient documentation

## 2020-09-06 DIAGNOSIS — E039 Hypothyroidism, unspecified: Secondary | ICD-10-CM | POA: Diagnosis not present

## 2020-09-06 DIAGNOSIS — R000 Tachycardia, unspecified: Secondary | ICD-10-CM | POA: Diagnosis not present

## 2020-09-06 DIAGNOSIS — M549 Dorsalgia, unspecified: Secondary | ICD-10-CM | POA: Insufficient documentation

## 2020-09-06 DIAGNOSIS — N183 Chronic kidney disease, stage 3 unspecified: Secondary | ICD-10-CM | POA: Diagnosis not present

## 2020-09-06 DIAGNOSIS — K219 Gastro-esophageal reflux disease without esophagitis: Secondary | ICD-10-CM | POA: Insufficient documentation

## 2020-09-06 DIAGNOSIS — J45909 Unspecified asthma, uncomplicated: Secondary | ICD-10-CM | POA: Diagnosis not present

## 2020-09-06 LAB — COMPREHENSIVE METABOLIC PANEL
ALT: 12 U/L (ref 0–44)
AST: 15 U/L (ref 15–41)
Albumin: 3.8 g/dL (ref 3.5–5.0)
Alkaline Phosphatase: 56 U/L (ref 38–126)
Anion gap: 10 (ref 5–15)
BUN: 15 mg/dL (ref 8–23)
CO2: 30 mmol/L (ref 22–32)
Calcium: 9 mg/dL (ref 8.9–10.3)
Chloride: 97 mmol/L — ABNORMAL LOW (ref 98–111)
Creatinine, Ser: 1.1 mg/dL — ABNORMAL HIGH (ref 0.44–1.00)
GFR, Estimated: 56 mL/min — ABNORMAL LOW (ref >60)
Glucose, Bld: 158 mg/dL — ABNORMAL HIGH (ref 70–99)
Potassium: 3.7 mmol/L (ref 3.5–5.1)
Sodium: 137 mmol/L (ref 135–145)
Total Bilirubin: 1.1 mg/dL (ref 0.3–1.2)
Total Protein: 7.7 g/dL (ref 6.5–8.1)

## 2020-09-06 LAB — URINALYSIS, ROUTINE W REFLEX MICROSCOPIC
Bilirubin Urine: NEGATIVE
Glucose, UA: NEGATIVE mg/dL
Hgb urine dipstick: NEGATIVE
Ketones, ur: 20 mg/dL — AB
Nitrite: NEGATIVE
Protein, ur: NEGATIVE mg/dL
Specific Gravity, Urine: 1.016 (ref 1.005–1.030)
pH: 5 (ref 5.0–8.0)

## 2020-09-06 LAB — CBC
HCT: 43.1 % (ref 36.0–46.0)
Hemoglobin: 14.2 g/dL (ref 12.0–15.0)
MCH: 30.5 pg (ref 26.0–34.0)
MCHC: 32.9 g/dL (ref 30.0–36.0)
MCV: 92.7 fL (ref 80.0–100.0)
Platelets: 215 10*3/uL (ref 150–400)
RBC: 4.65 MIL/uL (ref 3.87–5.11)
RDW: 12.4 % (ref 11.5–15.5)
WBC: 7.4 10*3/uL (ref 4.0–10.5)
nRBC: 0 % (ref 0.0–0.2)

## 2020-09-06 MED ORDER — IBUPROFEN 400 MG PO TABS
600.0000 mg | ORAL_TABLET | ORAL | Status: AC
  Administered 2020-09-06: 600 mg via ORAL
  Filled 2020-09-06: qty 1

## 2020-09-06 NOTE — ED Provider Notes (Signed)
[place] EMERGENCY DEPARTMENT Provider Note   CSN: [id number] Arrival date & time: 09/06/20  0809     History Chief Complaint  Patient presents with  . Back Pain    Allison [name] is a 65 y.o. female.  HPI 65 year old female history of type 2 diabetes, hypertension, hyperlipidemia, hypothyroidism, status post complete hysterectomy and cholecystectomy, presents today complaining of right-sided abdominal pain.  States that began last night and was gradual onset.  Has worsened and been constant.  She can cite no improving factors.  She states that it is worse sometimes with a deep breath and coughing.  She has had no associated nausea, vomiting, diarrhea, cough, fever, urinary symptoms, or constipation.  She does not have a similar symptoms in the past.  She does have a history of kidney stones.  She does not identify this as being similar to that.    Past Medical History:  Diagnosis Date  . Anxiety   . Arthritis    "knees" (06/04/2016)  . Asthma    seasonal  . Chronic bronchitis ([place])   . History of gout   . History of kidney stones   . Hyperlipidemia   . Hypertension   . Hypothyroidism   . Pneumonia ~ 2013; 09/2015; 03/2016  . Pulmonary embolism ([place]) ~ 2013   "2 big ones",   . Thyroid goiter   . Type II diabetes mellitus ([place])   . Uterine cancer ([place]) 2000   Hysterectomy  . Ventral hernia     Patient Active Problem List   Diagnosis Date Noted  . Fatigue 01/13/2020  . Encounter for screening mammogram for malignant neoplasm of breast 07/16/2019  . Health care maintenance 04/13/2018  . Hypocalcemia 04/19/2017  . S/P laparoscopic cholecystectomy 06/04/2016  . Chronic kidney disease (CKD), stage III (moderate) ([place]) 09/12/2015  . Uric acid renal calculus 11/24/2012  . Nephrolithiasis 10/18/2011  . Hypothyroid 10/10/2010  . Venous stasis dermatitis 09/26/2010  . UTI (urinary tract infection) 07/28/2010  . Asthma 01/03/2010  . PROTEINURIA  08/25/2009  . MORBID OBESITY 04/14/2009  . KNEE PAIN, BILATERAL 04/14/2009  . GOITER NOS 05/30/2006  . Type II diabetes mellitus with complication ([place]) 32/67/1245  . HYPERLIPIDEMIA 05/30/2006  . Anxiety state 05/30/2006  . DEPRESSIVE DISORDER, NOS 05/30/2006  . HYPERTENSION, BENIGN SYSTEMIC 05/30/2006  . GERD 04/05/2006  . LOW BACK PAIN 04/05/2006    Past Surgical History:  Procedure Laterality Date  . CHOLECYSTECTOMY N/A 06/04/2016   Procedure: LAPAROSCOPIC CHOLECYSTECTOMY;  Surgeon: Rolm Bookbinder, MD;  Location: [facility];  Service: General;  Laterality: N/A;  LAPAROSCOPIC CHOLECYSTECTOMY  . CYSTOSCOPY W/ RETROGRADES Bilateral 12/18/2016   Procedure: CYSTOSCOPY WITH RETROGRADE PYELOGRAM;  Surgeon: Nickie Retort, MD;  Location: [facility];  Service: Urology;  Laterality: Bilateral;  . CYSTOSCOPY W/ URETERAL STENT REMOVAL Right 12/18/2016   Procedure: CYSTOSCOPY WITH STENT REMOVAL;  Surgeon: Nickie Retort, MD;  Location: [facility];  Service: Urology;  Laterality: Right;  . CYSTOSCOPY WITH RETROGRADE PYELOGRAM, URETEROSCOPY AND STENT PLACEMENT Right 04/17/2016   Procedure: CYSTOSCOPY WITH RETROGRADE PYELOGRAM, URETEROSCOPY AND STENT PLACEMENT;  Surgeon: Nickie Retort, MD;  Location: [facility];  Service: Urology;  Laterality: Right;  . CYSTOSCOPY/URETEROSCOPY/HOLMIUM LASER/STENT PLACEMENT Bilateral 12/18/2016   Procedure: CYSTOSCOPY/URETEROSCOPY/HOLMIUM LASER/    BILATERAL STENT PLACEMENT;  Surgeon: Nickie Retort, MD;  Location: [facility];  Service: Urology;  Laterality: Bilateral;  . DILATION AND CURETTAGE OF UTERUS     S/P miscarriage  .  HOLMIUM LASER APPLICATION Bilateral 6/83/4196   Procedure: HOLMIUM LASER APPLICATION;  Surgeon: Nickie Retort, MD;  Location: [facility];  Service: Urology;  Laterality: Bilateral;  . IR URETERAL STENT RIGHT NEW ACCESS W/O SEP NEPHROSTOMY CATH  11/20/2016  .  LAPAROSCOPIC CHOLECYSTECTOMY  06/04/2016  . NEPHROLITHOTOMY Right 11/20/2016   Procedure: NEPHROLITHOTOMY PERCUTANEOUS;  Surgeon: Nickie Retort, MD;  Location: [facility];  Service: Urology;  Laterality: Right;  . TONSILLECTOMY    . TOTAL ABDOMINAL HYSTERECTOMY  2000s   "endometrial CA"  . TOTAL THYROIDECTOMY  2012   "had goiter on it"-one lobe removed     OB History   No obstetric history on file.     Family History  Adopted: Yes  Problem Relation Age of Onset  . CAD Brother 65    Social History   Tobacco Use  . Smoking status: Never Smoker  . Smokeless tobacco: Never Used  Substance Use Topics  . Alcohol use: No  . Drug use: No    Home Medications Prior to Admission medications   Medication Sig Start Date End Date Taking? Authorizing Provider  albuterol (PROAIR HFA) 108 (90 Base) MCG/ACT inhaler USE 2 PUFFS BY MOUTH EVERY  6 HOURS AS NEEDED FOR  WHEEZING OR SHORTNESS OF  BREATH 01/13/20   [name], MD  ALPRAZolam Duanne Moron) 0.5 MG tablet TAKE 1 TABLET BY MOUTH ONCE DAILY AS NEEDED FOR ANXIETY 07/19/20   Carollee Leitz, MD  calcium carbonate (OSCAL) 1500 (600 Ca) MG TABS tablet Take 1 tablet (1,500 mg total) by mouth 2 (two) times daily with a meal. Patient not taking: Reported on 08/15/2020 07/16/19   [name], MD  cephALEXin (KEFLEX) 500 MG capsule Take 1 capsule (500 mg total) by mouth 3 (three) times daily. 08/15/20   Simmons-Robinson, Riki Sheer, MD  cetirizine (ZYRTEC) 10 MG tablet Take 1 tablet (10 mg total) by mouth daily. 08/15/20   Simmons-Robinson, Riki Sheer, MD  glyBURIDE (DIABETA) 5 MG tablet Take 2 tablets (10 mg total) by mouth daily with breakfast. 08/15/20   Simmons-Robinson, Riki Sheer, MD  levothyroxine (SYNTHROID) 88 MCG tablet Take 1 tablet (88 mcg total) by mouth every morning. 30 minutes before food 08/18/20   Simmons-Robinson, Riki Sheer, MD  metFORMIN (GLUCOPHAGE) 1000 MG tablet Take 1 tablet (1,000 mg total) by mouth 2 (two) times daily. 08/15/20    Simmons-Robinson, Riki Sheer, MD  pravastatin (PRAVACHOL) 40 MG tablet Take 1 tablet (40 mg total) by mouth every evening. 01/13/20 01/12/21  [name], MD  albuterol (PROVENTIL,VENTOLIN) 90 MCG/ACT inhaler Inhale 2 puffs into the lungs every 6 (six) hours as needed. 10/23/10 11/13/11  [place], Sarah T, MD    Allergies    Oxycodone, Simvastatin, and Tramadol hcl  Review of Systems   Review of Systems  All other systems reviewed and are negative.   Physical Exam Updated Vital Signs BP (!) 130/94 (BP Location: Right Arm)   Pulse (!) 109   Temp 98.7 F (37.1 C) (Oral)   Resp 18   SpO2 99%   Physical Exam Vitals and nursing note reviewed.  Constitutional:      Appearance: Normal appearance.  HENT:     Head: Normocephalic.     Right Ear: External ear normal.     Left Ear: External ear normal.     Nose: Nose normal.     Mouth/Throat:     Pharynx: Oropharynx is clear.  Eyes:     Extraocular Movements: Extraocular movements intact.     Pupils:  Pupils are equal, round, and reactive to light.  Cardiovascular:     Rate and Rhythm: Regular rhythm. Tachycardia present.     Pulses: Normal pulses.     Heart sounds: Normal heart sounds.  Pulmonary:     Effort: Pulmonary effort is normal.     Breath sounds: Normal breath sounds.  Abdominal:     General: Abdomen is flat. Bowel sounds are normal.     Palpations: Abdomen is soft.     Tenderness: There is abdominal tenderness.     Comments: Mild diffuse tenderness palpation on the right side of her abdomen.  She has a ventral and umbilical hernia.  There is no evidence of incarceration this area is nontender.  Musculoskeletal:        General: Normal range of motion.     Cervical back: Normal range of motion.  Skin:    General: Skin is warm.     Capillary Refill: Capillary refill takes less than 2 seconds.  Neurological:     General: No focal deficit present.     Mental Status: She is alert.  Psychiatric:        Mood and Affect:  Mood normal.        Behavior: Behavior normal.     ED Results / Procedures / Treatments   Labs (all labs ordered are listed, but only abnormal results are displayed) Labs Reviewed  CBC  COMPREHENSIVE METABOLIC PANEL  URINALYSIS, ROUTINE W REFLEX MICROSCOPIC    EKG None  Radiology CT ABDOMEN PELVIS WO CONTRAST  Result Date: 09/06/2020 CLINICAL DATA:  Onset right side low back pain last night. No known injury. EXAM: CT ABDOMEN AND PELVIS WITHOUT CONTRAST TECHNIQUE: Multidetector CT imaging of the abdomen and pelvis was performed following the standard protocol without IV contrast. COMPARISON:  CT abdomen and pelvis 11/21/2016. FINDINGS: Lower chest: Mild dependent atelectasis left lung base. Imaged right lung is clear. No pleural or pericardial effusion. Hepatobiliary: No focal liver abnormality is seen. No gallstones, gallbladder wall thickening, or biliary dilatation. Pancreas: Unremarkable. No pancreatic ductal dilatation or surrounding inflammatory changes. Spleen: Normal in size without focal abnormality. Adrenals/Urinary Tract: The adrenal glands appear normal. The patient has staghorn calculi in the lower pole of both the right and left kidneys. Discrete measurement is not possible but the right lower pole stone measures approximately 0.7 cm transverse by 2.6 cm craniocaudal by 0.9 cm AP. Largest stone in the lower pole of the left kidney is 0.8 cm craniocaudal by 1 cm AP by 0.5 cm transverse. Punctate nonobstructing stones are also seen in the mid and upper poles of the left kidney. Negative for hydronephrosis. No ureteral or urinary bladder stone. Stomach/Bowel: Stomach is within normal limits. Appendix appears normal. No evidence of bowel wall thickening, distention, or inflammatory changes. Vascular/Lymphatic: Aortic atherosclerosis. No enlarged abdominal or pelvic lymph nodes. Reproductive: Status post hysterectomy. No adnexal masses. Other: Small fat containing ventral hernia is  unchanged. Musculoskeletal: No acute abnormality. Severe right hip osteoarthritis has progressed since the prior examination. Advanced degenerative disc disease L5-S1 is also worse than on the prior study. IMPRESSION: No acute abnormality. Negative for hydronephrosis or ureteral stone. Bilateral nonobstructing renal stones including staghorn calculi in the lower poles of both kidneys as described above. Severe right hip osteoarthritis and advanced degenerative disc disease L5-S1 have progressed since the prior exam. Small fat containing midline ventral hernia. Aortic Atherosclerosis (ICD10-I70.0). Electronically Signed   By: Inge Rise M.D.   On: 09/06/2020 12:41  Procedures Procedures   Medications Ordered in ED Medications - No data to display  ED Course  I have reviewed the triage vital signs and the nursing notes.  Pertinent labs & imaging results that were available during my care of the patient were reviewed by me and considered in my medical decision making (see chart for details).    MDM Rules/Calculators/A&P                          Patient with right-sided abdominal pain.  She does not really have any associated symptoms that would be alarming for appendicitis-no nausea, no vomiting, and is not worsened with any food intake.  She has had to have her gallbladder out and has also had a complete hysterectomy.  Pain does appear to originate from the abdomen with diffuse tenderness in the abdomen reproducing the pain that she is having.  Patient is mildly tachycardic, she has been afebrile and appears generally well.  Plan labs, urinalysis, and CT scan of abdomen.  12:46 PM  No intraabdominal source of pain found on ct.  However, bilateral staghorn calculi found.  Urinalysis reviewed and consistent with contaminated specimen but does not appear to have acute infection Plan referral to urology. All discussed with patient and advised re f/u and return precautions.  Final Clinical  Impression(s) / ED Diagnoses Final diagnoses:  Right flank pain    Rx / DC Orders ED Discharge Orders    None       Pattricia Boss, MD 09/06/20 1327

## 2020-09-06 NOTE — Discharge Instructions (Addendum)
Please call urology for outpatient follow-up Continue ibuprofen as needed for pain Return if you are having worsening symptoms such as worsening pain, nausea, vomiting, fever, or chills

## 2020-09-06 NOTE — ED Triage Notes (Signed)
Pt reports R lower back pain without injury, onset last night. Denies urinary problems. Took tylenol without relief.

## 2020-09-12 ENCOUNTER — Other Ambulatory Visit: Payer: Self-pay

## 2020-09-12 DIAGNOSIS — F411 Generalized anxiety disorder: Secondary | ICD-10-CM

## 2020-09-15 ENCOUNTER — Telehealth: Payer: Self-pay

## 2020-09-15 MED ORDER — GLYBURIDE 5 MG PO TABS: 10.0000 mg | ORAL_TABLET | Freq: Every day | ORAL | 1 refills | Status: DC

## 2020-09-15 MED ORDER — ALPRAZOLAM 0.5 MG PO TABS
0.5000 mg | ORAL_TABLET | Freq: Every day | ORAL | 0 refills | Status: DC | PRN
Start: 2020-09-15 — End: 2020-11-24

## 2020-09-15 NOTE — Telephone Encounter (Signed)
Clinical info completed on DMV form.  Placed form in Dr Serita Grit box for completion.  Ottis Stain, CMA

## 2020-09-15 NOTE — Telephone Encounter (Signed)
DMV Placard form dropped off for at front desk for completion.  Verified that patient section of form has been completed.  Last DOS/WCC with PCP was 08/15/2020.  Placed form in team folder to be completed by clinical staff.  [employer]

## 2020-09-16 NOTE — Telephone Encounter (Signed)
I have completed the form and placed it in the nurse box. Please let the pt know. Thank you!

## 2020-09-19 NOTE — Telephone Encounter (Signed)
Patient contact and informed of handicap placard ready for pick up. A copy was made for batch scanning.

## 2020-09-20 ENCOUNTER — Other Ambulatory Visit: Payer: Self-pay | Admitting: Urology

## 2020-09-20 ENCOUNTER — Other Ambulatory Visit (HOSPITAL_COMMUNITY): Payer: Self-pay | Admitting: Urology

## 2020-09-20 DIAGNOSIS — N200 Calculus of kidney: Secondary | ICD-10-CM

## 2020-10-17 NOTE — Progress Notes (Signed)
DUE TO COVID-19 ONLY ONE VISITOR IS ALLOWED TO COME WITH YOU AND STAY IN THE WAITING ROOM ONLY DURING PRE OP AND PROCEDURE DAY OF SURGERY. THE 1 VISITOR  MAY VISIT WITH YOU AFTER SURGERY IN YOUR PRIVATE ROOM DURING VISITING HOURS ONLY!  YOU NEED TO HAVE A COVID 19 TEST ON__7/28/2022 _____ @_______ , THIS TEST MUST BE DONE BEFORE SURGERY,  COVID TESTING SITE [address] [place] [address], IT IS ON THE RIGHT GOING OUT [address] APPROXIMATELY  2 MINUTES PAST [employer] ON THE RIGHT. ONCE YOUR COVID TEST IS COMPLETED,  PLEASE BEGIN THE QUARANTINE INSTRUCTIONS AS OUTLINED IN YOUR HANDOUT.                Allison [name]  10/17/2020   Your procedure is scheduled on:                    10/31/2020   Report to [facility] Main  Entrance   Report to admitting at       0800AM     Call this number if you have problems the morning of surgery [phone]    Remember: Do not eat food , candy gum or mints :After Midnight. You may have clear liquids from midnight until   0630am    CLEAR LIQUID DIET   Foods Allowed                                                                       Coffee and tea, regular and decaf                              Plain Jell-O any favor except red or purple                                            Fruit ices (not with fruit pulp)                                      Iced Popsicles                                     Carbonated beverages, regular and diet                                    Cranberry, grape and apple juices Sports drinks like Gatorade Lightly seasoned clear broth or consume(fat free) Sugar, honey syrup   _____________________________________________________________________    BRUSH YOUR TEETH MORNING OF SURGERY AND RINSE YOUR MOUTH OUT, NO CHEWING GUM CANDY OR MINTS.     Take these medicines the morning of surgery with A SIP OF WATER:  inhalers as usual and bring, zyrtec, synthroid   DO NOT TAKE ANY  DIABETIC MEDICATIONS DAY OF YOUR SURGERY  You may not have any metal on your body including hair pins and              piercings  Do not wear jewelry, make-up, lotions, powders or perfumes, deodorant             Do not wear nail polish on your fingernails.  Do not shave  48 hours prior to surgery.              Men may shave face and neck.   Do not bring valuables to the hospital. [place].  Contacts, dentures or bridgework may not be worn into surgery.  Leave suitcase in the car. After surgery it may be brought to your room.     Patients discharged the day of surgery will not be allowed to drive home. IF YOU ARE HAVING SURGERY AND GOING HOME THE SAME DAY, YOU MUST HAVE AN ADULT TO DRIVE YOU HOME AND BE WITH YOU FOR 24 HOURS. YOU MAY GO HOME BY TAXI OR UBER OR ORTHERWISE, BUT AN ADULT MUST ACCOMPANY YOU HOME AND STAY WITH YOU FOR 24 HOURS.  Name and phone number of your driver:  Special Instructions: N/A              Please read over the following fact sheets you were given: _____________________________________________________________________  [facility] - Preparing for Surgery Before surgery, you can play an important role.  Because skin is not sterile, your skin needs to be as free of germs as possible.  You can reduce the number of germs on your skin by washing with CHG (chlorahexidine gluconate) soap before surgery.  CHG is an antiseptic cleaner which kills germs and bonds with the skin to continue killing germs even after washing. Please DO NOT use if you have an allergy to CHG or antibacterial soaps.  If your skin becomes reddened/irritated stop using the CHG and inform your nurse when you arrive at Short Stay. Do not shave (including legs and underarms) for at least 48 hours prior to the first CHG shower.  You may shave your face/neck. Please follow these instructions carefully:  1.  Shower with CHG Soap  the night before surgery and the  morning of Surgery.  2.  If you choose to wash your hair, wash your hair first as usual with your  normal  shampoo.  3.  After you shampoo, rinse your hair and body thoroughly to remove the  shampoo.                           4.  Use CHG as you would any other liquid soap.  You can apply chg directly  to the skin and wash                       Gently with a scrungie or clean washcloth.  5.  Apply the CHG Soap to your body ONLY FROM THE NECK DOWN.   Do not use on face/ open                           Wound or open sores. Avoid contact with eyes, ears mouth and genitals (private parts).  Wash face,  Genitals (private parts) with your normal soap.             6.  Wash thoroughly, paying special attention to the area where your surgery  will be performed.  7.  Thoroughly rinse your body with warm water from the neck down.  8.  DO NOT shower/wash with your normal soap after using and rinsing off  the CHG Soap.                9.  Pat yourself dry with a clean towel.            10.  Wear clean pajamas.            11.  Place clean sheets on your bed the night of your first shower and do not  sleep with pets. Day of Surgery : Do not apply any lotions/deodorants the morning of surgery.  Please wear clean clothes to the hospital/surgery center.  FAILURE TO FOLLOW THESE INSTRUCTIONS MAY RESULT IN THE CANCELLATION OF YOUR SURGERY PATIENT SIGNATURE_________________________________  NURSE SIGNATURE__________________________________  ________________________________________________________________________

## 2020-10-19 ENCOUNTER — Other Ambulatory Visit: Payer: Self-pay

## 2020-10-19 ENCOUNTER — Encounter (HOSPITAL_COMMUNITY): Payer: Self-pay

## 2020-10-19 ENCOUNTER — Encounter (HOSPITAL_COMMUNITY)
Admission: RE | Admit: 2020-10-19 | Discharge: 2020-10-19 | Disposition: A | Discharge status: Home / Self Care | Payer: Medicare Other | Source: Ambulatory Visit | Attending: Urology | Admitting: Urology

## 2020-10-19 DIAGNOSIS — Z01812 Encounter for preprocedural laboratory examination: Secondary | ICD-10-CM | POA: Insufficient documentation

## 2020-10-19 HISTORY — DX: Personal history of diseases of the blood and blood-forming organs and certain disorders involving the immune mechanism: Z86.2

## 2020-10-19 LAB — CBC
HCT: 43.3 % (ref 36.0–46.0)
Hemoglobin: 14.1 g/dL (ref 12.0–15.0)
MCH: 30.5 pg (ref 26.0–34.0)
MCHC: 32.6 g/dL (ref 30.0–36.0)
MCV: 93.5 fL (ref 80.0–100.0)
Platelets: 193 10*3/uL (ref 150–400)
RBC: 4.63 MIL/uL (ref 3.87–5.11)
RDW: 13.3 % (ref 11.5–15.5)
WBC: 4.4 10*3/uL (ref 4.0–10.5)
nRBC: 0 % (ref 0.0–0.2)

## 2020-10-19 LAB — BASIC METABOLIC PANEL
Anion gap: 11 (ref 5–15)
BUN: 23 mg/dL (ref 8–23)
CO2: 29 mmol/L (ref 22–32)
Calcium: 8 mg/dL — ABNORMAL LOW (ref 8.9–10.3)
Chloride: 102 mmol/L (ref 98–111)
Creatinine, Ser: 1 mg/dL (ref 0.44–1.00)
GFR, Estimated: >60 mL/min (ref >60)
Glucose, Bld: 115 mg/dL — ABNORMAL HIGH (ref 70–99)
Potassium: 4 mmol/L (ref 3.5–5.1)
Sodium: 142 mmol/L (ref 135–145)

## 2020-10-19 LAB — HEMOGLOBIN A1C
Hgb A1c MFr Bld: 5.9 % — ABNORMAL HIGH (ref 4.8–5.6)
Mean Plasma Glucose: 122.63 mg/dL

## 2020-10-19 LAB — GLUCOSE, CAPILLARY: Glucose-Capillary: 110 mg/dL — ABNORMAL HIGH (ref 70–99)

## 2020-10-19 NOTE — Patient Instructions (Addendum)
DUE TO COVID-19 ONLY ONE VISITOR IS ALLOWED TO COME WITH YOU AND STAY IN THE WAITING ROOM ONLY DURING PRE OP AND PROCEDURE.   **NO VISITORS ARE ALLOWED IN THE SHORT STAY AREA OR RECOVERY ROOM!!**  IF YOU WILL BE ADMITTED INTO THE HOSPITAL YOU ARE ALLOWED ONLY TWO SUPPORT PEOPLE DURING VISITATION HOURS ONLY (10AM -8PM)   The support person(s) may change daily. The support person(s) must pass our screening, gel in and out, and wear a mask at all times, including in the patient's room. Patients must also wear a mask when staff or their support person are in the room.  No visitors under the age of 25. Any visitor under the age of 21 must be accompanied by an adult.    COVID SWAB TESTING MUST BE COMPLETED ON:  Friday, October 28, 2020 at 10:10AM   [address] [place], [place] [address]    You are not required to quarantine, however you are required to wear a well-fitted mask when you are out and around people not in your household.  Hand Hygiene often Do NOT share personal items Notify your provider if you are in close contact with someone who has COVID or you develop fever 100.4 or greater, new onset of sneezing, cough, sore throat, shortness of breath or body aches.       Your procedure is scheduled on: Aug. 1, 2022   Report to [facility] Main  Entrance    Report to admitting at 8:00 AM   Call this number if you have problems the morning of surgery [phone]   Do not eat food :After Midnight.   May have liquids until 8:00AM   day of surgery  CLEAR LIQUID DIET  Foods Allowed                                                                     Foods Excluded  Water, Black Coffee and tea, regular and decaf                             liquids that you cannot  Plain Jell-O in any flavor  (No red)                                           see through such as: Fruit ices (not with fruit pulp)                                     milk, soups, orange juice              Iced  Popsicles (No red)                                    All solid food  Apple juices Sports drinks like Gatorade (No red) Lightly seasoned clear broth or consume(fat free) Sugar, honey syrup  Sample Menu Breakfast                                Lunch                                     Supper Cranberry juice                    Beef broth                            Chicken broth Jell-O                                     Grape juice                           Apple juice Coffee or tea                        Jell-O                                      Popsicle                                                Coffee or tea                        Coffee or tea       Do NOT smoke after Midnight   Take these medicines the morning of surgery with A SIP OF WATER: Cetirizine, Levothyroxine, Alprazolam if needed  DO NOT TAKE ANY ORAL DIABETIC MEDICATIONS DAY OF YOUR SURGERY                              You may not have any metal on your body including hair pins, jewelry, and body piercing             Do not wear make-up, lotions, powders, perfumes/cologne, or deodorant  Do not wear nail polish including gel and S&S, artificial/acrylic nails, or any other type of covering on natural nails including finger and toenails. If you have artificial nails, gel coating, etc. that needs to be removed by a nail salon please have this removed prior to surgery or surgery may need to be canceled/ delayed if the surgeon/ anesthesia feels like they are unable to be safely monitored.   Do not shave  48 hours prior to surgery.    Do not bring valuables to the hospital. [place].   Contacts, dentures or bridgework may not be worn into surgery.   Bring small overnight bag day of surgery.      Special Instructions: Bring  a copy of your healthcare power of attorney and living will documents         the day of surgery if you haven't  scanned them in before.              Please read over the following fact sheets you were given: IF YOU HAVE QUESTIONS ABOUT YOUR PRE OP INSTRUCTIONS PLEASE CALL [phone]   [facility] - Preparing for Surgery Before surgery, you can play an important role.  Because skin is not sterile, your skin needs to be as free of germs as possible.  You can reduce the number of germs on your skin by washing with CHG (chlorahexidine gluconate) soap before surgery.  CHG is an antiseptic cleaner which kills germs and bonds with the skin to continue killing germs even after washing. Please DO NOT use if you have an allergy to CHG or antibacterial soaps.  If your skin becomes reddened/irritated stop using the CHG and inform your nurse when you arrive at Short Stay. Do not shave (including legs and underarms) for at least 48 hours prior to the first CHG shower.  You may shave your face/neck.  Please follow these instructions carefully:  1.  Shower with CHG Soap the night before surgery and the  morning of surgery.  2.  If you choose to wash your hair, wash your hair first as usual with your normal  shampoo.  3.  After you shampoo, rinse your hair and body thoroughly to remove the shampoo.                             4.  Use CHG as you would any other liquid soap.  You can apply chg directly to the skin and wash.  Gently with a scrungie or clean washcloth.  5.  Apply the CHG Soap to your body ONLY FROM THE NECK DOWN.   Do   not use on face/ open                           Wound or open sores. Avoid contact with eyes, ears mouth and   genitals (private parts).                       Wash face,  Genitals (private parts) with your normal soap.             6.  Wash thoroughly, paying special attention to the area where your    surgery  will be performed.  7.  Thoroughly rinse your body with warm water from the neck down.  8.  DO NOT shower/wash with your normal soap after using and rinsing off the CHG Soap.                 9.  Pat yourself dry with a clean towel.            10.  Wear clean pajamas.            11.  Place clean sheets on your bed the night of your first shower and do not  sleep with pets. Day of Surgery : Do not apply any lotions/deodorants the morning of surgery.  Please wear clean clothes to the hospital/surgery center.  FAILURE TO FOLLOW THESE INSTRUCTIONS MAY RESULT IN THE CANCELLATION OF YOUR SURGERY  PATIENT SIGNATURE_________________________________  NURSE SIGNATURE__________________________________  ________________________________________________________________________

## 2020-10-19 NOTE — Progress Notes (Signed)
COVID Vaccine Completed: Yes Date COVID Vaccine completed: x2 Has received booster:x2 COVID vaccine manufacturer: Pfizer     Date of COVID positive in last 90 days: No  PCP - [name], MD Cardiologist - Kirk Ruths, MD remote history  Chest x-ray - 01/15/2020 in epic EKG - 01/18/2020 in epic Stress Test - greater than 2 years in epic ECHO - greater than 2 years in epic Cardiac Cath -  N/A Pacemaker/ICD device last checked: N/A  Sleep Study - N/A CPAP - N/A  Fasting Blood Sugar - 70-120 Checks Blood Sugar ___1__ times a day  Blood Thinner Instructions: N/A Aspirin Instructions: N/A Last Dose:N/A   Activity level:  activities of daily living without stopping and without symptoms       Anesthesia review: History of Pulmonary embolism, HTN, DM  Patient denies shortness of breath, fever, cough and chest pain at PAT appointment   Patient verbalized understanding of instructions that were given to them at the PAT appointment. Patient was also instructed that they will need to review over the PAT instructions again at home before surgery.

## 2020-10-28 ENCOUNTER — Other Ambulatory Visit: Payer: Self-pay | Admitting: Radiology

## 2020-10-28 ENCOUNTER — Other Ambulatory Visit (HOSPITAL_COMMUNITY)
Admission: RE | Admit: 2020-10-28 | Discharge: 2020-10-28 | Disposition: A | Discharge status: Home / Self Care | Payer: Medicare Other | Source: Ambulatory Visit | Attending: Urology | Admitting: Urology

## 2020-10-28 DIAGNOSIS — Z01812 Encounter for preprocedural laboratory examination: Secondary | ICD-10-CM | POA: Insufficient documentation

## 2020-10-28 DIAGNOSIS — Z20822 Contact with and (suspected) exposure to covid-19: Secondary | ICD-10-CM | POA: Insufficient documentation

## 2020-10-28 LAB — SARS CORONAVIRUS 2 (TAT 6-24 HRS): SARS Coronavirus 2: NEGATIVE

## 2020-10-31 ENCOUNTER — Other Ambulatory Visit: Payer: Self-pay

## 2020-10-31 ENCOUNTER — Observation Stay (HOSPITAL_COMMUNITY)
Admission: RE | Admit: 2020-10-31 | Discharge: 2020-11-01 | Disposition: A | Discharge status: Home / Self Care | Payer: Medicare Other | Attending: Urology | Admitting: Urology

## 2020-10-31 ENCOUNTER — Ambulatory Visit (HOSPITAL_COMMUNITY)
Admission: RE | Admit: 2020-10-31 | Discharge: 2020-10-31 | Disposition: A | Discharge status: Home / Self Care | Payer: Medicare Other | Source: Ambulatory Visit | Attending: Urology | Admitting: Urology

## 2020-10-31 ENCOUNTER — Ambulatory Visit (HOSPITAL_COMMUNITY): Payer: Medicare Other

## 2020-10-31 ENCOUNTER — Ambulatory Visit (HOSPITAL_COMMUNITY): Payer: Medicare Other | Admitting: Physician Assistant

## 2020-10-31 ENCOUNTER — Encounter (HOSPITAL_COMMUNITY)
Admission: RE | Disposition: A | Discharge status: Home / Self Care | Payer: Self-pay | Source: Home / Self Care | Attending: Urology

## 2020-10-31 ENCOUNTER — Encounter (HOSPITAL_COMMUNITY): Payer: Self-pay | Admitting: Urology

## 2020-10-31 DIAGNOSIS — Z7984 Long term (current) use of oral hypoglycemic drugs: Secondary | ICD-10-CM | POA: Insufficient documentation

## 2020-10-31 DIAGNOSIS — J45909 Unspecified asthma, uncomplicated: Secondary | ICD-10-CM | POA: Insufficient documentation

## 2020-10-31 DIAGNOSIS — E1122 Type 2 diabetes mellitus with diabetic chronic kidney disease: Secondary | ICD-10-CM | POA: Diagnosis not present

## 2020-10-31 DIAGNOSIS — E039 Hypothyroidism, unspecified: Secondary | ICD-10-CM | POA: Diagnosis not present

## 2020-10-31 DIAGNOSIS — I10 Essential (primary) hypertension: Secondary | ICD-10-CM | POA: Insufficient documentation

## 2020-10-31 DIAGNOSIS — E119 Type 2 diabetes mellitus without complications: Secondary | ICD-10-CM | POA: Insufficient documentation

## 2020-10-31 DIAGNOSIS — Z79899 Other long term (current) drug therapy: Secondary | ICD-10-CM | POA: Insufficient documentation

## 2020-10-31 DIAGNOSIS — Z8542 Personal history of malignant neoplasm of other parts of uterus: Secondary | ICD-10-CM | POA: Diagnosis not present

## 2020-10-31 DIAGNOSIS — Z862 Personal history of diseases of the blood and blood-forming organs and certain disorders involving the immune mechanism: Secondary | ICD-10-CM | POA: Insufficient documentation

## 2020-10-31 DIAGNOSIS — N183 Chronic kidney disease, stage 3 unspecified: Secondary | ICD-10-CM | POA: Diagnosis not present

## 2020-10-31 DIAGNOSIS — N200 Calculus of kidney: Secondary | ICD-10-CM | POA: Diagnosis not present

## 2020-10-31 DIAGNOSIS — I129 Hypertensive chronic kidney disease with stage 1 through stage 4 chronic kidney disease, or unspecified chronic kidney disease: Secondary | ICD-10-CM | POA: Diagnosis not present

## 2020-10-31 HISTORY — PX: NEPHROLITHOTOMY: SHX5134

## 2020-10-31 HISTORY — PX: IR URETERAL STENT PLACEMENT EXISTING ACCESS RIGHT: IMG6074

## 2020-10-31 LAB — BASIC METABOLIC PANEL
Anion gap: 12 (ref 5–15)
Anion gap: 8 (ref 5–15)
BUN: 19 mg/dL (ref 8–23)
BUN: 18 mg/dL (ref 8–23)
CO2: 29 mmol/L (ref 22–32)
CO2: 30 mmol/L (ref 22–32)
Calcium: 7.9 mg/dL — ABNORMAL LOW (ref 8.9–10.3)
Calcium: 7.3 mg/dL — ABNORMAL LOW (ref 8.9–10.3)
Chloride: 100 mmol/L (ref 98–111)
Chloride: 101 mmol/L (ref 98–111)
Creatinine, Ser: 1.05 mg/dL — ABNORMAL HIGH (ref 0.44–1.00)
Creatinine, Ser: 0.94 mg/dL (ref 0.44–1.00)
GFR, Estimated: 59 mL/min — ABNORMAL LOW (ref >60)
GFR, Estimated: >60 mL/min (ref >60)
Glucose, Bld: 159 mg/dL — ABNORMAL HIGH (ref 70–99)
Glucose, Bld: 152 mg/dL — ABNORMAL HIGH (ref 70–99)
Potassium: 3.8 mmol/L (ref 3.5–5.1)
Potassium: 3.6 mmol/L (ref 3.5–5.1)
Sodium: 141 mmol/L (ref 135–145)
Sodium: 139 mmol/L (ref 135–145)

## 2020-10-31 LAB — CBC WITH DIFFERENTIAL/PLATELET
Abs Immature Granulocytes: 0.02 10*3/uL (ref 0.00–0.07)
Basophils Absolute: 0 10*3/uL (ref 0.0–0.1)
Basophils Relative: 0 %
Eosinophils Absolute: 0.1 10*3/uL (ref 0.0–0.5)
Eosinophils Relative: 2 %
HCT: 44.8 % (ref 36.0–46.0)
Hemoglobin: 14.7 g/dL (ref 12.0–15.0)
Immature Granulocytes: 0 %
Lymphocytes Relative: 27 %
Lymphs Abs: 1.5 10*3/uL (ref 0.7–4.0)
MCH: 30.5 pg (ref 26.0–34.0)
MCHC: 32.8 g/dL (ref 30.0–36.0)
MCV: 92.9 fL (ref 80.0–100.0)
Monocytes Absolute: 0.4 10*3/uL (ref 0.1–1.0)
Monocytes Relative: 7 %
Neutro Abs: 3.7 10*3/uL (ref 1.7–7.7)
Neutrophils Relative %: 64 %
Platelets: 181 10*3/uL (ref 150–400)
RBC: 4.82 MIL/uL (ref 3.87–5.11)
RDW: 13.2 % (ref 11.5–15.5)
WBC: 5.7 10*3/uL (ref 4.0–10.5)
nRBC: 0 % (ref 0.0–0.2)

## 2020-10-31 LAB — PROTIME-INR
INR: 0.9 (ref 0.8–1.2)
Prothrombin Time: 12.5 seconds (ref 11.4–15.2)

## 2020-10-31 LAB — GLUCOSE, CAPILLARY
Glucose-Capillary: 148 mg/dL — ABNORMAL HIGH (ref 70–99)
Glucose-Capillary: 131 mg/dL — ABNORMAL HIGH (ref 70–99)
Glucose-Capillary: 256 mg/dL — ABNORMAL HIGH (ref 70–99)
Glucose-Capillary: 221 mg/dL — ABNORMAL HIGH (ref 70–99)

## 2020-10-31 LAB — HIV ANTIBODY (ROUTINE TESTING W REFLEX): HIV Screen 4th Generation wRfx: NONREACTIVE

## 2020-10-31 LAB — TYPE AND SCREEN
ABO/RH(D): A NEG
Antibody Screen: NEGATIVE

## 2020-10-31 LAB — HEMOGLOBIN AND HEMATOCRIT, BLOOD
HCT: 39.9 % (ref 36.0–46.0)
Hemoglobin: 12.8 g/dL (ref 12.0–15.0)

## 2020-10-31 SURGERY — NEPHROLITHOTOMY PERCUTANEOUS
Anesthesia: General | Site: Back | Laterality: Right

## 2020-10-31 MED ORDER — PRAVASTATIN SODIUM 40 MG PO TABS
40.0000 mg | ORAL_TABLET | Freq: Every evening | ORAL | Status: DC
  Administered 2020-10-31: 40 mg via ORAL
  Filled 2020-10-31: qty 1

## 2020-10-31 MED ORDER — ALBUTEROL SULFATE (2.5 MG/3ML) 0.083% IN NEBU: 3.0000 mL | INHALATION_SOLUTION | Freq: Four times a day (QID) | RESPIRATORY_TRACT | Status: DC | PRN

## 2020-10-31 MED ORDER — SODIUM CHLORIDE 0.9 % IR SOLN
Status: DC | PRN
  Administered 2020-10-31: 1000 mL
  Administered 2020-10-31: 6000 mL

## 2020-10-31 MED ORDER — DIPHENHYDRAMINE HCL 12.5 MG/5ML PO ELIX: 12.5000 mg | ORAL_SOLUTION | Freq: Four times a day (QID) | ORAL | Status: DC | PRN

## 2020-10-31 MED ORDER — INSULIN ASPART 100 UNIT/ML IJ SOLN
0.0000 [IU] | Freq: Three times a day (TID) | INTRAMUSCULAR | Status: DC
  Administered 2020-10-31: 8 [IU] via SUBCUTANEOUS

## 2020-10-31 MED ORDER — CHLORHEXIDINE GLUCONATE 0.12 % MT SOLN
15.0000 mL | Freq: Once | OROMUCOSAL | Status: AC
  Administered 2020-10-31: 15 mL via OROMUCOSAL

## 2020-10-31 MED ORDER — HYDROCODONE-ACETAMINOPHEN 5-325 MG PO TABS: 1.0000 | ORAL_TABLET | ORAL | 0 refills | Status: DC | PRN

## 2020-10-31 MED ORDER — ONDANSETRON HCL 4 MG/2ML IJ SOLN: 4.0000 mg | INTRAMUSCULAR | Status: DC | PRN

## 2020-10-31 MED ORDER — PROPOFOL 10 MG/ML IV BOLUS
INTRAVENOUS | Status: DC | PRN
Start: 2020-10-31 — End: 2020-10-31
  Administered 2020-10-31: 200 mg via INTRAVENOUS

## 2020-10-31 MED ORDER — MIDAZOLAM HCL 2 MG/2ML IJ SOLN
INTRAMUSCULAR | Status: AC | PRN
  Administered 2020-10-31 (×4): 1 mg via INTRAVENOUS

## 2020-10-31 MED ORDER — PROPOFOL 10 MG/ML IV BOLUS
INTRAVENOUS | Status: AC
  Filled 2020-10-31: qty 20

## 2020-10-31 MED ORDER — BELLADONNA ALKALOIDS-OPIUM 16.2-60 MG RE SUPP: 1.0000 | Freq: Four times a day (QID) | RECTAL | Status: DC | PRN

## 2020-10-31 MED ORDER — HYDROCODONE-ACETAMINOPHEN 5-325 MG PO TABS: 1.0000 | ORAL_TABLET | ORAL | Status: DC | PRN

## 2020-10-31 MED ORDER — LIDOCAINE 2% (20 MG/ML) 5 ML SYRINGE
INTRAMUSCULAR | Status: DC | PRN
Start: 2020-10-31 — End: 2020-10-31
  Administered 2020-10-31: 100 mg via INTRAVENOUS

## 2020-10-31 MED ORDER — SODIUM CHLORIDE 0.9 % IV SOLN
2.0000 g | Freq: Once | INTRAVENOUS | Status: DC
  Administered 2020-10-31: 2 g via INTRAVENOUS

## 2020-10-31 MED ORDER — SUCCINYLCHOLINE CHLORIDE 200 MG/10ML IV SOSY
PREFILLED_SYRINGE | INTRAVENOUS | Status: DC | PRN
  Administered 2020-10-31: 1140 mg via INTRAVENOUS

## 2020-10-31 MED ORDER — ACETAMINOPHEN 325 MG PO TABS
650.0000 mg | ORAL_TABLET | ORAL | Status: DC | PRN
Start: 2020-10-31 — End: 2020-11-01
  Administered 2020-10-31: 650 mg via ORAL
  Filled 2020-10-31: qty 2

## 2020-10-31 MED ORDER — LEVOTHYROXINE SODIUM 88 MCG PO TABS
88.0000 ug | ORAL_TABLET | ORAL | Status: DC
  Administered 2020-11-01: 88 ug via ORAL
  Filled 2020-10-31: qty 1

## 2020-10-31 MED ORDER — FENTANYL CITRATE (PF) 100 MCG/2ML IJ SOLN: 25.0000 ug | INTRAMUSCULAR | Status: DC | PRN

## 2020-10-31 MED ORDER — SODIUM CHLORIDE 0.9 % IV SOLN: INTRAVENOUS | Status: DC

## 2020-10-31 MED ORDER — ROCURONIUM BROMIDE 10 MG/ML (PF) SYRINGE
PREFILLED_SYRINGE | INTRAVENOUS | Status: AC
  Filled 2020-10-31: qty 10

## 2020-10-31 MED ORDER — LIDOCAINE HCL 1 % IJ SOLN
INTRAMUSCULAR | Status: AC
  Filled 2020-10-31: qty 20

## 2020-10-31 MED ORDER — LACTATED RINGERS IV SOLN: INTRAVENOUS | Status: DC

## 2020-10-31 MED ORDER — MORPHINE SULFATE (PF) 2 MG/ML IV SOLN: 2.0000 mg | INTRAVENOUS | Status: DC | PRN

## 2020-10-31 MED ORDER — SODIUM CHLORIDE 0.9 % IV SOLN
2.0000 g | INTRAVENOUS | Status: AC
  Administered 2020-10-31: 2 g via INTRAVENOUS
  Filled 2020-10-31: qty 2

## 2020-10-31 MED ORDER — FENTANYL CITRATE (PF) 250 MCG/5ML IJ SOLN
INTRAMUSCULAR | Status: DC | PRN
  Administered 2020-10-31: 50 ug via INTRAVENOUS

## 2020-10-31 MED ORDER — MEPERIDINE HCL 50 MG/ML IJ SOLN: 6.2500 mg | INTRAMUSCULAR | Status: DC | PRN

## 2020-10-31 MED ORDER — LIDOCAINE 2% (20 MG/ML) 5 ML SYRINGE
INTRAMUSCULAR | Status: AC
  Filled 2020-10-31: qty 5

## 2020-10-31 MED ORDER — ONDANSETRON HCL 4 MG/2ML IJ SOLN
INTRAMUSCULAR | Status: AC
  Filled 2020-10-31: qty 2

## 2020-10-31 MED ORDER — PROMETHAZINE HCL 25 MG/ML IJ SOLN: 6.2500 mg | INTRAMUSCULAR | Status: DC | PRN

## 2020-10-31 MED ORDER — IOHEXOL 300 MG/ML  SOLN
INTRAMUSCULAR | Status: DC | PRN
  Administered 2020-10-31: 10 mL

## 2020-10-31 MED ORDER — LORATADINE 10 MG PO TABS
10.0000 mg | ORAL_TABLET | Freq: Every day | ORAL | Status: DC
  Administered 2020-11-01: 10 mg via ORAL
  Filled 2020-10-31: qty 1

## 2020-10-31 MED ORDER — ONDANSETRON HCL 4 MG/2ML IJ SOLN
INTRAMUSCULAR | Status: DC | PRN
  Administered 2020-10-31: 4 mg via INTRAVENOUS

## 2020-10-31 MED ORDER — BACITRACIN-NEOMYCIN-POLYMYXIN 400-5-5000 EX OINT: 1.0000 "application " | TOPICAL_OINTMENT | Freq: Three times a day (TID) | CUTANEOUS | Status: DC | PRN

## 2020-10-31 MED ORDER — ZOLPIDEM TARTRATE 5 MG PO TABS: 5.0000 mg | ORAL_TABLET | Freq: Every evening | ORAL | Status: DC | PRN

## 2020-10-31 MED ORDER — ORAL CARE MOUTH RINSE: 15.0000 mL | Freq: Once | OROMUCOSAL | Status: AC

## 2020-10-31 MED ORDER — FENTANYL CITRATE (PF) 100 MCG/2ML IJ SOLN
INTRAMUSCULAR | Status: AC | PRN
  Administered 2020-10-31 (×3): 50 ug via INTRAVENOUS

## 2020-10-31 MED ORDER — DEXAMETHASONE SODIUM PHOSPHATE 10 MG/ML IJ SOLN
INTRAMUSCULAR | Status: DC | PRN
  Administered 2020-10-31: 4 mg via INTRAVENOUS

## 2020-10-31 MED ORDER — SENNA 8.6 MG PO TABS
1.0000 | ORAL_TABLET | Freq: Two times a day (BID) | ORAL | Status: DC
  Administered 2020-10-31 – 2020-11-01 (×2): 8.6 mg via ORAL
  Filled 2020-10-31 (×2): qty 1

## 2020-10-31 MED ORDER — MIDAZOLAM HCL 2 MG/2ML IJ SOLN
INTRAMUSCULAR | Status: AC
  Filled 2020-10-31: qty 2

## 2020-10-31 MED ORDER — INSULIN ASPART 100 UNIT/ML IJ SOLN
INTRAMUSCULAR | Status: AC
  Filled 2020-10-31: qty 1

## 2020-10-31 MED ORDER — CEFAZOLIN SODIUM-DEXTROSE 1-4 GM/50ML-% IV SOLN
1.0000 g | Freq: Three times a day (TID) | INTRAVENOUS | Status: AC
  Administered 2020-10-31 – 2020-11-01 (×2): 1 g via INTRAVENOUS
  Filled 2020-10-31 (×3): qty 50

## 2020-10-31 MED ORDER — DOCUSATE SODIUM 100 MG PO CAPS
100.0000 mg | ORAL_CAPSULE | Freq: Two times a day (BID) | ORAL | Status: DC
  Administered 2020-10-31 – 2020-11-01 (×2): 100 mg via ORAL
  Filled 2020-10-31 (×2): qty 1

## 2020-10-31 MED ORDER — OXYBUTYNIN CHLORIDE 5 MG PO TABS: 5.0000 mg | ORAL_TABLET | Freq: Three times a day (TID) | ORAL | Status: DC | PRN

## 2020-10-31 MED ORDER — MIDAZOLAM HCL 2 MG/2ML IJ SOLN
INTRAMUSCULAR | Status: AC
  Filled 2020-10-31: qty 6

## 2020-10-31 MED ORDER — FENTANYL CITRATE (PF) 100 MCG/2ML IJ SOLN
INTRAMUSCULAR | Status: AC
  Filled 2020-10-31: qty 2

## 2020-10-31 MED ORDER — IOHEXOL 300 MG/ML  SOLN
6.0000 mL | Freq: Once | INTRAMUSCULAR | Status: AC | PRN
  Administered 2020-10-31: 6 mL

## 2020-10-31 MED ORDER — FENTANYL CITRATE (PF) 250 MCG/5ML IJ SOLN
INTRAMUSCULAR | Status: AC
  Filled 2020-10-31: qty 5

## 2020-10-31 MED ORDER — DIPHENHYDRAMINE HCL 50 MG/ML IJ SOLN: 12.5000 mg | Freq: Four times a day (QID) | INTRAMUSCULAR | Status: DC | PRN

## 2020-10-31 MED ORDER — ONDANSETRON HCL 4 MG/2ML IJ SOLN
INTRAMUSCULAR | Status: AC | PRN
  Administered 2020-10-31: 4 mg via INTRAVENOUS

## 2020-10-31 MED ORDER — ALPRAZOLAM 0.5 MG PO TABS: 0.5000 mg | ORAL_TABLET | Freq: Every day | ORAL | Status: DC | PRN

## 2020-10-31 MED ORDER — SODIUM CHLORIDE 0.9 % IV SOLN
INTRAVENOUS | Status: AC
  Filled 2020-10-31: qty 20

## 2020-10-31 MED ORDER — DEXAMETHASONE SODIUM PHOSPHATE 10 MG/ML IJ SOLN
INTRAMUSCULAR | Status: AC
  Filled 2020-10-31: qty 1

## 2020-10-31 MED ORDER — ROCURONIUM BROMIDE 10 MG/ML (PF) SYRINGE
PREFILLED_SYRINGE | INTRAVENOUS | Status: DC | PRN
  Administered 2020-10-31: 50 mg via INTRAVENOUS

## 2020-10-31 MED ORDER — SUGAMMADEX SODIUM 200 MG/2ML IV SOLN
INTRAVENOUS | Status: DC | PRN
  Administered 2020-10-31 (×2): 200 mg via INTRAVENOUS

## 2020-10-31 SURGICAL SUPPLY — 47 items
BAG COUNTER SPONGE SURGICOUNT (BAG) IMPLANT
BAG URINE DRAIN 2000ML AR STRL (UROLOGICAL SUPPLIES) IMPLANT
BASKET ZERO TIP NITINOL 2.4FR (BASKET) IMPLANT
BENZOIN TINCTURE PRP APPL 2/3 (GAUZE/BANDAGES/DRESSINGS) ×2 IMPLANT
BLADE SURG 15 STRL LF DISP TIS (BLADE) ×1 IMPLANT
BLADE SURG 15 STRL SS (BLADE) ×2
CATH FOLEY 2W COUNCIL 20FR 5CC (CATHETERS) IMPLANT
CATH ROBINSON RED A/P 20FR (CATHETERS) IMPLANT
CATH URET DUAL LUMEN 6-10FR 50 (CATHETERS) ×2 IMPLANT
CATH X-FORCE N30 NEPHROSTOMY (TUBING) ×2 IMPLANT
CHLORAPREP W/TINT 26 (MISCELLANEOUS) ×2 IMPLANT
COVER SURGICAL LIGHT HANDLE (MISCELLANEOUS) IMPLANT
DRAPE C-ARM 42X120 X-RAY (DRAPES) ×2 IMPLANT
DRAPE LINGEMAN PERC (DRAPES) ×2 IMPLANT
DRAPE SURG IRRIG POUCH 19X23 (DRAPES) ×2 IMPLANT
DRSG PAD ABDOMINAL 8X10 ST (GAUZE/BANDAGES/DRESSINGS) ×4 IMPLANT
DRSG TEGADERM 4X4.75 (GAUZE/BANDAGES/DRESSINGS) ×1 IMPLANT
DRSG TEGADERM 8X12 (GAUZE/BANDAGES/DRESSINGS) IMPLANT
GAUZE SPONGE 4X4 12PLY STRL (GAUZE/BANDAGES/DRESSINGS) ×2 IMPLANT
GLOVE SURG ENC MOIS LTX SZ7.5 (GLOVE) ×2 IMPLANT
GOWN STRL REUS W/TWL XL LVL3 (GOWN DISPOSABLE) ×2 IMPLANT
GUIDEWIRE AMPLAZ .035X145 (WIRE) ×4 IMPLANT
KIT BASIN OR (CUSTOM PROCEDURE TRAY) ×2 IMPLANT
KIT PROBE TRILOGY 3.9X350 (MISCELLANEOUS) IMPLANT
KIT TURNOVER KIT A (KITS) ×2 IMPLANT
LASER FIB FLEXIVA PULSE ID 365 (Laser) IMPLANT
MANIFOLD NEPTUNE II (INSTRUMENTS) ×2 IMPLANT
NS IRRIG 1000ML POUR BTL (IV SOLUTION) ×2 IMPLANT
PACK CYSTO (CUSTOM PROCEDURE TRAY) ×2 IMPLANT
SPONGE T-LAP 4X18 ~~LOC~~+RFID (SPONGE) ×2 IMPLANT
STENT ENDOURETEROTOMY 7-14 26C (STENTS) IMPLANT
STENT URET 6FRX26 CONTOUR (STENTS) ×1 IMPLANT
SUT CHROMIC 3 0 SH 27 (SUTURE) IMPLANT
SUT MNCRL AB 4-0 PS2 18 (SUTURE) ×1 IMPLANT
SUT SILK 2 0 30  PSL (SUTURE)
SUT SILK 2 0 30 PSL (SUTURE) IMPLANT
SYR 10ML LL (SYRINGE) ×2 IMPLANT
SYR 20ML LL LF (SYRINGE) ×4 IMPLANT
TOWEL OR 17X26 10 PK STRL BLUE (TOWEL DISPOSABLE) ×2 IMPLANT
TOWEL OR NON WOVEN STRL DISP B (DISPOSABLE) ×2 IMPLANT
TRACTIP FLEXIVA PULS ID 200XHI (Laser) IMPLANT
TRACTIP FLEXIVA PULSE ID 200 (Laser)
TRAY FOLEY MTR SLVR 16FR STAT (SET/KITS/TRAYS/PACK) ×2 IMPLANT
TUBING CONNECTING 10 (TUBING) ×4 IMPLANT
TUBING STONE CATCHER TRILOGY (MISCELLANEOUS) IMPLANT
TUBING UROLOGY SET (TUBING) ×2 IMPLANT
WATER STERILE IRR 1000ML POUR (IV SOLUTION) ×2 IMPLANT

## 2020-10-31 NOTE — Anesthesia Postprocedure Evaluation (Signed)
Anesthesia Post Note  Patient: Allison [name]  Procedure(s) Performed: RIGHT NEPHROLITHOTOMY PERCUTANEOUS, INSERTION OF RIGHT URETERAL STENT (Right: Back)     Patient location during evaluation: PACU Anesthesia Type: General Level of consciousness: sedated and patient cooperative Pain management: pain level controlled Vital Signs Assessment: post-procedure vital signs reviewed and stable Respiratory status: spontaneous breathing Cardiovascular status: stable Anesthetic complications: no   No notable events documented.  Last Vitals:  Vitals:   10/31/20 1830 10/31/20 1852  BP: 124/81 138/84  Pulse: 81 81  Resp: 14 14  Temp:  36.6 C  SpO2: 99% 100%    Last Pain:  Vitals:   10/31/20 1852  TempSrc: Oral  PainSc:                  Nolon Nations

## 2020-10-31 NOTE — Anesthesia Procedure Notes (Addendum)
Procedure Name: Intubation Date/Time: 10/31/2020 10:56 AM Performed by: Talbot Grumbling, CRNA Pre-anesthesia Checklist: Patient identified, Patient being monitored, Timeout performed, Emergency Drugs available and Suction available Patient Re-evaluated:Patient Re-evaluated prior to induction Oxygen Delivery Method: Circle System Utilized Preoxygenation: Pre-oxygenation with 100% oxygen Induction Type: IV induction, Rapid sequence and Cricoid Pressure applied Ventilation: Mask ventilation without difficulty Laryngoscope Size: Mac and 4 Grade View: Grade I Tube type: Oral Tube size: 7.5 mm Number of attempts: 1 Airway Equipment and Method: stylet Placement Confirmation: ETT inserted through vocal cords under direct vision, positive ETCO2, breath sounds checked- equal and bilateral and CO2 detector Secured at: 21 cm Tube secured with: Tape Dental Injury: Teeth and Oropharynx as per pre-operative assessment  Comments: Intubated by Johnell Comings, SRNA.

## 2020-10-31 NOTE — Progress Notes (Signed)
Received patient from PACU with bedside report from [place], [place] obtained, oriented to unit, call light placed in reach

## 2020-10-31 NOTE — Procedures (Signed)
Interventional Radiology Procedure Note  Procedure: Right percutaneous nephroureteral catheter placement  Findings: Please refer to procedural dictation for full description.  Right lower pole access, 5 Fr catheter positioned with tip in bladder.  Complications: None immediate  Estimated Blood Loss: < 5 mL  Recommendations: To OR with Urology.   Ruthann Cancer, MD

## 2020-10-31 NOTE — Transfer of Care (Signed)
Immediate Anesthesia Transfer of Care Note  Patient: Allison [name]  Procedure(s) Performed: RIGHT NEPHROLITHOTOMY PERCUTANEOUS (Right: Back)  Patient Location: PACU  Anesthesia Type:General  Level of Consciousness: sedated  Airway & Oxygen Therapy: Patient Spontanous Breathing and Patient connected to face mask oxygen  Post-op Assessment: Report given to RN and Post -op Vital signs reviewed and stable  Post vital signs: Reviewed and stable  Last Vitals:  Vitals Value Taken Time  BP    Temp    Pulse 77 10/31/20 1155  Resp 12 10/31/20 1155  SpO2 100 % 10/31/20 1155  Vitals shown include unvalidated device data.  Last Pain:  Vitals:   10/31/20 1027  TempSrc: Oral  PainSc: 0-No pain         Complications: No notable events documented.

## 2020-10-31 NOTE — Anesthesia Preprocedure Evaluation (Signed)
Anesthesia Evaluation  Patient identified by MRN, date of birth, ID band Patient awake    Reviewed: Allergy & Precautions, NPO status , Patient's Chart, lab work & pertinent test results  Airway Mallampati: III  TM Distance: <3 FB Neck ROM: Full    Dental  (+) Dental Advisory Given, Edentulous Upper, Partial Lower, Missing   Pulmonary asthma , pneumonia, resolved, PE   Pulmonary exam normal breath sounds clear to auscultation       Cardiovascular hypertension, Pt. on medications (-) angina(-) CAD, (-) Past MI and (-) CHF Normal cardiovascular exam Rhythm:Regular Rate:Normal     Neuro/Psych PSYCHIATRIC DISORDERS Anxiety Depression negative neurological ROS     GI/Hepatic Neg liver ROS, GERD  ,  Endo/Other  diabetes, Type 2, Oral Hypoglycemic AgentsHypothyroidism Obesity   Renal/GU Renal InsufficiencyRenal disease     Musculoskeletal  (+) Arthritis , Osteoarthritis,    Abdominal (+) + obese,   Peds  Hematology  (+) Blood dyscrasia (Warfarin), anemia ,   Anesthesia Other Findings Day of surgery medications reviewed with the patient.  Reproductive/Obstetrics                             Anesthesia Physical  Anesthesia Plan  ASA: 3  Anesthesia Plan: General   Post-op Pain Management:    Induction: Intravenous  PONV Risk Score and Plan: 3 and Ondansetron, Dexamethasone and Midazolam  Airway Management Planned: Oral ETT  Additional Equipment: None  Intra-op Plan:   Post-operative Plan: Extubation in OR  Informed Consent: I have reviewed the patients History and Physical, chart, labs and discussed the procedure including the risks, benefits and alternatives for the proposed anesthesia with the patient or authorized representative who has indicated his/her understanding and acceptance.     Dental advisory given  Plan Discussed with: CRNA  Anesthesia Plan Comments:          Anesthesia Quick Evaluation

## 2020-10-31 NOTE — Op Note (Signed)
Operative Note  Preoperative diagnosis:  1.  Right renal calculus  Postoperative diagnosis: 1.  Right renal calculus   Procedure(s): 1.  Right percutaneous nephrolithotomy with antegrade ureteral stent placement  Surgeon: Link Snuffer, MD  Assistants: None  Anesthesia: General  Complications: None immediate  EBL: 50 cc  Specimens: 1.  Renal calculus  Drains/Catheters: 1.  6 x 26 double-J ureteral stent  Intraoperative findings: 1.  Nephrostogram at the beginning of the case confirmed we were in the lower pole with access.  Nephroscopy identified large stone burden that was able to be extracted.  All stone appeared to be removed.  Indication: 65 year old female with a large right renal calculus presents for the previously mentioned operation.  Description of procedure:  The patient was identified and consent was obtained.  The patient was taken to the operating room and placed in the supine position.  The patient was placed under general anesthesia.  Perioperative antibiotics were administered.  The patient was placed in prone position and all pressure points were padded.  Patient was prepped and draped in a standard sterile fashion and a timeout was performed.  A Super Stiff wire was advanced through the nephroureteral stent down to the bladder under fluoroscopic guidance and the nephroureteral stent was removed.  A dual-lumen ureteral catheter was advanced over the Super Stiff wire into the renal pelvis and an antegrade nephrostogram was performed.  This showed a well opacified kidney and a filling defect corresponding to the stone of interest.  I advanced the dual-lumen ureteral catheter into the proximal ureter under fluoroscopic guidance followed by placement of a second Super Stiff wire down to the bladder under fluoroscopic guidance.  The dual-lumen catheter was removed.  An incision was made alongside the wires.  The balloon dilator was then advanced over one of the wires and  into the renal pelvis fluoroscopic guidance and the tract was dilated to a pressure of 18.  The sheath was advanced over the balloon and into the renal pelvis.  The balloon was withdrawn keeping the sheath in place.  The nephroscope was advanced into the kidney and the stone of interest was encountered.  The stone was then broken and removed with graspers.All stone was removed and there was no evidence of any other stones within the kidney.  The nephroscope along with the sheath were withdrawn.    Skin A 6 x 26 double-J ureteral stent was advanced over 1 of the wires under fluoroscopic guidance and the wire was withdrawn.  Fluoroscopy confirmed a good coil within the bladder as well as a good coil in the renal pelvis proximally.  The incision was closed with a running 4-0 Monocryl.   This concluded the operation.  The patient tolerated procedure well and was stable postoperatively.  Plan: Patient will remain under observation overnight. Stent to be removed in 1-2 weeks.

## 2020-10-31 NOTE — H&P (Signed)
Referring Physician(s): Bell,E  Supervising Physician: Ruthann Cancer  Patient Status:  [facility] TBA  Chief Complaint:  Right renal stone  Subjective: Patient familiar to IR service from right nephroureteral catheter placement in 2018.  She has a history of symptomatic nephrolithiasis with CT on 09/06/2020 revealing bilateral nonobstructing renal stones including staghorn calculi in the lower poles of both kidneys.  She presents today for right percutaneous nephrostomy/nephroureteral catheter placement prior to nephrolithotomy.  Additional history as below.  She currently denies fever, headache, chest pain, dyspnea, cough, abdominal/back pain, nausea, vomiting, hematuria, dysuria.  She is anxious.  Past Medical History:  Diagnosis Date   Anxiety    Arthritis    "knees" (06/04/2016)   Asthma    seasonal   Chronic bronchitis (HCC)    History of anemia    History of gout    History of kidney stones    Hyperlipidemia    Hypertension    Doctor discontinued medication after weight loss   Hypothyroidism    Pneumonia ~ 2013; 09/2015; 03/2016   Pulmonary embolism ([place]) ~ 2013   "2 big ones",    Thyroid goiter    Type II diabetes mellitus ([place])    Uterine cancer ([place]) 2000   Hysterectomy   Ventral hernia    Past Surgical History:  Procedure Laterality Date   CHOLECYSTECTOMY N/A 06/04/2016   Procedure: LAPAROSCOPIC CHOLECYSTECTOMY;  Surgeon: Rolm Bookbinder, MD;  Location: [address];  Service: General;  Laterality: N/A;  LAPAROSCOPIC CHOLECYSTECTOMY   CYSTOSCOPY W/ RETROGRADES Bilateral 12/18/2016   Procedure: CYSTOSCOPY WITH RETROGRADE PYELOGRAM;  Surgeon: Nickie Retort, MD;  Location: [facility];  Service: Urology;  Laterality: Bilateral;   CYSTOSCOPY W/ URETERAL STENT REMOVAL Right 12/18/2016   Procedure: CYSTOSCOPY WITH STENT REMOVAL;  Surgeon: Nickie Retort, MD;  Location: [facility];  Service: Urology;  Laterality: Right;   CYSTOSCOPY WITH  RETROGRADE PYELOGRAM, URETEROSCOPY AND STENT PLACEMENT Right 04/17/2016   Procedure: CYSTOSCOPY WITH RETROGRADE PYELOGRAM, URETEROSCOPY AND STENT PLACEMENT;  Surgeon: Nickie Retort, MD;  Location[facility];  Service: Urology;  Laterality: Right;   CYSTOSCOPY/URETEROSCOPY/HOLMIUM LASER/STENT PLACEMENT Bilateral 12/18/2016   Procedure: CYSTOSCOPY/URETEROSCOPY/HOLMIUM LASER/    BILATERAL STENT PLACEMENT;  Surgeon: Nickie Retort, MD;  [facility];  Service: Urology;  Laterality: Bilateral;   DILATION AND CURETTAGE OF UTERUS     S/P miscarriage   HOLMIUM LASER APPLICATION Bilateral [id number]   Procedure: HOLMIUM LASER APPLICATION;  Surgeon: Nickie Retort, MD;  Location: [facility];  Service: Urology;  Laterality: Bilateral;   IR URETERAL STENT RIGHT NEW ACCESS W/O SEP NEPHROSTOMY CATH  11/20/2016   LAPAROSCOPIC CHOLECYSTECTOMY  06/04/2016   NEPHROLITHOTOMY Right 11/20/2016   Procedure: NEPHROLITHOTOMY PERCUTANEOUS;  Surgeon: Nickie Retort, MD;  [facility];  Service: Urology;  Laterality: Right;   TONSILLECTOMY     TOTAL ABDOMINAL HYSTERECTOMY  2000s   "endometrial CA"   TOTAL THYROIDECTOMY  2012   "had goiter on it"-one lobe removed      Allergies: Oxycodone, Simvastatin, and Tramadol hcl  Medications: Prior to Admission medications   Medication Sig Start Date End Date Taking? Authorizing Provider  albuterol (PROAIR HFA) 108 (90 Base) MCG/ACT inhaler USE 2 PUFFS BY MOUTH EVERY  6 HOURS AS NEEDED FOR  WHEEZING OR SHORTNESS OF  BREATH Patient taking differently: Inhale 2 puffs into the lungs every 6 (six) hours as needed for shortness of breath or wheezing. 01/13/20  Yes  [name], MD  ALPRAZolam Duanne Moron) 0.5 MG tablet Take 1 tablet (0.5 mg total) by mouth daily as needed for anxiety. 09/15/20  Yes [name], MD  cetirizine (ZYRTEC) 10 MG tablet Take 1 tablet (10 mg total) by mouth daily. 08/15/20  Yes Simmons-Robinson,  Riki Sheer, MD  glyBURIDE (DIABETA) 5 MG tablet Take 2 tablets (10 mg total) by mouth daily with breakfast. Patient taking differently: Take 5 mg by mouth 2 (two) times daily with a meal. 09/15/20  Yes [name], MD  levothyroxine (SYNTHROID) 88 MCG tablet Take 1 tablet (88 mcg total) by mouth every morning. 30 minutes before food 08/18/20  Yes Simmons-Robinson, Makiera, MD  metFORMIN (GLUCOPHAGE) 1000 MG tablet Take 1 tablet (1,000 mg total) by mouth 2 (two) times daily. 08/15/20  Yes Simmons-Robinson, Makiera, MD  pravastatin (PRAVACHOL) 40 MG tablet Take 1 tablet (40 mg total) by mouth every evening. 01/13/20 01/12/21 Yes [name], MD  calcium carbonate (OSCAL) 1500 (600 Ca) MG TABS tablet Take 1 tablet (1,500 mg total) by mouth 2 (two) times daily with a meal. Patient not taking: No sig reported 07/16/19   [name], MD  cephALEXin (KEFLEX) 500 MG capsule Take 1 capsule (500 mg total) by mouth 3 (three) times daily. 08/15/20   Simmons-Robinson, Riki Sheer, MD  albuterol (PROVENTIL,VENTOLIN) 90 MCG/ACT inhaler Inhale 2 puffs into the lungs every 6 (six) hours as needed. 10/23/10 11/13/11  Martinique, Sarah T, MD     Vital Signs: Vitals:   10/31/20 0750  BP: (!) 146/78  Pulse: 84  Resp: 18  Temp: 97.8 F (36.6 C)  SpO2: 97%      Physical Exam awake, alert.  Chest clear to auscultation bilaterally.  Heart with regular rate and rhythm.  Abdomen soft, positive bowel sounds, nontender.  No lower extremity edema.  Imaging: No results found.  Labs:  CBC: Recent Labs    01/12/20 1624 01/15/20 2200 09/06/20 1108 10/19/20 0859  WBC 6.9 6.1 7.4 4.4  HGB 14.6 14.5 14.2 14.1  HCT 42.3 44.7 43.1 43.3  PLT 218 210 215 193    COAGS: No results for input(s): INR, APTT in the last 8760 hours.  BMP: Recent Labs    01/15/20 2200 08/15/20 1223 09/06/20 1108 10/19/20 0859  NA 140 140 137 142  K 3.4* 4.1 3.7 4.0  CL 97* 99 97* 102  CO2 '27 21 30 29  '$ GLUCOSE 143* 104* 158* 115*   BUN '17 17 15 23  '$ CALCIUM 8.3* 7.8* 9.0 8.0*  CREATININE 1.22* 1.04* 1.10* 1.00  GFRNONAA 47*  --  56* >60    LIVER FUNCTION TESTS: Recent Labs    08/15/20 1223 09/06/20 1108  BILITOT 0.6 1.1  AST 17 15  ALT 10 12  ALKPHOS 58 56  PROT 6.8 7.7  ALBUMIN 4.5 3.8    Assessment and Plan: Patient familiar to IR service from right nephroureteral catheter placement in 2018.  She has a history of symptomatic nephrolithiasis with CT on 09/06/2020 revealing bilateral nonobstructing renal stones including staghorn calculi in the lower poles of both kidneys.  She presents today for right percutaneous nephrostomy/nephroureteral catheter placement prior to nephrolithotomy.Risks and benefits of right PCN placement was discussed with the patient including, but not limited to, infection, bleeding, significant bleeding causing loss or decrease in renal function or damage to adjacent structures.   All of the patient's questions were answered, patient is agreeable to proceed.  Consent signed and in chart.      Electronically Signed:  D. Rowe Robert, PA-C 10/31/2020, 8:25 AM   I spent a total of 20 minutes at the the patient's bedside AND on the patient's hospital floor or unit, greater than 50% of which was counseling/coordinating care for right percutaneous nephrostomy/nephroureteral catheter placement

## 2020-10-31 NOTE — H&P (Signed)
H&P  Chief Complaint: Renal calculus  History of Present Illness: 65 year old female with a history nephrolithiasis. She has required PCNL in the past. She has a history of uric acid stones. She underwent a CT of the abdomen and pelvis without contrast for right-sided abdominal pain. She was found to have a 2.6 cm right lower pole calculus. She also had some nonobstructing left renal calculi measuring up to 0.8 mm. patient's right-sided flank pain has improved. She denies any hematuria dysuria. Unable to leave a urine sample.    Past Medical History:  Diagnosis Date   Anxiety    Arthritis    "knees" (06/04/2016)   Asthma    seasonal   Chronic bronchitis (HCC)    History of anemia    History of gout    History of kidney stones    Hyperlipidemia    Hypertension    Doctor discontinued medication after weight loss   Hypothyroidism    Pneumonia ~ 2013; 09/2015; 03/2016   Pulmonary embolism ([place]) ~ 2013   "2 big ones",    Thyroid goiter    [place])    Uterine cancer ([place]) 2000   Hysterectomy   Ventral hernia    Past Surgical History:  Procedure Laterality Date   CHOLECYSTECTOMY N/A 06/04/2016   Procedure: LAPAROSCOPIC CHOLECYSTECTOMY;  Surgeon: Rolm Bookbinder, MD;  Location: [facility];  Service: General;  Laterality: N/A;  LAPAROSCOPIC CHOLECYSTECTOMY   CYSTOSCOPY W/ RETROGRADES Bilateral 12/18/2016   Procedure: CYSTOSCOPY WITH RETROGRADE PYELOGRAM;  Surgeon: Nickie Retort, MD;  Location: [facility];  Service: Urology;  Laterality: Bilateral;   CYSTOSCOPY W/ URETERAL STENT REMOVAL Right 12/18/2016   Procedure: CYSTOSCOPY WITH STENT REMOVAL;  Surgeon: Nickie Retort, MD;  Location: [facility];  Service: Urology;  Laterality: Right;   CYSTOSCOPY WITH RETROGRADE PYELOGRAM, URETEROSCOPY AND STENT PLACEMENT Right 04/17/2016   Procedure: CYSTOSCOPY WITH RETROGRADE PYELOGRAM, URETEROSCOPY AND STENT PLACEMENT;  Surgeon: Nickie Retort, MD;  [facility];  Service: Urology;  Laterality: Right;   CYSTOSCOPY/URETEROSCOPY/HOLMIUM LASER/STENT PLACEMENT Bilateral 12/18/2016   Procedure: CYSTOSCOPY/URETEROSCOPY/HOLMIUM LASER/    BILATERAL STENT PLACEMENT;  Surgeon: Nickie Retort, MD;  Location: [facility];  Service: Urology;  Laterality: Bilateral;   DILATION AND CURETTAGE OF UTERUS     S/P miscarriage   HOLMIUM LASER APPLICATION Bilateral 99991111   Procedure: HOLMIUM LASER APPLICATION;  Surgeon: Nickie Retort, MD;  Location: [facility];  Service: Urology;  Laterality: Bilateral;   IR URETERAL STENT RIGHT NEW ACCESS W/O SEP NEPHROSTOMY CATH  11/20/2016   LAPAROSCOPIC CHOLECYSTECTOMY  06/04/2016   NEPHROLITHOTOMY Right 11/20/2016   Procedure: NEPHROLITHOTOMY PERCUTANEOUS;  Surgeon: Nickie Retort, MD;  [facility];  Service: Urology;  Laterality: Right;   TONSILLECTOMY     TOTAL ABDOMINAL HYSTERECTOMY  2000s   "endometrial CA"   TOTAL THYROIDECTOMY  2012   "had goiter on it"-one lobe removed    Home Medications:  Medications Prior to Admission  Medication Sig Dispense Refill Last Dose   albuterol (PROAIR HFA) 108 (90 Base) MCG/ACT inhaler USE 2 PUFFS BY MOUTH EVERY  6 HOURS AS NEEDED FOR  WHEEZING OR SHORTNESS OF  BREATH (Patient taking differently: Inhale 2 puffs into the lungs every 6 (six) hours as needed for shortness of breath or wheezing.) 34 g 0 Past Month   ALPRAZolam (XANAX) 0.5 MG tablet Take 1 tablet (0.5 mg total) by mouth daily as needed for anxiety.  30 tablet 0 10/31/2020 at 0700   cetirizine (ZYRTEC) 10 MG tablet Take 1 tablet (10 mg total) by mouth daily. 90 tablet 1 10/31/2020   glyBURIDE (DIABETA) 5 MG tablet Take 2 tablets (10 mg total) by mouth daily with breakfast. (Patient taking differently: Take 5 mg by mouth 2 (two) times daily with a meal.) 180 tablet 1 10/30/2020   levothyroxine (SYNTHROID) 88 MCG tablet Take 1 tablet (88 mcg total) by mouth  every morning. 30 minutes before food 90 tablet 1 10/31/2020   metFORMIN (GLUCOPHAGE) 1000 MG tablet Take 1 tablet (1,000 mg total) by mouth 2 (two) times daily. 180 tablet 1 10/30/2020   pravastatin (PRAVACHOL) 40 MG tablet Take 1 tablet (40 mg total) by mouth every evening. 30 tablet 11 10/30/2020   calcium carbonate (OSCAL) 1500 (600 Ca) MG TABS tablet Take 1 tablet (1,500 mg total) by mouth 2 (two) times daily with a meal. (Patient not taking: No sig reported) 180 tablet 3    cephALEXin (KEFLEX) 500 MG capsule Take 1 capsule (500 mg total) by mouth 3 (three) times daily. 21 capsule 0    Allergies:  Allergies  Allergen Reactions   Oxycodone Nausea And Vomiting   Simvastatin Other (See Comments)    REACTION: Severe leg pain   Tramadol Hcl Nausea And Vomiting    Family History  Adopted: Yes  Problem Relation Age of Onset   CAD Brother 74   Social History:  reports that she has never smoked. She has never used smokeless tobacco. She reports that she does not drink alcohol and does not use drugs.  ROS: A complete review of systems was performed.  All systems are negative except for pertinent findings as noted. ROS   Physical Exam:  Vital signs in last 24 hours: Temp:  [97.8 F (36.6 C)] 97.8 F (36.6 C) (08/01 0750) Pulse Rate:  [84-96] 86 (08/01 0955) Resp:  [15-24] 15 (08/01 0955) BP: (135-164)/(78-84) 135/79 (08/01 0955) SpO2:  [97 %-100 %] 100 % (08/01 0955) Weight:  [89.4 kg-96.2 kg] 96.2 kg (08/01 0843) General:  Alert and oriented, No acute distress HEENT: Normocephalic, atraumatic Neck: No JVD or lymphadenopathy Cardiovascular: Regular rate and rhythm Lungs: Regular rate and effort Abdomen: Soft, nontender, nondistended, no abdominal masses Back: No CVA tenderness Extremities: No edema Neurologic: Grossly intact  Laboratory Data:  Results for orders placed or performed during the hospital encounter of 10/31/20 (from the past 24 hour(s))  Glucose, capillary      Status: Abnormal   Collection Time: 10/31/20  8:19 AM  Result Value Ref Range   Glucose-Capillary 148 (H) 70 - 99 mg/dL   Comment 1 Notify RN   CBC with Differential/Platelet     Status: None   Collection Time: 10/31/20  8:40 AM  Result Value Ref Range   WBC 5.7 4.0 - 10.5 K/uL   RBC 4.82 3.87 - 5.11 MIL/uL   Hemoglobin 14.7 12.0 - 15.0 g/dL   HCT 44.8 36.0 - 46.0 %   MCV 92.9 80.0 - 100.0 fL   MCH 30.5 26.0 - 34.0 pg   MCHC 32.8 30.0 - 36.0 g/dL   RDW 13.2 11.5 - 15.5 %   Platelets 181 150 - 400 K/uL   nRBC 0.0 0.0 - 0.2 %   Neutrophils Relative % 64 %   Neutro Abs 3.7 1.7 - 7.7 K/uL   Lymphocytes Relative 27 %   Lymphs Abs 1.5 0.7 - 4.0 K/uL   Monocytes Relative 7 %  Monocytes Absolute 0.4 0.1 - 1.0 K/uL   Eosinophils Relative 2 %   Eosinophils Absolute 0.1 0.0 - 0.5 K/uL   Basophils Relative 0 %   Basophils Absolute 0.0 0.0 - 0.1 K/uL   Immature Granulocytes 0 %   Abs Immature Granulocytes 0.02 0.00 - 0.07 K/uL  Basic metabolic panel     Status: Abnormal   Collection Time: 10/31/20  8:40 AM  Result Value Ref Range   Sodium 141 135 - 145 mmol/L   Potassium 3.8 3.5 - 5.1 mmol/L   Chloride 100 98 - 111 mmol/L   CO2 29 22 - 32 mmol/L   Glucose, Bld 159 (H) 70 - 99 mg/dL   BUN 19 8 - 23 mg/dL   Creatinine, Ser 1.05 (H) 0.44 - 1.00 mg/dL   Calcium 7.9 (L) 8.9 - 10.3 mg/dL   GFR, Estimated 59 (L) >60 mL/min   Anion gap 12 5 - 15  Protime-INR     Status: None   Collection Time: 10/31/20  8:40 AM  Result Value Ref Range   Prothrombin Time 12.5 11.4 - 15.2 seconds   INR 0.9 0.8 - 1.2   Recent Results (from the past 240 hour(s))  SARS CORONAVIRUS 2 (TAT 6-24 HRS) Nasopharyngeal Nasopharyngeal Swab     Status: None   Collection Time: 10/28/20 10:51 AM   Specimen: Nasopharyngeal Swab  Result Value Ref Range Status   SARS Coronavirus 2 NEGATIVE NEGATIVE Final    Comment: (NOTE) SARS-CoV-2 target nucleic acids are NOT DETECTED.  The SARS-CoV-2 RNA is generally  detectable in upper and lower respiratory specimens during the acute phase of infection. Negative results do not preclude SARS-CoV-2 infection, do not rule out co-infections with other pathogens, and should not be used as the sole basis for treatment or other patient management decisions. Negative results must be combined with clinical observations, patient history, and epidemiological information. The expected result is Negative.  Fact Sheet for Patients: SugarRoll.be  Fact Sheet for Healthcare Providers: https://www.woods-mathews.com/  This test is not yet approved or cleared by the [place] FDA and  has been authorized for detection and/or diagnosis of SARS-CoV-2 by FDA under an Emergency Use Authorization (EUA). This EUA will remain  in effect (meaning this test can be used) for the duration of the COVID-19 declaration under Se ction 564(b)(1) of the Act, 21 U.S.C. section 360bbb-3(b)(1), unless the authorization is terminated or revoked sooner.  Performed at [facility], [place] [address], [place], [place] [address]    Creatinine: Recent Labs    10/31/20 0840  CREATININE 1.05*    Impression/Assessment:  Renal calculus  Plan:  Proceed with right PCNL.  Risk and benefits discussed as previously documented.  Marton Redwood, III 10/31/2020, 10:10 AM

## 2020-10-31 NOTE — Discharge Instructions (Signed)
Discharge instructions following PCNL ° °Call your doctor for: °Fevers greater than 100.5 °Severe nausea or vomiting °Increasing pain not controlled by pain medication °Increasing redness or drainage from incisions °Decreased urine output or a catheter is no longer draining ° °The number for questions is [phone]. ° °Activity: °Gradually increase activity with short frequent walks, 3-4 times a day.  Avoid strenuous activities, like sports, lawn-mowing, or heavy lifting (more than 10-15 pounds).  Wear loose, comfortable clothing that pull or kink the tube or tubes.  Do not drive while taking pain medication, or until your doctor permitts it. ° °Bathing and dressing changes: °You should not shower for 48 hours after surgery.  Do not soak your back in a bathtub. ° °Drainage bag care: °You may be discharged with a drainage bag around the site of your surgery.  The drainage bag should be secured such that it never pulls or loosens to prevent it from leaking.  It is important to wash her hands before and after emptying the drainage bag to help prevent the spread of infection.  The drainage bag should be emptied as needed.  When the wound stops draining or it is manageable with a dry gauze dressing, you can remove the bag. ° °If your tube in the back was removed, you should expect to have some leakage of fluid from the back incision.  This should slowly decrease and stop over the next couple of days.  If you have severe pain or persistent leakage, please call the number above.  Otherwise, your dressing can be changed 1-2 times daily or more if needed. ° °Diet: °It is extremely important to drink plenty of fluids after surgery, especially water.  You may resume your regular diet, unless otherwise instructed. ° °Medications: °May take Tylenol (acetaminophen) or ibuprofen (Advil, Motrin) as directed over-the-counter. °Take any prescriptions as directed. ° °Follow-up appointments: °Follow-up appointment will be scheduled  with Dr. Jaelle Campanile °

## 2020-11-01 ENCOUNTER — Encounter (HOSPITAL_COMMUNITY): Payer: Self-pay | Admitting: Urology

## 2020-11-01 DIAGNOSIS — J45909 Unspecified asthma, uncomplicated: Secondary | ICD-10-CM | POA: Diagnosis not present

## 2020-11-01 DIAGNOSIS — E039 Hypothyroidism, unspecified: Secondary | ICD-10-CM | POA: Diagnosis not present

## 2020-11-01 DIAGNOSIS — I10 Essential (primary) hypertension: Secondary | ICD-10-CM | POA: Diagnosis not present

## 2020-11-01 DIAGNOSIS — Z79899 Other long term (current) drug therapy: Secondary | ICD-10-CM | POA: Diagnosis not present

## 2020-11-01 DIAGNOSIS — Z7984 Long term (current) use of oral hypoglycemic drugs: Secondary | ICD-10-CM | POA: Diagnosis not present

## 2020-11-01 DIAGNOSIS — E119 Type 2 diabetes mellitus without complications: Secondary | ICD-10-CM | POA: Diagnosis not present

## 2020-11-01 DIAGNOSIS — Z8542 Personal history of malignant neoplasm of other parts of uterus: Secondary | ICD-10-CM | POA: Diagnosis not present

## 2020-11-01 DIAGNOSIS — N200 Calculus of kidney: Secondary | ICD-10-CM | POA: Diagnosis not present

## 2020-11-01 DIAGNOSIS — Z862 Personal history of diseases of the blood and blood-forming organs and certain disorders involving the immune mechanism: Secondary | ICD-10-CM | POA: Diagnosis not present

## 2020-11-01 LAB — HEMOGLOBIN AND HEMATOCRIT, BLOOD
HCT: 34.7 % — ABNORMAL LOW (ref 36.0–46.0)
Hemoglobin: 11.4 g/dL — ABNORMAL LOW (ref 12.0–15.0)

## 2020-11-01 LAB — GLUCOSE, CAPILLARY: Glucose-Capillary: 112 mg/dL — ABNORMAL HIGH (ref 70–99)

## 2020-11-01 LAB — BASIC METABOLIC PANEL
Anion gap: 10 (ref 5–15)
BUN: 20 mg/dL (ref 8–23)
CO2: 30 mmol/L (ref 22–32)
Calcium: 7.6 mg/dL — ABNORMAL LOW (ref 8.9–10.3)
Chloride: 101 mmol/L (ref 98–111)
Creatinine, Ser: 1.03 mg/dL — ABNORMAL HIGH (ref 0.44–1.00)
GFR, Estimated: >60 mL/min (ref >60)
Glucose, Bld: 137 mg/dL — ABNORMAL HIGH (ref 70–99)
Potassium: 3.7 mmol/L (ref 3.5–5.1)
Sodium: 141 mmol/L (ref 135–145)

## 2020-11-01 MED ORDER — CHLORHEXIDINE GLUCONATE CLOTH 2 % EX PADS: 6.0000 | MEDICATED_PAD | Freq: Every day | CUTANEOUS | Status: DC

## 2020-11-01 NOTE — Discharge Summary (Addendum)
[facility] Discharge Summary  Admit date: 10/31/2020  Discharge date and time: 11/01/20   Discharge to: Home  Discharge Service: Urology  Discharge Attending Physician: Link Snuffer, MD  Discharge  Diagnoses: Lithiasis  Secondary Diagnosis: Active Problems:   Renal calculi   OR Procedures: Procedure(s): RIGHT NEPHROLITHOTOMY PERCUTANEOUS, INSERTION OF RIGHT URETERAL STENT 10/31/2020   Ancillary Procedures: None   Discharge Day Services: The patient was seen and examined by the Urology team both in the morning and immediately prior to discharge.  Vital signs and laboratory values were stable and within normal limits.  The physical exam was benign and unchanged and all surgical wounds were examined.  Discharge instructions were explained and all questions answered.  Subjective  No acute events overnight. Pain Controlled. No fever or chills.  Objective Patient Vitals for the past 8 hrs:  BP Temp Temp src Pulse Resp SpO2  11/01/20 0644 117/69 (!) 97.4 F (36.3 C) Oral 69 18 100 %  11/01/20 0305 102/62 98.2 F (36.8 C) Oral 69 18 99 %   No intake/output data recorded.  General Appearance:        No acute distress Lungs:                       Normal work of breathing on room air Heart:                                Regular rate and rhythm Abdomen:                         Soft, non-tender, non-distended incisions clean dry and intact Extremities:                      Warm and well perfused   Hospital Course:  The patient underwent right percutaneous nephrostolithotomy and antegrade stent placement on 10/31/2020.  The patient tolerated the procedure well, was extubated in the OR, and afterwards was taken to the PACU for routine post-surgical care. When stable the patient was transferred to the floor.   The patient did well postoperatively.  The patient's diet was slowly advanced and at the time of discharge was tolerating a regular diet.  The patient was discharged home 1 Day  Post-Op, at which point was tolerating a regular solid diet, was able to void spontaneously, have adequate pain control with P.O. pain medication, and could ambulate without difficulty. The patient will follow up with [place] for post op check.  11/09/2020 for stent removal  Condition at Discharge: Improved  Discharge Medications:  Allergies as of 11/01/2020       Reactions   Oxycodone Nausea And Vomiting   Simvastatin Other (See Comments)   REACTION: Severe leg pain   Tramadol Hcl Nausea And Vomiting        Medication List     TAKE these medications    ALPRAZolam 0.5 MG tablet Commonly known as: XANAX Take 1 tablet (0.5 mg total) by mouth daily as needed for anxiety.   cephALEXin 500 MG capsule Commonly known as: KEFLEX Take 1 capsule (500 mg total) by mouth 3 (three) times daily.   cetirizine 10 MG tablet Commonly known as: ZYRTEC Take 1 tablet (10 mg total) by mouth daily.   HYDROcodone-acetaminophen 5-325 MG tablet Commonly known as: NORCO/VICODIN Take 1 tablet by mouth every 4 (four) hours as needed for moderate pain.  levothyroxine 88 MCG tablet Commonly known as: SYNTHROID Take 1 tablet (88 mcg total) by mouth every morning. 30 minutes before food   metFORMIN 1000 MG tablet Commonly known as: GLUCOPHAGE Take 1 tablet (1,000 mg total) by mouth 2 (two) times daily.   pravastatin 40 MG tablet Commonly known as: Pravachol Take 1 tablet (40 mg total) by mouth every evening.       ASK your doctor about these medications    albuterol 108 (90 Base) MCG/ACT inhaler Commonly known as: ProAir HFA USE 2 PUFFS BY MOUTH EVERY  6 HOURS AS NEEDED FOR  WHEEZING OR SHORTNESS OF  BREATH   calcium carbonate 1500 (600 Ca) MG Tabs tablet Commonly known as: OSCAL Take 1 tablet (1,500 mg total) by mouth 2 (two) times daily with a meal.   glyBURIDE 5 MG tablet Commonly known as: DIABETA Take 2 tablets (10 mg total) by mouth daily with breakfast.

## 2020-11-09 DIAGNOSIS — N200 Calculus of kidney: Secondary | ICD-10-CM | POA: Diagnosis not present

## 2020-11-24 ENCOUNTER — Other Ambulatory Visit: Payer: Self-pay | Admitting: Family Medicine

## 2020-11-24 DIAGNOSIS — F411 Generalized anxiety disorder: Secondary | ICD-10-CM

## 2020-12-07 DIAGNOSIS — N200 Calculus of kidney: Secondary | ICD-10-CM | POA: Diagnosis not present

## 2020-12-08 DIAGNOSIS — R8271 Bacteriuria: Secondary | ICD-10-CM | POA: Diagnosis not present

## 2021-01-14 ENCOUNTER — Other Ambulatory Visit: Payer: Self-pay | Admitting: Family Medicine

## 2021-01-14 DIAGNOSIS — F411 Generalized anxiety disorder: Secondary | ICD-10-CM

## 2021-01-18 ENCOUNTER — Telehealth: Payer: Self-pay | Admitting: *Deleted

## 2021-01-18 NOTE — Telephone Encounter (Signed)
Received fax from pharmacy saying that Euthyrox is not available, can we change to a new manufacturer.  If this change is appropriate please contact pharmacy and let them know, routing to doctor who sent in Rx. Marland KitchenApril Zimmerman Rumple, CMA

## 2021-01-20 ENCOUNTER — Other Ambulatory Visit: Payer: Self-pay

## 2021-01-23 MED ORDER — PRAVASTATIN SODIUM 40 MG PO TABS: 40.0000 mg | ORAL_TABLET | Freq: Every evening | ORAL | 11 refills | Status: DC

## 2021-01-23 NOTE — Telephone Encounter (Signed)
Patient called to inquire about medication refill.  I explained to her that someone is covering for Dr. Posey Pronto and that the request from Friday likely wouldn't have been addressed over the weekend.  Will forward to covering provider.  Patient voiced understanding. Onya Eutsler,CMA

## 2021-03-17 ENCOUNTER — Other Ambulatory Visit: Payer: Self-pay | Admitting: Family Medicine

## 2021-03-17 DIAGNOSIS — E119 Type 2 diabetes mellitus without complications: Secondary | ICD-10-CM

## 2021-03-20 ENCOUNTER — Other Ambulatory Visit: Payer: Self-pay

## 2021-03-20 DIAGNOSIS — F411 Generalized anxiety disorder: Secondary | ICD-10-CM

## 2021-03-22 DIAGNOSIS — J0140 Acute pansinusitis, unspecified: Secondary | ICD-10-CM | POA: Diagnosis not present

## 2021-03-22 MED ORDER — GLYBURIDE 5 MG PO TABS: 10.0000 mg | ORAL_TABLET | Freq: Every day | ORAL | 0 refills | Status: DC

## 2021-03-22 MED ORDER — ALPRAZOLAM 0.5 MG PO TABS: 0.5000 mg | ORAL_TABLET | Freq: Every day | ORAL | 0 refills | Status: DC | PRN
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
## 2021-04-04 ENCOUNTER — Encounter: Payer: Self-pay | Admitting: Student

## 2021-04-04 ENCOUNTER — Ambulatory Visit (INDEPENDENT_AMBULATORY_CARE_PROVIDER_SITE_OTHER): Payer: Medicare Other | Admitting: Student

## 2021-04-04 ENCOUNTER — Other Ambulatory Visit: Payer: Self-pay

## 2021-04-04 VITALS — BP 126/56 | HR 92 | Ht 66.0 in | Wt 221.2 lb

## 2021-04-04 DIAGNOSIS — E890 Postprocedural hypothyroidism: Secondary | ICD-10-CM

## 2021-04-04 DIAGNOSIS — R5383 Other fatigue: Secondary | ICD-10-CM

## 2021-04-04 DIAGNOSIS — Z0000 Encounter for general adult medical examination without abnormal findings: Secondary | ICD-10-CM | POA: Diagnosis not present

## 2021-04-04 DIAGNOSIS — E1169 Type 2 diabetes mellitus with other specified complication: Secondary | ICD-10-CM

## 2021-04-04 DIAGNOSIS — Z23 Encounter for immunization: Secondary | ICD-10-CM

## 2021-04-04 DIAGNOSIS — E785 Hyperlipidemia, unspecified: Secondary | ICD-10-CM | POA: Diagnosis not present

## 2021-04-04 DIAGNOSIS — E039 Hypothyroidism, unspecified: Secondary | ICD-10-CM

## 2021-04-04 DIAGNOSIS — J45901 Unspecified asthma with (acute) exacerbation: Secondary | ICD-10-CM

## 2021-04-04 DIAGNOSIS — E118 Type 2 diabetes mellitus with unspecified complications: Secondary | ICD-10-CM

## 2021-04-04 LAB — POCT GLYCOSYLATED HEMOGLOBIN (HGB A1C): HbA1c, POC (controlled diabetic range): 6.5 % (ref 0.0–7.0)

## 2021-04-04 MED ORDER — ALBUTEROL SULFATE HFA 108 (90 BASE) MCG/ACT IN AERS: INHALATION_SPRAY | RESPIRATORY_TRACT | 0 refills | Status: DC

## 2021-04-04 MED ORDER — ZOSTER VAC RECOMB ADJUVANTED 50 MCG/0.5ML IM SUSR: 0.5000 mL | Freq: Once | INTRAMUSCULAR | 0 refills | Status: AC

## 2021-04-04 NOTE — Assessment & Plan Note (Signed)
Patient was on a higher dose of Synthroid in the past.  Recheck TSH today.  May be a component of recently worsened fatigue.
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
## 2021-04-04 NOTE — Assessment & Plan Note (Signed)
Due for lipid panel today.

## 2021-04-04 NOTE — Patient Instructions (Addendum)
It was great to see you today! Thank you for choosing [facility] for your primary care. Allison [name] was seen for fatigue, hand/leg swelling.  Our plans for today were:  -Fatigue: Rechecking TSH and CBC.  I also believe that there are family components that are impacting this.  I have attached a list of counseling resources should you decide to consider these in the future.  I would like to follow-up with you in 1 month for this. -Hand and leg swelling: These are stable and do not see any red flags at this time regarding these.  Should you start to feel short of breath or develop worsening, please return.  Believe these to be due to your arthritis -Diabetes: Your A1c returned at 6.5.  This appears well controlled.  Please continue to eat a healthy varied diet and continue to take your diabetes medications. -Asthma: I refilled your albuterol inhaler -Blood pressure: Your recheck blood pressure is appropriate and I do not feel that there is any further management for this yet at this time. -Other: I have submitted a shingles vaccine to the [place] for you as well  We are checking some labs today. If they are abnormal, I will call you. If they are normal, I will send you a MyChart message or a letter. If you do not hear about your labs in the next 2 weeks, please let us know.   You should return to our clinic in 1 month for low mood follow-up.   I recommend that you always bring your medications to each appointment as this makes it easy to ensure you are on the correct medications and helps [place] not miss refills when you need them.  Please arrive 15 minutes before your appointment to ensure smooth check in process.  We appreciate your efforts in making this happen.  Take care and seek immediate care sooner if you develop any concerns.   Thank you for allowing me to participate in your care, [name], DO 04/04/2021, 11:42 AM PGY-1, [facility]    Therapy and Counseling Resources Most providers on this list will take Medicaid. Patients with commercial insurance or Medicare should contact their insurance company to get a list of in network providers.  [facility]:Psychiatry and Counseling [address] [facility] [place]. [place], [place] [address] [place], [place], [place] [address]      [place] [address]  [place], [place] [address] [phone]  [place] [address] [place]  [place], [place] [address]       [phone]     [facility] (virtual only) [phone]  [facility] [address] [place], [place]  Family Solutions:  [place]. [place] [phone]  [facility]:  [place] [address] [place] [phone]  [facility] (under & uninsured) [address] [place] [phone]    [email or web address]    [place] [phone] [address]  [place]    [phone]  [facility]     Phone:  [phone]     [place]  [place] [address]      [place], [place] [address] [place]:  [place], [place], [place]   [place] ([place] therapist) [email or web address]  [place] [address]   [place] [address]    [phone]    [facility]   [address],  [place], [place]   [place]  [place]  [facility]  [address] [place], [place]        [phone]  Open Access/Walk In Clinic under & uninsured  [facility]  [address] [place],  [place] Crisis [phone]  [facility] [place],  ([place])   [place], [place]: [phone]) 8:30 - 12; 1 - 2:30  Family Service of the [place],  [place], [place]    ([phone]):8:30 - 12; 2 - 3PM  [facility] [employer],  [address],  [place]; [phone]):   Mon - Fri 8 AM - 5 PM  [facility] [place]  MWF 12:30 to 3:00 or call to schedule an appointment  [phone]  Specific Provider options Psychology Today  [email or web address] click on find a therapist  enter your zip code left side and select or tailor a therapist for your specific need.   [facility] Provider Directory [email or web address]  (Medicaid)   Follow all drop down to find a provider  [place] or [email or web address] [address], [place], [place] Recovery support and educational   24- Hour Availability:   [facility]  [address] [place], [place] Crisis [phone]  Family Service of the [employer] [phone]  [place]  [phone]   [place]  [phone] (after hours)  Therapeutic Alternative/Mobile Crisis   [phone]  [place] National Suicide Hotline  [phone] Diamantina Monks)  Call 911 or go to emergency room  [facility]  [phone]);  [place] and [employer]  [phone]); [place], [place], [place], [place], [place], [place], [place]

## 2021-04-04 NOTE — Assessment & Plan Note (Signed)
[facility] vaccine [facility] [address]  Also received Prevnar 20 today.

## 2021-04-04 NOTE — Assessment & Plan Note (Signed)
Repeat hemoglobin A1c today is 6.5.  Not requiring changes in management at this time.  Patient is due for ophthalmology exam and previously referred at last visit.

## 2021-04-05 LAB — LIPID PANEL
Chol/HDL Ratio: 3.1 ratio (ref 0.0–4.4)
Cholesterol, Total: 166 mg/dL (ref 100–199)
HDL: 54 mg/dL (ref >39)
LDL Chol Calc (NIH): 91 mg/dL (ref 0–99)
Triglycerides: 118 mg/dL (ref 0–149)
VLDL Cholesterol Cal: 21 mg/dL (ref 5–40)

## 2021-04-05 LAB — CBC
Hematocrit: 40.5 % (ref 34.0–46.6)
Hemoglobin: 13.7 g/dL (ref 11.1–15.9)
MCH: 30 pg (ref 26.6–33.0)
MCHC: 33.8 g/dL (ref 31.5–35.7)
MCV: 89 fL (ref 79–97)
Platelets: 206 10*3/uL (ref 150–450)
RBC: 4.56 x10E6/uL (ref 3.77–5.28)
RDW: 13.7 % (ref 11.7–15.4)
WBC: 5.9 10*3/uL (ref 3.4–10.8)

## 2021-04-05 LAB — TSH: TSH: 5.93 u[IU]/mL — ABNORMAL HIGH (ref 0.450–4.500)

## 2021-04-07 ENCOUNTER — Telehealth: Payer: Self-pay

## 2021-04-07 ENCOUNTER — Telehealth: Payer: Self-pay | Admitting: Student

## 2021-04-07 DIAGNOSIS — E890 Postprocedural hypothyroidism: Secondary | ICD-10-CM

## 2021-04-07 MED ORDER — LEVOTHYROXINE SODIUM 100 MCG PO TABS: 100.0000 ug | ORAL_TABLET | Freq: Every day | ORAL | 2 refills | Status: DC

## 2021-04-07 NOTE — Telephone Encounter (Signed)
Spoke with patient, results showed hypothyroid. Increase levothyroxine to 176mcg. Recheck TSH in 4-6 weeks. Pt notified that other lab results normal. She stated she was contacted for jury duty but is unable to fulfill duty due to difficulty walking. Will drop off form for PCP endorsement in the near future given this has been endorsed in the past.

## 2021-04-07 NOTE — Telephone Encounter (Signed)
Patient calls nurse line regarding results of TSH. Patient reports that she is needing refill on thyroid medication, however, was unsure if adjustments need to be made due to recent TSH levels.   Please advise.   Talbot Grumbling, RN

## 2021-04-10 ENCOUNTER — Telehealth: Payer: Self-pay | Admitting: Family Medicine

## 2021-04-10 ENCOUNTER — Other Ambulatory Visit: Payer: Self-pay

## 2021-04-10 DIAGNOSIS — E890 Postprocedural hypothyroidism: Secondary | ICD-10-CM

## 2021-04-10 MED ORDER — LEVOTHYROXINE SODIUM 100 MCG PO TABS: 100.0000 ug | ORAL_TABLET | Freq: Every day | ORAL | 2 refills | Status: DC

## 2021-04-10 NOTE — Telephone Encounter (Signed)
Patient dropped off Allison [name] duty form to be completed. Last DOS was 04/04/21. Placed in [place] folder

## 2021-04-10 NOTE — Telephone Encounter (Signed)
Clinical info completed on Jury Duty form.  Placed form in Dr. Serita Grit box for completion.  Ottis Stain, CMA

## 2021-04-11 ENCOUNTER — Other Ambulatory Visit: Payer: Self-pay | Admitting: Family Medicine
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
## 2021-04-13 NOTE — Telephone Encounter (Signed)
Letter printed and placed up front for pick up per patient preference. The patient has been made aware.

## 2021-04-20 DIAGNOSIS — N200 Calculus of kidney: Secondary | ICD-10-CM | POA: Diagnosis not present

## 2021-05-22 ENCOUNTER — Telehealth: Payer: Self-pay | Admitting: Family Medicine

## 2021-05-22 NOTE — Telephone Encounter (Signed)
Patient was called to schedule AWV. Please assist in scheduling if patient calls back.  Thanks!

## 2021-06-09 ENCOUNTER — Other Ambulatory Visit: Payer: Self-pay | Admitting: Family Medicine

## 2021-06-09 DIAGNOSIS — F411 Generalized anxiety disorder: Secondary | ICD-10-CM

## 2021-06-13 ENCOUNTER — Other Ambulatory Visit: Payer: Self-pay

## 2021-06-13 ENCOUNTER — Ambulatory Visit: Payer: Medicare Other

## 2021-06-13 DIAGNOSIS — E119 Type 2 diabetes mellitus without complications: Secondary | ICD-10-CM

## 2021-06-13 MED ORDER — METFORMIN HCL 1000 MG PO TABS: 1000.0000 mg | ORAL_TABLET | Freq: Two times a day (BID) | ORAL | 0 refills | Status: DC

## 2021-09-04 ENCOUNTER — Other Ambulatory Visit: Payer: Self-pay | Admitting: Family Medicine

## 2021-09-04 DIAGNOSIS — E119 Type 2 diabetes mellitus without complications: Secondary | ICD-10-CM

## 2021-09-04 DIAGNOSIS — F411 Generalized anxiety disorder: Secondary | ICD-10-CM

## 2021-09-05 ENCOUNTER — Encounter: Payer: Self-pay | Admitting: *Deleted

## 2021-10-19 DIAGNOSIS — N200 Calculus of kidney: Secondary | ICD-10-CM | POA: Diagnosis not present

## 2021-12-24 ENCOUNTER — Other Ambulatory Visit: Payer: Self-pay | Admitting: Family Medicine

## 2021-12-28 ENCOUNTER — Other Ambulatory Visit: Payer: Self-pay

## 2021-12-28 DIAGNOSIS — E890 Postprocedural hypothyroidism: Secondary | ICD-10-CM

## 2021-12-28 DIAGNOSIS — F411 Generalized anxiety disorder: Secondary | ICD-10-CM

## 2021-12-28 DIAGNOSIS — E119 Type 2 diabetes mellitus without complications: Secondary | ICD-10-CM

## 2021-12-28 MED ORDER — PRAVASTATIN SODIUM 40 MG PO TABS: 40.0000 mg | ORAL_TABLET | Freq: Every evening | ORAL | 0 refills | Status: DC

## 2021-12-28 MED ORDER — METFORMIN HCL 1000 MG PO TABS: 1000.0000 mg | ORAL_TABLET | Freq: Two times a day (BID) | ORAL | 0 refills | Status: DC

## 2021-12-28 MED ORDER — LEVOTHYROXINE SODIUM 100 MCG PO TABS: 100.0000 ug | ORAL_TABLET | Freq: Every day | ORAL | 0 refills | Status: DC

## 2021-12-28 MED ORDER — ALPRAZOLAM 0.5 MG PO TABS: 0.5000 mg | ORAL_TABLET | Freq: Every day | ORAL | 0 refills | Status: DC | PRN

## 2022-01-12 ENCOUNTER — Telehealth: Payer: Self-pay

## 2022-01-12 NOTE — Patient Outreach (Signed)
  Care Coordination   01/12/2022 Name: Allison [name] MRN: [id number] DOB: 21-Nov-1955   Care Coordination Outreach Attempts:  An unsuccessful telephone outreach was attempted today to offer the patient information about available care coordination services as a benefit of their health plan.   Follow Up Plan:  Additional outreach attempts will be made to offer the patient care coordination information and services.   Encounter Outcome:  No Answer  Care Coordination Interventions Activated:  No   Care Coordination Interventions:  No, not indicated    Jone Baseman, RN, MSN [facility] Care Management Care Management Coordinator Direct Line [phone]

## 2022-01-29 ENCOUNTER — Telehealth: Payer: Self-pay

## 2022-01-29 NOTE — Patient Outreach (Signed)
  Care Coordination   Initial Visit Note   01/29/2022 Name: Allison [name] MRN: [id number] DOB: 23-Sep-1955  Allison [name] is a 66 y.o. year old female who sees Allison Guiles, DO for primary care. I spoke with  Allison [name] by phone today.  What matters to the patients health and wellness today?  none    Goals Addressed             This Visit's Progress    COMPLETED: Care Coordination Activities- No follow up required       Care Coordination Interventions: Advised patient to schedule annual wellness exam and eye exam  Declined [facility] services and support          SDOH assessments and interventions completed:  Yes     Care Coordination Interventions Activated:  Yes  Care Coordination Interventions:  Yes, provided   Follow up plan: No further intervention required.   Encounter Outcome:  Pt. Visit Completed   Jone Baseman, RN, MSN [place] Management Care Management Coordinator Direct Line [phone]

## 2022-01-29 NOTE — Patient Instructions (Signed)
Visit Information  Thank you for taking time to visit with me today. Please don't hesitate to contact me if I can be of assistance to you.   Following are the goals we discussed today:   Goals Addressed             This Visit's Progress    COMPLETED: Care Coordination Activities- No follow up required       Care Coordination Interventions: Advised patient to schedule annual wellness exam and eye exam  Declined [place] [facility] services and support          If you are experiencing a Mental Health or [place] or need someone to talk to, please call the Suicide and Crisis Lifeline: 988   Patient verbalizes understanding of instructions and care plan provided today and agrees to view in [place]. Active MyChart status and patient understanding of how to access instructions and care plan via MyChart confirmed with patient.     No further follow up required: patient decline  Jone Baseman, RN, MSN [facility] Management Care Management Coordinator Direct Line [phone]

## 2022-01-30 NOTE — Progress Notes (Signed)
SUBJECTIVE:   CHIEF COMPLAINT / HPI:   Type 2 Diabetes: Home medications include: Metformin 1000 mg twice daily, glyburide 10 mg daily. Does endorse compliance.  Patient is not up to date on diabetic eye. Patient is not up to date on diabetic foot exam.   Abscess: Boil in the center of her chest came up Monday and has grown since then. Denies fevers/chills. Hasn't taken any medications other than Benadryl originally since it itched. Painful.   Dysuria: 1-2 weeks, increased frequency and burning when she urinates every 2-3 hours. Further, no fever/chills.   Healthcare maintenance: Due for colonoscopy, general lab work, mammogram, DEXA scan.  PERTINENT  PMH / PSH: HTN, T2DM, hypothyroidism, GERD, asthma  OBJECTIVE:  BP 134/68   Pulse 100   Ht '5\' 6"'$  (1.676 m)   Wt 228 lb (103.4 kg)   SpO2 99%   BMI 36.80 kg/m  General: Awake, alert, NAD CV: RRR, no murmurs auscultated Pulm: CTA B, normal WOB Skin: 3 x 3.5 cm erythematous, soft abscess without fluctuance or tracking in the center chest between breasts consistent with skin abscess (see photo) Abdominal: Suprapubic tenderness Back: No CVA tenderness    ASSESSMENT/PLAN:  Type II diabetes mellitus with complication ([place]) Assessment & Plan: needs improvement - Last A1c:  Lab Results  Component Value Date   HGBA1C 7.6 (A) 02/02/2022   - Medications: Metformin 1000 mg twice daily, glyburide 10 mg daily - Compliance: Yes I discussed treatment alternatives as I generally do not recommend glyburide in this patient population and she would receive better control with alternatives.  Would likely be better controlled with SGLT2 I or GLP-1 agonist.  She will discuss with her insurance company to see if these can be covered.  Orders: -     Basic metabolic panel -     Ambulatory referral to Ophthalmology -     Microalbumin / creatinine urine ratio -     POCT glycosylated hemoglobin (Hb A1C) -     metFORMIN HCl; Take 1 tablet (1,000  mg total) by mouth 2 (two) times daily.  Dispense: 180 tablet; Refill: 3 -     glyBURIDE; Take 2 tablets (10 mg total) by mouth daily with breakfast.  Dispense: 60 tablet; Refill: 0  Cutaneous abscess of chest wall Assessment & Plan: New, no signs of cellulitis.  Patient not interested in having it drained today.  Antibiotics x1 week with follow-up soon thereafter.  Orders: -     Cephalexin; Take 1 capsule (500 mg total) by mouth 2 (two) times daily for 7 days.  Dispense: 14 capsule; Refill: 0  Dysuria Assessment & Plan: UA not consistent with UTI and no evidence of glucosuria.  May improve regardless with Keflex for her skin abscess.  Orders: -     POCT urinalysis dipstick  Hyperlipidemia associated with type 2 diabetes mellitus ([place]) Assessment & Plan: Continue pravastatin, intolerant to simvastatin.  May adjust depending on lipid panel.  Orders: -     Basic metabolic panel -     Lipid panel -     Pravastatin Sodium; Take 1 tablet (40 mg total) by mouth every evening.  Dispense: 360 tablet; Refill: 0  Postoperative hypothyroidism -     TSH -     Levothyroxine Sodium; Take 1 tablet (100 mcg total) by mouth daily before breakfast for 360 doses.  Dispense: 90 tablet; Refill: 3  Asthma with acute exacerbation, unspecified asthma severity, unspecified whether persistent -     Albuterol Sulfate; Take  3 mLs (2.5 mg total) by nebulization every 6 (six) hours as needed for wheezing or shortness of breath.  Dispense: 75 mL; Refill: 1 -     Cetirizine HCl; Take 1 tablet (10 mg total) by mouth daily.  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  Uses Xanax minimally.  Behavioral health consultant has been discussed in the past per chart review.  Advised her this is not a good long-term option but I have represcribed as she has shown no signs of abusing medication and this is a stable treatment regimen for her.  Orders: -      ALPRAZolam; Take 1 tablet (0.5 mg total) by mouth daily as needed for anxiety.  Dispense: 30 tablet; Refill: 0  Encounter for screening mammogram for malignant neoplasm of breast -     Digital Screening Mammogram, Left and Right; Future  Need for immunization against influenza -     Flu Vaccine QUAD High Dose(Fluad)  Health care maintenance Assessment & Plan: She is adamantly against colonoscopy.  Unsure if DEXA scan would be covered by insurance.  We will need to further discuss colon cancer screening alternatives.  She will ask her insurance company if DEXA scan would be covered.   Return in about 1 week (around 02/09/2022) for abscess f/u. [name], DO 02/02/2022, 1:10 PM PGY-2, [place]

## 2022-02-02 ENCOUNTER — Encounter: Payer: Self-pay | Admitting: Student

## 2022-02-02 ENCOUNTER — Ambulatory Visit (INDEPENDENT_AMBULATORY_CARE_PROVIDER_SITE_OTHER): Payer: Medicare Other | Admitting: *Deleted

## 2022-02-02 ENCOUNTER — Ambulatory Visit (INDEPENDENT_AMBULATORY_CARE_PROVIDER_SITE_OTHER): Payer: Medicare Other | Admitting: Student

## 2022-02-02 VITALS — BP 134/68 | HR 100 | Ht 66.0 in | Wt 228.0 lb

## 2022-02-02 DIAGNOSIS — E1169 Type 2 diabetes mellitus with other specified complication: Secondary | ICD-10-CM | POA: Diagnosis not present

## 2022-02-02 DIAGNOSIS — R300 Dysuria: Secondary | ICD-10-CM | POA: Diagnosis not present

## 2022-02-02 DIAGNOSIS — E890 Postprocedural hypothyroidism: Secondary | ICD-10-CM

## 2022-02-02 DIAGNOSIS — E785 Hyperlipidemia, unspecified: Secondary | ICD-10-CM | POA: Diagnosis not present

## 2022-02-02 DIAGNOSIS — J45901 Unspecified asthma with (acute) exacerbation: Secondary | ICD-10-CM

## 2022-02-02 DIAGNOSIS — L02213 Cutaneous abscess of chest wall: Secondary | ICD-10-CM

## 2022-02-02 DIAGNOSIS — E118 Type 2 diabetes mellitus with unspecified complications: Secondary | ICD-10-CM | POA: Diagnosis not present

## 2022-02-02 DIAGNOSIS — Z23 Encounter for immunization: Secondary | ICD-10-CM | POA: Diagnosis not present

## 2022-02-02 DIAGNOSIS — Z1231 Encounter for screening mammogram for malignant neoplasm of breast: Secondary | ICD-10-CM

## 2022-02-02 DIAGNOSIS — F411 Generalized anxiety disorder: Secondary | ICD-10-CM

## 2022-02-02 DIAGNOSIS — I10 Essential (primary) hypertension: Secondary | ICD-10-CM

## 2022-02-02 DIAGNOSIS — Z0000 Encounter for general adult medical examination without abnormal findings: Secondary | ICD-10-CM

## 2022-02-02 DIAGNOSIS — L0291 Cutaneous abscess, unspecified: Secondary | ICD-10-CM | POA: Insufficient documentation

## 2022-02-02 LAB — POCT URINALYSIS DIP (MANUAL ENTRY)
Bilirubin, UA: NEGATIVE
Glucose, UA: NEGATIVE mg/dL
Nitrite, UA: NEGATIVE
Spec Grav, UA: 1.015 (ref 1.010–1.025)
Urobilinogen, UA: 0.2 E.U./dL
pH, UA: 5.5 (ref 5.0–8.0)

## 2022-02-02 LAB — POCT GLYCOSYLATED HEMOGLOBIN (HGB A1C): HbA1c, POC (controlled diabetic range): 7.6 % — AB (ref 0.0–7.0)

## 2022-02-02 MED ORDER — ALBUTEROL SULFATE (2.5 MG/3ML) 0.083% IN NEBU: 2.5000 mg | INHALATION_SOLUTION | Freq: Four times a day (QID) | RESPIRATORY_TRACT | 1 refills | Status: DC | PRN

## 2022-02-02 MED ORDER — PRAVASTATIN SODIUM 40 MG PO TABS: 40.0000 mg | ORAL_TABLET | Freq: Every evening | ORAL | 0 refills | Status: DC

## 2022-02-02 MED ORDER — METFORMIN HCL 1000 MG PO TABS: 1000.0000 mg | ORAL_TABLET | Freq: Two times a day (BID) | ORAL | 3 refills | Status: DC

## 2022-02-02 MED ORDER — GLYBURIDE 5 MG PO TABS: 10.0000 mg | ORAL_TABLET | Freq: Every day | ORAL | 0 refills | Status: DC

## 2022-02-02 MED ORDER — LEVOTHYROXINE SODIUM 100 MCG PO TABS: 100.0000 ug | ORAL_TABLET | Freq: Every day | ORAL | 3 refills | Status: DC

## 2022-02-02 MED ORDER — ALPRAZOLAM 0.5 MG PO TABS: 0.5000 mg | ORAL_TABLET | Freq: Every day | ORAL | 0 refills | Status: DC | PRN

## 2022-02-02 MED ORDER — CETIRIZINE HCL 10 MG PO TABS: 10.0000 mg | ORAL_TABLET | Freq: Every day | ORAL | 3 refills | Status: AC

## 2022-02-02 MED ORDER — CEPHALEXIN 500 MG PO CAPS: 500.0000 mg | ORAL_CAPSULE | Freq: Two times a day (BID) | ORAL | 0 refills | Status: DC

## 2022-02-02 NOTE — Assessment & Plan Note (Signed)
Continue pravastatin, intolerant to simvastatin.  May adjust depending on lipid panel.

## 2022-02-02 NOTE — Assessment & Plan Note (Addendum)
needs improvement - Last A1c:  Lab Results  Component Value Date   HGBA1C 7.6 (A) 02/02/2022   - Medications: Metformin 1000 mg twice daily, glyburide 10 mg daily - Compliance: Yes I discussed treatment alternatives as I generally do not recommend glyburide in this patient population and she would receive better control with alternatives.  Would likely be better controlled with SGLT2 I or GLP-1 agonist.  She will discuss with her insurance company to see if these can be covered.

## 2022-02-02 NOTE — Assessment & Plan Note (Signed)
She is adamantly against colonoscopy.  Unsure if DEXA scan would be covered by insurance.  We will need to further discuss colon cancer screening alternatives.  She will ask her insurance company if DEXA scan would be covered.

## 2022-02-02 NOTE — Assessment & Plan Note (Addendum)
UA not consistent with UTI and no evidence of glucosuria.  May improve regardless with Keflex for her skin abscess.

## 2022-02-02 NOTE — Assessment & Plan Note (Signed)
New, no signs of cellulitis.  Patient not interested in having it drained today.  Antibiotics x1 week with follow-up soon thereafter.

## 2022-02-02 NOTE — Assessment & Plan Note (Signed)
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  Uses Xanax minimally.  Behavioral health consultant has been discussed in the past per chart review.  Advised her this is not a good long-term option but I have represcribed as she has shown no signs of abusing medication and this is a stable treatment regimen for her.

## 2022-02-02 NOTE — Patient Instructions (Addendum)
It was great to see you today! Thank you for choosing [facility] for your primary care. Allison [name] was seen for diabetes, skin abscess, dysuria.  Today we addressed: Diabetes: Your A1c is 7.6.  I have refilled your metformin and a 1 month supply of glyburide.  Please ask your insurance company if they would cover medication such as SGLT2 inhibitors (Farxiga, Jardiance) or GLP-1 agonist (Trulicity, Ozempic) or alternatives of these medications. Skin abscess: Keflex 500 mg twice daily x7 days. Dysuria: I will get back to you regarding which antibiotic would I am placing you on depending on what the urine comes back as. Thyroid: We are checking your thyroid labs. Health maintenance: I have placed referrals for mammogram.  He received a couple vaccines today.  Please check with your insurance to make sure they will cover a DEXA scan which is for osteoporosis screening.  We will discuss colon cancer screening at an upcoming visit.  If you haven't already, sign up for My Chart to have easy access to your labs results, and communication with your primary care physician.  We are checking some labs today. If they are abnormal, I will call you. If they are normal, I will send you a MyChart message (if it is active) or a letter in the mail. If you do not hear about your labs in the next 2 weeks, please call the office. I recommend that you always bring your medications to each appointment as this makes it easy to ensure you are on the correct medications and helps [place] not miss refills when you need them. Call the clinic at [phone] if your symptoms worsen or you have any concerns.  You should return to our clinic Return in about 1 week (around 02/09/2022) for abscess f/u. Please arrive 15 minutes before your appointment to ensure smooth check in process.  We appreciate your efforts in making this happen.  Thank you for allowing me to participate in your care, [name], DO 02/02/2022,  9:29 AM PGY-2, [place]

## 2022-02-03 LAB — MICROALBUMIN / CREATININE URINE RATIO
Creatinine, Urine: 123.1 mg/dL
Microalb/Creat Ratio: 37 mg/g creat — ABNORMAL HIGH (ref 0–29)
Microalbumin, Urine: 45.6 ug/mL

## 2022-02-03 LAB — BASIC METABOLIC PANEL
BUN/Creatinine Ratio: 14 (ref 12–28)
BUN: 18 mg/dL (ref 8–27)
CO2: 24 mmol/L (ref 20–29)
Calcium: 9 mg/dL (ref 8.7–10.3)
Chloride: 98 mmol/L (ref 96–106)
Creatinine, Ser: 1.26 mg/dL — ABNORMAL HIGH (ref 0.57–1.00)
Glucose: 169 mg/dL — ABNORMAL HIGH (ref 70–99)
Potassium: 4.3 mmol/L (ref 3.5–5.2)
Sodium: 142 mmol/L (ref 134–144)
eGFR: 47 mL/min/{1.73_m2} — ABNORMAL LOW (ref >59)

## 2022-02-03 LAB — LIPID PANEL
Chol/HDL Ratio: 3.4 ratio (ref 0.0–4.4)
Cholesterol, Total: 169 mg/dL (ref 100–199)
HDL: 50 mg/dL (ref >39)
LDL Chol Calc (NIH): 90 mg/dL (ref 0–99)
Triglycerides: 169 mg/dL — ABNORMAL HIGH (ref 0–149)
VLDL Cholesterol Cal: 29 mg/dL (ref 5–40)

## 2022-02-03 LAB — TSH: TSH: 1.17 u[IU]/mL (ref 0.450–4.500)

## 2022-02-09 ENCOUNTER — Ambulatory Visit (INDEPENDENT_AMBULATORY_CARE_PROVIDER_SITE_OTHER): Payer: Medicare Other | Admitting: Student

## 2022-02-09 ENCOUNTER — Other Ambulatory Visit: Payer: Self-pay | Admitting: Student

## 2022-02-09 ENCOUNTER — Encounter: Payer: Self-pay | Admitting: Student

## 2022-02-09 VITALS — BP 136/81 | HR 96 | Ht 66.0 in | Wt 229.2 lb

## 2022-02-09 DIAGNOSIS — E1129 Type 2 diabetes mellitus with other diabetic kidney complication: Secondary | ICD-10-CM | POA: Diagnosis not present

## 2022-02-09 DIAGNOSIS — Z0000 Encounter for general adult medical examination without abnormal findings: Secondary | ICD-10-CM

## 2022-02-09 DIAGNOSIS — E785 Hyperlipidemia, unspecified: Secondary | ICD-10-CM

## 2022-02-09 DIAGNOSIS — L02213 Cutaneous abscess of chest wall: Secondary | ICD-10-CM

## 2022-02-09 DIAGNOSIS — I10 Essential (primary) hypertension: Secondary | ICD-10-CM

## 2022-02-09 DIAGNOSIS — E118 Type 2 diabetes mellitus with unspecified complications: Secondary | ICD-10-CM

## 2022-02-09 DIAGNOSIS — R809 Proteinuria, unspecified: Secondary | ICD-10-CM

## 2022-02-09 DIAGNOSIS — E1169 Type 2 diabetes mellitus with other specified complication: Secondary | ICD-10-CM | POA: Diagnosis not present

## 2022-02-09 DIAGNOSIS — J45901 Unspecified asthma with (acute) exacerbation: Secondary | ICD-10-CM

## 2022-02-09 MED ORDER — BLOOD GLUCOSE MONITOR KIT: PACK | 0 refills | Status: DC

## 2022-02-09 MED ORDER — ROSUVASTATIN CALCIUM 20 MG PO TABS: 20.0000 mg | ORAL_TABLET | Freq: Every day | ORAL | 3 refills | Status: DC

## 2022-02-09 MED ORDER — ALBUTEROL SULFATE HFA 108 (90 BASE) MCG/ACT IN AERS: 2.0000 | INHALATION_SPRAY | RESPIRATORY_TRACT | 2 refills | Status: DC | PRN

## 2022-02-09 MED ORDER — DOXYCYCLINE HYCLATE 100 MG PO TABS: 100.0000 mg | ORAL_TABLET | Freq: Two times a day (BID) | ORAL | 0 refills | Status: DC

## 2022-02-09 MED ORDER — LOSARTAN POTASSIUM 25 MG PO TABS: 25.0000 mg | ORAL_TABLET | Freq: Every day | ORAL | 3 refills | Status: DC

## 2022-02-09 NOTE — Assessment & Plan Note (Signed)
Still no signs of cellulitis.  Recent fluctuance gives me concern for MRSA.  Recommend warm compresses with doxycycline 100 mg twice daily x10 days.  Follow-up in 1 week, if not improved at that time may require I&D.

## 2022-02-09 NOTE — Assessment & Plan Note (Signed)
DEXA will be fully covered by her insurance, however patient wants to wait until beginning of the new year.  Pursue DEXA at that time in addition to FIT for colon cancer screening.

## 2022-02-09 NOTE — Patient Instructions (Signed)
It was great to see you today! Thank you for choosing [facility] for your primary care. Allison [name] was seen for an abscess, HLD, T2DM.  Today we addressed: Skin abscess: Doxycycline twice daily x10 days.  I would also advise warm compresses several times a day.  Follow-up in 1 week so that we can decide whether or not we need to open this up. Diabetes: I am going to reach out to our staff to see if we can help you get your medications covered a bit better.  I have prescribed supplies for you. Hyperlipidemia: Stop pravastatin.  Start Crestor daily. Hypertension: Because you have proteinuria and decently elevated blood pressure, it would be best to start you on losartan at a low dose.  We will need to check blood work at your next visit as this may increase your potassium levels. Healthcare maintenance: We will continue discussion about the DEXA scan and colon cancer screening.  If you haven't already, sign up for My Chart to have easy access to your labs results, and communication with your primary care physician.  We are checking some labs today. If they are abnormal, I will call you. If they are normal, I will send you a MyChart message (if it is active) or a letter in the mail. If you do not hear about your labs in the next 2 weeks, please call the office. I recommend that you always bring your medications to each appointment as this makes it easy to ensure you are on the correct medications and helps [place] not miss refills when you need them. Call the clinic at [phone] if your symptoms worsen or you have any concerns.  You should return to our clinic Return in about 1 week (around 02/16/2022) for Skin abscess. Please arrive 15 minutes before your appointment to ensure smooth check in process.  We appreciate your efforts in making this happen.  Thank you for allowing me to participate in your care, [name], DO 02/09/2022, 11:26 AM PGY-2, [place]

## 2022-02-09 NOTE — Progress Notes (Signed)
  SUBJECTIVE:   CHIEF COMPLAINT / HPI:   Abscess: Presented for this on 11/3 and prescribed Keflex.  Finished last dose of Keflex today.  Notes there has been significant drainage in the last few days, more than she expected.  Still tenderness in the area but without systemic symptoms.  Hyperlipidemia: Home medications include pravastatin 40 mg daily. Endorses compliance. Last lipid panel: LDL of 90.  Type 2 Diabetes: Home medications include: Metformin 1000 mg twice daily, glyburide 10 mg daily. Does endorse compliance.  Most recent A1c 7.6.  Patient was to discuss GLP-1 and SGLT2 medications with insurance company. Insurance said $50 for each of those medications.  Patient is not up to date on diabetic eye.  Ophthalmology referral previously placed. Patient is not up to date on diabetic foot exam.   Healthcare maintenance: Patient was to discuss DEXA scan with insurance company.  Adamantly against colonoscopy. They cover the DEXA scan.  PERTINENT  PMH / PSH: T2DM, HTN, hypothyroidism, GERD, asthma  OBJECTIVE:  BP 136/81   Pulse 96   Ht _0  (1.676 m)   Wt 229 lb 4 oz (104 kg)   SpO2 99%   BMI 37.00 kg/m  General: Awake, alert, NAD Derm: 3 x 3.5 cm erythematous, indurated, tender abscess with granulation tissue but without fluctuance in the center of her chest between breasts   ASSESSMENT/PLAN:  Cutaneous abscess of chest wall Assessment & Plan: Still no signs of cellulitis.  Recent fluctuance gives me concern for MRSA.  Recommend warm compresses with doxycycline 100 mg twice daily x10 days.  Follow-up in 1 week, if not improved at that time may require I&D.  Orders: -     Doxycycline Hyclate; Take 1 tablet (100 mg total) by mouth 2 (two) times daily.  Dispense: 20 tablet; Refill: 0  Microalbuminuria due to type 2 diabetes mellitus ([place]) Assessment & Plan: Start losartan 25 mg daily.  Recheck BMP in 1 week.   Hyperlipidemia associated with type 2 diabetes mellitus  ([place]) Assessment & Plan: LDL 90, goal <70.  Discontinue pravastatin, start Crestor 20 mg daily  Orders: -     Rosuvastatin Calcium; Take 1 tablet (20 mg total) by mouth daily.  Dispense: 90 tablet; Refill: 3  Type II diabetes mellitus with complication [facility]) Assessment & Plan: Message sent to clinic pharmacy tech for consideration of medication assistance as I feel that she would benefit from GLP-1 or SGLT2 inhibitor instead of her sulfonylurea.  Orders: -     blood glucose meter kit and supplies; Dispense based on patient and insurance preference. Use up to four times daily as directed.  Dispense: 1 each; Refill: 0  HYPERTENSION, BENIGN SYSTEMIC -     Losartan Potassium; Take 1 tablet (25 mg total) by mouth at bedtime.  Dispense: 90 tablet; Refill: 3 -     Basic metabolic panel; Future  Asthma with acute exacerbation, unspecified asthma severity, unspecified whether persistent -     Albuterol Sulfate HFA; Inhale 2-4 puffs into the lungs every 4 (four) hours as needed for wheezing (or cough).  Dispense: 2 each; Refill: 2  Health care maintenance Assessment & Plan: DEXA will be fully covered by her insurance, however patient wants to wait until beginning of the new year.  Pursue DEXA at that time in addition to FIT for colon cancer screening.   Return in about 1 week (around 02/16/2022) for Skin abscess. [name], DO 02/09/2022, 12:46 PM PGY-2, [place]

## 2022-02-09 NOTE — Assessment & Plan Note (Signed)
Message sent to clinic pharmacy tech for consideration of medication assistance as I feel that she would benefit from GLP-1 or SGLT2 inhibitor instead of her sulfonylurea.

## 2022-02-09 NOTE — Assessment & Plan Note (Signed)
LDL 90, goal <70.  Discontinue pravastatin, start Crestor 20 mg daily

## 2022-02-09 NOTE — Assessment & Plan Note (Addendum)
Start losartan 25 mg daily.  Recheck BMP in 1 week.

## 2022-02-12 ENCOUNTER — Other Ambulatory Visit (HOSPITAL_COMMUNITY): Payer: Self-pay

## 2022-02-12 MED ORDER — ACCU-CHEK GUIDE ME W/DEVICE KIT: PACK | 0 refills | Status: DC

## 2022-02-12 NOTE — Addendum Note (Signed)
Addended by: Talbot Grumbling on: 02/12/2022 02:11 PM   Modules accepted: Orders

## 2022-02-16 ENCOUNTER — Encounter: Payer: Self-pay | Admitting: Student

## 2022-02-16 ENCOUNTER — Ambulatory Visit (INDEPENDENT_AMBULATORY_CARE_PROVIDER_SITE_OTHER): Payer: Medicare Other | Admitting: Student

## 2022-02-16 VITALS — BP 146/82 | HR 95 | Ht 66.0 in | Wt 227.0 lb

## 2022-02-16 DIAGNOSIS — E785 Hyperlipidemia, unspecified: Secondary | ICD-10-CM

## 2022-02-16 DIAGNOSIS — E1169 Type 2 diabetes mellitus with other specified complication: Secondary | ICD-10-CM

## 2022-02-16 DIAGNOSIS — L02213 Cutaneous abscess of chest wall: Secondary | ICD-10-CM | POA: Diagnosis not present

## 2022-02-16 DIAGNOSIS — R809 Proteinuria, unspecified: Secondary | ICD-10-CM

## 2022-02-16 DIAGNOSIS — E1129 Type 2 diabetes mellitus with other diabetic kidney complication: Secondary | ICD-10-CM

## 2022-02-16 MED ORDER — ROSUVASTATIN CALCIUM 5 MG PO TABS: 5.0000 mg | ORAL_TABLET | Freq: Every day | ORAL | 3 refills | Status: DC

## 2022-02-16 MED ORDER — MUPIROCIN 2 % EX OINT: 1.0000 | TOPICAL_OINTMENT | Freq: Two times a day (BID) | CUTANEOUS | 0 refills | Status: DC

## 2022-02-16 NOTE — Assessment & Plan Note (Signed)
Side effect of increased urination with losartan.  This is in combination with her recent diarrhea related to abscess.  Although this is not a known side effect, will prefer to have patient get over current illness and will readdress microalbuminuria at a later date.  Stop losartan for now.

## 2022-02-16 NOTE — Patient Instructions (Signed)
It was great to see you today! Thank you for choosing [facility] for your primary care. Allison [name] was seen for abscess.  Today we addressed: Abscess: Please complete your doxycycline.  Drink plenty of fluids to make up for your fluid loss with the diarrhea.  I would continue to take this medication with food and drink.  I am also prescribing mupirocin ointment to apply topically. Stop taking your losartan for now.  We will readdress this at another time.  I am decreasing your Crestor dosage to the lowest possible dose and we will slowly go up from there. Diabetes: I will reach out to our pharmacy team to start the process for getting financial assistance for your diabetes medications that we are hoping to start ([place]).  If you haven't already, sign up for My Chart to have easy access to your labs results, and communication with your primary care physician.  I recommend that you always bring your medications to each appointment as this makes it easy to ensure you are on the correct medications and helps [place] not miss refills when you need them. Call the clinic at [phone] if your symptoms worsen or you have any concerns.  You should return to our clinic Return in about 2 weeks (around 03/02/2022) for Follow-up. Please arrive 15 minutes before your appointment to ensure smooth check in process.  We appreciate your efforts in making this happen.  Thank you for allowing me to participate in your care, [name], DO 02/16/2022, 9:06 AM PGY-2, [place]

## 2022-02-16 NOTE — Assessment & Plan Note (Signed)
Did not tolerate Crestor 20 mg daily, this is in the setting of diarrhea related to current illness and dehydration is likely not helped tolerating this medication dosage.  Reduce to Crestor 5 mg daily.  Attempt to increase up to 10 mg next month.

## 2022-02-16 NOTE — Assessment & Plan Note (Signed)
Improving on doxycycline.  Continue current treatment in addition to topical mupirocin.

## 2022-02-16 NOTE — Progress Notes (Signed)
  SUBJECTIVE:   CHIEF COMPLAINT / HPI:   Abscess: Follow-up, has completed entire round of Keflex and is on day 8 of doxycycline as Keflex did not fully resolve the abscess.  She has been having diarrhea in reaction to the antibiotics.  She has continued to apply warm compresses.  It has not drained any further.  Medication adherence: Also notes that she got muscle cramping since starting the Crestor.  She also noticed an increase in urination overnight when she took her losartan which led her to stop taking it in the past couple days.  PERTINENT  PMH / PSH: T2DM, HTN, hypothyroidism, GERD, asthma, HLD  OBJECTIVE:  BP (!) 146/82   Pulse 95   Ht '5\' 6"'$  (1.676 m)   Wt 227 lb (103 kg)   SpO2 97%   BMI 36.64 kg/m  General: Awake, alert, NAD, fatigued appearing CV: RRR, no murmurs auscultated Pulm: CTA B, normal WOB Derm: 3 x 3.5 cm erythematous, nontender, nonindurated abscess without fluctuance in the center chest between breasts   ASSESSMENT/PLAN:  Cutaneous abscess of chest wall Assessment & Plan: Improving on doxycycline.  Continue current treatment in addition to topical mupirocin.  Orders: -     Mupirocin; Apply 1 Application topically 2 (two) times daily.  Dispense: 22 g; Refill: 0  Hyperlipidemia associated with type 2 diabetes mellitus ([place]) Assessment & Plan: Did not tolerate Crestor 20 mg daily, this is in the setting of diarrhea related to current illness and dehydration is likely not helped tolerating this medication dosage.  Reduce to Crestor 5 mg daily.  Attempt to increase up to 10 mg next month.  Orders: -     Rosuvastatin Calcium; Take 1 tablet (5 mg total) by mouth daily.  Dispense: 90 tablet; Refill: 3  Microalbuminuria due to type 2 diabetes mellitus ([place]) Assessment & Plan: Side effect of increased urination with losartan.  This is in combination with her recent diarrhea related to abscess.  Although this is not a known side effect, will prefer to have  patient get over current illness and will readdress microalbuminuria at a later date.  Stop losartan for now.   Return in about 2 weeks (around 03/02/2022) for Follow-up. [name], DO 02/16/2022, 10:08 AM PGY-2, [place]

## 2022-02-20 ENCOUNTER — Encounter: Payer: Self-pay | Admitting: Student

## 2022-02-27 ENCOUNTER — Encounter: Payer: Self-pay | Admitting: Student

## 2022-02-27 ENCOUNTER — Ambulatory Visit (INDEPENDENT_AMBULATORY_CARE_PROVIDER_SITE_OTHER): Payer: Medicare Other | Admitting: Student

## 2022-02-27 VITALS — BP 142/78 | HR 84

## 2022-02-27 DIAGNOSIS — L02213 Cutaneous abscess of chest wall: Secondary | ICD-10-CM

## 2022-02-27 DIAGNOSIS — R300 Dysuria: Secondary | ICD-10-CM

## 2022-02-27 LAB — POCT URINALYSIS DIP (MANUAL ENTRY)
Bilirubin, UA: NEGATIVE
Blood, UA: NEGATIVE
Glucose, UA: NEGATIVE mg/dL
Ketones, POC UA: NEGATIVE mg/dL
Leukocytes, UA: NEGATIVE
Nitrite, UA: NEGATIVE
Protein Ur, POC: NEGATIVE mg/dL
Spec Grav, UA: 1.01 (ref 1.010–1.025)
Urobilinogen, UA: 0.2 E.U./dL
pH, UA: 5.5 (ref 5.0–8.0)

## 2022-02-27 NOTE — Assessment & Plan Note (Signed)
Incomplete resolved with full treatment course of Keflex and doxycycline.  Not infectious appearing.  Incision and drainage Discussed risks/benefits of procedure, and patient gave consent for incision and drainage.  Prepped skin in usual sterile fashion.  Used 4cc of 1% lidocaine for local anesthesia, 3 mm punch tool.  Irrigated area with high pressure saline.  Drainage consistent of approximately 10 cc of mixed blood and serous material. Patient tolerated procedure well.  Pressure dressing with triple ointment applied afterwards.  Discussed wound care.  Apply mupirocin after 24 hours.  Follow-up as needed.

## 2022-02-27 NOTE — Progress Notes (Signed)
  SUBJECTIVE:   CHIEF COMPLAINT / HPI:   Abscess: Follow-up, has completed course of Keflex and then course of doxycycline.  Has continued to apply warm compresses and mupirocin topically.  Denies fluctuance.  Dysuria: accompanied with back pain bilaterally, increased urination x 5 days. She is urinating 6-7x/day. Denies fever/chills.   PERTINENT  PMH / PSH: T2DM, HTN, hypothyroidism, GERD, asthma, HLD  OBJECTIVE:  BP (!) 142/78   Pulse 84   SpO2 100%  General: awake, alert, comfortable appearing Skin: 3 x 3 cm erythematous, tender, nonindurated abscess without fluctuance in the center chest Back: CVA tenderness    ASSESSMENT/PLAN:  Cutaneous abscess of chest wall Assessment & Plan: Incomplete resolved with full treatment course of Keflex and doxycycline.  Not infectious appearing.  Incision and drainage Discussed risks/benefits of procedure, and patient gave consent for incision and drainage.  Prepped skin in usual sterile fashion.  Used 4cc of 1% lidocaine for local anesthesia, 3 mm punch tool.  Irrigated area with high pressure saline.  Drainage consistent of approximately 10 cc of mixed blood and serous material. Patient tolerated procedure well.  Pressure dressing with triple ointment applied afterwards.  Discussed wound care.  Apply mupirocin after 24 hours.  Follow-up as needed.     Orders: -     WOUND CULTURE  Dysuria Assessment & Plan: UA clean with no evidence of infection or glucosuria.  Reassurance provided.  Orders: -     POCT urinalysis dipstick  Return in about 1 month (around 03/29/2022) for Diabetes, cholesterol. [name], DO 02/27/2022, 1:12 PM PGY-2, [place]

## 2022-02-27 NOTE — Patient Instructions (Addendum)
It was great to see you today! Thank you for choosing [facility] for your primary care. Allison [name] was seen for abscess and increased urination.  Today we addressed: Abscess: We incised and drained this.  I would recommend using Tylenol as needed over the next 24 hours if you have any further pain.  Please let this continue to drain and do not change the dressing unless you need to in the next 24 hours.  And you may apply mupirocin over top of it 1-2 times daily after that.  I cultured this wound and will follow-up with you regarding the results for this. Your urinalysis is negative.  You do not have a UTI or any signs of kidney infection. I would recommend returning in a couple weeks for further discussion of your medical conditions (diabetes, cholesterol).  Please keep an eye out for the financial assistance forms for the diabetic medications.  If you haven't already, sign up for My Chart to have easy access to your labs results, and communication with your primary care physician.  We are checking some labs today. If they are abnormal, I will call you. If they are normal, I will send you a MyChart message (if it is active) or a letter in the mail. If you do not hear about your labs in the next 2 weeks, please call the office. I recommend that you always bring your medications to each appointment as this makes it easy to ensure you are on the correct medications and helps [place] not miss refills when you need them. Call the clinic at [phone] if your symptoms worsen or you have any concerns.  You should return to our clinic Return in about 1 month (around 03/29/2022) for Diabetes, cholesterol. Please arrive 15 minutes before your appointment to ensure smooth check in process.  We appreciate your efforts in making this happen.  Thank you for allowing me to participate in your care, [name], DO 02/27/2022, 12:27 PM PGY-2, [place]

## 2022-02-27 NOTE — Assessment & Plan Note (Signed)
UA clean with no evidence of infection or glucosuria.  Reassurance provided.

## 2022-02-28 ENCOUNTER — Telehealth: Payer: Self-pay

## 2022-02-28 NOTE — Telephone Encounter (Signed)
Submitted application for OZEMPIC to [place] for patient assistance.   Phone: [phone]   Mailing [place] application to pt's home for [place]

## 2022-03-02 LAB — SPECIMEN STATUS REPORT

## 2022-03-02 LAB — WOUND CULTURE: Organism ID, Bacteria: NONE SEEN

## 2022-04-11 NOTE — Progress Notes (Signed)
  SUBJECTIVE:   CHIEF COMPLAINT / HPI:   Type 2 Diabetes: Home medications include: Metformin 1000 mg twice daily, glyburide 10 mg daily. Does endorse compliance. Home glucose monitoring {home testing:315145}.  Last A1c 7.6 on 02/02/2022  Patient is not up to date on diabetic eye. Patient is not up to date on diabetic foot exam.   Hyperlipidemia: Home medications include Crestor 5 mg daily.  Goal is to increase up to 10 mg daily once her cutaneous abscess resolved. Endorses compliance. Last lipid panel: With LDL 90.  PERTINENT  PMH / PSH: T2DM, HTN, hypothyroidism, GERD, asthma, HLD  OBJECTIVE:  There were no vitals taken for this visit. Physical Exam   ASSESSMENT/PLAN:  There are no diagnoses linked to this encounter. No follow-ups on file. [name], DO 04/11/2022, 5:31 PM PGY-***, [facility] {    This will disappear when note is signed, click to select method of visit    :1}

## 2022-04-12 ENCOUNTER — Ambulatory Visit (INDEPENDENT_AMBULATORY_CARE_PROVIDER_SITE_OTHER): Payer: Medicare Other | Admitting: Student

## 2022-04-12 ENCOUNTER — Encounter: Payer: Self-pay | Admitting: Student

## 2022-04-12 VITALS — BP 136/77 | HR 88 | Wt 229.0 lb

## 2022-04-12 DIAGNOSIS — J329 Chronic sinusitis, unspecified: Secondary | ICD-10-CM | POA: Diagnosis not present

## 2022-04-12 DIAGNOSIS — E785 Hyperlipidemia, unspecified: Secondary | ICD-10-CM

## 2022-04-12 DIAGNOSIS — E1169 Type 2 diabetes mellitus with other specified complication: Secondary | ICD-10-CM

## 2022-04-12 DIAGNOSIS — F411 Generalized anxiety disorder: Secondary | ICD-10-CM

## 2022-04-12 DIAGNOSIS — I10 Essential (primary) hypertension: Secondary | ICD-10-CM

## 2022-04-12 DIAGNOSIS — J45901 Unspecified asthma with (acute) exacerbation: Secondary | ICD-10-CM

## 2022-04-12 DIAGNOSIS — E118 Type 2 diabetes mellitus with unspecified complications: Secondary | ICD-10-CM | POA: Diagnosis not present

## 2022-04-12 MED ORDER — ALBUTEROL SULFATE HFA 108 (90 BASE) MCG/ACT IN AERS: 2.0000 | INHALATION_SPRAY | RESPIRATORY_TRACT | 2 refills | Status: DC | PRN

## 2022-04-12 MED ORDER — GLIPIZIDE 5 MG PO TABS: 10.0000 mg | ORAL_TABLET | Freq: Every day | ORAL | 3 refills | Status: DC

## 2022-04-12 MED ORDER — AMOXICILLIN-POT CLAVULANATE 500-125 MG PO TABS: 500.0000 mg | ORAL_TABLET | Freq: Two times a day (BID) | ORAL | 0 refills | Status: AC

## 2022-04-12 MED ORDER — FLUTICASONE PROPIONATE 50 MCG/ACT NA SUSP: 1.0000 | Freq: Every day | NASAL | 0 refills | Status: DC

## 2022-04-12 MED ORDER — ALPRAZOLAM 0.5 MG PO TABS: 0.5000 mg | ORAL_TABLET | Freq: Every day | ORAL | 0 refills | Status: DC | PRN

## 2022-04-12 NOTE — Patient Instructions (Signed)
It was great to see you today! Thank you for choosing [facility] for your primary care. Allison [name] was seen for sinusitis and follow-up of general medical conditions.  Today we addressed: Sinusitis: I recommend using Flonase 1 spray in each nostril daily and I have prescribed Augmentin to take for 1 week.  I have also refilled your albuterol inhaler.  You may use over-the-counter Delsym for cough suppressant purposes. For your diabetes, I have discontinued glyburide and prescribed you glipizide 10 mg to take daily.  We will check your A1c in 1 month. For your Crestor, please take a teaspoon of yellow mustard which may help with muscle cramps.  If this does not help, we can consider bempedoic acid as an alternate medication.  If you haven't already, sign up for My Chart to have easy access to your labs results, and communication with your primary care physician.  I recommend that you always bring your medications to each appointment as this makes it easy to ensure you are on the correct medications and helps [place] not miss refills when you need them. Call the clinic at [phone] if your symptoms worsen or you have any concerns.  You should return to our clinic Return in about 1 month (around 05/13/2022) for Diabetes hyperlipidemia follow-up. Please arrive 15 minutes before your appointment to ensure smooth check in process.  We appreciate your efforts in making this happen.  Thank you for allowing me to participate in your care, [name], DO 04/12/2022, 9:34 AM PGY-2, [place]

## 2022-04-12 NOTE — Assessment & Plan Note (Signed)
LDL 90 on last check.  Continue Crestor 5 mg daily, recommended yellow mustard to help with muscle cramps.  Consider transitioning medical therapy to bempedoic acid at next visit if she is still plagued by these muscle cramps.

## 2022-04-12 NOTE — Assessment & Plan Note (Signed)
Secondary to previous viral illness.  Intranasal corticosteroid and antibiotic for treatment.

## 2022-04-12 NOTE — Assessment & Plan Note (Signed)
She is amenable to restarting losartan 25 mg daily at this time.  Goal <130/80.

## 2022-04-12 NOTE — Assessment & Plan Note (Signed)
Continue metformin 1000 mg twice daily, transition glyburide to glipizide 10 mg daily given lessened side effect profile.  Still pending [place] and Ozempic information.  Recheck A1c at next visit.

## 2022-05-02 ENCOUNTER — Other Ambulatory Visit (HOSPITAL_COMMUNITY): Payer: Self-pay

## 2022-05-17 NOTE — Telephone Encounter (Signed)
Resubmitted novo nordisk application online via portal

## 2022-05-17 NOTE — Telephone Encounter (Signed)
Received notification from [place] regarding approval for [facility]. Patient assistance approved from 05/16/22 to 04/02/23.  MEDICATION WILL SHIP TO PT'S HOME  Phone: [phone]

## 2022-05-23 NOTE — Telephone Encounter (Signed)
Received notification from [employer] regarding approval for OZEMPIC. Patient assistance approved from 05/22/22 to 04/02/23.  MEDICATION WILL SHIP TO OFFICE  Phone: [phone]  

## 2022-06-04 NOTE — Progress Notes (Deleted)
  SUBJECTIVE:   CHIEF COMPLAINT / HPI:   Type 2 Diabetes: Home medications include: Metformin '1000mg'$  BID, glipizide '10mg'$  daily. She was approved for [place] and Ozempic. ***. {Blank single:19197::"Does","Does not"} endorse compliance.   Most recent A1Cs:  Lab Results  Component Value Date   HGBA1C 7.6 (A) 02/02/2022   HGBA1C 6.5 04/04/2021   Last Microalbumin, LDL, Creatinine: Lab Results  Component Value Date   LDLCALC 90 02/02/2022   CREATININE 1.26 (H) 02/02/2022    Patient is not up to date on diabetic eye. Patient is not up to date on diabetic foot exam.   Hyperlipidemia: Home medications include Crestor '5mg'$ . Endorses compliance. Last visit she listed concerns of muscle cramps. Last lipid panel LDL 90.  PERTINENT  PMH / PSH: T2DM, HTN, hypothyroidism, GERD, asthma, HLD  OBJECTIVE:  There were no vitals taken for this visit. Physical Exam   ASSESSMENT/PLAN:  There are no diagnoses linked to this encounter. No follow-ups on file. [name], DO 06/04/2022, 1:40 PM PGY-***, [facility] {    This will disappear when note is signed, click to select method of visit    :1}

## 2022-06-05 ENCOUNTER — Telehealth: Payer: Self-pay

## 2022-06-05 NOTE — Telephone Encounter (Signed)
Left message informing patient her novo nordisk shipment is ready for pickup  2 ozempic 0.'25mg'$ /0.'5mg'$  dose pens are labeled and ready in med room fridge.

## 2022-06-06 ENCOUNTER — Ambulatory Visit: Payer: Medicare Other | Admitting: Student

## 2022-06-06 DIAGNOSIS — E118 Type 2 diabetes mellitus with unspecified complications: Secondary | ICD-10-CM

## 2022-06-12 NOTE — Progress Notes (Unsigned)
  SUBJECTIVE:   CHIEF COMPLAINT / HPI:   Type 2 Diabetes: Home medications include: metformin 1000mg  BID, glipizide 10mg  daily. Farxiga 5mg  (not yet started), Ozempic (Approved and in meds stocking clinic). Home glucose monitoring is performed regularly. Fasting 130-140.   Patient is not up to date on diabetic eye, referral pending Patient is not up to date on diabetic foot exam.   Hyperlipidemia: Home medications include Crestor 5mg  daily. Her muscle cramps with this medication have not gotten better. She has stopped taking it. Endorses compliance. Last lipid panel:  Lab Results  Component Value Date   CHOL 169 02/02/2022   HDL 50 02/02/2022   LDLCALC 90 02/02/2022   LDLDIRECT 90 10/09/2013   TRIG 169 (H) 02/02/2022   CHOLHDL 3.4 02/02/2022   Hypertension: BP: (!) 159/80 today. Home medications include: Losartan 25 mg daily. She does not endorse taking these medications as prescribed because it causes frequent urination for her.  Urine has been checked in the past for this same concern.  Patient has had a BMP in the past 1 year.  PERTINENT  PMH / PSH: T2DM, HTN, hypothyroidism, GERD, asthma, HLD  Patient Care Team: [name], DO as PCP - General (Family Medicine) OBJECTIVE:  BP (!) 159/80   Pulse 99   SpO2 100%  General: Well-appearing, NAD Psych: Normal mood and affect  ASSESSMENT/PLAN:  Type II diabetes mellitus with complication [facility]) Assessment & Plan: well controlled - Last A1c:  Lab Results  Component Value Date   HGBA1C 6.5 06/13/2022  - Medications: Metformin 1000 mg twice daily, discontinue glipizide, start Farxiga 5 mg daily and Ozempic 0.25 mg weekly.  Dr. Valentina Lucks discussed Ozempic and walk-through first dose during clinic visit.  Return in 1 month to discuss how well she is tolerating new medications.  Orders: -     POCT glycosylated hemoglobin (Hb A1C) -     Semaglutide(0.25 or 0.5MG /DOS); Inject 0.25 mg into the skin once a week. Increase to 0.5mg  after 4  doses. Then 1mg  after 4 doses of that.  Dispense: 3 mL; Refill: 3  HYPERTENSION, BENIGN SYSTEMIC Assessment & Plan: BP: (!) 159/80 today. Poorly controlled. Continue to work on healthy dietary habits and exercise. Medication regimen: Discontinue losartan, initiate olmesartan 20 mg daily.  Will reassess further changes at next visit.  Orders: -     Olmesartan Medoxomil; Take 1 tablet (20 mg total) by mouth at bedtime.  Dispense: 90 tablet; Refill: 0  Hyperlipidemia associated with type 2 diabetes mellitus ([place]) Assessment & Plan: Unfortunately did not tolerate Crestor.  She is tolerating her pravastatin 40 mg daily.  Orders: -     Pravastatin Sodium; Take 1 tablet (40 mg total) by mouth at bedtime.  Dispense: 90 tablet; Refill: 3  Return in about 1 month (around 07/14/2022) for diabetes follow up. [name], DO 06/13/2022, 2:12 PM PGY-2, [place]

## 2022-06-13 ENCOUNTER — Ambulatory Visit (INDEPENDENT_AMBULATORY_CARE_PROVIDER_SITE_OTHER): Payer: Medicare Other | Admitting: Student

## 2022-06-13 VITALS — BP 159/80 | HR 99

## 2022-06-13 DIAGNOSIS — E118 Type 2 diabetes mellitus with unspecified complications: Secondary | ICD-10-CM

## 2022-06-13 DIAGNOSIS — I10 Essential (primary) hypertension: Secondary | ICD-10-CM | POA: Diagnosis not present

## 2022-06-13 DIAGNOSIS — E785 Hyperlipidemia, unspecified: Secondary | ICD-10-CM | POA: Diagnosis not present

## 2022-06-13 DIAGNOSIS — E1169 Type 2 diabetes mellitus with other specified complication: Secondary | ICD-10-CM

## 2022-06-13 LAB — POCT GLYCOSYLATED HEMOGLOBIN (HGB A1C): HbA1c, POC (controlled diabetic range): 6.5 % (ref 0.0–7.0)

## 2022-06-13 MED ORDER — SEMAGLUTIDE(0.25 OR 0.5MG/DOS) 2 MG/3ML ~~LOC~~ SOPN: 0.2500 mg | PEN_INJECTOR | SUBCUTANEOUS | 3 refills | Status: DC

## 2022-06-13 MED ORDER — PRAVASTATIN SODIUM 40 MG PO TABS: 40.0000 mg | ORAL_TABLET | Freq: Every day | ORAL | 3 refills | Status: DC

## 2022-06-13 MED ORDER — OLMESARTAN MEDOXOMIL 20 MG PO TABS: 20.0000 mg | ORAL_TABLET | Freq: Every day | ORAL | 0 refills | Status: DC

## 2022-06-13 NOTE — Assessment & Plan Note (Signed)
Unfortunately did not tolerate Crestor.  She is tolerating her pravastatin 40 mg daily.

## 2022-06-13 NOTE — Assessment & Plan Note (Signed)
BP: (!) 159/80 today. Poorly controlled. Continue to work on healthy dietary habits and exercise. Medication regimen: Discontinue losartan, initiate olmesartan 20 mg daily.  Will reassess further changes at next visit.

## 2022-06-13 NOTE — Patient Instructions (Addendum)
It was great to see you today! Thank you for choosing [facility] for your primary care. Allison [name] was seen for follow-up.  Today we addressed: Diabetes: Please start your Farxiga and Ozempic.  You may stop glipizide.  Your fasting glucose will go up briefly but has you increase the medication of Ozempic and allow it to work, this should come back down. Cholesterol: Stop Crestor.  Start back with your pravastatin 40 mg daily. High blood pressure: Stop losartan. Let's try a different medication called Olmesartan.  If you haven't already, sign up for My Chart to have easy access to your labs results, and communication with your primary care physician.  I recommend that you always bring your medications to each appointment as this makes it easy to ensure you are on the correct medications and helps [place] not miss refills when you need them. Call the clinic at [phone] if your symptoms worsen or you have any concerns.  You should return to our clinic Return in about 1 month (around 07/14/2022) for diabetes follow up. Please arrive 15 minutes before your appointment to ensure smooth check in process.  We appreciate your efforts in making this happen.  Thank you for allowing me to participate in your care, [name], DO 06/13/2022, 11:47 AM PGY-2, [place]

## 2022-06-13 NOTE — Assessment & Plan Note (Signed)
well controlled - Last A1c:  Lab Results  Component Value Date   HGBA1C 6.5 06/13/2022  - Medications: Metformin 1000 mg twice daily, discontinue glipizide, start Farxiga 5 mg daily and Ozempic 0.25 mg weekly.  Dr. Valentina Lucks discussed Ozempic and walk-through first dose during clinic visit.  Return in 1 month to discuss how well she is tolerating new medications.

## 2022-07-03 ENCOUNTER — Other Ambulatory Visit: Payer: Self-pay | Admitting: Student

## 2022-07-03 DIAGNOSIS — F411 Generalized anxiety disorder: Secondary | ICD-10-CM

## 2022-07-12 ENCOUNTER — Telehealth: Payer: Self-pay | Admitting: *Deleted

## 2022-07-12 NOTE — Progress Notes (Signed)
  Care Coordination  Outreach Note  07/12/2022 Name: AHMINA ZOLL MRN: [id number] DOB: May 20, 1955   Care Coordination Outreach Attempts: An unsuccessful telephone outreach was attempted today to offer the patient information about available care coordination services as a benefit of their health plan.   Follow Up Plan:  Additional outreach attempts will be made to offer the patient care coordination information and services.   Encounter Outcome:  No Answer  Christie Nottingham  Care Coordination Care Guide  Direct Dial: [phone]

## 2022-07-15 NOTE — Progress Notes (Unsigned)
SUBJECTIVE:   CHIEF COMPLAINT / HPI:   Type 2 Diabetes: Home medications include: Metformin 1000mg  BID, Farxiga 5mg  daily, Ozempic 0.5mg  weekly. Does endorse compliance. Home glucose monitoring is performed regularly. Concerned she has a yeast infection which started about 1 week after starting Farxiga. OTC medication has not helped.   Most recent A1Cs:  Lab Results  Component Value Date   HGBA1C 6.5 06/13/2022   HGBA1C 7.6 (A) 02/02/2022   Last Microalbumin, LDL, Creatinine: Lab Results  Component Value Date   LDLCALC 90 02/02/2022   CREATININE 1.26 (H) 02/02/2022   Patient is not up to date on diabetic eye. Patient is not up to date on diabetic foot exam.   Hyperlipidemia: Home medications include pravastatin 40mg  daily. Endorses compliance.   PERTINENT  PMH / PSH: T2DM, HTN, hypothyroidism, GERD, asthma, HLD  Patient Care Team: [name], DO as PCP - General (Family Medicine) OBJECTIVE:  BP 124/60   Pulse 94   Ht 5\' 6"  (1.676 m)   Wt 211 lb 3.2 oz (95.8 kg)   SpO2 97%   BMI 34.09 kg/m  Note: Well-appearing, NAD CV: RRR, no murmurs auscultated Pulm: Normal WOB  ASSESSMENT/PLAN:  Type II diabetes mellitus with complication Assessment & Plan: well controlled - Last A1c:  Lab Results  Component Value Date   HGBA1C 6.5 06/13/2022   - Medications: Metformin 1000 mg twice daily, Ozempic 0.5 mg weekly (increased to 1 mg weekly, supply is provided by [employer] and I have sent message to pharmacy tech for increasing prescription dosage), Farxiga 5 mg daily.  Advised to hold Farxiga given dysuria causing yeast infection.  Restart in 1 week after Diflucan. -Follow-up in 2 months for A1c -Placed new ophthalmology referral as she wants to go to [facility] where her husband is seen  Orders: -     Semaglutide(0.25 or 0.5MG /DOS); Inject 0.25 mg into the skin once a week. Increase to 0.5mg  after 4 doses. Then 1mg  after 4 doses of that.  Dispense: 3 mL; Refill:  3 -     Glucose Blood; Use as instructed  Dispense: 100 each; Refill: 12 -     POCT UA - Microscopic Only -     Ambulatory referral to Ophthalmology  Dysuria Assessment & Plan: UA clean with the exception of glucosuria secondary to [place].  Likely culprit of yeast infection.  Hold [place] while being treated.  Should she develop this again with restarting Marcelline Deist, she may just not be a [place] candidate.  Orders: -     POCT urinalysis dipstick -     Fluconazole; Take 1 tablet today, and another in 48 hours.  Dispense: 2 tablet; Refill: 0 -     POCT UA - Microscopic Only  HYPERTENSION, BENIGN SYSTEMIC Assessment & Plan: BP: 124/60 today. Well controlled. Goal of <130/80. Continue to work on healthy dietary habits and exercise. Medication regimen: Olmesartan 20 mg daily  Orders: -     Olmesartan Medoxomil; Take 1 tablet (20 mg total) by mouth at bedtime.  Dispense: 90 tablet; Refill: 2  Hyperlipidemia associated with type 2 diabetes mellitus Assessment & Plan: Tolerating pravastatin, recheck lipid panel.  Orders: -     Lipid panel  Asthma with acute exacerbation, unspecified asthma severity, unspecified whether persistent -     Albuterol Sulfate HFA; Inhale 2-4 puffs into the lungs every 4 (four) hours as needed for wheezing (or cough).  Dispense: 2 each; Refill: 2  Return in about 2 months (around 09/15/2022) for  Diabetes. [name], DO 07/16/2022, 10:28 AM PGY-2, [facility]

## 2022-07-16 ENCOUNTER — Ambulatory Visit (INDEPENDENT_AMBULATORY_CARE_PROVIDER_SITE_OTHER): Payer: Medicare Other | Admitting: Student

## 2022-07-16 ENCOUNTER — Encounter: Payer: Self-pay | Admitting: Student

## 2022-07-16 VITALS — BP 124/60 | HR 94 | Ht 66.0 in | Wt 211.2 lb

## 2022-07-16 DIAGNOSIS — I10 Essential (primary) hypertension: Secondary | ICD-10-CM | POA: Diagnosis not present

## 2022-07-16 DIAGNOSIS — E118 Type 2 diabetes mellitus with unspecified complications: Secondary | ICD-10-CM | POA: Diagnosis not present

## 2022-07-16 DIAGNOSIS — J45901 Unspecified asthma with (acute) exacerbation: Secondary | ICD-10-CM

## 2022-07-16 DIAGNOSIS — E1169 Type 2 diabetes mellitus with other specified complication: Secondary | ICD-10-CM

## 2022-07-16 DIAGNOSIS — E785 Hyperlipidemia, unspecified: Secondary | ICD-10-CM | POA: Diagnosis not present

## 2022-07-16 DIAGNOSIS — R300 Dysuria: Secondary | ICD-10-CM | POA: Diagnosis not present

## 2022-07-16 LAB — POCT URINALYSIS DIP (MANUAL ENTRY)
Bilirubin, UA: NEGATIVE
Blood, UA: NEGATIVE
Glucose, UA: >=1000 mg/dL — AB
Ketones, POC UA: NEGATIVE mg/dL
Nitrite, UA: NEGATIVE
Protein Ur, POC: NEGATIVE mg/dL
Spec Grav, UA: 1.015 (ref 1.010–1.025)
Urobilinogen, UA: 0.2 E.U./dL
pH, UA: 5.5 (ref 5.0–8.0)

## 2022-07-16 LAB — POCT UA - MICROSCOPIC ONLY

## 2022-07-16 MED ORDER — FLUCONAZOLE 150 MG PO TABS
ORAL_TABLET | ORAL | 0 refills | Status: DC
Start: 2022-07-16 — End: 2022-09-10

## 2022-07-16 MED ORDER — ALBUTEROL SULFATE HFA 108 (90 BASE) MCG/ACT IN AERS
2.0000 | INHALATION_SPRAY | RESPIRATORY_TRACT | 2 refills | Status: DC | PRN
Start: 2022-07-16 — End: 2023-09-10

## 2022-07-16 MED ORDER — OLMESARTAN MEDOXOMIL 20 MG PO TABS
20.0000 mg | ORAL_TABLET | Freq: Every day | ORAL | 2 refills | Status: DC
Start: 2022-07-16 — End: 2022-09-10

## 2022-07-16 MED ORDER — GLUCOSE BLOOD VI STRP
ORAL_STRIP | 12 refills | Status: DC
Start: 2022-07-16 — End: 2022-09-10

## 2022-07-16 MED ORDER — SEMAGLUTIDE(0.25 OR 0.5MG/DOS) 2 MG/3ML ~~LOC~~ SOPN
0.2500 mg | PEN_INJECTOR | SUBCUTANEOUS | 3 refills | Status: DC
Start: 2022-07-16 — End: 2022-12-17

## 2022-07-16 NOTE — Assessment & Plan Note (Signed)
well controlled - Last A1c:  Lab Results  Component Value Date   HGBA1C 6.5 06/13/2022   - Medications: Metformin 1000 mg twice daily, Ozempic 0.5 mg weekly (increased to 1 mg weekly, supply is provided by [employer] and I have sent message to pharmacy tech for increasing prescription dosage), Farxiga 5 mg daily.  Advised to hold Farxiga given dysuria causing yeast infection.  Restart in 1 week after Diflucan. -Follow-up in 2 months for A1c -Placed new ophthalmology referral as she wants to go to [facility] where her husband is seen

## 2022-07-16 NOTE — Assessment & Plan Note (Signed)
Tolerating pravastatin, recheck lipid panel.

## 2022-07-16 NOTE — Patient Instructions (Signed)
It was great to see you today! Thank you for choosing [facility] for your primary care. Allison [name] was seen for diabetes.  Today we addressed: Diabetes: The test trips can vary between 20-30 points from minutes minute regarding the glucose value.  I have sent in more test trips for you.  Please hold taking the Farxiga for the next few days while you get the yeast infection under control.  He may restart in 1 week after the yeast infection is under control.  I have sent a message to our pharmacy tech regarding your Ozempic supply.  I will also send a message to our referral coordinator about your ophthalmology appointment. Hypertension: Your blood pressure looks great.  I am also checking your cholesterol level today.  If you haven't already, sign up for My Chart to have easy access to your labs results, and communication with your primary care physician.  We are checking some labs today. If they are abnormal, I will call you. If they are normal, I will send you a MyChart message (if it is active) or a letter in the mail. If you do not hear about your labs in the next 2 weeks, please call the office. I recommend that you always bring your medications to each appointment as this makes it easy to ensure you are on the correct medications and helps [place] not miss refills when you need them. Call the clinic at [phone] if your symptoms worsen or you have any concerns.  You should return to our clinic Return in about 2 months (around 09/15/2022) for Diabetes. Please arrive 15 minutes before your appointment to ensure smooth check in process.  We appreciate your efforts in making this happen.  Thank you for allowing me to participate in your care, Allison Mattocks, DO 07/16/2022, 9:29 AM PGY-2, [facility]

## 2022-07-16 NOTE — Assessment & Plan Note (Signed)
BP: 124/60 today. Well controlled. Goal of <130/80. Continue to work on healthy dietary habits and exercise. Medication regimen: Olmesartan 20 mg daily

## 2022-07-16 NOTE — Assessment & Plan Note (Signed)
UA clean with the exception of glucosuria secondary to [place].  Likely culprit of yeast infection.  Hold [place] while being treated.  Should she develop this again with restarting Marcelline Deist, she may just not be a [place] candidate.

## 2022-07-16 NOTE — Progress Notes (Unsigned)
  Care Coordination  Outreach Note  07/16/2022 Name: Allison [name] MRN: [id number] DOB: [birth year]   Care Coordination Outreach Attempts: A second unsuccessful outreach was attempted today to offer the patient with information about available care coordination services as a benefit of their health plan.     Follow Up Plan:  Additional outreach attempts will be made to offer the patient care coordination information and services.   Encounter Outcome:  No Answer  Christie Nottingham  Care Coordination Care Guide  Direct Dial: [phone]

## 2022-07-17 LAB — LIPID PANEL
Chol/HDL Ratio: 3.4 ratio (ref 0.0–4.4)
Cholesterol, Total: 175 mg/dL (ref 100–199)
HDL: 51 mg/dL (ref >39)
LDL Chol Calc (NIH): 91 mg/dL (ref 0–99)
Triglycerides: 197 mg/dL — ABNORMAL HIGH (ref 0–149)
VLDL Cholesterol Cal: 33 mg/dL (ref 5–40)

## 2022-07-18 NOTE — Progress Notes (Signed)
  Care Coordination   Note   07/18/2022 Name: Allison [name] MRN: [id number] DOB: Oct 09, 1955  Allison [name] is a 67 y.o. year old female who sees [name], DO for primary care. I reached out to [name] by phone today to offer care coordination services.  [name] was given information about Care Coordination services today including:   The Care Coordination services include support from the care team which includes your Nurse Coordinator, Clinical Social Worker, or Pharmacist.  The Care Coordination team is here to help remove barriers to the health concerns and goals most important to you. Care Coordination services are voluntary, and the patient may decline or stop services at any time by request to their care team member.   Care Coordination Consent Status: Patient agreed to services and verbal consent obtained.   Follow up plan:  Telephone appointment with care coordination team member scheduled for:  07/27/22  Encounter Outcome:  Pt. Scheduled  [facility] Coordination Care Guide  Direct Dial: [phone]

## 2022-07-25 DIAGNOSIS — K080 Exfoliation of teeth due to systemic causes: Secondary | ICD-10-CM | POA: Diagnosis not present

## 2022-07-27 ENCOUNTER — Telehealth: Payer: Self-pay

## 2022-07-27 ENCOUNTER — Ambulatory Visit: Payer: Self-pay

## 2022-07-27 NOTE — Telephone Encounter (Signed)
-----   Message from Juanell Fairly, [place] sent at 07/27/2022 11:13 AM EDT ----- Regarding: [employer] Assistance program sent paper to the patient saying that the doctor forgot to fill out his name and information and the patient did not have her name and information on there either they are going to send the paperwork back but she is not sure where it is going  but she does have a number to call [phone] open M- FR 8 to 8  Thanks  Traci

## 2022-07-27 NOTE — Telephone Encounter (Signed)
Reached out to patient.   Letter rec'd from [employer] regarding ozempic refills having an incomplete application. Informed patient I will re-fax once I get back in office.

## 2022-07-27 NOTE — Patient Outreach (Unsigned)
  Care Coordination   Follow Up Visit Note   07/28/2022 Name: BABY GIEGER MRN: [id number] DOB: 01/15/1956  BRENISHA TSUI is a 67 y.o. year old female who sees [name], DO for primary care. I spoke with  [name] by phone today.  What matters to the patients health and wellness today?  I spoke with Mrs. Lelon Perla, and she has managed her health issues with her physicians. I sent a message to the pharmacy for help with her Ozempic per patient request.    Goals Addressed             This Visit's Progress    COMPLETED: Care Coordination Activites- no follow required        Care Coordination Interventions: Active listening / Reflection utilized  Emotional Support Provided Problem Solving /Task Center strategies reviewed Discussed/.Educated Care Coordination Program 2.   Discussed/.Educated Annual Wellness Visit 3.   Discussed/.Educated Social Determinates of Health 4.   Please inform PCP if services needed in the future         SDOH assessments and interventions completed:  Darnelle Catalan the help of her physicians   SDOH Interventions Today    Flowsheet Row Most Recent Value  SDOH Interventions   Food Insecurity Interventions Intervention Not Indicated  Transportation Interventions Intervention Not Indicated        Care Coordination Interventions:  Yes, provided   Interventions Today    Flowsheet Row Most Recent Value  General Interventions   General Interventions Discussed/Reviewed General Interventions Discussed  [Care Coordination Program discussed]        Follow up plan: No further intervention required.   Encounter Outcome:  Pt. Visit Completed   Juanell Fairly RN, BSN, [facility] Care Coordinator [facility]   Phone: [phone]

## 2022-07-28 NOTE — Patient Instructions (Signed)
Visit Information  Thank you for taking time to visit with me today. Please don't hesitate to contact me if I can be of assistance to you.   Following are the goals we discussed today:   Goals Addressed             This Visit's Progress    COMPLETED: Care Coordination Activites-no follow required        Care Coordination Interventions: Active listening / Reflection utilized  Emotional Support Provided Problem Solving /Task Center strategies reviewed Discussed/.Educated Care Coordination Program 2.   Discussed/.Educated Annual Wellness Visit 3.   Discussed/.Educated Social Determinates of Health 4.   Please inform PCP if services needed in the future          If you are experiencing a Mental Health or Behavioral Health Crisis or need someone to talk to, please call 1-800-273-TALK (toll free, 24 hour hotline)  Patient verbalizes understanding of instructions and care plan provided today and agrees to view in MyChart. Active MyChart status and patient understanding of how to access instructions and care plan via MyChart confirmed with patient.     Jaice Lague RN, BSN, [facility] Care Coordinator [facility]   Phone: [phone]         

## 2022-07-31 NOTE — Telephone Encounter (Signed)
Corrected missing PCP info on refill form and refaxed.

## 2022-08-06 NOTE — Telephone Encounter (Signed)
Rec'd fax from [employer] regarding refill approval.  Medication will ship to office.

## 2022-08-21 NOTE — Telephone Encounter (Signed)
Informed patient that [employer] shipment is ready for pickup  3 boxes of ozempic 1mg  dose pens are labeled and ready

## 2022-08-22 NOTE — Telephone Encounter (Signed)
Medication given to patient

## 2022-09-09 NOTE — Progress Notes (Unsigned)
  SUBJECTIVE:   CHIEF COMPLAINT / HPI:   Type 2 Diabetes: Home medications include: metformin 100mg  BID, Ozempic 1mg  weekly, Farxiga 5mg  daily. {Blank single:19197::"Does","Does not"} endorse compliance.   Most recent A1Cs:  Lab Results  Component Value Date   HGBA1C 6.5 09/10/2022   HGBA1C 6.5 06/13/2022   Last Microalbumin, LDL, Creatinine: Lab Results  Component Value Date   LDLCALC 91 07/16/2022   CREATININE 1.26 (H) 02/02/2022   Patient {rwisisnot:24883} up to date on diabetic eye. Referral placed prior.  Patient {rwisisnot:24883} up to date on diabetic foot exam.   Healthcare maintenance: Mammogram, Colonoscopy, Shingrix, Diabetic eye exam  PERTINENT  PMH / PSH: T2DM, HTN, hypothyroidism, GERD, asthma, HLD   Patient Care Team: [name], DO as PCP - General (Family Medicine) Juanell Fairly, RN as [facility] Care Management OBJECTIVE:  There were no vitals taken for this visit. Physical Exam   ASSESSMENT/PLAN:  Type II diabetes mellitus with complication (HCC) -     POCT glycosylated hemoglobin (Hb A1C) -     Accu-Chek Guide Me; USE UP TO 4 TIMES DAILY AS DIRECTED  Dispense: 1 kit; Refill: 0 -     Glucose Blood; Use as instructed  Dispense: 100 each; Refill: 12  HYPERTENSION, BENIGN SYSTEMIC -     Olmesartan Medoxomil; Take 1 tablet (20 mg total) by mouth at bedtime.  Dispense: 90 tablet; Refill: 2   No follow-ups on file. [name], DO 09/10/2022, 1:45 PM PGY-2,  Family Medicine {    This will disappear when note is signed, click to select method of visit    :1}

## 2022-09-10 ENCOUNTER — Ambulatory Visit (INDEPENDENT_AMBULATORY_CARE_PROVIDER_SITE_OTHER): Payer: Medicare Other | Admitting: Student

## 2022-09-10 VITALS — BP 141/65 | HR 100 | Wt 206.2 lb

## 2022-09-10 DIAGNOSIS — E118 Type 2 diabetes mellitus with unspecified complications: Secondary | ICD-10-CM | POA: Diagnosis not present

## 2022-09-10 DIAGNOSIS — I10 Essential (primary) hypertension: Secondary | ICD-10-CM | POA: Diagnosis not present

## 2022-09-10 DIAGNOSIS — F411 Generalized anxiety disorder: Secondary | ICD-10-CM | POA: Diagnosis not present

## 2022-09-10 LAB — POCT GLYCOSYLATED HEMOGLOBIN (HGB A1C): HbA1c, POC (controlled diabetic range): 6.5 % (ref 0.0–7.0)

## 2022-09-10 MED ORDER — ALPRAZOLAM 0.5 MG PO TABS
0.5000 mg | ORAL_TABLET | Freq: Every day | ORAL | 0 refills | Status: DC | PRN
Start: 2022-09-10 — End: 2022-11-12

## 2022-09-10 MED ORDER — GLUCOSE BLOOD VI STRP
ORAL_STRIP | 12 refills | Status: AC
Start: 2022-09-10 — End: ?

## 2022-09-10 MED ORDER — ACCU-CHEK GUIDE ME W/DEVICE KIT
PACK | 0 refills | Status: AC
Start: 2022-09-10 — End: ?

## 2022-09-10 MED ORDER — OLMESARTAN MEDOXOMIL 20 MG PO TABS: 20.0000 mg | ORAL_TABLET | Freq: Every day | ORAL | 2 refills | Status: DC

## 2022-09-10 NOTE — Assessment & Plan Note (Addendum)
Taking semaglutide 1 mg, metformin 1000 mg bid, and farxiga 5 mg. A1c is 6.5 today. DM2 is well controlled on current regimen. Refills sent. Follow up in 3 months.  Foot exam concerning for peripheral neuropathy.  Advised that she see podiatry for this, referral placed.

## 2022-09-10 NOTE — Assessment & Plan Note (Signed)
Taking Xanax 0.5 mg as needed. Patient is taking as prescribed per PDMP and requests refills. Refills sent. Follow up as needed.

## 2022-09-10 NOTE — Assessment & Plan Note (Addendum)
Taking olmesartan 20 mg. BP today is 141/65. She reports that she has felt dizzy a few times while sitting down and BP on her home wrist cuff is 100/54. Unclear if these measurements are accurate. Recommended getting a arm BP cuff for more accuracy. BP well controlled on current regimen. Refills sent. Follow up in 3 months.

## 2022-09-10 NOTE — Patient Instructions (Addendum)
It was great to see you today! Thank you for choosing [facility] for your primary care. Allison [name] was seen for diabetes.  Today we addressed: Your A1c looks great.  I have sent in some refills for you.  I have placed a referral to podiatry for you so they can take a look at any peripheral neuropathy you may have developed from having diabetes. Please get a arm blood pressure cuff for you to check your blood pressures if you ever feel like they are low.  Your blood pressure today is appropriate. Please consider colonoscopy, mammogram in the future in addition to a bone scan.  Please call [facility] at the below information for your ophthalmology appointment  [facility] Address: [address] [place], [place] [address] Phone: [phone]  If you haven't already, sign up for My Chart to have easy access to your labs results, and communication with your primary care physician.  I recommend that you always bring your medications to each appointment as this makes it easy to ensure you are on the correct medications and helps [place] not miss refills when you need them.  You should return to our clinic Return in about 3 months (around 12/11/2022) for Diabetes. Please arrive 15 minutes before your appointment to ensure smooth check in process.  We appreciate your efforts in making this happen.  Thank you for allowing me to participate in your care, [name], DO 09/10/2022, 2:06 PM PGY-2, [facility]

## 2022-09-10 NOTE — Progress Notes (Signed)
SUBJECTIVE:   CHIEF COMPLAINT / HPI:   Type 2 Diabetes: Home medications include: metformin 100mg  BID, Ozempic 1mg  weekly, Farxiga 5mg  daily. Does endorse compliance. Reports no adverse effects to medications. At home blood sugars: 135 in mornings, 160-170 after lunch is highest.  HTN Taking omesartan 20 mg. Reports some dizziness and feeling faint 3 times in last 2 weeks when sitting down. Checks bp on home wrist cuff when this happens and it has been 100/54. Does not feel faint when standing and can not identify any triggers for symptoms. BP today is 141/65.  Most recent A1Cs:  Lab Results  Component Value Date   HGBA1C 6.5 09/10/2022   HGBA1C 6.5 06/13/2022   Last Microalbumin, LDL, Creatinine: Lab Results  Component Value Date   LDLCALC 91 07/16/2022   CREATININE 1.26 (H) 02/02/2022   Patient is not up to date on diabetic eye. Referral placed prior.  Patient is up to date on diabetic foot exam.   Healthcare maintenance: Mammogram, Colonoscopy, Shingrix, Diabetic eye exam  PERTINENT  PMH / PSH: T2DM, HTN, hypothyroidism, GERD, asthma, HLD   Patient Care Team: [name], DO as PCP - General (Family Medicine) Juanell Fairly, RN as [facility] Care Management OBJECTIVE:  BP (!) 141/65   Pulse 100   Wt 206 lb 3.2 oz (93.5 kg)   SpO2 100%   BMI 33.28 kg/m  Gen: alert, well appearing, in no acute distress HEENT: normocephalic, atraumatic CV: RRR, no MRG Pulm: normal WOB, clear to auscultation bilaterally Ab: normoactive bowel sounds, no tenderness or masses Ext: no lower extremity edema Diabetic foot exam: no wounds or ulcers, several areas of diminished sensation bilaterally with monofilament test  ASSESSMENT/PLAN:  Type II diabetes mellitus with complication (HCC) Assessment & Plan: Taking semaglutide 1 mg, metformin 1000 mg bid, and farxiga 5 mg. A1c is 6.5 today. DM2 is well controlled on current regimen. Refills sent. Follow up in 3 months.  Foot  exam concerning for peripheral neuropathy.  Advised that she see podiatry for this, referral placed.  Orders: -     POCT glycosylated hemoglobin (Hb A1C) -     Accu-Chek Guide Me; USE UP TO 4 TIMES DAILY AS DIRECTED  Dispense: 1 kit; Refill: 0 -     Glucose Blood; Use as instructed  Dispense: 100 each; Refill: 12 -     Ambulatory referral to Podiatry  HYPERTENSION, BENIGN SYSTEMIC Assessment & Plan: Taking olmesartan 20 mg. BP today is 141/65. She reports that she has felt dizzy a few times while sitting down and BP on her home wrist cuff is 100/54. Unclear if these measurements are accurate. Recommended getting a arm BP cuff for more accuracy. BP well controlled on current regimen. Refills sent. Follow up in 3 months.  Orders: -     Olmesartan Medoxomil; Take 1 tablet (20 mg total) by mouth at bedtime.  Dispense: 90 tablet; Refill: 2  Anxiety state Assessment & Plan: Taking Xanax 0.5 mg as needed. Patient is taking as prescribed per PDMP and requests refills. Refills sent. Follow up as needed.  Orders: -     ALPRAZolam; Take 1 tablet (0.5 mg total) by mouth daily as needed for anxiety.  Dispense: 30 tablet; Refill: 0  Health Maintenance -Given information for eye doctor and instructed to make appointment -Referral sent for podiatry -Suggested mammogram and colonoscopy, patient declined at this time  Return in about 3 months (around 12/11/2022) for Diabetes. Barrett Shell, Medical Student 09/10/2022, 3:59 PM  I was personally present and performed or re-performed the history, physical exam and medical decision making activities of this service and have verified that the service and findings are accurately documented in the student's note.  [name], DO                  09/10/2022, 3:59 PM

## 2022-09-12 ENCOUNTER — Other Ambulatory Visit: Payer: Self-pay

## 2022-09-12 MED ORDER — ACCU-CHEK SOFTCLIX LANCETS MISC: 12 refills | Status: AC

## 2022-09-12 NOTE — Telephone Encounter (Signed)
Patient LVM on nurse line requesting a prescription for lancets.   She reports she has meter and strips already.   Will forward to PCP.

## 2022-09-18 DIAGNOSIS — K080 Exfoliation of teeth due to systemic causes: Secondary | ICD-10-CM | POA: Diagnosis not present

## 2022-10-08 DIAGNOSIS — K080 Exfoliation of teeth due to systemic causes: Secondary | ICD-10-CM | POA: Diagnosis not present

## 2022-10-11 DIAGNOSIS — K080 Exfoliation of teeth due to systemic causes: Secondary | ICD-10-CM | POA: Diagnosis not present

## 2022-10-16 DIAGNOSIS — K080 Exfoliation of teeth due to systemic causes: Secondary | ICD-10-CM | POA: Diagnosis not present

## 2022-10-22 DIAGNOSIS — N200 Calculus of kidney: Secondary | ICD-10-CM | POA: Diagnosis not present

## 2022-10-25 DIAGNOSIS — K080 Exfoliation of teeth due to systemic causes: Secondary | ICD-10-CM | POA: Diagnosis not present

## 2022-11-07 DIAGNOSIS — K080 Exfoliation of teeth due to systemic causes: Secondary | ICD-10-CM | POA: Diagnosis not present

## 2022-11-08 ENCOUNTER — Telehealth: Payer: Self-pay

## 2022-11-08 MED ORDER — DAPAGLIFLOZIN PROPANEDIOL 5 MG PO TABS: 5.0000 mg | ORAL_TABLET | Freq: Every day | ORAL | 3 refills | Status: DC

## 2022-11-08 NOTE — Telephone Encounter (Signed)
Rec'd fax from patient assistance company [employer] requesting refills for patients farxiga medication.  Please send refills to [facility] pharmacy. 90 day supply w/ refills.

## 2022-11-12 ENCOUNTER — Other Ambulatory Visit: Payer: Self-pay | Admitting: Student

## 2022-11-12 DIAGNOSIS — F411 Generalized anxiety disorder: Secondary | ICD-10-CM

## 2022-11-23 ENCOUNTER — Ambulatory Visit (INDEPENDENT_AMBULATORY_CARE_PROVIDER_SITE_OTHER): Payer: Medicare Other

## 2022-11-23 VITALS — Ht 66.0 in | Wt 206.0 lb

## 2022-11-23 DIAGNOSIS — Z0000 Encounter for general adult medical examination without abnormal findings: Secondary | ICD-10-CM

## 2022-11-23 DIAGNOSIS — Z780 Asymptomatic menopausal state: Secondary | ICD-10-CM

## 2022-11-23 NOTE — Patient Instructions (Signed)
Allison [name] , Thank you for taking time to come for your Medicare Wellness Visit. I appreciate your ongoing commitment to your health goals. Please review the following plan we discussed and let me know if I can assist you in the future.   Referrals/Orders/Follow-Ups/Clinician Recommendations: Aim for 30 minutes of exercise or brisk walking, 6-8 glasses of water, and 5 servings of fruits and vegetables each day.  This is a list of the screening recommended for you and due dates:  Health Maintenance  Topic Date Due   Eye exam for diabetics  Never done   Zoster (Shingles) Vaccine (1 of 2) Never done   DEXA scan (bone density measurement)  Never done   COVID-19 Vaccine (7 - 2023-24 season) 06/03/2022   Flu Shot  11/01/2022   Mammogram  09/10/2023*   Colon Cancer Screening  09/10/2023*   Yearly kidney function blood test for diabetes  02/03/2023   Yearly kidney health urinalysis for diabetes  02/03/2023   Hemoglobin A1C  03/12/2023   Complete foot exam   09/10/2023   Medicare Annual Wellness Visit  11/23/2023   DTaP/Tdap/Td vaccine (4 - Td or Tdap) 06/11/2024   Pneumonia Vaccine  Completed   Hepatitis C Screening  Completed   HPV Vaccine  Aged Out  *Topic was postponed. The date shown is not the original due date.    Advanced directives: (ACP Link)Information on Advanced Care Planning can be found at [facility] Advance Health Care Directives Advance Health Care Directives ([email or web address]   Next Medicare Annual Wellness Visit scheduled for next year: Yes

## 2022-11-23 NOTE — Progress Notes (Signed)
Subjective:   Allison [name] is a 67 y.o. female who presents for an Initial Medicare Annual Wellness Visit.  Visit Complete: Virtual  I connected with  Allison [name] on 11/23/22 by a audio enabled telemedicine application and verified that I am speaking with the correct person using two identifiers.  Patient Location: Home  Provider Location: Home Office  I discussed the limitations of evaluation and management by telemedicine. The patient expressed understanding and agreed to proceed.  Vital Signs: Because this visit was a virtual/telehealth visit, some criteria may be missing or patient reported. Any vitals not documented were not able to be obtained and vitals that have been documented are patient reported.   Review of Systems     Cardiac Risk Factors include: advanced age (>58men, >61 women);diabetes mellitus;dyslipidemia;hypertension;sedentary lifestyle     Objective:    Today's Vitals   11/23/22 0843  Weight: 206 lb (93.4 kg)  Height: 5\' 6"  (1.676 m)   Body mass index is 33.25 kg/m.     11/23/2022    8:50 AM 07/16/2022    8:54 AM 04/12/2022    9:00 AM 02/27/2022   11:38 AM 02/16/2022    8:32 AM 02/09/2022   10:40 AM 02/02/2022    8:54 AM  Advanced Directives  Does Patient Have a Medical Advance Directive? No No No No No No No  Would patient like information on creating a medical advance directive? Yes (MAU/Ambulatory/Procedural Areas - Information given) Yes (MAU/Ambulatory/Procedural Areas - Information given) No - Patient declined No - Patient declined No - Patient declined No - Patient declined No - Patient declined    Current Medications (verified) Outpatient Encounter Medications as of 11/23/2022  Medication Sig   Accu-Chek Softclix Lancets lancets Use to check blood sugar 4x per day. E11.9   albuterol (VENTOLIN HFA) 108 (90 Base) MCG/ACT inhaler Inhale 2-4 puffs into the lungs every 4 (four) hours as needed for wheezing (or cough).   allopurinol  (ZYLOPRIM) 300 MG tablet Take 300 mg by mouth daily.   ALPRAZolam (XANAX) 0.5 MG tablet TAKE 1 TABLET BY MOUTH ONCE DAILY AS NEEDED FOR ANXIETY   Blood Glucose Monitoring Suppl (ACCU-CHEK GUIDE ME) w/Device KIT USE UP TO 4 TIMES DAILY AS DIRECTED   cetirizine (ZYRTEC) 10 MG tablet Take 1 tablet (10 mg total) by mouth daily.   dapagliflozin propanediol (FARXIGA) 5 MG TABS tablet Take 1 tablet (5 mg total) by mouth daily.   glucose blood test strip Use as instructed   levothyroxine (SYNTHROID) 100 MCG tablet Take 1 tablet (100 mcg total) by mouth daily before breakfast for 360 doses.   metFORMIN (GLUCOPHAGE) 1000 MG tablet Take 1 tablet (1,000 mg total) by mouth 2 (two) times daily.   olmesartan (BENICAR) 20 MG tablet Take 1 tablet (20 mg total) by mouth at bedtime.   pravastatin (PRAVACHOL) 40 MG tablet Take 1 tablet (40 mg total) by mouth at bedtime.   Semaglutide,0.25 or 0.5MG /DOS, 2 MG/3ML SOPN Inject 0.25 mg into the skin once a week. Increase to 0.5mg  after 4 doses. Then 1mg  after 4 doses of that.   fluticasone (FLONASE) 50 MCG/ACT nasal spray Place 1 spray into both nostrils daily for 7 days.   [DISCONTINUED] albuterol (PROVENTIL,VENTOLIN) 90 MCG/ACT inhaler Inhale 2 puffs into the lungs every 6 (six) hours as needed.   No facility-administered encounter medications on file as of 11/23/2022.    Allergies (verified) Crestor [rosuvastatin], Losartan, Oxycodone, Simvastatin, and Tramadol hcl   History: Past Medical History:  Diagnosis Date   Anxiety    Arthritis    "knees" (06/04/2016)   Asthma    seasonal   Chronic bronchitis (HCC)    History of anemia    History of gout    History of kidney stones    Hyperlipidemia    Hypertension    Doctor discontinued medication after weight loss   Hypothyroidism    Nephrolithiasis 10/18/2011   Pneumonia ~ 2013; 09/2015; 03/2016   Pulmonary embolism (HCC) ~ 2013   "2 big ones",    Thyroid goiter    Type II diabetes mellitus (HCC)    Uric  acid renal calculus 11/24/2012   Recurrent nephrolithiasis. Stone on right side. Last seen by urology on 02/04/17 and started on potassium citrate 15 meq twice a day. Supposed to follow up in 3 months   Uterine cancer (HCC) 2000   Hysterectomy   Ventral hernia    Past Surgical History:  Procedure Laterality Date   CHOLECYSTECTOMY N/A 06/04/2016   Procedure: LAPAROSCOPIC CHOLECYSTECTOMY;  Surgeon: Emelia Loron, MD;  Location: [facility] OR;  Service: General;  Laterality: N/A;  LAPAROSCOPIC CHOLECYSTECTOMY   CYSTOSCOPY W/ RETROGRADES Bilateral 12/18/2016   Procedure: CYSTOSCOPY WITH RETROGRADE PYELOGRAM;  Surgeon: Hildred Laser, MD;  Location: [facility];  Service: Urology;  Laterality: Bilateral;   CYSTOSCOPY W/ URETERAL STENT REMOVAL Right 12/18/2016   Procedure: CYSTOSCOPY WITH STENT REMOVAL;  Surgeon: Hildred Laser, MD;  Location: [facility];  Service: Urology;  Laterality: Right;   CYSTOSCOPY WITH RETROGRADE PYELOGRAM, URETEROSCOPY AND STENT PLACEMENT Right 04/17/2016   Procedure: CYSTOSCOPY WITH RETROGRADE PYELOGRAM, URETEROSCOPY AND STENT PLACEMENT;  Surgeon: Hildred Laser, MD;  Location: [facility];  Service: Urology;  Laterality: Right;   CYSTOSCOPY/URETEROSCOPY/HOLMIUM LASER/STENT PLACEMENT Bilateral 12/18/2016   Procedure: CYSTOSCOPY/URETEROSCOPY/HOLMIUM LASER/    BILATERAL STENT PLACEMENT;  Surgeon: Hildred Laser, MD;  Location: [facility];  Service: Urology;  Laterality: Bilateral;   DILATION AND CURETTAGE OF UTERUS     S/P miscarriage   HOLMIUM LASER APPLICATION Bilateral 12/18/2016   Procedure: HOLMIUM LASER APPLICATION;  Surgeon: Hildred Laser, MD;  Location: [facility];  Service: Urology;  Laterality: Bilateral;   IR URETERAL STENT PLACEMENT EXISTING ACCESS RIGHT  10/31/2020   IR URETERAL STENT RIGHT NEW ACCESS W/O SEP NEPHROSTOMY CATH  11/20/2016   LAPAROSCOPIC CHOLECYSTECTOMY  06/04/2016    NEPHROLITHOTOMY Right 11/20/2016   Procedure: NEPHROLITHOTOMY PERCUTANEOUS;  Surgeon: Hildred Laser, MD;  Location: [facility];  Service: Urology;  Laterality: Right;   NEPHROLITHOTOMY Right 10/31/2020   Procedure: RIGHT NEPHROLITHOTOMY PERCUTANEOUS, INSERTION OF RIGHT URETERAL STENT;  Surgeon: Crista Elliot, MD;  Location: [facility];  Service: Urology;  Laterality: Right;  REQUESTING 2 HRS   TONSILLECTOMY     TOTAL ABDOMINAL HYSTERECTOMY  2000s   "endometrial CA"   TOTAL THYROIDECTOMY  2012   "had goiter on it"-one lobe removed   Family History  Adopted: Yes  Problem Relation Age of Onset   CAD Brother 96   Social History   Socioeconomic History   Marital status: Married    Spouse name: Not on file   Number of children: 2   Years of education: Not on file   Highest education level: Not on file  Occupational History   Not on file  Tobacco Use   Smoking status: Never   Smokeless tobacco: Never  Vaping Use   Vaping status: Not on file  Substance and Sexual Activity  Alcohol use: No   Drug use: No   Sexual activity: Never    Birth control/protection: Surgical  Other Topics Concern   Not on file  Social History Narrative   Patient cares for 2 grand children while her daughter is at work. This limits her ability to have appointments.   Social Determinants of Health   Financial Resource Strain: Low Risk  (11/23/2022)   Overall Financial Resource Strain (CARDIA)    Difficulty of Paying Living Expenses: Not hard at all  Food Insecurity: No Food Insecurity (11/23/2022)   Hunger Vital Sign    Worried About Running Out of Food in the Last Year: Never true    Ran Out of Food in the Last Year: Never true  Transportation Needs: No Transportation Needs (11/23/2022)   PRAPARE - Administrator, Civil Service (Medical): No    Lack of Transportation (Non-Medical): No  Physical Activity: Insufficiently Active (11/23/2022)   Exercise Vital Sign    Days of Exercise per Week:  3 days    Minutes of Exercise per Session: 30 min  Stress: No Stress Concern Present (11/23/2022)   [employer] of Occupational Health - Occupational Stress Questionnaire    Feeling of Stress : Not at all  Social Connections: Moderately Isolated (11/23/2022)   Social Connection and Isolation Panel [NHANES]    Frequency of Communication with Friends and Family: More than three times a week    Frequency of Social Gatherings with Friends and Family: Three times a week    Attends Religious Services: Never    Active Member of Clubs or Organizations: No    Attends Engineer, structural: Never    Marital Status: Married    Tobacco Counseling Counseling given: Not Answered   Clinical Intake:  Pre-visit preparation completed: Yes  Pain : No/denies pain     Diabetes: Yes CBG done?: No Did pt. bring in CBG monitor from home?: No  How often do you need to have someone help you when you read instructions, pamphlets, or other written materials from your doctor or pharmacy?: 1 - Never  Interpreter Needed?: No  Information entered by :: Kandis Fantasia LPN   Activities of Daily Living    11/23/2022    8:50 AM  In your present state of health, do you have any difficulty performing the following activities:  Hearing? 0  Vision? 0  Difficulty concentrating or making decisions? 0  Walking or climbing stairs? 0  Dressing or bathing? 0  Doing errands, shopping? 0  Preparing Food and eating ? N  Using the Toilet? N  In the past six months, have you accidently leaked urine? N  Do you have problems with loss of bowel control? N  Managing your Medications? N  Managing your Finances? N  Housekeeping or managing your Housekeeping? N    Patient Care Team: [name], DO as PCP - General (Family Medicine) Juanell Fairly, RN as [facility] Care Management  Indicate any recent Medical Services you may have received from other than [facility] providers in the past year  (date may be approximate).     Assessment:   This is a routine wellness examination for [place].  Hearing/Vision screen Hearing Screening - Comments:: Denies hearing difficulties   Vision Screening - Comments:: No vision problems; will schedule routine eye exam soon    Dietary issues and exercise activities discussed:     Goals Addressed  This Visit's Progress    Remain active and independent        Depression Screen    11/23/2022    8:48 AM 07/16/2022    8:59 AM 04/12/2022    9:01 AM 02/27/2022   11:39 AM 02/16/2022    8:32 AM 02/09/2022   10:41 AM 02/02/2022    8:54 AM  PHQ 2/9 Scores  PHQ - 2 Score 0 0 1 0 1 0 0  PHQ- 9 Score 0 0  0  7 6    Fall Risk    11/23/2022    8:49 AM 07/27/2022   11:08 AM 07/16/2022    8:54 AM 04/12/2022    9:01 AM 02/16/2022    8:32 AM  Fall Risk   Falls in the past year? 0 0 0 1 1  Number falls in past yr: 0   1 1  Injury with Fall? 0   0 0  Risk for fall due to : No Fall Risks      Follow up Falls prevention discussed;Education provided;Falls evaluation completed        MEDICARE RISK AT HOME: Medicare Risk at Home Any stairs in or around the home?: No If so, are there any without handrails?: No Home free of loose throw rugs in walkways, pet beds, electrical cords, etc?: Yes Adequate lighting in your home to reduce risk of falls?: Yes Life alert?: No Use of a cane, walker or w/c?: No Grab bars in the bathroom?: Yes Shower chair or bench in shower?: No Elevated toilet seat or a handicapped toilet?: Yes  TIMED UP AND GO:  Was the test performed? No    Cognitive Function:        11/23/2022    8:50 AM  6CIT Screen  What Year? 0 points  What month? 0 points  What time? 0 points  Count back from 20 0 points  Months in reverse 0 points  Repeat phrase 0 points  Total Score 0 points    Immunizations Immunization History  Administered Date(s) Administered   COVID-19, mRNA, vaccine(Comirnaty)12 years and  older 02/02/2022   Fluad Quad(high Dose 65+) 02/02/2022   H1N1 03/09/2008   Influenza Split 12/25/2010, 12/21/2011   Influenza Whole 01/17/2010   Influenza, Seasonal, Injecte, Preservative Fre 11/27/2014   Influenza,inj,Quad PF,6+ Mos 11/28/2012, 12/19/2013, 12/02/2015, 01/20/2016, 12/28/2017   Influenza-Unspecified 12/31/2016, 12/15/2017, 12/29/2019, 01/24/2021   PFIZER Comirnaty(Gray Top)Covid-19 Tri-Sucrose Vaccine 08/15/2020   PFIZER(Purple Top)SARS-COV-2 Vaccination 08/22/2019, 09/12/2019, 03/24/2020   PNEUMOCOCCAL CONJUGATE-20 04/04/2021   Pfizer Covid-19 Vaccine Bivalent Booster 5yrs & up 02/05/2021   Pneumococcal Polysaccharide-23 06/10/2008, 05/13/2015   Td 06/10/2008   Tdap 02/27/2011, 06/12/2014    TDAP status: Up to date  Flu Vaccine status: Due, Education has been provided regarding the importance of this vaccine. Advised may receive this vaccine at local pharmacy or Health Dept. Aware to provide a copy of the vaccination record if obtained from local pharmacy or Health Dept. Verbalized acceptance and understanding.  Pneumococcal vaccine status: Up to date  Covid-19 vaccine status: Information provided on how to obtain vaccines.   Qualifies for Shingles Vaccine? Yes   Zostavax completed No   Shingrix Completed?: No.    Education has been provided regarding the importance of this vaccine. Patient has been advised to call insurance company to determine out of pocket expense if they have not yet received this vaccine. Advised may also receive vaccine at local pharmacy or Health Dept. Verbalized acceptance and understanding.  Screening Tests Health  Maintenance  Topic Date Due   OPHTHALMOLOGY EXAM  Never done   Zoster Vaccines- Shingrix (1 of 2) Never done   DEXA SCAN  Never done   COVID-19 Vaccine (7 - 2023-24 season) 06/03/2022   INFLUENZA VACCINE  11/01/2022   MAMMOGRAM  09/10/2023 (Originally 01/08/2011)   Colonoscopy  09/10/2023 (Originally 10/19/2000)   Diabetic  kidney evaluation - eGFR measurement  02/03/2023   Diabetic kidney evaluation - Urine ACR  02/03/2023   HEMOGLOBIN A1C  03/12/2023   FOOT EXAM  09/10/2023   Medicare Annual Wellness (AWV)  11/23/2023   DTaP/Tdap/Td (4 - Td or Tdap) 06/11/2024   Pneumonia Vaccine 96+ Years old  Completed   Hepatitis C Screening  Completed   HPV VACCINES  Aged Out    Health Maintenance  Health Maintenance Due  Topic Date Due   OPHTHALMOLOGY EXAM  Never done   Zoster Vaccines- Shingrix (1 of 2) Never done   DEXA SCAN  Never done   COVID-19 Vaccine (7 - 2023-24 season) 06/03/2022   INFLUENZA VACCINE  11/01/2022    Colorectal cancer screening:  Patient declines at this time   Mammogram status: Ordered 02/02/22. Pt provided with contact info and advised to call to schedule appt.   Bone Density status: Ordered today. Pt provided with contact info and advised to call to schedule appt.  Lung Cancer Screening: (Low Dose CT Chest recommended if Age 17-80 years, 20 pack-year currently smoking OR have quit w/in 15years.) does not qualify.   Lung Cancer Screening Referral: n/a  Additional Screening:  Hepatitis C Screening: does qualify; Completed 09/16/15  Vision Screening: Recommended annual ophthalmology exams for early detection of glaucoma and other disorders of the eye. Is the patient up to date with their annual eye exam?  No  Who is the provider or what is the name of the office in which the patient attends annual eye exams? none If pt is not established with a provider, would they like to be referred to a provider to establish care? No .   Dental Screening: Recommended annual dental exams for proper oral hygiene  Diabetic Foot Exam: Diabetic Foot Exam: Completed 09/10/22  Community Resource Referral / Chronic Care Management: CRR required this visit?  No   CCM required this visit?  No     Plan:     I have personally reviewed and noted the following in the patient's chart:   Medical and  social history Use of alcohol, tobacco or illicit drugs  Current medications and supplements including opioid prescriptions. Patient is not currently taking opioid prescriptions. Functional ability and status Nutritional status Physical activity Advanced directives List of other physicians Hospitalizations, surgeries, and ER visits in previous 12 months Vitals Screenings to include cognitive, depression, and falls Referrals and appointments  In addition, I have reviewed and discussed with patient certain preventive protocols, quality metrics, and best practice recommendations. A written personalized care plan for preventive services as well as general preventive health recommendations were provided to patient.     Kandis Fantasia [place], [place]   8/46/9629   After Visit Summary: (Mail) Due to this being a telephonic visit, the after visit summary with patients personalized plan was offered to patient via mail   Nurse Notes: No concerns at this time

## 2022-12-06 ENCOUNTER — Telehealth: Payer: Self-pay

## 2022-12-06 NOTE — Telephone Encounter (Signed)
Patient calls nurse line requesting medication assistance with Ozempic.   She reports that she only has one more dose left and needs assistance due to cost.   Will forward to [place] and med assistance team.   Veronda Prude, RN

## 2022-12-07 ENCOUNTER — Other Ambulatory Visit (HOSPITAL_COMMUNITY): Payer: Self-pay

## 2022-12-07 NOTE — Telephone Encounter (Signed)
Reached out to novo nordisk to process refill (which was supposed to be scheduled 11/03/22). Rep was able to process refill today.  Pt picked up last order 08/22/22 for a 4 month supply. Should have possibly 2 weeks worth remaining.   Pt aware. Has one shot left.

## 2022-12-07 NOTE — Telephone Encounter (Signed)
Correction 4 pens*

## 2022-12-12 DIAGNOSIS — K080 Exfoliation of teeth due to systemic causes: Secondary | ICD-10-CM | POA: Diagnosis not present

## 2022-12-15 NOTE — Progress Notes (Unsigned)
  SUBJECTIVE:   CHIEF COMPLAINT / HPI:   Type 2 Diabetes: Home medications include: Semaglutide 1 mg, metformin 1000 mg twice daily, Farxiga 5 mg daily. {Blank single:19197::"Does","Does not"} endorse compliance. Home glucose monitoring {home testing:315145}.  Patient is not up to date on diabetic eye. Patient is up to date on diabetic foot exam.   PERTINENT  PMH / PSH: T2DM, HTN, hypothyroidism, GERD, asthma, HLD, CKD stage III  OBJECTIVE:  There were no vitals taken for this visit. Physical Exam   ASSESSMENT/PLAN:  There are no diagnoses linked to this encounter. No follow-ups on file. Allison Mattocks, DO 12/15/2022, 10:35 PM PGY-***, [facility] {    This will disappear when note is signed, click to select method of visit    :1}

## 2022-12-17 ENCOUNTER — Ambulatory Visit (INDEPENDENT_AMBULATORY_CARE_PROVIDER_SITE_OTHER): Payer: Medicare Other | Admitting: Student

## 2022-12-17 ENCOUNTER — Encounter: Payer: Self-pay | Admitting: Student

## 2022-12-17 VITALS — BP 131/60 | HR 88 | Temp 98.5°F | Ht 67.0 in | Wt 195.6 lb

## 2022-12-17 DIAGNOSIS — E118 Type 2 diabetes mellitus with unspecified complications: Secondary | ICD-10-CM | POA: Diagnosis not present

## 2022-12-17 DIAGNOSIS — Z23 Encounter for immunization: Secondary | ICD-10-CM | POA: Diagnosis not present

## 2022-12-17 DIAGNOSIS — I10 Essential (primary) hypertension: Secondary | ICD-10-CM | POA: Diagnosis not present

## 2022-12-17 LAB — POCT GLYCOSYLATED HEMOGLOBIN (HGB A1C): HbA1c, POC (controlled diabetic range): 6.2 % (ref 0.0–7.0)

## 2022-12-17 MED ORDER — OLMESARTAN MEDOXOMIL 20 MG PO TABS: 10.0000 mg | ORAL_TABLET | Freq: Every day | ORAL | Status: DC

## 2022-12-17 MED ORDER — SEMAGLUTIDE(0.25 OR 0.5MG/DOS) 2 MG/3ML ~~LOC~~ SOPN: 0.2500 mg | PEN_INJECTOR | SUBCUTANEOUS | Status: DC

## 2022-12-17 NOTE — Assessment & Plan Note (Addendum)
Well-controlled, she has had some nausea related to the olmesartan.  She does have a wide pulse pressure so blood pressure control may be difficult but we have agreed to reduce to olmesartan 10 mg daily as this still provides renal protection.

## 2022-12-17 NOTE — Patient Instructions (Addendum)
It was great to see you today! Thank you for choosing [facility] for your primary care.  Today we addressed: Thank you for getting your flu shot today. I am glad you have your ophthalmology appointment scheduled.  Your A1c is 6.2, excellent job. Hypertension: Lets go to half the dose of your blood pressure medication.  Keep in mind this medication also helps protect your kidneys for your diabetes.  If you haven't already, sign up for My Chart to have easy access to your labs results, and communication with your primary care physician. I recommend that you always bring your medications to each appointment as this makes it easy to ensure you are on the correct medications and helps [place] not miss refills when you need them. Return in about 3 months (around 03/18/2023) for Diabetes. Please arrive 15 minutes before your appointment to ensure smooth check in process.  We appreciate your efforts in making this happen.  Thank you for allowing me to participate in your care, [name], DO 12/17/2022, 8:57 AM PGY-3, [facility]

## 2022-12-17 NOTE — Assessment & Plan Note (Addendum)
well controlled - Last A1c: 6.2% today - Medications: Metformin 1000 g twice daily, Farxiga 5 mg daily, semaglutide 1 mg weekly.  Provided 1 month sample of semaglutide. - She has ophthalmology appointment scheduled on 01/02/2023.

## 2022-12-25 ENCOUNTER — Telehealth: Payer: Self-pay

## 2022-12-25 NOTE — Telephone Encounter (Signed)
Informed patient her novo nordisk shipment is ready for pickup.  4 boxes of ozempic 1mg  dose pens are labeled and ready in med room fridge.

## 2022-12-26 NOTE — Telephone Encounter (Signed)
Patient presents to clinic to pick up medication.   Provided patient with shipment per note from [place].   Veronda Prude, RN

## 2023-01-02 DIAGNOSIS — H2513 Age-related nuclear cataract, bilateral: Secondary | ICD-10-CM | POA: Diagnosis not present

## 2023-01-02 DIAGNOSIS — E119 Type 2 diabetes mellitus without complications: Secondary | ICD-10-CM | POA: Diagnosis not present

## 2023-01-15 DIAGNOSIS — N200 Calculus of kidney: Secondary | ICD-10-CM | POA: Diagnosis not present

## 2023-01-15 DIAGNOSIS — R300 Dysuria: Secondary | ICD-10-CM | POA: Diagnosis not present

## 2023-01-15 LAB — HM DIABETES EYE EXAM

## 2023-01-22 ENCOUNTER — Other Ambulatory Visit: Payer: Self-pay | Admitting: Student

## 2023-01-22 DIAGNOSIS — E890 Postprocedural hypothyroidism: Secondary | ICD-10-CM

## 2023-01-22 DIAGNOSIS — E1169 Type 2 diabetes mellitus with other specified complication: Secondary | ICD-10-CM

## 2023-01-22 DIAGNOSIS — F411 Generalized anxiety disorder: Secondary | ICD-10-CM

## 2023-01-29 ENCOUNTER — Telehealth: Payer: Self-pay

## 2023-01-29 NOTE — Telephone Encounter (Signed)
Patient calls nurse line in regards to Levothyroxine prescription.   She reports [facility] stated they did not have this ready for her.   I called [facility] and the issue is a manufacturer change.   She reports they can not dispense the new medication without provider approval.   Advised will forward to PCP.

## 2023-01-30 ENCOUNTER — Other Ambulatory Visit: Payer: Self-pay | Admitting: Student

## 2023-01-30 DIAGNOSIS — E890 Postprocedural hypothyroidism: Secondary | ICD-10-CM

## 2023-01-30 DIAGNOSIS — I872 Venous insufficiency (chronic) (peripheral): Secondary | ICD-10-CM

## 2023-01-30 MED ORDER — LEVOTHYROXINE SODIUM 100 MCG PO TABS
100.0000 ug | ORAL_TABLET | Freq: Every day | ORAL | 0 refills | Status: DC
Start: 2023-01-30 — End: 2023-04-11

## 2023-03-14 ENCOUNTER — Other Ambulatory Visit: Payer: Self-pay | Admitting: Student

## 2023-03-14 DIAGNOSIS — F411 Generalized anxiety disorder: Secondary | ICD-10-CM

## 2023-03-14 DIAGNOSIS — E118 Type 2 diabetes mellitus with unspecified complications: Secondary | ICD-10-CM

## 2023-04-04 ENCOUNTER — Other Ambulatory Visit: Payer: Medicare Other

## 2023-04-04 DIAGNOSIS — E890 Postprocedural hypothyroidism: Secondary | ICD-10-CM

## 2023-04-05 LAB — TSH: TSH: 0.038 u[IU]/mL — ABNORMAL LOW (ref 0.450–4.500)

## 2023-04-11 ENCOUNTER — Telehealth: Payer: Self-pay | Admitting: Student

## 2023-04-11 ENCOUNTER — Other Ambulatory Visit (HOSPITAL_COMMUNITY): Payer: Self-pay

## 2023-04-11 ENCOUNTER — Other Ambulatory Visit: Payer: Self-pay | Admitting: Student

## 2023-04-11 DIAGNOSIS — E118 Type 2 diabetes mellitus with unspecified complications: Secondary | ICD-10-CM

## 2023-04-11 DIAGNOSIS — I10 Essential (primary) hypertension: Secondary | ICD-10-CM

## 2023-04-11 DIAGNOSIS — E1169 Type 2 diabetes mellitus with other specified complication: Secondary | ICD-10-CM

## 2023-04-11 DIAGNOSIS — E890 Postprocedural hypothyroidism: Secondary | ICD-10-CM

## 2023-04-11 MED ORDER — LEVOTHYROXINE SODIUM 88 MCG PO TABS: 88.0000 ug | ORAL_TABLET | Freq: Every day | ORAL | 0 refills | Status: DC

## 2023-04-11 MED ORDER — PRAVASTATIN SODIUM 40 MG PO TABS: 40.0000 mg | ORAL_TABLET | Freq: Every evening | ORAL | 0 refills | Status: AC

## 2023-04-11 MED ORDER — OLMESARTAN MEDOXOMIL 5 MG PO TABS: 10.0000 mg | ORAL_TABLET | Freq: Every day | ORAL | 0 refills | Status: DC

## 2023-04-11 NOTE — Telephone Encounter (Signed)
 Discussed TSH ReFlex hyperthyroid.  Adjusted levothyroxine  from 100 mcg to 88 mcg.  Recheck in 2 months.  She noted that she was running out of Farxiga , Ozempic , pravastatin .  Rx sent for pravastatin .  She receives Farxiga  through mail pharmacy and Ozempic  from clinic so I will reach out to pharmacy tech here for assistance.  She also notes olmesartan  which she was supposed to cut in half she has been unable to.  As she was post to be on olmesartan  10 mg I have sent in Rx for 5 mg tablets and she will take 2 of these daily.  She understood plan and to make appointment approximately mid March.

## 2023-04-19 ENCOUNTER — Other Ambulatory Visit (HOSPITAL_COMMUNITY): Payer: Self-pay

## 2023-04-19 ENCOUNTER — Telehealth: Payer: Self-pay

## 2023-04-19 DIAGNOSIS — E118 Type 2 diabetes mellitus with unspecified complications: Secondary | ICD-10-CM

## 2023-04-19 NOTE — Progress Notes (Unsigned)
Pharmacy Medication Assistance Program Note    04/30/2023  Patient ID: Allison [name], female  DOB: 1956-02-17, 68 y.o.  MRN:  [id number]     04/19/2023  Outreach Medication Two  Manufacturer Medication Two Novo Nordisk  Nordisk Drugs Ozempic  Dose of Ozempic 1MG   Type of Radiographer, therapeutic Assistance  Name of Prescriber Pearlean Brownie  Date Application Received From Provider 04/30/2023  Method Application Sent to Manufacturer Online  Date Application Submitted to Manufacturer 04/19/2023

## 2023-04-23 NOTE — Progress Notes (Signed)
 Pharmacy Medication Assistance Program Note    07/15/2023  Patient ID: OLENA WILLY, female   DOB: 12-Aug-1955, 68 y.o.   MRN: [id number]     04/19/2023  Outreach Medication One  Manufacturer Medication One Nurse, adult Drugs Farxiga  Type of Assistance Manufacturer Assistance  Date Application Sent to Patient 04/23/2023  Application Items Requested Application  Date Application Received From Patient 05/21/2023  Application Items Received From Patient Application  Date Application Submitted to Manufacturer 05/21/2023  Method Application Sent to Manufacturer Fax  Patient Assistance Determination Approved  Approval End Date 04/01/2024    RENEWAL APPROVED.

## 2023-04-30 ENCOUNTER — Other Ambulatory Visit (HOSPITAL_COMMUNITY): Payer: Self-pay

## 2023-04-30 NOTE — Telephone Encounter (Addendum)
Patient takes last shot this upcoming Friday. Ran test claim to checks patients copay with her over the phone. One month supply is $420.   Informed patient that once approved the medication will take 10-14 business days (or possibly longer) before it arrives at the office. Will provide one box of 1mg  dose ozempic in the meantime.   Medication Samples have been provided to the patient.  Drug name: OZEMPIC       Strength: 1MG  DOSE PENS        Qty: 1 BOX  LOT: [id number]  Exp.Date: 07/30/25  Dosing instructions: INJECT 1MG  ONCE WEEKLY.  Allison [name] 3:06 PM 04/30/2023

## 2023-05-02 NOTE — Telephone Encounter (Signed)
Sample given to patient.

## 2023-05-08 NOTE — Progress Notes (Signed)
 Pharmacy Medication Assistance Program Note    05/08/2023  Patient ID: Allison [name], female  DOB: January 18, 1956, 68 y.o.  MRN:  [id number]     04/19/2023  Outreach Medication Two  Manufacturer Medication Two Novo Nordisk  Nordisk Drugs Ozempic   Dose of Ozempic  1MG   Type of Radiographer, Therapeutic Assistance  Name of Prescriber Layman Bee  Date Application Received From Provider 04/30/2023  Method Application Sent to Manufacturer Online  Date Application Submitted to Manufacturer 04/19/2023  Patient Assistance Determination Approved  Approval Start Date 05/07/2023

## 2023-05-16 ENCOUNTER — Other Ambulatory Visit: Payer: Self-pay | Admitting: Student

## 2023-05-16 DIAGNOSIS — K080 Exfoliation of teeth due to systemic causes: Secondary | ICD-10-CM | POA: Diagnosis not present

## 2023-05-16 DIAGNOSIS — F411 Generalized anxiety disorder: Secondary | ICD-10-CM

## 2023-05-22 ENCOUNTER — Other Ambulatory Visit: Payer: Self-pay | Admitting: Student

## 2023-05-22 DIAGNOSIS — E118 Type 2 diabetes mellitus with unspecified complications: Secondary | ICD-10-CM

## 2023-05-22 MED ORDER — DAPAGLIFLOZIN PROPANEDIOL 5 MG PO TABS: 5.0000 mg | ORAL_TABLET | Freq: Every day | ORAL | 3 refills | Status: AC

## 2023-05-23 ENCOUNTER — Other Ambulatory Visit: Payer: Self-pay | Admitting: Student

## 2023-05-23 DIAGNOSIS — E118 Type 2 diabetes mellitus with unspecified complications: Secondary | ICD-10-CM

## 2023-05-23 DIAGNOSIS — F411 Generalized anxiety disorder: Secondary | ICD-10-CM

## 2023-05-24 MED ORDER — METFORMIN HCL 1000 MG PO TABS: 1000.0000 mg | ORAL_TABLET | Freq: Two times a day (BID) | ORAL | 0 refills | Status: DC

## 2023-06-07 MED ORDER — SEMAGLUTIDE (1 MG/DOSE) 4 MG/3ML ~~LOC~~ SOPN
1.0000 mg | PEN_INJECTOR | SUBCUTANEOUS | 1 refills | Status: AC
Start: 2023-06-07 — End: ?

## 2023-06-07 NOTE — Addendum Note (Signed)
 Addended by: Tanya Nones on: 06/07/2023 09:47 AM   Modules accepted: Orders

## 2023-06-18 ENCOUNTER — Telehealth: Payer: Self-pay

## 2023-06-18 NOTE — Telephone Encounter (Signed)
 Left message informing patient her novo nordisk shipment is ready for pickup here at the office.   4 boxes of Ozempic 1mg  dose pens are labeled in med room fridge.

## 2023-06-19 ENCOUNTER — Ambulatory Visit (INDEPENDENT_AMBULATORY_CARE_PROVIDER_SITE_OTHER): Payer: Medicare Other | Admitting: Student

## 2023-06-19 ENCOUNTER — Encounter: Payer: Self-pay | Admitting: Student

## 2023-06-19 VITALS — BP 122/78 | HR 74 | Ht 66.0 in | Wt 184.4 lb

## 2023-06-19 DIAGNOSIS — E890 Postprocedural hypothyroidism: Secondary | ICD-10-CM

## 2023-06-19 DIAGNOSIS — M109 Gout, unspecified: Secondary | ICD-10-CM | POA: Insufficient documentation

## 2023-06-19 DIAGNOSIS — I10 Essential (primary) hypertension: Secondary | ICD-10-CM | POA: Diagnosis not present

## 2023-06-19 DIAGNOSIS — E1169 Type 2 diabetes mellitus with other specified complication: Secondary | ICD-10-CM

## 2023-06-19 DIAGNOSIS — E118 Type 2 diabetes mellitus with unspecified complications: Secondary | ICD-10-CM

## 2023-06-19 DIAGNOSIS — E785 Hyperlipidemia, unspecified: Secondary | ICD-10-CM

## 2023-06-19 LAB — POCT GLYCOSYLATED HEMOGLOBIN (HGB A1C): HbA1c, POC (controlled diabetic range): 5.9 % (ref 0.0–7.0)

## 2023-06-19 MED ORDER — OLMESARTAN MEDOXOMIL 5 MG PO TABS
5.0000 mg | ORAL_TABLET | Freq: Every day | ORAL | 0 refills | Status: AC
Start: 2023-06-19 — End: ?

## 2023-06-19 MED ORDER — METFORMIN HCL 500 MG PO TABS: 500.0000 mg | ORAL_TABLET | Freq: Two times a day (BID) | ORAL | 1 refills | Status: DC

## 2023-06-19 NOTE — Patient Instructions (Addendum)
 It was great to see you today! Thank you for choosing [facility] for your primary care.  Today we addressed: Diabetes: Your A1c was 5.9, this is low.  Lets decrease your metformin to 500 mg twice daily.  We will continue Ozempic and [place] for now.  Please eat 2-3 meals daily. Hypertension: Lets decrease your olmesartan to 5 mg daily.  If you haven't already, sign up for My Chart to have easy access to your labs results, and communication with your primary care physician. We are checking some labs today. If they are abnormal, I will call you. If they are normal, I will send you a MyChart message (if it is active) or a letter in the mail. If you do not hear about your labs in the next 2 weeks, please call the office. Return in about 3 months (around 09/19/2023) for Diabetes follow-up. Please arrive 15 minutes before your appointment to ensure smooth check in process.  We appreciate your efforts in making this happen.  Thank you for allowing me to participate in your care, Allison Mattocks, DO 06/19/2023, 9:21 AM PGY-3, [facility]

## 2023-06-19 NOTE — Assessment & Plan Note (Signed)
 Continue pravastatin 40mg  daily  Recheck lipid panel

## 2023-06-19 NOTE — Assessment & Plan Note (Signed)
 Continue Synthroid 88 mcg daily.  Recheck TSH.  Titrate accordingly.

## 2023-06-19 NOTE — Progress Notes (Signed)
  SUBJECTIVE:   CHIEF COMPLAINT / HPI:   Type 2 Diabetes: Home medications include: Ozempic 1 mg weekly, Farxiga 5 mg daily, metformin 1000 mg twice daily. Does endorse compliance. Home glucose monitoring is performed sporadically. Patient is up to date on diabetic eye.  Hypertension: She states that taking olmesartan 10 mg daily makes her fingers feel numb.  She has isolated not taking this medication and identified this is specifically the cause.  PERTINENT  PMH / PSH: T2DM, HTN, hypothyroidism, GERD, asthma, HLD, CKD stage III   OBJECTIVE:  BP 122/78   Pulse 74   Ht 5\' 6"  (1.676 m)   Wt 184 lb 6.4 oz (83.6 kg)   SpO2 100%   BMI 29.76 kg/m  General: Well-appearing, NAD CV: RRR, no murmurs auscultated Pulm: CTAB, normal WOB  ASSESSMENT/PLAN:   Assessment & Plan Type II diabetes mellitus with complication (HCC) A1c 5.9, this is quite low which I suspect is related to medication compliance in combination with decreased dietary intake.  Recommended 2-3 meals daily.  She has intentionally lost 11 pounds in the last 6 months.  Continue Ozempic 1 mg weekly, Farxiga 5 mg daily.  Decrease metformin to 500 mg twice daily.  Check BMP for kidney function.  Unable to collect microalbumin/creatinine ratio.  Follow-up in 3 months. Hyperlipidemia associated with type 2 diabetes mellitus (HCC) Continue pravastatin 40 mg daily.  Recheck lipid panel. Postoperative hypothyroidism Continue Synthroid 88 mcg daily.  Recheck TSH.  Titrate accordingly. HYPERTENSION, BENIGN SYSTEMIC Well-controlled, given potential side effect with olmesartan, decrease to 5 mg daily.  Recommend continuing at least low-dose for renal protective benefits secondary to diabetes and blood pressure management.  Return in about 3 months (around 09/19/2023) for Diabetes follow-up. [name], DO 06/19/2023, 9:50 AM PGY-3, [facility]

## 2023-06-19 NOTE — Assessment & Plan Note (Signed)
 A1c 5.9, this is quite low which I suspect is related to medication compliance in combination with decreased dietary intake.  Recommended 2-3 meals daily.  She has intentionally lost 11 pounds in the last 6 months.  Continue Ozempic 1 mg weekly, Farxiga 5 mg daily.  Decrease metformin to 500 mg twice daily.  Check BMP for kidney function.  Unable to collect microalbumin/creatinine ratio.  Follow-up in 3 months.

## 2023-06-19 NOTE — Assessment & Plan Note (Signed)
 Well-controlled, given potential side effect with olmesartan, decrease to 5 mg daily.  Recommend continuing at least low-dose for renal protective benefits secondary to diabetes and blood pressure management.

## 2023-06-20 LAB — BASIC METABOLIC PANEL
BUN/Creatinine Ratio: 15 (ref 12–28)
BUN: 15 mg/dL (ref 8–27)
CO2: 26 mmol/L (ref 20–29)
Calcium: 9.1 mg/dL (ref 8.7–10.3)
Chloride: 96 mmol/L (ref 96–106)
Creatinine, Ser: 0.97 mg/dL (ref 0.57–1.00)
Glucose: 109 mg/dL — ABNORMAL HIGH (ref 70–99)
Potassium: 4.4 mmol/L (ref 3.5–5.2)
Sodium: 140 mmol/L (ref 134–144)
eGFR: 64 mL/min/{1.73_m2} (ref >59)

## 2023-06-20 LAB — LIPID PANEL
Chol/HDL Ratio: 3 ratio (ref 0.0–4.4)
Cholesterol, Total: 175 mg/dL (ref 100–199)
HDL: 59 mg/dL (ref >39)
LDL Chol Calc (NIH): 96 mg/dL (ref 0–99)
Triglycerides: 114 mg/dL (ref 0–149)
VLDL Cholesterol Cal: 20 mg/dL (ref 5–40)

## 2023-06-20 LAB — TSH: TSH: 0.087 u[IU]/mL — ABNORMAL LOW (ref 0.450–4.500)

## 2023-06-24 ENCOUNTER — Other Ambulatory Visit: Payer: Self-pay | Admitting: Student

## 2023-06-24 DIAGNOSIS — E890 Postprocedural hypothyroidism: Secondary | ICD-10-CM

## 2023-06-25 ENCOUNTER — Telehealth: Payer: Self-pay | Admitting: Student

## 2023-06-25 MED ORDER — LEVOTHYROXINE SODIUM 75 MCG PO TABS: 75.0000 ug | ORAL_TABLET | Freq: Every day | ORAL | 0 refills | Status: DC

## 2023-06-25 NOTE — Telephone Encounter (Signed)
 Called patient about lab results.  TSH low reflecting hyperthyroid state.  Advise decreasing levothyroxine to 75 mcg daily.  Patient was amenable to this will recheck in 2 months.

## 2023-07-22 ENCOUNTER — Other Ambulatory Visit: Payer: Self-pay | Admitting: Student

## 2023-07-22 DIAGNOSIS — F411 Generalized anxiety disorder: Secondary | ICD-10-CM

## 2023-08-30 ENCOUNTER — Other Ambulatory Visit: Payer: Self-pay | Admitting: Student

## 2023-08-30 DIAGNOSIS — E890 Postprocedural hypothyroidism: Secondary | ICD-10-CM

## 2023-09-09 NOTE — Progress Notes (Unsigned)
  SUBJECTIVE:   CHIEF COMPLAINT / HPI:   Hypothyroidism: follow up today. Currently taking Synthroid  75mcg daily.  Type 2 Diabetes: Home medications include: Ozempic  1 mg weekly, Farxiga  5mg  daily, Metformin  500 mg BID. {Blank single:19197::"Does","Does not"} endorse compliance. Patient is up to date on diabetic eye.   PERTINENT  PMH / PSH: T2DM, HTN, hypothyroidism, GERD, asthma, HLD, CKD stage III   OBJECTIVE:  There were no vitals taken for this visit. ***  ASSESSMENT/PLAN:   Assessment & Plan  No follow-ups on file. Allison Goon, DO 09/09/2023, 10:38 PM PGY-3, [facility] {    This will disappear when note is signed, click to select method of visit    :1}

## 2023-09-10 ENCOUNTER — Ambulatory Visit: Attending: Family Medicine

## 2023-09-10 ENCOUNTER — Encounter: Payer: Self-pay | Admitting: *Deleted

## 2023-09-10 ENCOUNTER — Ambulatory Visit (INDEPENDENT_AMBULATORY_CARE_PROVIDER_SITE_OTHER): Admitting: Student

## 2023-09-10 ENCOUNTER — Encounter: Payer: Self-pay | Admitting: Student

## 2023-09-10 VITALS — BP 120/80 | HR 105 | Ht 66.0 in | Wt 188.0 lb

## 2023-09-10 DIAGNOSIS — R002 Palpitations: Secondary | ICD-10-CM | POA: Diagnosis not present

## 2023-09-10 DIAGNOSIS — E890 Postprocedural hypothyroidism: Secondary | ICD-10-CM | POA: Diagnosis not present

## 2023-09-10 DIAGNOSIS — R300 Dysuria: Secondary | ICD-10-CM

## 2023-09-10 DIAGNOSIS — F411 Generalized anxiety disorder: Secondary | ICD-10-CM

## 2023-09-10 DIAGNOSIS — E118 Type 2 diabetes mellitus with unspecified complications: Secondary | ICD-10-CM | POA: Diagnosis not present

## 2023-09-10 DIAGNOSIS — J45901 Unspecified asthma with (acute) exacerbation: Secondary | ICD-10-CM

## 2023-09-10 LAB — POCT UA - MICROSCOPIC ONLY

## 2023-09-10 LAB — POCT URINALYSIS DIP (CLINITEK)
Bilirubin, UA: NEGATIVE
Blood, UA: NEGATIVE
Glucose, UA: >=1000 mg/dL — AB
Ketones, POC UA: NEGATIVE mg/dL
Nitrite, UA: NEGATIVE
POC PROTEIN,UA: NEGATIVE
Spec Grav, UA: 1.015 (ref 1.010–1.025)
Urobilinogen, UA: 0.2 U/dL
pH, UA: 6 (ref 5.0–8.0)

## 2023-09-10 LAB — POCT GLYCOSYLATED HEMOGLOBIN (HGB A1C): HbA1c, POC (controlled diabetic range): 6.2 % (ref 0.0–7.0)

## 2023-09-10 MED ORDER — ALBUTEROL SULFATE HFA 108 (90 BASE) MCG/ACT IN AERS: 2.0000 | INHALATION_SPRAY | RESPIRATORY_TRACT | 2 refills | Status: AC | PRN

## 2023-09-10 MED ORDER — ALPRAZOLAM 0.5 MG PO TABS: 0.5000 mg | ORAL_TABLET | Freq: Every day | ORAL | 0 refills | Status: DC | PRN

## 2023-09-10 MED ORDER — LEVOTHYROXINE SODIUM 75 MCG PO TABS: 75.0000 ug | ORAL_TABLET | Freq: Every day | ORAL | 0 refills | Status: DC

## 2023-09-10 NOTE — Assessment & Plan Note (Signed)
 Recheck TSH.  Titrate Synthroid  accordingly, currently on 75 mcg daily.

## 2023-09-10 NOTE — Progress Notes (Unsigned)
 EP to read.

## 2023-09-10 NOTE — Assessment & Plan Note (Signed)
 A1c 6.2, well-controlled.  Continue metformin  500 mg twice daily, Farxiga  5 mg daily, Ozempic  1 mg weekly.

## 2023-09-10 NOTE — Patient Instructions (Addendum)
 It was great to see you today! Thank you for choosing [facility] for your primary care.  Today we addressed: A1c 6.2.  Continue medications as prescribed.  I have also refilled your Synthroid  and Xanax  and albuterol . Urine did not show signs of UTI.  Let me know if your symptoms worsen. Given your palpitations, I would like to get a heart monitor on you.  This is a 7-day monitor that you will wear and will be mailed to you and you mail back.  If you haven't already, sign up for My Chart to have easy access to your labs results, and communication with your primary care physician.  Return today (on 09/10/2023) for Lab appointment. Please arrive 15 minutes before your appointment to ensure smooth check in process.  We appreciate your efforts in making this happen.  Thank you for allowing me to participate in your care, Allison Goon, DO 09/10/2023, 11:20 AM PGY-3, [facility]

## 2023-09-11 ENCOUNTER — Other Ambulatory Visit

## 2023-09-11 ENCOUNTER — Other Ambulatory Visit: Payer: Self-pay | Admitting: *Deleted

## 2023-09-11 DIAGNOSIS — R002 Palpitations: Secondary | ICD-10-CM

## 2023-09-11 DIAGNOSIS — E890 Postprocedural hypothyroidism: Secondary | ICD-10-CM | POA: Diagnosis not present

## 2023-09-11 LAB — MICROALBUMIN / CREATININE URINE RATIO
Creatinine, Urine: 62.7 mg/dL
Microalb/Creat Ratio: 7 mg/g{creat} (ref 0–29)
Microalbumin, Urine: 4.2 ug/mL

## 2023-09-12 ENCOUNTER — Ambulatory Visit: Payer: Self-pay | Admitting: Student

## 2023-09-12 LAB — BASIC METABOLIC PANEL WITH GFR
BUN/Creatinine Ratio: 16 (ref 12–28)
BUN: 20 mg/dL (ref 8–27)
CO2: 25 mmol/L (ref 20–29)
Calcium: 9.5 mg/dL (ref 8.7–10.3)
Chloride: 97 mmol/L (ref 96–106)
Creatinine, Ser: 1.22 mg/dL — ABNORMAL HIGH (ref 0.57–1.00)
Glucose: 117 mg/dL — ABNORMAL HIGH (ref 70–99)
Potassium: 4.2 mmol/L (ref 3.5–5.2)
Sodium: 140 mmol/L (ref 134–144)
eGFR: 49 mL/min/{1.73_m2} — ABNORMAL LOW (ref >59)

## 2023-09-12 LAB — CBC
Hematocrit: 45.1 % (ref 34.0–46.6)
Hemoglobin: 15 g/dL (ref 11.1–15.9)
MCH: 31 pg (ref 26.6–33.0)
MCHC: 33.3 g/dL (ref 31.5–35.7)
MCV: 93 fL (ref 79–97)
Platelets: 189 10*3/uL (ref 150–450)
RBC: 4.84 x10E6/uL (ref 3.77–5.28)
RDW: 12.6 % (ref 11.7–15.4)
WBC: 5.3 10*3/uL (ref 3.4–10.8)

## 2023-09-12 LAB — TSH: TSH: 3.07 u[IU]/mL (ref 0.450–4.500)

## 2023-09-12 LAB — MAGNESIUM: Magnesium: 1.6 mg/dL (ref 1.6–2.3)

## 2023-09-24 ENCOUNTER — Ambulatory Visit: Admitting: Student

## 2023-09-24 ENCOUNTER — Telehealth: Payer: Self-pay

## 2023-09-24 NOTE — Telephone Encounter (Signed)
 Rec'd 4 boxes of Ozempic  1mg  dose pens.

## 2023-10-01 DIAGNOSIS — N200 Calculus of kidney: Secondary | ICD-10-CM | POA: Diagnosis not present

## 2023-10-01 NOTE — Telephone Encounter (Signed)
Shipment given to patient.

## 2023-10-09 DIAGNOSIS — Z8542 Personal history of malignant neoplasm of other parts of uterus: Secondary | ICD-10-CM | POA: Diagnosis not present

## 2023-10-09 DIAGNOSIS — N200 Calculus of kidney: Secondary | ICD-10-CM | POA: Diagnosis not present

## 2023-10-09 DIAGNOSIS — Z9049 Acquired absence of other specified parts of digestive tract: Secondary | ICD-10-CM | POA: Diagnosis not present

## 2023-10-23 DIAGNOSIS — R300 Dysuria: Secondary | ICD-10-CM | POA: Diagnosis not present

## 2023-10-23 DIAGNOSIS — N200 Calculus of kidney: Secondary | ICD-10-CM | POA: Diagnosis not present

## 2023-11-15 DIAGNOSIS — N200 Calculus of kidney: Secondary | ICD-10-CM | POA: Diagnosis not present

## 2023-11-19 ENCOUNTER — Other Ambulatory Visit: Payer: Self-pay

## 2023-11-19 DIAGNOSIS — E118 Type 2 diabetes mellitus with unspecified complications: Secondary | ICD-10-CM

## 2023-11-19 MED ORDER — METFORMIN HCL 500 MG PO TABS: 500.0000 mg | ORAL_TABLET | Freq: Two times a day (BID) | ORAL | 1 refills | Status: AC

## 2023-11-22 DIAGNOSIS — R311 Benign essential microscopic hematuria: Secondary | ICD-10-CM | POA: Diagnosis not present

## 2023-11-22 DIAGNOSIS — N201 Calculus of ureter: Secondary | ICD-10-CM | POA: Diagnosis not present

## 2023-11-25 ENCOUNTER — Other Ambulatory Visit: Payer: Self-pay

## 2023-11-25 DIAGNOSIS — E890 Postprocedural hypothyroidism: Secondary | ICD-10-CM

## 2023-11-25 DIAGNOSIS — F411 Generalized anxiety disorder: Secondary | ICD-10-CM

## 2023-11-25 MED ORDER — ALPRAZOLAM 0.5 MG PO TABS: 0.5000 mg | ORAL_TABLET | Freq: Every day | ORAL | 0 refills | Status: DC | PRN

## 2023-11-25 MED ORDER — LEVOTHYROXINE SODIUM 75 MCG PO TABS: 75.0000 ug | ORAL_TABLET | Freq: Every day | ORAL | 0 refills | Status: AC

## 2023-11-28 ENCOUNTER — Ambulatory Visit: Payer: Medicare Other

## 2023-11-28 VITALS — Ht 66.0 in | Wt 182.0 lb

## 2023-11-28 DIAGNOSIS — Z0000 Encounter for general adult medical examination without abnormal findings: Secondary | ICD-10-CM

## 2023-11-28 NOTE — Patient Instructions (Signed)
 Ms. Robotham , Thank you for taking time out of your busy schedule to complete your Annual Wellness Visit with me. I enjoyed our conversation and look forward to speaking with you again next year. I, as well as your care team,  appreciate your ongoing commitment to your health goals. Please review the following plan we discussed and let me know if I can assist you in the future. Your Game plan/ To Do List    Referrals: If you haven't heard from the office you've been referred to, please reach out to them at the phone provided.   Follow up Visits: We will see or speak with you next year for your Next Medicare AWV with our clinical staff Have you seen your provider in the last 6 months (3 months if uncontrolled diabetes)? Yes  Clinician Recommendations:  Aim for 30 minutes of exercise or brisk walking, 6-8 glasses of water, and 5 servings of fruits and vegetables each day.       This is a list of the screenings recommended for you:  Health Maintenance  Topic Date Due   Colon Cancer Screening  Never done   Zoster (Shingles) Vaccine (1 of 2) Never done   Mammogram  01/08/2011   DEXA scan (bone density measurement)  Never done   COVID-19 Vaccine (8 - 2024-25 season) 06/16/2023   Complete foot exam   09/10/2023   Flu Shot  11/01/2023   Eye exam for diabetics  01/15/2024   Hemoglobin A1C  03/11/2024   DTaP/Tdap/Td vaccine (4 - Td or Tdap) 06/11/2024   Yearly kidney health urinalysis for diabetes  09/09/2024   Yearly kidney function blood test for diabetes  09/10/2024   Medicare Annual Wellness Visit  11/27/2024   Pneumococcal Vaccine for age over 46  Completed   Hepatitis C Screening  Completed   HPV Vaccine  Aged Out   Meningitis B Vaccine  Aged Out    Advanced directives: (Declined) Advance directive discussed with you today. Even though you declined this today, please call our office should you change your mind, and we can give you the proper paperwork for you to fill out. Advance  Care Planning is important because it:  [x]  Makes sure you receive the medical care that is consistent with your values, goals, and preferences  [x]  It provides guidance to your family and loved ones and reduces their decisional burden about whether or not they are making the right decisions based on your wishes.  Follow the link provided in your after visit summary or read over the paperwork we have mailed to you to help you started getting your Advance Directives in place. If you need assistance in completing these, please reach out to us  so that we can help you!  See attachments for Preventive Care and Fall Prevention Tips.

## 2023-11-28 NOTE — Progress Notes (Signed)
 Because this visit was a virtual/telehealth visit,  certain criteria was not obtained, such a blood pressure, CBG if applicable, and timed get up and go. Any medications not marked as taking were not mentioned during the medication reconciliation part of the visit. Any vitals not documented were not able to be obtained due to this being a telehealth visit or patient was unable to self-report a recent blood pressure reading due to a lack of equipment at home via telehealth. Vitals that have been documented are verbally provided by the patient.   Subjective:   Allison [name] is a 69 y.o. who presents for a Medicare Wellness preventive visit.  As a reminder, Annual Wellness Visits don't include a physical exam, and some assessments may be limited, especially if this visit is performed virtually. We may recommend an in-person follow-up visit with your provider if needed.  Visit Complete: Virtual I connected with  Allison [name] on 11/28/23 by a audio enabled telemedicine application and verified that I am speaking with the correct person using two identifiers.  Patient Location: Home  Provider Location: Home Office  I discussed the limitations of evaluation and management by telemedicine. The patient expressed understanding and agreed to proceed.  Vital Signs: Because this visit was a virtual/telehealth visit, some criteria may be missing or patient reported. Any vitals not documented were not able to be obtained and vitals that have been documented are patient reported.  VideoDeclined- This patient declined Librarian, academic. Therefore the visit was completed with audio only.  Persons Participating in Visit: Patient.  AWV Questionnaire: No: Patient Medicare AWV questionnaire was not completed prior to this visit.  Cardiac Risk Factors include: advanced age (>19men, >64 women);sedentary lifestyle;diabetes mellitus;dyslipidemia;hypertension     Objective:     Today's Vitals   11/28/23 0832  Weight: 182 lb (82.6 kg)  Height: 5' 6 (1.676 m)  PainSc: 0-No pain   Body mass index is 29.38 kg/m.     11/28/2023    8:35 AM 11/23/2022    8:50 AM 07/16/2022    8:54 AM 04/12/2022    9:00 AM 02/27/2022   11:38 AM 02/16/2022    8:32 AM 02/09/2022   10:40 AM  Advanced Directives  Does Patient Have a Medical Advance Directive? No No No No No No No  Would patient like information on creating a medical advance directive? No - Patient declined Yes (MAU/Ambulatory/Procedural Areas - Information given) Yes (MAU/Ambulatory/Procedural Areas - Information given) No - Patient declined No - Patient declined No - Patient declined No - Patient declined    Current Medications (verified) Outpatient Encounter Medications as of 11/28/2023  Medication Sig   Accu-Chek Softclix Lancets lancets Use to check blood sugar 4x per day. E11.9   albuterol  (VENTOLIN  HFA) 108 (90 Base) MCG/ACT inhaler Inhale 2-4 puffs into the lungs every 4 (four) hours as needed for wheezing (or cough).   ALPRAZolam  (XANAX ) 0.5 MG tablet Take 1 tablet (0.5 mg total) by mouth daily as needed for anxiety.   Blood Glucose Monitoring Suppl (ACCU-CHEK GUIDE ME) w/Device KIT USE UP TO 4 TIMES DAILY AS DIRECTED   cetirizine  (ZYRTEC ) 10 MG tablet Take 1 tablet (10 mg total) by mouth daily.   dapagliflozin  propanediol (FARXIGA ) 5 MG TABS tablet Take 1 tablet (5 mg total) by mouth daily.   glucose blood test strip Use as instructed   levothyroxine  (SYNTHROID ) 75 MCG tablet Take 1 tablet (75 mcg total) by mouth daily before breakfast.  metFORMIN  (GLUCOPHAGE ) 500 MG tablet Take 1 tablet (500 mg total) by mouth 2 (two) times daily.   olmesartan  (BENICAR ) 5 MG tablet Take 1 tablet (5 mg total) by mouth at bedtime.   pravastatin  (PRAVACHOL ) 40 MG tablet Take 1 tablet (40 mg total) by mouth every evening.   Semaglutide , 1 MG/DOSE, 4 MG/3ML SOPN Inject 1 mg into the skin once a week.   [DISCONTINUED]  albuterol  (PROVENTIL ,VENTOLIN ) 90 MCG/ACT inhaler Inhale 2 puffs into the lungs every 6 (six) hours as needed.   No facility-administered encounter medications on file as of 11/28/2023.    Allergies (verified) Crestor  [rosuvastatin ], Losartan , Oxycodone , Simvastatin, and Tramadol hcl   History: Past Medical History:  Diagnosis Date   Anxiety    Arthritis    knees (06/04/2016)   Asthma    seasonal   Chronic bronchitis (HCC)    Dysuria 05/08/2010   Qualifier: Diagnosis of   By: Gerlene NP, De[place]         History of anemia    History of gout    History of kidney stones    Hyperlipidemia    Hypertension    Doctor discontinued medication after weight loss   Hypothyroidism    Nephrolithiasis 10/18/2011   Pneumonia ~ 2013; 09/2015; 03/2016   Pulmonary embolism (HCC) ~ 2013   2 big ones,    Thyroid  goiter    Type II diabetes mellitus (HCC)    Uric acid renal calculus 11/24/2012   Recurrent nephrolithiasis. Stone on right side. Last seen by urology on 02/04/17 and started on potassium citrate  15 meq twice a day. Supposed to follow up in 3 months   Uterine cancer (HCC) 2000   Hysterectomy   Ventral hernia    Past Surgical History:  Procedure Laterality Date   CHOLECYSTECTOMY N/A 06/04/2016   Procedure: LAPAROSCOPIC CHOLECYSTECTOMY;  Surgeon: Donnice Bury, MD;  Location: [facility] OR;  Service: General;  Laterality: N/A;  LAPAROSCOPIC CHOLECYSTECTOMY   CYSTOSCOPY W/ RETROGRADES Bilateral 12/18/2016   Procedure: CYSTOSCOPY WITH RETROGRADE PYELOGRAM;  Surgeon: Chauncey Redell Agent, MD;  Location: [facility];  Service: Urology;  Laterality: Bilateral;   CYSTOSCOPY W/ URETERAL STENT REMOVAL Right 12/18/2016   Procedure: CYSTOSCOPY WITH STENT REMOVAL;  Surgeon: Chauncey Redell Agent, MD;  Location: [facility];  Service: Urology;  Laterality: Right;   CYSTOSCOPY WITH RETROGRADE PYELOGRAM, URETEROSCOPY AND STENT PLACEMENT Right 04/17/2016   Procedure: CYSTOSCOPY WITH  RETROGRADE PYELOGRAM, URETEROSCOPY AND STENT PLACEMENT;  Surgeon: Redell Agent Chauncey, MD;  Location: [facility];  Service: Urology;  Laterality: Right;   CYSTOSCOPY/URETEROSCOPY/HOLMIUM LASER/STENT PLACEMENT Bilateral 12/18/2016   Procedure: CYSTOSCOPY/URETEROSCOPY/HOLMIUM LASER/    BILATERAL STENT PLACEMENT;  Surgeon: Chauncey Redell Agent, MD;  Location: [facility];  Service: Urology;  Laterality: Bilateral;   DILATION AND CURETTAGE OF UTERUS     S/P miscarriage   HOLMIUM LASER APPLICATION Bilateral 12/18/2016   Procedure: HOLMIUM LASER APPLICATION;  Surgeon: Chauncey Redell Agent, MD;  Location: [facility];  Service: Urology;  Laterality: Bilateral;   IR URETERAL STENT PLACEMENT EXISTING ACCESS RIGHT  10/31/2020   IR URETERAL STENT RIGHT NEW ACCESS W/O SEP NEPHROSTOMY CATH  11/20/2016   LAPAROSCOPIC CHOLECYSTECTOMY  06/04/2016   NEPHROLITHOTOMY Right 11/20/2016   Procedure: NEPHROLITHOTOMY PERCUTANEOUS;  Surgeon: Chauncey Redell Agent, MD;  Location: [facility];  Service: Urology;  Laterality: Right;   NEPHROLITHOTOMY Right 10/31/2020   Procedure: RIGHT NEPHROLITHOTOMY PERCUTANEOUS, INSERTION OF RIGHT URETERAL STENT;  Surgeon: Carolee Sherwood JONETTA DOUGLAS, MD;  Location: [facility];  Service: Urology;  Laterality: Right;  REQUESTING 2 HRS   TONSILLECTOMY     TOTAL ABDOMINAL HYSTERECTOMY  2000s   endometrial CA   TOTAL THYROIDECTOMY  2012   had goiter on it-one lobe removed   Family History  Adopted: Yes  Problem Relation Age of Onset   CAD Brother 31   Social History   Socioeconomic History   Marital status: Married    Spouse name: Not on file   Number of children: 2   Years of education: Not on file   Highest education level: Not on file  Occupational History   Not on file  Tobacco Use   Smoking status: Never   Smokeless tobacco: Never  Vaping Use   Vaping status: Not on file  Substance and Sexual Activity   Alcohol use: No   Drug use: No   Sexual activity: Never     Birth control/protection: Surgical  Other Topics Concern   Not on file  Social History Narrative   Patient cares for 2 grand children while her daughter is at work. This limits her ability to have appointments.   Social Drivers of Corporate investment banker Strain: Low Risk  (11/28/2023)   Overall Financial Resource Strain (CARDIA)    Difficulty of Paying Living Expenses: Not hard at all  Food Insecurity: No Food Insecurity (11/28/2023)   Hunger Vital Sign    Worried About Running Out of Food in the Last Year: Never true    Ran Out of Food in the Last Year: Never true  Transportation Needs: No Transportation Needs (11/28/2023)   PRAPARE - Administrator, Civil Service (Medical): No    Lack of Transportation (Non-Medical): No  Physical Activity: Insufficiently Active (11/28/2023)   Exercise Vital Sign    Days of Exercise per Week: 3 days    Minutes of Exercise per Session: 30 min  Stress: No Stress Concern Present (11/28/2023)   [employer] of Occupational Health - Occupational Stress Questionnaire    Feeling of Stress: Not at all  Social Connections: Moderately Isolated (11/28/2023)   Social Connection and Isolation Panel    Frequency of Communication with Friends and Family: More than three times a week    Frequency of Social Gatherings with Friends and Family: Three times a week    Attends Religious Services: Never    Active Member of Clubs or Organizations: No    Attends Banker Meetings: Never    Marital Status: Married    Tobacco Counseling Counseling given: Not Answered    Clinical Intake:  Pre-visit preparation completed: Yes  Pain : No/denies pain Pain Score: 0-No pain     BMI - recorded: 29.38 Nutritional Status: BMI 25 -29 Overweight Nutritional Risks: None Diabetes: Yes CBG done?: No Did pt. bring in CBG monitor from home?: No  Lab Results  Component Value Date   HGBA1C 6.2 09/10/2023   HGBA1C 5.9 06/19/2023   HGBA1C 6.2  12/17/2022     How often do you need to have someone help you when you read instructions, pamphlets, or other written materials from your doctor or pharmacy?: 1 - Never  Interpreter Needed?: No  Information entered by :: Alyviah Crandle N. Mendell Bontempo, LPN.   Activities of Daily Living     11/28/2023    8:36 AM  In your present state of health, do you have any difficulty performing the following activities:  Hearing? 0  Vision? 0  Difficulty concentrating or  making decisions? 0  Comment BSE: READING, COLORING, PUZZLES WITH GRANDDAUGHTER  Walking or climbing stairs? 0  Dressing or bathing? 0  Doing errands, shopping? 0  Preparing Food and eating ? N  Using the Toilet? N  In the past six months, have you accidently leaked urine? N  Do you have problems with loss of bowel control? N  Managing your Medications? N  Managing your Finances? N  Housekeeping or managing your Housekeeping? N    Patient Care Team: Gomes, Adriana, DO as PCP - General (Family Medicine) Dingeldein, Elspeth, MD as Consulting Physician (Ophthalmology)  I have updated your Care Teams any recent Medical Services you may have received from other providers in the past year.     Assessment:   This is a routine wellness examination for [place].  Hearing/Vision screen Hearing Screening - Comments:: Denies hearing difficulties.  Vision Screening - Comments:: Wears reading glasses - up to date with routine eye exams with [facility]    Goals Addressed             This Visit's Progress    11/28/2023: To stay healthy and keep walking with my cane.         Depression Screen     11/28/2023    8:38 AM 09/10/2023   10:50 AM 06/19/2023    8:58 AM 11/23/2022    8:48 AM 07/16/2022    8:59 AM 04/12/2022    9:01 AM 02/27/2022   11:39 AM  PHQ 2/9 Scores  PHQ - 2 Score 0 0 0 0 0 1 0  PHQ- 9 Score 4 0 0 0 0  0    Fall Risk     11/28/2023    8:35 AM 09/10/2023   10:45 AM 06/19/2023    8:58 AM 11/23/2022    8:49 AM  07/27/2022   11:08 AM  Fall Risk   Falls in the past year? 0 0 0 0 0  Number falls in past yr: 0  0 0   Injury with Fall? 0  0 0   Risk for fall due to : No Fall Risks   No Fall Risks   Follow up Falls evaluation completed   Falls prevention discussed;Education provided;Falls evaluation completed     MEDICARE RISK AT HOME:  Medicare Risk at Home Any stairs in or around the home?: No If so, are there any without handrails?: No Home free of loose throw rugs in walkways, pet beds, electrical cords, etc?: Yes Adequate lighting in your home to reduce risk of falls?: Yes Life alert?: No Use of a cane, walker or w/c?: Yes (CANE) Grab bars in the bathroom?: Yes Shower chair or bench in shower?: No Elevated toilet seat or a handicapped toilet?: Yes  TIMED UP AND GO:  Was the test performed?  No  Cognitive Function: Declined/Normal: No cognitive concerns noted by patient or family. Patient alert, oriented, able to answer questions appropriately and recall recent events. No signs of memory loss or confusion.    11/28/2023    8:38 AM  MMSE - Mini Mental State Exam  Not completed: Unable to complete        11/28/2023    8:48 AM 11/23/2022    8:50 AM  6CIT Screen  What Year? 0 points 0 points  What month? 0 points 0 points  What time? 0 points 0 points  Count back from 20 0 points 0 points  Months in reverse 0 points 0 points  Repeat phrase  0 points 0 points  Total Score 0 points 0 points    Immunizations Immunization History  Administered Date(s) Administered   Fluad Quad(high Dose 65+) 02/02/2022   Fluad Trivalent(High Dose 65+) 12/17/2022   H1N1 03/09/2008   Influenza Split 12/25/2010, 12/21/2011   Influenza Whole 01/17/2010   Influenza, Seasonal, Injecte, Preservative Fre 11/27/2014   Influenza,inj,Quad PF,6+ Mos 11/28/2012, 12/19/2013, 12/02/2015, 01/20/2016, 12/28/2017   Influenza-Unspecified 12/31/2016, 12/15/2017, 12/29/2019, 01/24/2021   PFIZER Comirnaty(Gray  Top)Covid-19 Tri-Sucrose Vaccine 08/15/2020   PFIZER(Purple Top)SARS-COV-2 Vaccination 08/22/2019, 09/12/2019, 03/24/2020   PNEUMOCOCCAL CONJUGATE-20 04/04/2021   Pfizer Covid-19 Vaccine Bivalent Booster 64yrs & up 02/05/2021   Pfizer(Comirnaty)Fall Seasonal Vaccine 12 years and older 02/02/2022, 12/17/2022   Pneumococcal Polysaccharide-23 06/10/2008, 05/13/2015   Td 06/10/2008   Tdap 02/27/2011, 06/12/2014    Screening Tests Health Maintenance  Topic Date Due   Colonoscopy  Never done   Zoster Vaccines- Shingrix (1 of 2) Never done   MAMMOGRAM  01/08/2011   DEXA SCAN  Never done   COVID-19 Vaccine (8 - 2024-25 season) 06/16/2023   FOOT EXAM  09/10/2023   INFLUENZA VACCINE  11/01/2023   OPHTHALMOLOGY EXAM  01/15/2024   HEMOGLOBIN A1C  03/11/2024   DTaP/Tdap/Td (4 - Td or Tdap) 06/11/2024   Diabetic kidney evaluation - Urine ACR  09/09/2024   Diabetic kidney evaluation - eGFR measurement  09/10/2024   Medicare Annual Wellness (AWV)  11/27/2024   Pneumococcal Vaccine: 50+ Years  Completed   Hepatitis C Screening  Completed   HPV VACCINES  Aged Out   Meningococcal B Vaccine  Aged Out    Health Maintenance  Health Maintenance Due  Topic Date Due   Colonoscopy  Never done   Zoster Vaccines- Shingrix (1 of 2) Never done   MAMMOGRAM  01/08/2011   DEXA SCAN  Never done   COVID-19 Vaccine (8 - 2024-25 season) 06/16/2023   FOOT EXAM  09/10/2023   INFLUENZA VACCINE  11/01/2023   Health Maintenance Items Addressed: Yes Patient aware of current care gaps.  Immunization record was verified by [employer].  Patient is due for the following care gaps: Colonoscopy, DEXA Scan, Mammogram and immunizations.  Additional Screening:  Vision Screening: Recommended annual ophthalmology exams for early detection of glaucoma and other disorders of the eye. Would you like a referral to an eye doctor? Yes    Dental Screening: Recommended annual dental exams for proper oral hygiene  Community  Resource Referral / Chronic Care Management: CRR required this visit?  No   CCM required this visit?  No   Plan:    I have personally reviewed and noted the following in the patient's chart:   Medical and social history Use of alcohol, tobacco or illicit drugs  Current medications and supplements including opioid prescriptions. Patient is not currently taking opioid prescriptions. Functional ability and status Nutritional status Physical activity Advanced directives List of other physicians Hospitalizations, surgeries, and ER visits in previous 12 months Vitals Screenings to include cognitive, depression, and falls Referrals and appointments  In addition, I have reviewed and discussed with patient certain preventive protocols, quality metrics, and best practice recommendations. A written personalized care plan for preventive services as well as general preventive health recommendations were provided to patient.   Roz LOISE Fuller, LPN   1/[id number]/7974   After Visit Summary: (MyChart) Due to this being a telephonic visit, the after visit summary with patients personalized plan was offered to patient via MyChart   Notes: Patient aware of current care gaps.  Immunization record was verified by [employer].  Patient is due for the following care gaps: Colonoscopy, DEXA Scan, Mammogram and immunizations.

## 2023-12-03 ENCOUNTER — Telehealth: Payer: Self-pay

## 2023-12-03 NOTE — Telephone Encounter (Signed)
 In process of completing [employer] refill form for patients OZEMPIC  1MG  DOSE PENS.  In Dr. Serena box

## 2023-12-10 NOTE — Telephone Encounter (Signed)
 Completed form and faxed to [facility].

## 2023-12-17 NOTE — Telephone Encounter (Signed)
 Rec'd 4 Ozempic  1mg  dose pens.  Informed patient.

## 2023-12-19 NOTE — Telephone Encounter (Signed)
 Patient presents to clinic to pick up shipment.   Provided to patient per note from [place].   Allison JAYSON English, RN

## 2023-12-24 NOTE — Progress Notes (Signed)
 Allison [name]                                          MRN: [id number]   12/24/2023   The [facility]BCI Quality Team Specialist reviewed this patient medical record for the purposes of chart review for care gap closure. The following were reviewed: abstraction for care gap closure-controlling blood pressure.    VBCI Quality Team

## 2023-12-25 ENCOUNTER — Encounter: Payer: Self-pay | Admitting: Family Medicine

## 2023-12-27 DIAGNOSIS — N200 Calculus of kidney: Secondary | ICD-10-CM | POA: Diagnosis not present

## 2023-12-27 DIAGNOSIS — N3000 Acute cystitis without hematuria: Secondary | ICD-10-CM | POA: Diagnosis not present

## 2023-12-27 DIAGNOSIS — R300 Dysuria: Secondary | ICD-10-CM | POA: Diagnosis not present

## 2024-01-22 ENCOUNTER — Telehealth: Payer: Self-pay

## 2024-01-22 NOTE — Telephone Encounter (Signed)
   Farxiga  conditionally approved 2026  No Ozempic  assistance for 2026:  Medicare patients will no longer be eligible for Ozempic .

## 2024-01-25 ENCOUNTER — Encounter (HOSPITAL_COMMUNITY): Payer: Self-pay | Admitting: Pharmacy Technician

## 2024-01-25 ENCOUNTER — Other Ambulatory Visit: Payer: Self-pay

## 2024-01-25 ENCOUNTER — Emergency Department (HOSPITAL_COMMUNITY)
Admission: EM | Admit: 2024-01-25 | Discharge: 2024-01-25 | Disposition: A | Discharge status: Home / Self Care | Attending: Emergency Medicine | Admitting: Emergency Medicine

## 2024-01-25 DIAGNOSIS — Z79899 Other long term (current) drug therapy: Secondary | ICD-10-CM | POA: Insufficient documentation

## 2024-01-25 DIAGNOSIS — Z7984 Long term (current) use of oral hypoglycemic drugs: Secondary | ICD-10-CM | POA: Diagnosis not present

## 2024-01-25 DIAGNOSIS — E119 Type 2 diabetes mellitus without complications: Secondary | ICD-10-CM | POA: Insufficient documentation

## 2024-01-25 DIAGNOSIS — R000 Tachycardia, unspecified: Secondary | ICD-10-CM | POA: Diagnosis not present

## 2024-01-25 DIAGNOSIS — R300 Dysuria: Secondary | ICD-10-CM | POA: Diagnosis not present

## 2024-01-25 DIAGNOSIS — I10 Essential (primary) hypertension: Secondary | ICD-10-CM | POA: Diagnosis not present

## 2024-01-25 DIAGNOSIS — N12 Tubulo-interstitial nephritis, not specified as acute or chronic: Secondary | ICD-10-CM | POA: Diagnosis not present

## 2024-01-25 LAB — COMPREHENSIVE METABOLIC PANEL WITH GFR
ALT: 18 U/L (ref 0–44)
AST: 22 U/L (ref 15–41)
Albumin: 3.8 g/dL (ref 3.5–5.0)
Alkaline Phosphatase: 47 U/L (ref 38–126)
Anion gap: 11 (ref 5–15)
BUN: 18 mg/dL (ref 8–23)
CO2: 29 mmol/L (ref 22–32)
Calcium: 8.5 mg/dL — ABNORMAL LOW (ref 8.9–10.3)
Chloride: 97 mmol/L — ABNORMAL LOW (ref 98–111)
Creatinine, Ser: 1.26 mg/dL — ABNORMAL HIGH (ref 0.44–1.00)
GFR, Estimated: 47 mL/min — ABNORMAL LOW (ref >60)
Glucose, Bld: 179 mg/dL — ABNORMAL HIGH (ref 70–99)
Potassium: 3.9 mmol/L (ref 3.5–5.1)
Sodium: 137 mmol/L (ref 135–145)
Total Bilirubin: 1 mg/dL (ref 0.0–1.2)
Total Protein: 7.1 g/dL (ref 6.5–8.1)

## 2024-01-25 LAB — URINALYSIS, ROUTINE W REFLEX MICROSCOPIC
Bilirubin Urine: NEGATIVE
Glucose, UA: >=500 mg/dL — AB
Hgb urine dipstick: NEGATIVE
Ketones, ur: NEGATIVE mg/dL
Nitrite: NEGATIVE
Protein, ur: NEGATIVE mg/dL
Specific Gravity, Urine: 1.012 (ref 1.005–1.030)
pH: 6 (ref 5.0–8.0)

## 2024-01-25 LAB — CBC
HCT: 43.3 % (ref 36.0–46.0)
Hemoglobin: 14.3 g/dL (ref 12.0–15.0)
MCH: 30.8 pg (ref 26.0–34.0)
MCHC: 33 g/dL (ref 30.0–36.0)
MCV: 93.1 fL (ref 80.0–100.0)
Platelets: 169 K/uL (ref 150–400)
RBC: 4.65 MIL/uL (ref 3.87–5.11)
RDW: 13 % (ref 11.5–15.5)
WBC: 4.3 K/uL (ref 4.0–10.5)
nRBC: 0 % (ref 0.0–0.2)

## 2024-01-25 LAB — LIPASE, BLOOD: Lipase: 50 U/L (ref 11–51)

## 2024-01-25 MED ORDER — ONDANSETRON 8 MG PO TBDP: 8.0000 mg | ORAL_TABLET | Freq: Three times a day (TID) | ORAL | 0 refills | Status: AC | PRN

## 2024-01-25 MED ORDER — SULFAMETHOXAZOLE-TRIMETHOPRIM 800-160 MG PO TABS
1.0000 | ORAL_TABLET | Freq: Once | ORAL | Status: AC
  Administered 2024-01-25: 1 via ORAL
  Filled 2024-01-25: qty 1

## 2024-01-25 MED ORDER — SULFAMETHOXAZOLE-TRIMETHOPRIM 800-160 MG PO TABS: 1.0000 | ORAL_TABLET | Freq: Two times a day (BID) | ORAL | 0 refills | Status: AC

## 2024-01-25 MED ORDER — SODIUM CHLORIDE 0.9 % IV SOLN
1.0000 g | Freq: Once | INTRAVENOUS | Status: AC
  Administered 2024-01-25: 1 g via INTRAVENOUS
  Filled 2024-01-25: qty 10

## 2024-01-25 NOTE — ED Triage Notes (Signed)
 Pt here with reports of R flank pain and back pain onset last night. Pt endorses feeling nauseated for the last few days but has not thrown up.

## 2024-01-25 NOTE — Discharge Instructions (Addendum)
 You were seen in the emergency room for burning with urination, nausea, abdominal pain/back pain.  It appears to us  that most likely you have kidney infection.  Please start taking the antibiotics that are prescribed. Please make sure you finish the course of antibiotics even if you start feeling better midway through the antibiotics  Please return to the ER if your symptoms worsen; you have increased pain, fevers, chills, inability to keep any medications down, confusion. Otherwise see your primary care doctor in 7 to 10 days.

## 2024-01-25 NOTE — ED Provider Notes (Signed)
 [facility] AT [facility] Provider Note   CSN: [id number] Arrival date & time: 01/25/24  9068     Patient presents with: Flank Pain   Allison [name] is a 68 y.o. female.   HPI    68 year old female comes in with chief complaint of burning with urination.  Patient has a history of diabetes, hypertension and surgical history of hysterectomy and cholecystectomy.  She reports that she has felt nauseous and weak for the last week.  However, about 2 days ago she started noticing burning with urination and right sided lower quadrant abdominal pain.  Pain is constant, and located in the right lower abdomen and right back region.  Patient thinks she is having low-grade fevers, but no chills.    Prior to Admission medications   Medication Sig Start Date End Date Taking? Authorizing Provider  ondansetron  (ZOFRAN -ODT) 8 MG disintegrating tablet Take 1 tablet (8 mg total) by mouth every 8 (eight) hours as needed for nausea. 01/25/24  Yes Charlyn Sora, MD  sulfamethoxazole -trimethoprim  (BACTRIM  DS) 800-160 MG tablet Take 1 tablet by mouth 2 (two) times daily for 14 days. 01/25/24 02/08/24 Yes Charlyn Sora, MD  Accu-Chek Softclix Lancets lancets Use to check blood sugar 4x per day. E11.9 09/12/22   Orlando Pond, DO  albuterol  (VENTOLIN  HFA) 108 (90 Base) MCG/ACT inhaler Inhale 2-4 puffs into the lungs every 4 (four) hours as needed for wheezing (or cough). 09/10/23   Orlando Pond, DO  ALPRAZolam  (XANAX ) 0.5 MG tablet Take 1 tablet (0.5 mg total) by mouth daily as needed for anxiety. 11/25/23   Gomes, Adriana, DO  Blood Glucose Monitoring Suppl (ACCU-CHEK GUIDE ME) w/Device KIT USE UP TO 4 TIMES DAILY AS DIRECTED 09/10/22   Orlando Pond, DO  cetirizine  (ZYRTEC ) 10 MG tablet Take 1 tablet (10 mg total) by mouth daily. 02/02/22   Dahbura, Anton, DO  dapagliflozin  propanediol (FARXIGA ) 5 MG TABS tablet Take 1 tablet (5 mg total) by mouth daily. 05/22/23   Joshua Domino, DO  glucose blood test strip Use as instructed 09/10/22   Orlando Pond, DO  levothyroxine  (SYNTHROID ) 75 MCG tablet Take 1 tablet (75 mcg total) by mouth daily before breakfast. 11/25/23   Janna Ferrier, DO  metFORMIN  (GLUCOPHAGE ) 500 MG tablet Take 1 tablet (500 mg total) by mouth 2 (two) times daily. 11/19/23   Gomes, Adriana, DO  olmesartan  (BENICAR ) 5 MG tablet Take 1 tablet (5 mg total) by mouth at bedtime. 06/19/23   Orlando Pond, DO  pravastatin  (PRAVACHOL ) 40 MG tablet Take 1 tablet (40 mg total) by mouth every evening. 04/11/23   Orlando Pond, DO  Semaglutide , 1 MG/DOSE, 4 MG/3ML SOPN Inject 1 mg into the skin once a week. 06/07/23   Orlando Pond, DO  albuterol  (PROVENTIL ,VENTOLIN ) 90 MCG/ACT inhaler Inhale 2 puffs into the lungs every 6 (six) hours as needed. 10/23/10 11/13/11  Jordan, Sarah T, MD    Allergies: Crestor  [rosuvastatin ], Losartan , Oxycodone , Simvastatin, and Tramadol hcl    Review of Systems  All other systems reviewed and are negative.   Updated Vital Signs BP 106/83 (BP Location: Right Arm)   Pulse 71   Temp 97.9 F (36.6 C) (Oral)   Resp 15   SpO2 100%   Physical Exam Vitals and nursing note reviewed.  Constitutional:      Appearance: She is well-developed.  HENT:     Head: Atraumatic.  Cardiovascular:     Rate and Rhythm: Normal rate.  Pulmonary:  Effort: Pulmonary effort is normal.  Abdominal:     Tenderness: There is no guarding or rebound.     Comments: Mild tenderness in the lower quadrants noted. Negative McBurney's and Murphy's  Musculoskeletal:     Cervical back: Normal range of motion and neck supple.  Skin:    General: Skin is warm and dry.  Neurological:     Mental Status: She is alert and oriented to person, place, and time.     (all labs ordered are listed, but only abnormal results are displayed) Labs Reviewed  COMPREHENSIVE METABOLIC PANEL WITH GFR - Abnormal; Notable for the following components:      Result Value    Chloride 97 (*)    Glucose, Bld 179 (*)    Creatinine, Ser 1.26 (*)    Calcium  8.5 (*)    GFR, Estimated 47 (*)    All other components within normal limits  URINALYSIS, ROUTINE W REFLEX MICROSCOPIC - Abnormal; Notable for the following components:   APPearance HAZY (*)    Glucose, UA >=500 (*)    Leukocytes,Ua LARGE (*)    Bacteria, UA RARE (*)    All other components within normal limits  URINE CULTURE  LIPASE, BLOOD  CBC    EKG: None  Radiology: No results found.   Procedures   Medications Ordered in the ED  cefTRIAXone  (ROCEPHIN ) 1 g in sodium chloride  0.9 % 100 mL IVPB (0 g Intravenous Stopped 01/25/24 1333)  sulfamethoxazole -trimethoprim  (BACTRIM  DS) 800-160 MG per tablet 1 tablet (1 tablet Oral Given 01/25/24 1250)                                    Medical Decision Making Amount and/or Complexity of Data Reviewed Labs: ordered.  Risk Prescription drug management.   68 year old patient comes in with chief complaint of nausea, back pain, abdominal pain and malaise.  Patient has pertinent past medical history of diabetes, surgical history of cholecystectomy and hysterectomy.  On exam, patient primarily has lower quadrant tenderness, but no rebound or guarding and she does not have McBurney's.  No peritonitis.  Differential diagnosis considered for this patient includes acute UTI, pyelonephritis, kidney stone, intra-abdominal abscess, appendicitis.  Based on our initial evaluation, low suspicion for stone, appendicitis or intra-abdominal surgical pathology.  Higher suspicion for pyelonephritis.  Basic labs ordered along with UA and culture.  Urine analysis does reveal large leukocytes and pyuria, bacteriuria.  No hematuria noted.  Patient does not have profound leukocytosis or severe electrolyte abnormality or renal failure  Patient's exam was repeated at 1:30 PM.  Exam is unchanged. I discussed with the patient the lab findings and my clinical  concerns.  She is comfortable with outpatient management and she will return to the emergency room if her symptoms get worse.  The patient appears reasonably screened and/or stabilized for discharge and I doubt any other medical condition or other [facility] requiring further screening, evaluation, or treatment in the ED at this time prior to discharge.   Results from the ER workup discussed with the patient face to face and all questions answered to the best of my ability. The patient is safe for discharge with strict return precautions.   Final diagnoses:  Pyelonephritis    ED Discharge Orders          Ordered    sulfamethoxazole -trimethoprim  (BACTRIM  DS) 800-160 MG tablet  2 times daily  01/25/24 1337    ondansetron  (ZOFRAN -ODT) 8 MG disintegrating tablet  Every 8 hours PRN        01/25/24 1337               Charlyn Sora, MD 01/25/24 1350

## 2024-01-25 NOTE — ED Notes (Addendum)
 Pt unable to provide urine sample in triage. Pt has cup and will attempt to go at later time.

## 2024-01-27 LAB — URINE CULTURE

## 2024-02-17 ENCOUNTER — Other Ambulatory Visit: Payer: Self-pay | Admitting: Family Medicine

## 2024-02-17 DIAGNOSIS — F411 Generalized anxiety disorder: Secondary | ICD-10-CM

## 2024-02-17 MED ORDER — ALPRAZOLAM 0.5 MG PO TABS: 0.5000 mg | ORAL_TABLET | Freq: Every day | ORAL | 0 refills | Status: AC | PRN

## 2024-02-17 NOTE — Addendum Note (Signed)
 Addended by: Vishwa Dais C on: 02/17/2024 01:35 PM   Modules accepted: Orders

## 2024-04-06 ENCOUNTER — Other Ambulatory Visit (HOSPITAL_COMMUNITY): Payer: Self-pay

## 2024-04-08 ENCOUNTER — Other Ambulatory Visit (HOSPITAL_COMMUNITY): Payer: Self-pay

## 2024-04-08 NOTE — Telephone Encounter (Signed)
 PAP: Patient assistance re-enrollment application for Farxiga  has been approved by PAP Companies: [employer] until 04/01/25.   Medication should be delivered to: Home.  For further shipping updates, please contact [employer] ([employer]) at [phone].   Patient ID is: [id number]  ___   Please send a new 90 day RX with refills of Farxiga  to [facility] for patients re-enrollment. Thanks!

## 2024-04-17 NOTE — Progress Notes (Signed)
 Allison [name]                                          MRN: [id number]   04/17/2024   The [facility] Quality Team Specialist reviewed this patient medical record for the purposes of chart review for care gap closure. The following were reviewed: chart review for care gap closure-controlling blood pressure.  No newer blood pressure than what was already submitted to [employer] for 09/10/2023.    [facility] Quality Team
